# Patient Record
Sex: Female | Born: 1955 | Race: White | Hispanic: No | Smoking: Former smoker
Health system: Southern US, Community
[De-identification: ages and names within clinical notes are randomized; demographics above are authoritative.]

## PROBLEM LIST (undated history)

## (undated) DIAGNOSIS — D649 Anemia, unspecified: Secondary | ICD-10-CM

## (undated) DIAGNOSIS — M1990 Unspecified osteoarthritis, unspecified site: Secondary | ICD-10-CM

## (undated) DIAGNOSIS — L508 Other urticaria: Secondary | ICD-10-CM

## (undated) DIAGNOSIS — J4540 Moderate persistent asthma, uncomplicated: Secondary | ICD-10-CM

## (undated) DIAGNOSIS — J309 Allergic rhinitis, unspecified: Secondary | ICD-10-CM

## (undated) DIAGNOSIS — N393 Stress incontinence (female) (male): Secondary | ICD-10-CM

## (undated) DIAGNOSIS — G5600 Carpal tunnel syndrome, unspecified upper limb: Secondary | ICD-10-CM

## (undated) DIAGNOSIS — K8050 Calculus of bile duct without cholangitis or cholecystitis without obstruction: Secondary | ICD-10-CM

## (undated) DIAGNOSIS — K219 Gastro-esophageal reflux disease without esophagitis: Secondary | ICD-10-CM

## (undated) DIAGNOSIS — C801 Malignant (primary) neoplasm, unspecified: Secondary | ICD-10-CM

## (undated) DIAGNOSIS — T4145XA Adverse effect of unspecified anesthetic, initial encounter: Secondary | ICD-10-CM

## (undated) DIAGNOSIS — T8859XA Other complications of anesthesia, initial encounter: Secondary | ICD-10-CM

## (undated) DIAGNOSIS — K5730 Diverticulosis of large intestine without perforation or abscess without bleeding: Secondary | ICD-10-CM

## (undated) DIAGNOSIS — I499 Cardiac arrhythmia, unspecified: Secondary | ICD-10-CM

## (undated) DIAGNOSIS — Z85820 Personal history of malignant melanoma of skin: Secondary | ICD-10-CM

## (undated) DIAGNOSIS — Z860100 Personal history of colon polyps, unspecified: Secondary | ICD-10-CM

## (undated) DIAGNOSIS — IMO0002 Reserved for concepts with insufficient information to code with codable children: Secondary | ICD-10-CM

## (undated) DIAGNOSIS — R0600 Dyspnea, unspecified: Secondary | ICD-10-CM

## (undated) DIAGNOSIS — K760 Fatty (change of) liver, not elsewhere classified: Secondary | ICD-10-CM

## (undated) DIAGNOSIS — I209 Angina pectoris, unspecified: Secondary | ICD-10-CM

## (undated) DIAGNOSIS — Z973 Presence of spectacles and contact lenses: Secondary | ICD-10-CM

## (undated) DIAGNOSIS — Z87442 Personal history of urinary calculi: Secondary | ICD-10-CM

## (undated) DIAGNOSIS — I10 Essential (primary) hypertension: Secondary | ICD-10-CM

## (undated) DIAGNOSIS — N6019 Diffuse cystic mastopathy of unspecified breast: Secondary | ICD-10-CM

## (undated) DIAGNOSIS — Z9889 Other specified postprocedural states: Secondary | ICD-10-CM

## (undated) DIAGNOSIS — T7840XA Allergy, unspecified, initial encounter: Secondary | ICD-10-CM

## (undated) HISTORY — DX: Diffuse cystic mastopathy of unspecified breast: N60.19

## (undated) HISTORY — DX: Gastro-esophageal reflux disease without esophagitis: K21.9

## (undated) HISTORY — DX: Anemia, unspecified: D64.9

## (undated) HISTORY — DX: Allergy, unspecified, initial encounter: T78.40XA

## (undated) HISTORY — PX: KNEE ARTHROSCOPY W/ MENISCECTOMY: SHX1879

## (undated) HISTORY — PX: TOTAL KNEE REVISION: SHX996

## (undated) HISTORY — DX: Fatty (change of) liver, not elsewhere classified: K76.0

## (undated) HISTORY — PX: BREAST EXCISIONAL BIOPSY: SUR124

## (undated) HISTORY — PX: TUBAL LIGATION: SHX77

## (undated) HISTORY — PX: JOINT REPLACEMENT: SHX530

## (undated) HISTORY — PX: BREAST SURGERY: SHX581

---
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
## 1986-05-27 HISTORY — PX: VAGINAL HYSTERECTOMY: SUR661

## 1994-05-27 DIAGNOSIS — N6019 Diffuse cystic mastopathy of unspecified breast: Secondary | ICD-10-CM

## 1994-05-27 HISTORY — DX: Diffuse cystic mastopathy of unspecified breast: N60.19

## 1999-06-01 ENCOUNTER — Encounter: Payer: Self-pay | Admitting: Family Medicine

## 1999-06-01 ENCOUNTER — Encounter: Admission: RE | Admit: 1999-06-01 | Discharge: 1999-06-01 | Payer: Self-pay | Admitting: *Deleted

## 1999-08-31 ENCOUNTER — Encounter: Admission: RE | Admit: 1999-08-31 | Discharge: 1999-08-31 | Payer: Self-pay | Admitting: *Deleted

## 1999-09-28 ENCOUNTER — Encounter: Admission: RE | Admit: 1999-09-28 | Discharge: 1999-09-28 | Payer: Self-pay | Admitting: *Deleted

## 1999-10-02 ENCOUNTER — Ambulatory Visit (HOSPITAL_BASED_OUTPATIENT_CLINIC_OR_DEPARTMENT_OTHER): Admission: RE | Admit: 1999-10-02 | Discharge: 1999-10-02 | Payer: Self-pay | Admitting: *Deleted

## 2000-10-21 ENCOUNTER — Encounter: Payer: Self-pay | Admitting: Family Medicine

## 2000-10-21 ENCOUNTER — Encounter: Admission: RE | Admit: 2000-10-21 | Discharge: 2000-10-21 | Payer: Self-pay | Admitting: Family Medicine

## 2001-10-26 ENCOUNTER — Encounter: Payer: Self-pay | Admitting: Family Medicine

## 2001-10-26 ENCOUNTER — Encounter: Admission: RE | Admit: 2001-10-26 | Discharge: 2001-10-26 | Payer: Self-pay | Admitting: Family Medicine

## 2002-06-30 ENCOUNTER — Encounter: Payer: Self-pay | Admitting: Family Medicine

## 2002-06-30 ENCOUNTER — Ambulatory Visit (HOSPITAL_COMMUNITY): Admission: RE | Admit: 2002-06-30 | Discharge: 2002-06-30 | Payer: Self-pay | Admitting: Family Medicine

## 2003-09-09 ENCOUNTER — Ambulatory Visit (HOSPITAL_COMMUNITY): Admission: RE | Admit: 2003-09-09 | Discharge: 2003-09-09 | Payer: Self-pay | Admitting: Family Medicine

## 2004-05-02 ENCOUNTER — Ambulatory Visit (HOSPITAL_COMMUNITY): Admission: RE | Admit: 2004-05-02 | Discharge: 2004-05-02 | Payer: Self-pay | Admitting: Family Medicine

## 2004-10-08 ENCOUNTER — Encounter: Admission: RE | Admit: 2004-10-08 | Discharge: 2004-10-08 | Payer: Self-pay | Admitting: Family Medicine

## 2004-10-17 ENCOUNTER — Encounter: Admission: RE | Admit: 2004-10-17 | Discharge: 2004-10-17 | Payer: Self-pay | Admitting: Family Medicine

## 2004-12-28 ENCOUNTER — Encounter: Admission: RE | Admit: 2004-12-28 | Discharge: 2004-12-28 | Payer: Self-pay | Admitting: Family Medicine

## 2005-01-02 ENCOUNTER — Ambulatory Visit (HOSPITAL_COMMUNITY): Admission: RE | Admit: 2005-01-02 | Discharge: 2005-01-02 | Payer: Self-pay | Admitting: Family Medicine

## 2005-01-24 ENCOUNTER — Ambulatory Visit: Payer: Self-pay | Admitting: Orthopedic Surgery

## 2005-05-06 ENCOUNTER — Ambulatory Visit: Payer: Self-pay | Admitting: Orthopedic Surgery

## 2005-07-04 ENCOUNTER — Ambulatory Visit: Payer: Self-pay | Admitting: Orthopedic Surgery

## 2005-07-11 ENCOUNTER — Ambulatory Visit: Payer: Self-pay | Admitting: Internal Medicine

## 2005-07-11 ENCOUNTER — Ambulatory Visit (HOSPITAL_COMMUNITY): Admission: RE | Admit: 2005-07-11 | Discharge: 2005-07-11 | Payer: Self-pay | Admitting: Internal Medicine

## 2005-07-11 ENCOUNTER — Ambulatory Visit: Payer: Self-pay | Admitting: Orthopedic Surgery

## 2005-07-17 ENCOUNTER — Encounter (HOSPITAL_COMMUNITY): Admission: RE | Admit: 2005-07-17 | Discharge: 2005-08-16 | Payer: Self-pay | Admitting: Internal Medicine

## 2005-07-17 ENCOUNTER — Ambulatory Visit: Payer: Self-pay | Admitting: Orthopedic Surgery

## 2005-08-08 ENCOUNTER — Ambulatory Visit (HOSPITAL_COMMUNITY): Admission: RE | Admit: 2005-08-08 | Discharge: 2005-08-08 | Payer: Self-pay | Admitting: Internal Medicine

## 2005-08-08 ENCOUNTER — Ambulatory Visit: Payer: Self-pay | Admitting: Internal Medicine

## 2005-09-03 ENCOUNTER — Emergency Department (HOSPITAL_COMMUNITY): Admission: EM | Admit: 2005-09-03 | Discharge: 2005-09-03 | Payer: Self-pay | Admitting: Emergency Medicine

## 2005-09-26 ENCOUNTER — Ambulatory Visit: Payer: Self-pay | Admitting: Orthopedic Surgery

## 2006-08-18 ENCOUNTER — Ambulatory Visit (HOSPITAL_COMMUNITY): Admission: RE | Admit: 2006-08-18 | Discharge: 2006-08-18 | Payer: Self-pay | Admitting: Family Medicine

## 2006-11-03 ENCOUNTER — Ambulatory Visit: Payer: Self-pay | Admitting: Orthopedic Surgery

## 2006-12-04 ENCOUNTER — Ambulatory Visit: Payer: Self-pay | Admitting: Orthopedic Surgery

## 2006-12-08 ENCOUNTER — Ambulatory Visit (HOSPITAL_COMMUNITY): Admission: RE | Admit: 2006-12-08 | Discharge: 2006-12-08 | Payer: Self-pay | Admitting: Orthopedic Surgery

## 2006-12-17 ENCOUNTER — Ambulatory Visit: Payer: Self-pay | Admitting: Orthopedic Surgery

## 2006-12-19 ENCOUNTER — Ambulatory Visit (HOSPITAL_COMMUNITY): Admission: RE | Admit: 2006-12-19 | Discharge: 2006-12-19 | Payer: Self-pay | Admitting: Orthopedic Surgery

## 2006-12-19 ENCOUNTER — Ambulatory Visit: Payer: Self-pay | Admitting: Orthopedic Surgery

## 2006-12-23 ENCOUNTER — Ambulatory Visit: Payer: Self-pay | Admitting: Orthopedic Surgery

## 2006-12-23 ENCOUNTER — Encounter (HOSPITAL_COMMUNITY): Admission: RE | Admit: 2006-12-23 | Discharge: 2007-01-22 | Payer: Self-pay | Admitting: Orthopedic Surgery

## 2007-01-12 ENCOUNTER — Ambulatory Visit: Payer: Self-pay | Admitting: Orthopedic Surgery

## 2007-01-19 ENCOUNTER — Ambulatory Visit: Payer: Self-pay | Admitting: Orthopedic Surgery

## 2007-02-02 ENCOUNTER — Ambulatory Visit: Payer: Self-pay | Admitting: Orthopedic Surgery

## 2007-02-02 DIAGNOSIS — IMO0002 Reserved for concepts with insufficient information to code with codable children: Secondary | ICD-10-CM | POA: Insufficient documentation

## 2007-02-02 DIAGNOSIS — M25569 Pain in unspecified knee: Secondary | ICD-10-CM | POA: Insufficient documentation

## 2007-02-05 ENCOUNTER — Encounter (HOSPITAL_COMMUNITY): Admission: RE | Admit: 2007-02-05 | Discharge: 2007-02-24 | Payer: Self-pay | Admitting: Orthopedic Surgery

## 2007-02-11 ENCOUNTER — Ambulatory Visit: Payer: Self-pay | Admitting: Orthopedic Surgery

## 2007-02-23 ENCOUNTER — Ambulatory Visit: Payer: Self-pay | Admitting: Orthopedic Surgery

## 2007-02-24 ENCOUNTER — Encounter: Payer: Self-pay | Admitting: Orthopedic Surgery

## 2007-03-09 ENCOUNTER — Encounter: Payer: Self-pay | Admitting: Orthopedic Surgery

## 2007-03-09 DIAGNOSIS — IMO0002 Reserved for concepts with insufficient information to code with codable children: Secondary | ICD-10-CM | POA: Insufficient documentation

## 2007-03-09 DIAGNOSIS — M1710 Unilateral primary osteoarthritis, unspecified knee: Secondary | ICD-10-CM

## 2007-03-10 ENCOUNTER — Encounter: Payer: Self-pay | Admitting: Orthopedic Surgery

## 2007-03-10 ENCOUNTER — Ambulatory Visit: Payer: Self-pay | Admitting: Orthopedic Surgery

## 2007-03-10 ENCOUNTER — Inpatient Hospital Stay (HOSPITAL_COMMUNITY): Admission: RE | Admit: 2007-03-10 | Discharge: 2007-03-13 | Payer: Self-pay | Admitting: Orthopedic Surgery

## 2007-03-10 HISTORY — PX: TOTAL KNEE ARTHROPLASTY: SHX125

## 2007-03-17 ENCOUNTER — Telehealth: Payer: Self-pay | Admitting: Orthopedic Surgery

## 2007-03-18 ENCOUNTER — Telehealth: Payer: Self-pay | Admitting: Orthopedic Surgery

## 2007-03-24 ENCOUNTER — Ambulatory Visit: Payer: Self-pay | Admitting: Orthopedic Surgery

## 2007-03-24 DIAGNOSIS — M1710 Unilateral primary osteoarthritis, unspecified knee: Secondary | ICD-10-CM | POA: Insufficient documentation

## 2007-03-27 ENCOUNTER — Encounter (HOSPITAL_COMMUNITY): Admission: RE | Admit: 2007-03-27 | Discharge: 2007-04-26 | Payer: Self-pay | Admitting: Orthopedic Surgery

## 2007-03-27 ENCOUNTER — Encounter: Payer: Self-pay | Admitting: Orthopedic Surgery

## 2007-03-31 ENCOUNTER — Encounter: Payer: Self-pay | Admitting: Orthopedic Surgery

## 2007-04-08 ENCOUNTER — Encounter: Payer: Self-pay | Admitting: Orthopedic Surgery

## 2007-04-16 ENCOUNTER — Encounter: Payer: Self-pay | Admitting: Orthopedic Surgery

## 2007-04-21 ENCOUNTER — Encounter: Payer: Self-pay | Admitting: Orthopedic Surgery

## 2007-04-22 ENCOUNTER — Ambulatory Visit: Payer: Self-pay | Admitting: Orthopedic Surgery

## 2007-04-25 ENCOUNTER — Encounter: Payer: Self-pay | Admitting: Orthopedic Surgery

## 2007-04-29 ENCOUNTER — Ambulatory Visit: Admission: RE | Admit: 2007-04-29 | Discharge: 2007-04-29 | Payer: Self-pay | Admitting: Orthopedic Surgery

## 2007-06-03 ENCOUNTER — Ambulatory Visit: Payer: Self-pay | Admitting: Orthopedic Surgery

## 2007-06-25 ENCOUNTER — Ambulatory Visit: Payer: Self-pay | Admitting: Orthopedic Surgery

## 2007-06-25 DIAGNOSIS — Z96659 Presence of unspecified artificial knee joint: Secondary | ICD-10-CM | POA: Insufficient documentation

## 2007-06-29 ENCOUNTER — Ambulatory Visit: Payer: Self-pay | Admitting: Orthopedic Surgery

## 2007-07-09 ENCOUNTER — Ambulatory Visit: Payer: Self-pay | Admitting: Orthopedic Surgery

## 2007-07-09 DIAGNOSIS — IMO0002 Reserved for concepts with insufficient information to code with codable children: Secondary | ICD-10-CM | POA: Insufficient documentation

## 2007-07-10 ENCOUNTER — Encounter: Payer: Self-pay | Admitting: Orthopedic Surgery

## 2007-07-14 ENCOUNTER — Telehealth: Payer: Self-pay | Admitting: Orthopedic Surgery

## 2007-07-20 ENCOUNTER — Encounter: Payer: Self-pay | Admitting: Orthopedic Surgery

## 2007-08-05 ENCOUNTER — Ambulatory Visit: Payer: Self-pay | Admitting: Orthopedic Surgery

## 2007-08-07 ENCOUNTER — Telehealth: Payer: Self-pay | Admitting: Orthopedic Surgery

## 2007-08-11 ENCOUNTER — Encounter: Payer: Self-pay | Admitting: Orthopedic Surgery

## 2007-08-18 ENCOUNTER — Telehealth: Payer: Self-pay | Admitting: Orthopedic Surgery

## 2007-08-28 ENCOUNTER — Telehealth: Payer: Self-pay | Admitting: Orthopedic Surgery

## 2007-08-28 ENCOUNTER — Ambulatory Visit (HOSPITAL_COMMUNITY): Admission: RE | Admit: 2007-08-28 | Discharge: 2007-08-28 | Payer: Self-pay | Admitting: Orthopedic Surgery

## 2007-09-01 ENCOUNTER — Encounter: Payer: Self-pay | Admitting: Orthopedic Surgery

## 2007-09-01 DIAGNOSIS — M5126 Other intervertebral disc displacement, lumbar region: Secondary | ICD-10-CM | POA: Insufficient documentation

## 2007-09-09 ENCOUNTER — Encounter: Admission: RE | Admit: 2007-09-09 | Discharge: 2007-09-09 | Payer: Self-pay | Admitting: Orthopedic Surgery

## 2007-09-23 ENCOUNTER — Encounter: Admission: RE | Admit: 2007-09-23 | Discharge: 2007-09-23 | Payer: Self-pay | Admitting: Orthopedic Surgery

## 2007-10-02 ENCOUNTER — Encounter: Payer: Self-pay | Admitting: Orthopedic Surgery

## 2007-10-05 ENCOUNTER — Ambulatory Visit (HOSPITAL_COMMUNITY): Admission: RE | Admit: 2007-10-05 | Discharge: 2007-10-05 | Payer: Self-pay | Admitting: Neurology

## 2007-10-09 ENCOUNTER — Encounter: Admission: RE | Admit: 2007-10-09 | Discharge: 2007-10-09 | Payer: Self-pay | Admitting: Orthopedic Surgery

## 2007-10-20 ENCOUNTER — Encounter: Payer: Self-pay | Admitting: Orthopedic Surgery

## 2007-10-21 ENCOUNTER — Encounter: Admission: RE | Admit: 2007-10-21 | Discharge: 2007-10-21 | Payer: Self-pay | Admitting: Family Medicine

## 2007-11-04 ENCOUNTER — Ambulatory Visit: Payer: Self-pay | Admitting: Orthopedic Surgery

## 2007-11-06 ENCOUNTER — Encounter: Payer: Self-pay | Admitting: Orthopedic Surgery

## 2007-11-09 ENCOUNTER — Telehealth: Payer: Self-pay | Admitting: Orthopedic Surgery

## 2008-01-15 ENCOUNTER — Ambulatory Visit (HOSPITAL_COMMUNITY): Admission: RE | Admit: 2008-01-15 | Discharge: 2008-01-15 | Payer: Self-pay | Admitting: Family Medicine

## 2008-01-22 ENCOUNTER — Ambulatory Visit (HOSPITAL_COMMUNITY): Admission: RE | Admit: 2008-01-22 | Discharge: 2008-01-22 | Payer: Self-pay | Admitting: Family Medicine

## 2008-03-03 ENCOUNTER — Telehealth: Payer: Self-pay | Admitting: Orthopedic Surgery

## 2008-03-09 ENCOUNTER — Ambulatory Visit: Payer: Self-pay | Admitting: Orthopedic Surgery

## 2008-07-05 ENCOUNTER — Telehealth: Payer: Self-pay | Admitting: Orthopedic Surgery

## 2008-07-13 ENCOUNTER — Encounter: Admission: RE | Admit: 2008-07-13 | Discharge: 2008-07-13 | Payer: Self-pay | Admitting: Orthopedic Surgery

## 2008-07-14 ENCOUNTER — Ambulatory Visit (HOSPITAL_COMMUNITY): Admission: RE | Admit: 2008-07-14 | Discharge: 2008-07-14 | Payer: Self-pay | Admitting: Family Medicine

## 2008-08-12 ENCOUNTER — Encounter: Admission: RE | Admit: 2008-08-12 | Discharge: 2008-08-12 | Payer: Self-pay | Admitting: Orthopedic Surgery

## 2008-08-18 ENCOUNTER — Telehealth: Payer: Self-pay | Admitting: Orthopedic Surgery

## 2008-08-19 ENCOUNTER — Encounter: Payer: Self-pay | Admitting: Orthopedic Surgery

## 2008-09-07 ENCOUNTER — Encounter: Admission: RE | Admit: 2008-09-07 | Discharge: 2008-09-07 | Payer: Self-pay | Admitting: Orthopedic Surgery

## 2008-09-16 ENCOUNTER — Encounter: Payer: Self-pay | Admitting: Orthopedic Surgery

## 2008-09-19 ENCOUNTER — Encounter: Payer: Self-pay | Admitting: Orthopedic Surgery

## 2008-11-08 ENCOUNTER — Encounter: Admission: RE | Admit: 2008-11-08 | Discharge: 2008-11-08 | Payer: Self-pay | Admitting: Family Medicine

## 2008-12-09 ENCOUNTER — Encounter: Payer: Self-pay | Admitting: Orthopedic Surgery

## 2008-12-21 ENCOUNTER — Encounter: Payer: Self-pay | Admitting: Orthopedic Surgery

## 2008-12-22 ENCOUNTER — Telehealth: Payer: Self-pay | Admitting: Orthopedic Surgery

## 2009-01-18 ENCOUNTER — Ambulatory Visit: Payer: Self-pay | Admitting: Orthopedic Surgery

## 2009-01-27 ENCOUNTER — Encounter: Payer: Self-pay | Admitting: Orthopedic Surgery

## 2009-02-07 ENCOUNTER — Encounter: Payer: Self-pay | Admitting: Orthopedic Surgery

## 2009-03-22 ENCOUNTER — Telehealth: Payer: Self-pay | Admitting: Orthopedic Surgery

## 2009-04-19 ENCOUNTER — Ambulatory Visit: Payer: Self-pay | Admitting: Orthopedic Surgery

## 2009-04-19 DIAGNOSIS — M25819 Other specified joint disorders, unspecified shoulder: Secondary | ICD-10-CM | POA: Insufficient documentation

## 2009-04-19 DIAGNOSIS — M25519 Pain in unspecified shoulder: Secondary | ICD-10-CM | POA: Insufficient documentation

## 2009-04-19 DIAGNOSIS — M7580 Other shoulder lesions, unspecified shoulder: Secondary | ICD-10-CM

## 2009-07-07 ENCOUNTER — Ambulatory Visit (HOSPITAL_COMMUNITY): Admission: RE | Admit: 2009-07-07 | Discharge: 2009-07-07 | Payer: Self-pay | Admitting: Family Medicine

## 2009-07-25 ENCOUNTER — Ambulatory Visit (HOSPITAL_COMMUNITY): Admission: RE | Admit: 2009-07-25 | Discharge: 2009-07-25 | Payer: Self-pay | Admitting: Family Medicine

## 2009-08-02 ENCOUNTER — Ambulatory Visit: Payer: Self-pay | Admitting: Orthopedic Surgery

## 2009-10-20 ENCOUNTER — Encounter: Payer: Self-pay | Admitting: Orthopedic Surgery

## 2009-10-27 ENCOUNTER — Encounter: Payer: Self-pay | Admitting: Orthopedic Surgery

## 2009-11-01 ENCOUNTER — Ambulatory Visit: Payer: Self-pay | Admitting: Orthopedic Surgery

## 2009-11-13 ENCOUNTER — Telehealth: Payer: Self-pay | Admitting: Orthopedic Surgery

## 2009-11-29 ENCOUNTER — Encounter: Admission: RE | Admit: 2009-11-29 | Discharge: 2009-11-29 | Payer: Self-pay | Admitting: Family Medicine

## 2009-12-01 ENCOUNTER — Telehealth: Payer: Self-pay | Admitting: Orthopedic Surgery

## 2009-12-01 ENCOUNTER — Encounter: Payer: Self-pay | Admitting: Orthopedic Surgery

## 2010-01-01 ENCOUNTER — Ambulatory Visit: Payer: Self-pay | Admitting: Orthopedic Surgery

## 2010-01-01 DIAGNOSIS — M23302 Other meniscus derangements, unspecified lateral meniscus, unspecified knee: Secondary | ICD-10-CM | POA: Insufficient documentation

## 2010-01-01 DIAGNOSIS — S53106A Unspecified dislocation of unspecified ulnohumeral joint, initial encounter: Secondary | ICD-10-CM | POA: Insufficient documentation

## 2010-01-02 ENCOUNTER — Telehealth: Payer: Self-pay | Admitting: Orthopedic Surgery

## 2010-01-02 ENCOUNTER — Encounter: Payer: Self-pay | Admitting: Orthopedic Surgery

## 2010-01-02 ENCOUNTER — Ambulatory Visit (HOSPITAL_COMMUNITY): Admission: RE | Admit: 2010-01-02 | Discharge: 2010-01-02 | Payer: Self-pay | Admitting: Orthopedic Surgery

## 2010-01-10 ENCOUNTER — Ambulatory Visit: Payer: Self-pay | Admitting: Orthopedic Surgery

## 2010-01-10 DIAGNOSIS — M2340 Loose body in knee, unspecified knee: Secondary | ICD-10-CM | POA: Insufficient documentation

## 2010-01-18 ENCOUNTER — Telehealth: Payer: Self-pay | Admitting: Orthopedic Surgery

## 2010-01-26 ENCOUNTER — Ambulatory Visit: Payer: Self-pay | Admitting: Orthopedic Surgery

## 2010-01-26 ENCOUNTER — Ambulatory Visit (HOSPITAL_COMMUNITY): Admission: RE | Admit: 2010-01-26 | Discharge: 2010-01-26 | Payer: Self-pay | Admitting: Orthopedic Surgery

## 2010-02-01 ENCOUNTER — Encounter (HOSPITAL_COMMUNITY): Admission: RE | Admit: 2010-02-01 | Discharge: 2010-02-23 | Payer: Self-pay | Admitting: Orthopedic Surgery

## 2010-02-01 ENCOUNTER — Ambulatory Visit: Payer: Self-pay | Admitting: Orthopedic Surgery

## 2010-02-15 ENCOUNTER — Ambulatory Visit: Payer: Self-pay | Admitting: Orthopedic Surgery

## 2010-02-15 DIAGNOSIS — M659 Synovitis and tenosynovitis, unspecified: Secondary | ICD-10-CM | POA: Insufficient documentation

## 2010-02-22 ENCOUNTER — Encounter: Payer: Self-pay | Admitting: Orthopedic Surgery

## 2010-02-26 ENCOUNTER — Encounter (HOSPITAL_COMMUNITY): Admission: RE | Admit: 2010-02-26 | Discharge: 2010-03-28 | Payer: Self-pay | Admitting: Orthopedic Surgery

## 2010-02-28 ENCOUNTER — Ambulatory Visit: Payer: Self-pay | Admitting: Orthopedic Surgery

## 2010-03-28 ENCOUNTER — Ambulatory Visit: Payer: Self-pay | Admitting: Orthopedic Surgery

## 2010-03-28 ENCOUNTER — Ambulatory Visit (HOSPITAL_COMMUNITY): Admission: RE | Admit: 2010-03-28 | Discharge: 2010-03-28 | Payer: Self-pay | Admitting: Orthopedic Surgery

## 2010-03-28 ENCOUNTER — Telehealth: Payer: Self-pay | Admitting: Orthopedic Surgery

## 2010-04-03 ENCOUNTER — Encounter: Payer: Self-pay | Admitting: Orthopedic Surgery

## 2010-04-11 ENCOUNTER — Ambulatory Visit: Payer: Self-pay | Admitting: Orthopedic Surgery

## 2010-04-11 ENCOUNTER — Ambulatory Visit (HOSPITAL_COMMUNITY): Admission: RE | Admit: 2010-04-11 | Discharge: 2010-04-11 | Payer: Self-pay | Admitting: Orthopedic Surgery

## 2010-04-11 DIAGNOSIS — M84369A Stress fracture, unspecified tibia and fibula, initial encounter for fracture: Secondary | ICD-10-CM | POA: Insufficient documentation

## 2010-04-12 ENCOUNTER — Telehealth: Payer: Self-pay | Admitting: Orthopedic Surgery

## 2010-04-12 ENCOUNTER — Encounter (INDEPENDENT_AMBULATORY_CARE_PROVIDER_SITE_OTHER): Payer: Self-pay | Admitting: *Deleted

## 2010-04-18 ENCOUNTER — Encounter: Admission: RE | Admit: 2010-04-18 | Discharge: 2010-04-18 | Payer: Self-pay | Admitting: Orthopedic Surgery

## 2010-06-17 ENCOUNTER — Encounter: Payer: Self-pay | Admitting: Family Medicine

## 2010-06-18 ENCOUNTER — Encounter: Payer: Self-pay | Admitting: Family Medicine

## 2010-06-25 ENCOUNTER — Other Ambulatory Visit: Payer: Self-pay | Admitting: Orthopedic Surgery

## 2010-06-25 DIAGNOSIS — M549 Dorsalgia, unspecified: Secondary | ICD-10-CM

## 2010-06-28 NOTE — Assessment & Plan Note (Signed)
Summary: RT KNEE PAIN/?NEW XRAY/MEDICARE(REC'D NEW CARD)/CAF   Visit Type:  Follow-up  CC:  right knee pain.  History of Present Illness: I saw Sharon [name] in the office today for a followup visit.  She is a 55 years old woman with the complaint of:  right knee pain  She has a history of osteoarthritis in that knee she had an injection back in June.  She said it didn't help that much.  Back in 2006 she had a MRI which showed arthritis of the patellofemoral joint no meniscal tear.  She had Synvisc injections in 2007.  She status post LEFT total knee.  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  She has catching and some locking and anterior medial knee pain which is now not relieved by Vicodin or ibuprofen      Allergies: 1)  ! Neosporin  Past History:  Past Medical History: Last updated: 01/18/2009 GERD Pneumonia migraines melanoma  Past Surgical History: Last updated: 01/18/2009 Appendectomy Tonsillectomy HYSTERECTOMY left knee total 2008.  Family History: Last updated: 03/09/2007 FH of Cancer:  Lung Disease  Social History: Last updated: 03/09/2007 Patient is single.  RN a [employer]  Risk Factors: Smoking Status: current (03/09/2007) Packs/Day: 1/2 (03/09/2007)  Review of Systems       chronic back pain  Chronic pain    Physical Exam  Additional Exam:  1. General appearance was normal  2. Oriented x 3  3. Mood was normal  4. Gait was abnormal she was using a cane and limping on the RIGHT leg  RIGHT knee 5. Inspection no swelling medial joint line tenderness inferior patella tenderness, no tenderness behind the knee joint 6. ROM was 5-125 7. Joint stability was normal 8. Motor grade was normal 9. Skin was normal 10. Pulse was normal  11. Sensation was normal  12. Reflexes were normal    Impression & Recommendations:  Problem # 1:  KNEE, ARTHRITIS,  DEGEN./OSTEO (ICD-715.96) Assessment New  Her updated medication list for this problem includes:    Vicodin 5-500 Mg Tabs (Hydrocodone-acetaminophen) ..... One by mouth q 4 hrs as needed pain    Ultram 50 Mg Tabs (Tramadol hcl) .Marland Kitchen... 1-2 by mouth q 4 as needed pain  Orders: Est. Patient Level IV (81191) Knee x-ray,  3 views (47829) RIGHT knee  Moderate joint space narrowing medial osteophytes and patellofemoral arthritis are seen overall alignment slight varus  Impression moderate osteoarthritis  Problem # 2:  KNEE PAIN (FAO-130.86) Assessment: New  Her updated medication list for this problem includes:    Vicodin 5-500 Mg Tabs (Hydrocodone-acetaminophen) ..... One by mouth q 4 hrs as needed pain    Ultram 50 Mg Tabs (Tramadol hcl) .Marland Kitchen... 1-2 by mouth q 4 as needed pain  Orders: Est. Patient Level IV (57846) Knee x-ray,  3 views (96295)  Problem # 3:  DERANGEMENT MENISCUS (ICD-717.5) Assessment: New recommend MRI RIGHT knee for meniscal tear, medial  Patient Instructions: 1)  MRI RIGHT KNEE 2)  F/U AFTER

## 2010-06-28 NOTE — Assessment & Plan Note (Signed)
Summary: REVIEW MRI RESULTS,[facility]/ANKLE/MEDICARE/CAF   Visit Type:  Follow-up  CC:  recheck ankle pain.  History of Present Illness: DX: posterior tibial tendinitis.  Treatment Cam Walker, Vicodin as needed for pain Motrin 800mg  as needed, Physical therapy.  Medications as stated.  Patient complained of pain in her medial leg, and tibial area, and sent for MRI does show she has a stress reaction/stress fracture.  Soft complaint of back pain secondary to RIGHT Cam Walker.  She says her back pain is severe. She had an epidural injection about 2 years ago and last MRI was 2 years ago. His bilateral leg pain radicular in nature. Not responding to anti-inflammatories, ibuprofen or Vicodin.  Today is here for MRI results right lower leg from [facility] 03/28/10. IMPRESSION:   1.  There is no evidence of tendon tear or focal soft tissue abnormality distally in the right lower leg to correspond with the patient's palpable concern.  Pretibial subcutaneous edema appears fairly symmetric in both lower legs. 2.  In the right lower leg, there is marrow edema in the distal tibial diaphysis which could reflect stress reaction or early stress fracture.  No cortical abnormality is apparent. 3.  Asymmetric edema in the right gastrocnemius muscle with associated atrophy, most consistent with subacute denervation. Correlate clinically. 4.  Mildly asymmetric edema surrounding the right anterior talofibular ligament, not optimally evaluated.   Pt note for review in EMR.              Allergies: 1)  ! Neosporin   Impression & Recommendations:  Problem # 1:  TENOSYNOVITIS OF FOOT AND ANKLE (ICD-727.06) Assessment Improved  use Cane left hand x 6-8 weeks   Orders: Est. Patient Level III (04540)  Problem # 2:  STRESS FRACTURE, TIBIA (ICD-733.93) Assessment: New  Orders: Est. Patient Level III (98119)  Patient Instructions: 1)  MRI L spine call results  2)  will arrange ESI after  MRI 3)  Use cane land / for Stress reaction right tibia   Orders Added: 1)  Est. Patient Level III [14782]

## 2010-06-28 NOTE — Miscellaneous (Signed)
Summary: PT clinical evaluation  PT clinical evaluation   Imported By: Jacklynn Ganong 02/22/2010 12:19:29  _____________________________________________________________________  External Attachment:    Type:   Image     Comment:   External Document

## 2010-06-28 NOTE — Letter (Signed)
Summary: [employer] Disability forms  [employer] Disability forms   Imported By: Cammie Sickle 10/31/2009 11:42:15  _____________________________________________________________________  External Attachment:    Type:   Image     Comment:   External Document

## 2010-06-28 NOTE — Assessment & Plan Note (Signed)
Summary: 2 WK RE-CK ANKLE+TENDON/?XRAY/POST OP RT KNEE/ARTHROS 01/26/10/...   Visit Type:  Follow-up  CC:  recheck ankle.  History of Present Illness: I saw Sharon [name] in the office today for a followup visit.  She is a 55 years old woman with the complaint of:  right leg pain  DOS  01-26-10. Arthroscopy of right knee.  Medications: Hydrocodone 7.5 mg and Ibuprofen, no refills needed.  Patient states her ankle started hurting last week and having pain going up  her calf muscle.  ROS: the knee is fine   Today is 2 week recheck on the ankle.TENOSYNOVITIS OF FOOT AND ANKLE (ICD-727.06)  She still has pain right lower inner leg, no pain in the knee,   She is undergoing physical therapy for her RIGHT ankle still having medial tibial and ankle pain  Exam shows the following she ambulates with minimal limp but she has severe tenderness over the medial posterior tibia in the medial arch.  Ankle range of motion is normal.  She has weakness in the posterior tibial tendon.  Ankle is stable his sensation is normal.  Skin is intact      Allergies: 1)  ! Neosporin   Impression & Recommendations:  Problem # 1:  TENOSYNOVITIS OF FOOT AND ANKLE (ICD-727.06) Assessment Unchanged recommend physical therapy and a Cam Walker for one month in followup.  Patient advised to keep her weightbearing to a minimum because the uneven gait caused by the Cam Walker and may cause increased back pain Orders: Est. Patient Level III (16109)  Patient Instructions: 1)  CAM WALKER X 1 MONTH  2)  KEEP YOUR WALKING TO A MINIMUM, DO NOT GO FOR LONG SHOPPING TRIPS OR LONG PERIODS OF STANDING  3)  CONTINUE PT  4)  RETURN IN 1 MONTH

## 2010-06-28 NOTE — Assessment & Plan Note (Signed)
Summary: POST OP 1/RT KNEE/SURG 01/26/10/MEDICARE/CAF   Visit Type:  post op #1  CC:  right knee.  History of Present Illness: I saw Sharon [name] in the office today for a followup visit.  She is a 55 years old woman with the complaint of:  post op #1, right knee  DOS  01-26-10. Arthroscopy of right knee.  Medications: Hydrocodone 7.5 mg and Ibuprofen.  OPERATIVE FINDINGS:  She had a torn medial meniscus.  She had a torn lateral meniscus.  She had grade 4 lesion on the lateral tibial plateau and a grade 3 lesion of the medial femoral condyle, and the trochlea, and a grade 2 lesion of the patella with extensive fissuring.  There was extensive synovitis.  The patient says that her knee is 80% better.  She can move the knee much better she has much less pain no swelling suture line looks good on both portals.  We can take the sutures out today.  She will followup for checkup after therapy which will start tomorrow or today I think it is.  She should continue same medicines.  Allergies: 1)  ! Neosporin   Impression & Recommendations:  Problem # 1:  KNEE, ARTHRITIS, DEGEN./OSTEO (ICD-715.96)  Her updated medication list for this problem includes:    Vicodin 5-500 Mg Tabs (Hydrocodone-acetaminophen) ..... One by mouth q 4 hrs as needed pain    Ultram 50 Mg Tabs (Tramadol hcl) .Marland Kitchen... 1-2 by mouth q 4 as needed pain  Orders: Post-Op Check (52841)  Problem # 2:  DERANGEMENT MENISCUS (ICD-717.5)  Orders: Post-Op Check (32440)  Patient Instructions: 1)  Please schedule a follow-up appointment in 3 weeks.

## 2010-06-28 NOTE — Progress Notes (Signed)
Summary: additiional [employer] form to be filled out  Phone Note Call from Patient   Caller: Patient Summary of Call: Per phone and new form rec'd 11/09/09 per patient, [employer] did not accept the standard disability form by [employer], our forms provider.   Appointment has therefore been scheduled for patient to have re-eval by Dr Romeo Apple for completion of requested form, per patient's claims examiner Carmelina Peal. Per Natalia Leatherwood, form needed before 12/08/09, for the month of July; states she has told patient she's been appr'd for month of June. Initial call taken by: Cammie Sickle,  November 13, 2009 10:27 AM

## 2010-06-28 NOTE — Progress Notes (Signed)
Summary: Referral to [facility] for ESI's.  Phone Note Outgoing Call   Call placed by: Waldon Reining,  April 12, 2010 4:48 PM Call placed to: Specialist Action Taken: Information Sent Summary of Call: I faxed a referral for this patient to [facility] for ESI's at L5-S1.

## 2010-06-28 NOTE — Miscellaneous (Signed)
Summary: PT progress note  PT progress note   Imported By: Jacklynn Ganong 02/22/2010 15:56:51  _____________________________________________________________________  External Attachment:    Type:   Image     Comment:   External Document

## 2010-06-28 NOTE — Progress Notes (Signed)
Summary: no pre-cert required for out-patient surgery  Phone Note Outgoing Call   Summary of Call: Per Medicare, no pre-authorization required for out-pt surgery, CPT 29881 scheduled 9/2/11at [facility]. Initial call taken by: Cammie Sickle,  January 18, 2010 3:47 PM

## 2010-06-28 NOTE — Progress Notes (Signed)
Summary: MRI appointment.  Phone Note Outgoing Call   Call placed by: Waldon Reining,  January 02, 2010 10:37 AM Call placed to: Patient Action Taken: Appt scheduled Summary of Call: I called to give the patient her MRI appointment at [facility] on 01-03-10 at 1:30. Patient has Medicare, no precert is needed. Patient will follow up here for results.

## 2010-06-28 NOTE — Miscellaneous (Signed)
Summary: L5-S1 ESI order  Clinical Lists Changes  Orders: Added new Referral order of Misc. Referral (Misc. Ref) - Signed

## 2010-06-28 NOTE — Assessment & Plan Note (Signed)
Summary: 1 M RE-CHECK RT ANKLE/MEDICARE/CAF   Visit Type:  Follow-up  CC:  recheck rt ankle.  History of Present Illness: DX:one posterior tibial tendinitis.  Treatment Cam Walker, Vicodin as needed for pain. Pain as well. Physical therapy.  Medications as stated.  Complaint medial leg pain, tibialis anterior, and medial lower leg.  Today, skilled for recheck and review of PT notes.  PT notes state that the patient has improved somewhat, but the patient still complains of significant pain.  Recommend MRI RIGHT leg.            Allergies: 1)  ! Neosporin  Physical Exam  Additional Exam:  RIGHT leg is tender over the medial posterior tibia, and soft tissues, including the tibialis anterior, mainly involving the distal 3rd of the leg.  She is having some bilateral ankle edema, which is nonpitting.     Impression & Recommendations:  Problem # 1:  TENOSYNOVITIS OF FOOT AND ANKLE (ICD-727.06) Assessment Unchanged  MRI of the RIGHT leg  Orders: Est. Patient Level II (29562)  Patient Instructions: 1)  MRI right leg  2)  Lets take the boot off  Prescriptions: VICODIN 5-500 MG TABS (HYDROCODONE-ACETAMINOPHEN) one by mouth q 4 hrs as needed pain  #84 x 5   Entered and Authorized by:   Fuller Canada MD   Signed by:   Fuller Canada MD on 03/28/2010   Method used:   Print then Give to Patient   RxID:   [id number]    Orders Added: 1)  Est. Patient Level II [84132]

## 2010-06-28 NOTE — Assessment & Plan Note (Signed)
Summary: 3 M RE-CK KNEE FOL'G INJEC/SELF PAY/CAF   Visit Type:  Follow-up  CC:  right knee pain .  History of Present Illness: I saw Sharon [name] in the office today for a followup visit.  She is a 55 years old woman with the complaint of:  knee pain  DX:  Arthritis, pain   Treatment: pain management, had left TKA, had ESI's for left leg radicular pain and right knee in jection for OA   MEDS: Flector and Lidoderm patches-not taking it cost too much, Ibuprofen 1200 mg two times a day [advised to stop doing that]   [prescribed Neurontin]; doxepin taking that   takes vicodin for migraines    Allergies: 1)  ! Neosporin   Impression & Recommendations:  Problem # 1:  OSTEOARTHROSIS, LOCAL, PRIMARY, LOWER LEG (ICD-715.16)  RIGHT knee arthritis, RIGHT knee  Verbal consent was obtained. The knee was prepped with alcohol and ethyl chloride. 1 cc of depomedrol 40mg /cc and 4 cc of lidocaine 1% was injected. there were no complications.  Orders: Joint Aspirate / Injection, Large (20610) Depo- Medrol 40mg  (J1030)  She would like an injection today in the RIGHT knee since it did so well last time there  Patient Instructions: 1)  Please schedule a follow-up appointment in 3 months. 2)  You have received an injection of cortisone today. You may experience increased pain at the injection site. Apply ice pack to the area for 20 minutes every 2 hours and take 2 xtra strength tylenol every 8 hours. This increased pain will usually resolve in 24 hours. The injection will take effect in 3-10 days.

## 2010-06-28 NOTE — Progress Notes (Signed)
Summary: Notified patient [employer]G Eval form faxed  Phone Note Outgoing Call   Call placed to: Patient Summary of Call: Notified patient ING form (Physical Capacities Evaluation) faxed to [facility], AttnMarquis Lunch, who is handling claim to FAX [phone],  Ph [phone], X [phone].  Ref [id number]. Initial call taken by: Cammie Sickle,  December 01, 2009 5:10 PM

## 2010-06-28 NOTE — Assessment & Plan Note (Signed)
Summary: MRI results from [facility]/ bringing films/frs   Visit Type:  Follow-up  CC:  right knee pain.  History of Present Illness: I saw Sharon [name] in the office today for a followup visit.  She is a 55 years old woman with the complaint of:  pain severe RIGHT knee  Patient had injections, she is on some pain medication and Ultram as well continued to have significant pain in her RIGHT knee.  She did have a fall sometime last winter he would never really got better.  Injection didn't work.  We sent her for an MRI showed progressive 3 compartment arthritis, tear medial meniscus, joint effusion, Baker cyst, loose bodies.bursitis medially.  Medications:Vicodin 5-500 Mg Tabs (Hydrocodone-acetaminophen) ..... One by mouth q 4 hrs as needed pain    Ultram 50 Mg Tabs (Tramadol hcl) .Marland Kitchen... 1-2 by mouth q 4 as needed pain no ALLERGIES   I recommended surgical treatment with the alternative of knee replacement vs. nonoperative treatment she says she can no longer go on like this.  She says she can't sleep.  Allergies: 1)  ! Neosporin  Past History:  Past Medical History: Last updated: 01/18/2009 GERD Pneumonia migraines melanoma  Past Surgical History: Last updated: 01/18/2009 Appendectomy Tonsillectomy HYSTERECTOMY left knee total 2008.  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   Impression & Recommendations:  Problem # 1:  KNEE, ARTHRITIS, DEGEN./OSTEO (ICD-715.96) Assessment Comment Only MRI report reviewed with the MRI. MRI reviewed.  Recommend arthroscopy RIGHT knee, partial medial meniscectomy, debridement, removal loose bodies.  Her updated medication list for this problem includes:  Vicodin 5-500 Mg Tabs (Hydrocodone-acetaminophen) ..... One by mouth q 4 hrs as needed pain    Ultram 50 Mg Tabs (Tramadol hcl) .Marland Kitchen... 1-2 by mouth q 4 as needed pain  Orders: Est. Patient Level III (16109)  Problem # 2:  DERANGEMENT MENISCUS (ICD-717.5) Assessment: Comment Only  Orders: Est. Patient Level III (60454)  Problem # 3:  LOOSE BODY-KNEE (ICD-717.6) Assessment: Deteriorated  Orders: Est. Patient Level III (09811)

## 2010-06-28 NOTE — Letter (Signed)
Summary: [facility]G form Physical capacities eval  ING form Physical capacities eval   Imported By: Cammie Sickle 12/04/2009 13:21:10  _____________________________________________________________________  External Attachment:    Type:   Image     Comment:   External Document

## 2010-06-28 NOTE — Letter (Signed)
Summary: surgery order RT knee sched 01/26/10  surgery order RT knee sched 01/26/10   Imported By: Cammie Sickle 01/10/2010 18:49:04  _____________________________________________________________________  External Attachment:    Type:   Image     Comment:   External Document

## 2010-06-28 NOTE — Assessment & Plan Note (Signed)
Summary: SEVERE PAIN RT LEG/surg 01/26/10/mcr/bsf   Visit Type:  Follow-up  CC:  right leg pain.  History of Present Illness: I saw Sharon [name] in the office today for a followup visit.  She is a 55 years old woman with the complaint of:  right leg pain  DOS  01-26-10. Arthroscopy of right knee.  Medications: Hydrocodone 7.5 mg and Ibuprofen.  Patient states her ankle started hurting last week and having pain going up  her calf muscle.  ROS: the knee is fine       Allergies: 1)  ! Neosporin  Physical Exam  Additional Exam:  The knee looks good:  The ankle is tender along the POST. TIB. TENDON  She is having pain with inversion and weakness with inversion    Impression & Recommendations:  Problem # 1:  TENOSYNOVITIS OF FOOT AND ANKLE (ICD-727.06) Assessment New  Orders: Est. Patient Level II (54098)  Patient Instructions: 1)  Please schedule a follow-up appointment in 2 weeks. 2)  check ankle/ tendon  3)  PT  posterior tibial tendon ? tear - passive stretching , cryotherapy

## 2010-06-28 NOTE — Progress Notes (Signed)
Summary: patient request MRI order fax today; no pre-cert required  Phone Note Call from Patient   Caller: Daughter Summary of Call: Rec'd call from patient/daughter [facility] inquiring about the MRI order, states to be faxed today to [facility] radiology, as patient going out of town tomorrow and has already spoken to Tsaile at radiology dept about scheduling the MRI for this evening. Initial call taken by: Cammie Sickle,  March 28, 2010 5:18 PM  Follow-up for Phone Call        Per nurse and per Dr Romeo Apple, fax, as no pre-cert required for Medicare. Faxed at 4:55pm to [phone].  I called patient back to notify.  States she has an appointment this evening. Follow-up by: Cammie Sickle,  March 28, 2010 5:20 PM

## 2010-06-28 NOTE — Miscellaneous (Signed)
Summary: PT progress note  PT progress note   Imported By: Jacklynn Ganong 04/03/2010 16:20:05  _____________________________________________________________________  External Attachment:    Type:   Image     Comment:   External Document

## 2010-06-28 NOTE — Assessment & Plan Note (Signed)
Summary: 3 mo reck knees after injection/bsf   Visit Type:  Follow-up  CC:  bilateral knees.  History of Present Illness: 55 year old female status post LEFT total knee arthroplasty with history of postoperative developing symptomatic lumbar spine disease, chronic pain.  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  She did see the pain clinic specialist but because of cost and lack of insurance she stopped going.  during her last visit she had an injection in the RIGHT shoulder for impingement and in the RIGHT knee for symptomatic arthritis.  She got excellent relief from this.  She presents now for repeat injections  Foward elevation of the RIGHT arm reveals some weakness but most of this seems to be muscle guarding.  Passive range of motion was normal she had a normal drop test  Allergies: 1)  ! Neosporin   Impression & Recommendations:  Problem # 1:  IMPINGEMENT SYNDROME (ICD-726.2) Assessment Deteriorated  right knee injection   Verbal consent was obtained. The knee was prepped with alcohol and ethyl chloride. 1 cc of depomedrol 40mg /cc and 4 cc of lidocaine 1% was injected. there were no complications.   right shoulder injection  Verbal consent obtained/The shoulder was injected with depomedrol 40mg /cc and sensorcaine .25% . There were no complications note I did have trouble injecting her RIGHT shoulder, I did it twice  Orders: Joint Aspirate / Injection, Large (20610) Depo- Medrol 40mg  (J1030)  Problem # 2:  OSTEOARTHROSIS, LOCAL, PRIMARY, LOWER LEG (ICD-715.16) Assessment: Deteriorated  Orders: Joint Aspirate / Injection, Large (20610) Depo- Medrol 40mg  (J1030)  Patient Instructions: 1)  You have received an injection of cortisone today. You may experience increased pain at the injection site. Apply ice pack to the area for 20 minutes every 2 hours and take 2 xtra strength tylenol every 8  hours. This increased pain will usually resolve in 24 hours. The injection will take effect in 3-10 days.  2)  return in 3 months to repeat injection

## 2010-06-28 NOTE — Op Note (Signed)
Summary: Op Report updated RT knee  Op Report updated RT knee   Imported By: Cammie Sickle 02/07/2010 18:14:06  _____________________________________________________________________  External Attachment:    Type:   Image     Comment:   External Document

## 2010-06-28 NOTE — Letter (Signed)
Summary: Medical record req ING disability insurance  Medical record req [employer] disability insurance   Imported By: Cammie Sickle 11/02/2009 16:29:15  _____________________________________________________________________  External Attachment:    Type:   Image     Comment:   External Document

## 2010-06-29 ENCOUNTER — Ambulatory Visit
Admission: RE | Admit: 2010-06-29 | Discharge: 2010-06-29 | Disposition: A | Discharge status: Home / Self Care | Payer: Medicare Other | Source: Ambulatory Visit | Attending: Orthopedic Surgery | Admitting: Orthopedic Surgery

## 2010-06-29 DIAGNOSIS — M549 Dorsalgia, unspecified: Secondary | ICD-10-CM

## 2010-07-04 ENCOUNTER — Ambulatory Visit (INDEPENDENT_AMBULATORY_CARE_PROVIDER_SITE_OTHER): Payer: Medicare Other | Admitting: Orthopedic Surgery

## 2010-07-04 ENCOUNTER — Encounter: Payer: Self-pay | Admitting: Orthopedic Surgery

## 2010-07-04 DIAGNOSIS — M2340 Loose body in knee, unspecified knee: Secondary | ICD-10-CM

## 2010-07-04 DIAGNOSIS — Z96659 Presence of unspecified artificial knee joint: Secondary | ICD-10-CM

## 2010-07-06 ENCOUNTER — Encounter (INDEPENDENT_AMBULATORY_CARE_PROVIDER_SITE_OTHER): Payer: Self-pay | Admitting: *Deleted

## 2010-07-09 ENCOUNTER — Other Ambulatory Visit: Payer: Self-pay | Admitting: Orthopedic Surgery

## 2010-07-09 ENCOUNTER — Encounter (HOSPITAL_COMMUNITY): Payer: Medicare Other

## 2010-07-09 DIAGNOSIS — Z01812 Encounter for preprocedural laboratory examination: Secondary | ICD-10-CM | POA: Insufficient documentation

## 2010-07-09 LAB — BASIC METABOLIC PANEL
BUN: 14 mg/dL (ref 6–23)
CO2: 29 mEq/L (ref 19–32)
Calcium: 9.1 mg/dL (ref 8.4–10.5)
Chloride: 100 mEq/L (ref 96–112)
Creatinine, Ser: 0.7 mg/dL (ref 0.4–1.2)
GFR calc Af Amer: >60 mL/min (ref >60)

## 2010-07-09 LAB — SURGICAL PCR SCREEN: MRSA, PCR: NEGATIVE

## 2010-07-09 LAB — HEMOGLOBIN AND HEMATOCRIT, BLOOD: HCT: 41.2 % (ref 36.0–46.0)

## 2010-07-09 LAB — PROTIME-INR: Prothrombin Time: 12.1 seconds (ref 11.6–15.2)

## 2010-07-11 ENCOUNTER — Other Ambulatory Visit: Payer: Self-pay | Admitting: Orthopedic Surgery

## 2010-07-11 ENCOUNTER — Ambulatory Visit (HOSPITAL_COMMUNITY)
Admission: RE | Admit: 2010-07-11 | Discharge: 2010-07-11 | Disposition: A | Discharge status: Home / Self Care | Payer: Medicare Other | Source: Ambulatory Visit | Attending: Orthopedic Surgery | Admitting: Orthopedic Surgery

## 2010-07-11 DIAGNOSIS — Z01812 Encounter for preprocedural laboratory examination: Secondary | ICD-10-CM | POA: Insufficient documentation

## 2010-07-11 DIAGNOSIS — M2340 Loose body in knee, unspecified knee: Secondary | ICD-10-CM | POA: Insufficient documentation

## 2010-07-12 ENCOUNTER — Encounter: Payer: Self-pay | Admitting: Orthopedic Surgery

## 2010-07-12 ENCOUNTER — Encounter (INDEPENDENT_AMBULATORY_CARE_PROVIDER_SITE_OTHER): Payer: Self-pay | Admitting: *Deleted

## 2010-07-12 NOTE — Assessment & Plan Note (Signed)
Summary: check left knee ? popping out/mcr/bsf   Visit Type:  Follow-up  CC:  left knee pain.  History of Present Illness: I saw Sharon [name] in the office today for a followup visit.  She is a 55 years old woman with the complaint of:  left knee popping out of joint since yesterday 07/03/10.  DOS 03-10-07 left TKA.    The patient presents with acute onset of medial knee pain and that the joint is popping in and out of place since yesterday. she feels that the knee and leg are now weak.  She has some swelling no numbness she is complaining of her radiation of pain up into the LEFT hip since this started.  When she does have the episodes she has severe pain.  Current medications are  Vicodin 5 as needed for pain, Motrin 800mg  two times a day or three times a day.  Review of systems:Has had 2 ESI's recently for L spine, last one was 06/29/10.      Allergies: 1)  ! Neosporin  Past History:  Past Medical History: Last updated: 01/18/2009 GERD Pneumonia migraines melanoma  Past Surgical History: Last updated: 01/18/2009 Appendectomy Tonsillectomy HYSTERECTOMY left knee total 2008.  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 FH of Cancer:  Lung Disease  Social History: Reviewed history from 03/09/2007 and no changes required. 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 Cardiovascular:  Denies chest pain. Respiratory:  Denies short of breath. Gastrointestinal:  Denies heartburn. Genitourinary:  Denies frequency. Neurologic:  Complains of numbness, tingling, and unsteady gait. Musculoskeletal:  Complains of joint pain,  instability, and muscle pain. Endocrine:  Denies excessive thirst. Psychiatric:  Denies depression. Skin:  Denies changes in the skin. HEENT:  Denies blurred or double vision, eye pain, redness, and watering. Immunology:  Denies seasonal allergies, sinus problems, and allergic to bee stings. Hemoatologic:  Denies easy bleeding and brusing.  Physical Exam  Msk:  well-developed well-nourished obese female ambulates with a cane favoring the LEFT lower extremity Pulses:  The pulses and perfusion were normal with normal color, temperature  and no swelling  Extremities:   The upper extremities have normal appearance, ROM, strength and stability.     The RIGHT knee is nontender with no fluid however there is tenderness and crepitance.  Range of motion limited 125 knee stable  Strength normal  LEFT knee.  There is tenderness over the anteromedial tibia.  There is no joint effusion.  Her flexion arc is 120.  In extension she is stable with some hyperextension.  Varus valgus stress test normal.  In flexion she has an anterior drawer, but does not seem to be excessive.  Posterior drawer test negative. Neurologic:  The coordination and sensation were normal  The reflexes were normal   Skin:  intact without lesions or rashes Inguinal Nodes:  no significant adenopathy Psych:  alert and cooperative; normal mood and affect; normal attention span and concentration   Impression & Recommendations:  Problem # 1:  LOOSE BODY-KNEE (ICD-717.6) Assessment New  x-rays were taken and compared to all previous films taken since surgery.  There is a large bone fragment in the posterior row medial aspect of the  knee.  It is unclear whether this is intra-articular or extra-articular.  There appears to be no wear of the polyethylene liner.  Impression loose body  Differential diagnosis: Loose body, patellar clunk syndrome, loose flexion gap.  After a long discussion with Ms. Vanpelt we decided to go  ahead and open the knee joint to look for the loose body.  We will have to change the polyethylene and can do a Check test at that time.  If we need to we can increase the polyethylene from a 16-18.  We will need the Stryker knee system  Treatment plan arthrotomy removal of loose body polyethylene exchange.  We will need C-arm and possibly plain films during the case  Orders: Est. Patient Level IV (16109) Knee x-ray,  3 views (60454)  Problem # 2:  KNEE REPLACEMENT, LEFT, HX OF (ICD-V43.65) Assessment: Comment Only  Orders: Est. Patient Level IV (09811) Knee x-ray,  3 views (91478)  Patient Instructions: 1)  DOS 07/13/10 2)  preop is at [facility] short stay on 07/09/10 at 1:30pm, take packet with you 3)  Stop all blood thinners on 07/06/10 4)  Post op 1 in our office on 07/30/10   Orders Added: 1)  Est. Patient Level IV [29562] 2)  Knee x-ray,  3 views [73562]  Appended Document: check left knee ? popping out/mcr/bsf AP lateral and patellofemoral views of the LEFT knee are obtained  The alignment looks normal.  The patellofemoral joint is well centered.  There is a large loose bone fragment in the posteromedial aspect of the joint.  Impression normal alignment with new finding of bone fragment posteromedially in the knee not present on any of the previous films that were taken since surgery.

## 2010-07-12 NOTE — Miscellaneous (Signed)
Summary: [facility]ed order to [facility] for [facility]  Clinical Lists Changes

## 2010-07-13 ENCOUNTER — Ambulatory Visit (HOSPITAL_COMMUNITY): Payer: Medicare Other

## 2010-07-13 ENCOUNTER — Inpatient Hospital Stay (HOSPITAL_COMMUNITY)
Admission: RE | Admit: 2010-07-13 | Discharge: 2010-07-14 | DRG: 468 | Disposition: A | Discharge status: Home Health | Payer: Medicare Other | Source: Ambulatory Visit | Attending: Orthopedic Surgery | Admitting: Orthopedic Surgery

## 2010-07-13 ENCOUNTER — Other Ambulatory Visit: Payer: Self-pay | Admitting: Orthopedic Surgery

## 2010-07-13 ENCOUNTER — Encounter: Payer: Self-pay | Admitting: Orthopedic Surgery

## 2010-07-13 DIAGNOSIS — Z01812 Encounter for preprocedural laboratory examination: Secondary | ICD-10-CM

## 2010-07-13 DIAGNOSIS — Y831 Surgical operation with implant of artificial internal device as the cause of abnormal reaction of the patient, or of later complication, without mention of misadventure at the time of the procedure: Secondary | ICD-10-CM | POA: Diagnosis present

## 2010-07-13 DIAGNOSIS — T84099A Other mechanical complication of unspecified internal joint prosthesis, initial encounter: Secondary | ICD-10-CM

## 2010-07-13 DIAGNOSIS — M2340 Loose body in knee, unspecified knee: Secondary | ICD-10-CM

## 2010-07-13 DIAGNOSIS — Z96649 Presence of unspecified artificial hip joint: Secondary | ICD-10-CM

## 2010-07-13 DIAGNOSIS — M2350 Chronic instability of knee, unspecified knee: Secondary | ICD-10-CM | POA: Diagnosis present

## 2010-07-13 DIAGNOSIS — Z96659 Presence of unspecified artificial knee joint: Secondary | ICD-10-CM

## 2010-07-14 ENCOUNTER — Encounter: Payer: Self-pay | Admitting: Orthopedic Surgery

## 2010-07-14 LAB — BASIC METABOLIC PANEL
CO2: 29 mEq/L (ref 19–32)
Chloride: 105 mEq/L (ref 96–112)
GFR calc Af Amer: >60 mL/min (ref >60)
Glucose, Bld: 110 mg/dL — ABNORMAL HIGH (ref 70–99)
Sodium: 140 mEq/L (ref 135–145)

## 2010-07-14 LAB — CBC
HCT: 33.4 % — ABNORMAL LOW (ref 36.0–46.0)
Hemoglobin: 10.8 g/dL — ABNORMAL LOW (ref 12.0–15.0)
MCH: 32.1 pg (ref 26.0–34.0)
MCHC: 32.3 g/dL (ref 30.0–36.0)
MCV: 99.4 fL (ref 78.0–100.0)

## 2010-07-14 LAB — DIFFERENTIAL
Basophils Absolute: 0 10*3/uL (ref 0.0–0.1)
Lymphocytes Relative: 19 % (ref 12–46)
Lymphs Abs: 2.1 10*3/uL (ref 0.7–4.0)
Monocytes Absolute: 1.6 10*3/uL — ABNORMAL HIGH (ref 0.1–1.0)
Monocytes Relative: 14 % — ABNORMAL HIGH (ref 3–12)
Neutro Abs: 7.2 10*3/uL (ref 1.7–7.7)

## 2010-07-14 NOTE — Op Note (Signed)
NAMEALDONIA, [name]             ACCOUNT NO.:  [id number]  MEDICAL RECORD NO.:  [id number]           PATIENT TYPE:  I  LOCATION:  A312                          FACILITY:  [facility]  PHYSICIAN:  Vickki Hearing, M.D.DATE OF BIRTH:  1955/08/20  DATE OF PROCEDURE:  07/13/2010 DATE OF DISCHARGE:                              OPERATIVE REPORT   HISTORY:  This is a 55 year old female who had a total knee replacement of left knee with a Stryker Triathlon PS Knee in 2008, did well until approximately 2 weeks ago when she felt an acute pain and popping sensation in her left knee, followed by a feeling of instability.  When she was brought in for evaluation there was a loose fragment of bone seen in the posteromedial joint space.  She was advised to have surgery to have it removed.  PREOPERATIVE DIAGNOSIS:  Loose body, left total knee.  POSTOPERATIVE DIAGNOSES:  Loose bodies, left total knee.  Posterior capsular posterior cruciate ligament remnant avulsion, left knee.  PROCEDURE: REMOVAL OF LOOSE BODY, POLYETHYLENE EXCHANGE LEFT KNEE   SURGEON:  Vickki Hearing, MD  ASSISTANT:  [facility].  OPERATIVE FINDINGS:  There was a large posteromedial cement bone fragment in the posteromedial joint space and there was flexion gap and extension gap instability with evidence of capsular PCL remnant rupture.  Tourniquet time was approximately 30 minutes and the tourniquet pressure was lost during the procedure.  The knee procedure was done as follows.  The patient was identified as Sharon Scarlet. [name].  Her left knee was marked for surgery, countersigned by the surgeon, chart was updated.  The patient was taken to surgery, had a gram of Ancef.  General anesthesia was used.  After successful induction, the C-arm was brought in and the radiographs were taken. Unfortunately, we could not see the bone cement fragment with the C-arm and it was taken away.  The leg was then wrapped with a  6-inch Esmarch.  Tourniquet was inflated to 300 mmHg.  Time-out procedure was executed.  A straight midline incision was used from the previous surgery. Subcutaneous tissue was divided.  Medial arthrotomy was performed. Patella was everted.  Scar tissue around the patella was removed.  The polyethylene liner was removed.  The knee was irrigated and debrided. Culture was obtained.  The posteromedial bone fragment was found and removed and sent for specimen.  Posterior capsular rupture was evident by increase flexion gap and extension gap space.  We then trialed with a 19 and a 22 polyethylene insert.  The 22-mm insert gave the best stability and range of motion.  We then placed a 22 insert and checked the knee for stability and range of motion.  We then irrigated the knee again, injected 30 mL of Marcaine with epinephrine, and closed with interrupted Bralon #1 suture and then one layer of 0 Monocryl and one layer of 2-0 Monocryl and then a second injection of Marcaine with epinephrine.  Staples were used to close the skin.  We did use a subcu pain pump.  We applied sterile dressings and Ace wraps along with a cryo cuff which was activated.  The patient was taken to recovery room in stable condition.  Prior to taking the patient to the recovery room, radiograph was obtained which showed the prosthesis to be in good position and the bone fragment had been removed.  ADDENDUM:  Prior to incision, an exam under anesthesia and fluoroscopy was done and it did show hyperextension and increased laxity in flexion.     Vickki Hearing, M.D.     SEH/MEDQ  D:  07/13/2010  T:  07/14/2010  Job:  [id number]  Electronically Signed by Fuller Canada M.D. on 07/14/2010 10:40:50 AM

## 2010-07-15 LAB — CROSSMATCH

## 2010-07-16 ENCOUNTER — Ambulatory Visit (INDEPENDENT_AMBULATORY_CARE_PROVIDER_SITE_OTHER): Payer: Medicare Other | Admitting: Orthopedic Surgery

## 2010-07-16 ENCOUNTER — Encounter: Payer: Self-pay | Admitting: Orthopedic Surgery

## 2010-07-16 DIAGNOSIS — M1710 Unilateral primary osteoarthritis, unspecified knee: Secondary | ICD-10-CM

## 2010-07-16 DIAGNOSIS — IMO0002 Reserved for concepts with insufficient information to code with codable children: Secondary | ICD-10-CM

## 2010-07-16 DIAGNOSIS — Z96659 Presence of unspecified artificial knee joint: Secondary | ICD-10-CM

## 2010-07-16 DIAGNOSIS — M2340 Loose body in knee, unspecified knee: Secondary | ICD-10-CM

## 2010-07-16 LAB — CULTURE, ROUTINE-ABSCESS: Culture: NO GROWTH

## 2010-07-18 NOTE — Miscellaneous (Signed)
Visit Type:  Follow-up  CC:  left knee pain.  History of Present Illness: I saw Sharon [name] in the office today for a followup visit.  She is a 55 years old woman with the complaint of:  left knee popping out of joint since yesterday 07/03/10.  DOS 03-10-07 left TKA.    The patient presents with acute onset of medial knee pain and that the joint is popping in and out of place since yesterday. she feels that the knee and leg are now weak.  She has some swelling no numbness she is complaining of her radiation of pain up into the LEFT hip since this started.  When she does have the episodes she has severe pain.  Current medications are  Vicodin 5 as needed for pain, Motrin 800mg  two times a day or three times a day.  Review of systems:Has had 2 ESI's recently for L spine, last one was 06/29/10.      Allergies: 1)  ! Neosporin  Past History:  Past Medical History: Last updated: 01/18/2009 GERD Pneumonia migraines melanoma  Past Surgical History: Last updated: 01/18/2009 Appendectomy Tonsillectomy HYSTERECTOMY left knee total 2008.  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 FH of Cancer:  Lung Disease  Social History: Reviewed history from 03/09/2007 and no changes required. 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 Cardiovascular:  Denies chest pain. Respiratory:  Denies short of breath. Gastrointestinal:  Denies heartburn. Genitourinary:  Denies frequency. Neurologic:  Complains of numbness, tingling, and unsteady gait. Musculoskeletal:  Complains of joint pain, instability, and muscle pain. Endocrine:  Denies  excessive thirst. Psychiatric:  Denies depression. Skin:  Denies changes in the skin. HEENT:  Denies blurred or double vision, eye pain, redness, and watering. Immunology:  Denies seasonal allergies, sinus problems, and allergic to bee stings. Hemoatologic:  Denies easy bleeding and brusing.  Physical Exam  Msk:  well-developed well-nourished obese female ambulates with a cane favoring the LEFT lower extremity Pulses:  The pulses and perfusion were normal with normal color, temperature  and no swelling  Extremities:   The upper extremities have normal appearance, ROM, strength and stability.     The RIGHT knee is nontender with no fluid however there is tenderness and crepitance.  Range of motion limited 125 knee stable  Strength normal  LEFT knee.  There is tenderness over the anteromedial tibia.  There is no joint effusion.  Her flexion arc is 120.  In extension she is stable with some hyperextension.  Varus valgus stress test normal.  In flexion she has an anterior drawer, but does not seem to be excessive.  Posterior drawer test negative. Neurologic:  The coordination and sensation were normal  The reflexes were normal   Skin:  intact without lesions or rashes Inguinal Nodes:  no significant adenopathy Psych:  alert and cooperative; normal mood and affect; normal attention span and concentration   Impression & Recommendations:  Problem # 1:  LOOSE BODY-KNEE (ICD-717.6) Assessment New  x-rays were taken and compared to all previous films taken since surgery.  There is a large bone fragment in the posterior row medial aspect of the knee.  It is unclear whether this is intra-articular  or extra-articular.  There appears to be no wear of the polyethylene liner.  Impression loose body  Differential diagnosis: Loose body, patellar clunk syndrome, loose flexion gap.  After a long discussion with Sharon [name] we decided to go ahead and open the knee joint to look for the  loose body.  We will have to change the polyethylene and can do a Check test at that time.  If we need to we can increase the polyethylene from a 16-18.  We will need the Stryker knee system  Treatment plan arthrotomy removal of loose body polyethylene exchange.LEFT KNEE  We will need C-arm and possibly plain films during the case  Orders: Est. Patient Level IV (41324) Knee x-ray,  3 views (40102)  Problem # 2:  KNEE REPLACEMENT, LEFT, HX OF (ICD-V43.65) Assessment: Comment Only  Orders: Est. Patient Level IV (72536) Knee x-ray,  3 views (64403)  Patient Instructions: 1)  DOS 07/13/10 2)  preop is at [facility] short stay on 07/09/10 at 1:30pm, take packet with you 3)  Stop all blood thinners on 07/06/10 4)  Post op 1 in our office on 07/30/10   Orders Added: 1)  Est. Patient Level IV [47425] 2)  Knee x-ray,  3 views [73562]    Signed by Fuller Canada MD on 07/05/2010 at 8:33 AM  ________________________________________________________________________ AP lateral and patellofemoral views of the LEFT knee are obtained  The alignment looks normal.  The patellofemoral joint is well centered.  There is a large loose bone fragment in the posteromedial aspect of the joint.  Impression normal alignment with new finding of bone fragment posteromedially in the knee not present on any of the previous films that were taken since surgery.   Signed by Fuller Canada MD on 07/05/2010 at 8:33 AM  ________________________________________________________________________

## 2010-07-18 NOTE — Miscellaneous (Signed)
Summary: No pre-authorization required for surgery  Clinical Lists Changes  No Pre-auth requiredper Medicare guidelines for surgery scheduled 07/13/10 at [facility], procedure: removal of loose body LT knee post TKA

## 2010-07-18 NOTE — Letter (Signed)
Summary: surgery order LT knee sched 07/13/10  surgery order LT knee sched 07/13/10   Imported By: Cammie Sickle 07/10/2010 20:36:31  _____________________________________________________________________  External Attachment:    Type:   Image     Comment:   External Document

## 2010-07-24 ENCOUNTER — Encounter: Payer: Self-pay | Admitting: Orthopedic Surgery

## 2010-07-24 ENCOUNTER — Ambulatory Visit (INDEPENDENT_AMBULATORY_CARE_PROVIDER_SITE_OTHER): Payer: Medicare Other | Admitting: Orthopedic Surgery

## 2010-07-24 DIAGNOSIS — Z9889 Other specified postprocedural states: Secondary | ICD-10-CM | POA: Insufficient documentation

## 2010-07-24 DIAGNOSIS — Z96659 Presence of unspecified artificial knee joint: Secondary | ICD-10-CM

## 2010-07-24 DIAGNOSIS — M2340 Loose body in knee, unspecified knee: Secondary | ICD-10-CM

## 2010-07-24 NOTE — Letter (Signed)
Summary: Discharge instructions  Discharge instructions   Imported By: Cammie Sickle 07/16/2010 17:14:57  _____________________________________________________________________  External Attachment:    Type:   Image     Comment:   External Document

## 2010-07-24 NOTE — Assessment & Plan Note (Signed)
Summary: POST OP 1/LT KNEE,per patient's hosp D/C paperwork/MEDICARE/CAF   Visit Type:  Follow-up  CC:  post op left knee.  History of Present Illness:   post op visit 1   left knee, removal of loose body, polyethylene exchange left knee.  The loose body appeared to be a bone, cement fragment with PCL and posterior capsular attachment.  This would explain the loose feeling in the knee as well as the x-ray appearance.  DOS 07/13/10.  POD 3.  Meds: Ibuprofen 800mg , Norco 10, Robaxin 500, doing well.  she complains of 7/10 pain and some tightness in the knee.  She would like to go back on the CPM machine.  We changed her dressing her incision looks great she does have some knee swelling no calf tenderness negative Homans sign  Recommend start CPM 0-50 increase 10 per day he is 4-6 hours per day continue physical therapy followup in about a week for staple removal     Allergies: 1)  ! Neosporin   Impression & Recommendations:  Problem # 1:  KNEE REPLACEMENT, LEFT, HX OF (ICD-V43.65) Assessment Comment Only  Orders: Post-Op Check (78295)  Problem # 2:  LOOSE BODY-KNEE (ICD-717.6) Assessment: Comment Only  path report is pending  Orders: Post-Op Check (62130)  Problem # 3:  KNEE, ARTHRITIS, DEGEN./OSTEO (ICD-715.96) Assessment: Comment Only  Her updated medication list for this problem includes:    Vicodin 5-500 Mg Tabs (Hydrocodone-acetaminophen) ..... One by mouth q 4 hrs as needed pain    Ultram 50 Mg Tabs (Tramadol hcl) .Marland Kitchen... 1-2 by mouth q 4 as needed pain  Orders: Post-Op Check (86578)  Problem # 4:  TOTAL KNEE FOLLOW-UP (ICD-V43.65)  Orders: Post-Op Check (46962)  Patient Instructions: 1)  tues next week take staples out    Orders Added: 1)  Post-Op Check [95284]

## 2010-07-26 ENCOUNTER — Encounter: Payer: Self-pay | Admitting: Orthopedic Surgery

## 2010-07-27 ENCOUNTER — Encounter: Payer: Self-pay | Admitting: Orthopedic Surgery

## 2010-07-30 ENCOUNTER — Ambulatory Visit: Payer: Medicare Other | Admitting: Orthopedic Surgery

## 2010-07-31 NOTE — Discharge Summary (Addendum)
  Sharon [name], Sharon [name]             ACCOUNT NO.:  [id number]  MEDICAL RECORD NO.:  [id number]           PATIENT TYPE:  I  LOCATION:  A312                          FACILITY:  [facility]  PHYSICIAN:  Vickki Hearing, M.D.DATE OF BIRTH:  [birth year]  DATE OF ADMISSION:  07/13/2010 DATE OF DISCHARGE:  02/18/2012LH                              DISCHARGE SUMMARY   ADMITTING DIAGNOSIS:  Loose body, left knee.  DISCHARGE DIAGNOSIS:  Loose body, left knee.  SECONDARY DIAGNOSIS:  Rupture of posterior cruciate ligament and posterior capsule  OPERATIVE PROCEDURE:  Removal of loose body left knee, polyethylene exchange left knee.  OPERATIVE FINDINGS:  Large posteromedial cement bone fragment and flexion-extension gap instability with capsular and PCL rupture.  HOSPITAL COURSE:  The patient was admitted on the 17th, underwent uncomplicated removal of loose body and polyethylene exchange and intraoperative culture was obtained which so far has no growth.  She was admitted to the hospital for pain control and was doing well. On postop day #1, she was afebrile.  She ambulated 20 feet, flexor knee 70 degrees and was discharged to home with home health for physical therapy.  DISCHARGE MEDICATIONS: 1. Fish oil 1000 mg 1 capsule daily. 2. Lasix 20 mg 1 daily. 3. One multivitamin daily. 4. Ibuprofen 800 mg q.6 h. p.r.n. for pain. 5. Protonix 40 mg daily. 6. Calcium 1200 mg 1 daily. 7. Potassium chloride 20 mEq 1 daily. 8. Hydrocodone 10/325 mg 1 q.4 h. p.r.n. for pain. 9. Robaxin 500 mg q.6 p.r.n. for soreness and muscle spasm.  Followup has been arranged for Tuesday 21st.  Overall condition is improved.     Vickki Hearing, M.D.     SEH/MEDQ  D:  07/14/2010  T:  07/14/2010  Job:  [id number]  Electronically Signed by Fuller Canada M.D. on 07/31/2010 11:39:49 AM Electronically Signed by Fuller Canada M.D. on 07/31/2010 11:56:11 AM Electronically Signed by Fuller Canada  M.D. on 07/31/2010 12:10:37 PM Electronically Signed by Fuller Canada M.D. on 07/31/2010 12:26:16 PM Electronically Signed by Fuller Canada M.D. on 07/31/2010 12:42:57 PM Electronically Signed by Fuller Canada M.D. on 07/31/2010 01:01:08 PM Electronically Signed by Fuller Canada M.D. on 07/31/2010 01:20:42 PM Electronically Signed by Fuller Canada M.D. on 07/31/2010 01:42:18 PM Electronically Signed by Fuller Canada M.D. on 07/31/2010 02:02:13 PM Electronically Signed by Fuller Canada M.D. on 07/31/2010 81:19:14 PM Electronically Signed by Fuller Canada M.D. on 07/31/2010 02:45:00 PM Electronically Signed by Fuller Canada M.D. on 07/31/2010 03:08:30 PM Electronically Signed by Fuller Canada M.D. on 07/31/2010 03:33:21 PM Electronically Signed by Fuller Canada M.D. on 07/31/2010 04:08:05 PM Electronically Signed by Fuller Canada M.D. on 07/31/2010 04:39:25 PM Electronically Signed by Fuller Canada M.D. on 07/31/2010 05:11:19 PM Electronically Signed by Fuller Canada M.D. on 07/31/2010 05:11:19 PM Electronically Signed by Fuller Canada M.D. on 07/31/2010 05:40:30 PM Electronically Signed by Fuller Canada M.D. on 07/31/2010 07:32:31 PM

## 2010-08-01 ENCOUNTER — Encounter: Payer: Self-pay | Admitting: Orthopedic Surgery

## 2010-08-02 ENCOUNTER — Encounter: Payer: Self-pay | Admitting: Orthopedic Surgery

## 2010-08-02 NOTE — Letter (Signed)
Summary: Letter of medical necessity/equipment  Letter of medical necessity/equipment   Imported By: Jacklynn Ganong 07/26/2010 16:52:03  _____________________________________________________________________  External Attachment:    Type:   Image     Comment:   External Document

## 2010-08-02 NOTE — Assessment & Plan Note (Signed)
Summary: SUTURE REM/POST OP KNEE SURG 07/13/10/MEDICARE/CAF   Visit Type:  Follow-up  CC:  post op knee.  History of Present Illness:   post op visit 2  left knee, removal of loose body, polyethylene exchange left knee.  past report confirmed bone as loose body. DOS 07/13/10.  POD 11  Meds: Ibuprofen 800mg , Norco 10, Robaxin 500, doing well.  Today is recheck and staple removal.  therapy with H&P. Pain level is 7/10. No problems. Patient is satisfied at this point.     Allergies: 1)  ! Neosporin  Physical Exam  Additional Exam:  THE WOUND LOOKS GOOD.   THE STAPLES WERE REMOVED      Impression & Recommendations:  Problem # 1:  KNEE REPLACEMENT, LEFT, HX OF (ICD-V43.65) Assessment Comment Only  Orders: Post-Op Check (24401)  Problem # 2:  LOOSE BODY-KNEE (ICD-717.6) Assessment: Improved  Orders: Post-Op Check (02725)  Problem # 3:  OTHER POSTSURGICAL STATUS OTHER (ICD-V45.89) Assessment: Improved  Orders: Post-Op Check (36644)  Patient Instructions: 1)  continue PT and return in 4 weeks  2)  call when your home PT is done to get new order for out patient PT    Orders Added: 1)  Post-Op Check [03474]

## 2010-08-07 NOTE — Miscellaneous (Addendum)
Summary: Home Health plan of care  [facility] plan of care   Imported By: Jacklynn Ganong 08/02/2010 16:29:19  _____________________________________________________________________  External Attachment:    Type:   Image     Comment:   External Document

## 2010-08-10 ENCOUNTER — Encounter: Payer: Self-pay | Admitting: Orthopedic Surgery

## 2010-08-10 LAB — CBC
HCT: 40.7 % (ref 36.0–46.0)
Hemoglobin: 13.8 g/dL (ref 12.0–15.0)
RDW: 12.6 % (ref 11.5–15.5)
WBC: 7.2 10*3/uL (ref 4.0–10.5)

## 2010-08-10 LAB — BASIC METABOLIC PANEL
BUN: 12 mg/dL (ref 6–23)
CO2: 25 mEq/L (ref 19–32)
Chloride: 103 mEq/L (ref 96–112)
Glucose, Bld: 119 mg/dL — ABNORMAL HIGH (ref 70–99)
Potassium: 4 mEq/L (ref 3.5–5.1)
Sodium: 135 mEq/L (ref 135–145)

## 2010-08-10 LAB — SURGICAL PCR SCREEN: MRSA, PCR: NEGATIVE

## 2010-08-13 ENCOUNTER — Telehealth: Payer: Self-pay | Admitting: *Deleted

## 2010-08-14 NOTE — Miscellaneous (Signed)
Summary: Sharon [name] Face to face encounter   [facility] Face to face encounter   Imported By: Jacklynn Ganong 08/10/2010 11:16:15  _____________________________________________________________________  External Attachment:    Type:   Image     Comment:   External Document

## 2010-08-14 NOTE — Miscellaneous (Signed)
Summary: Genevieve [facility]lander face to face encounter form  [facility] face to face encounter form   Imported By: Cammie Sickle 08/08/2010 19:12:07  _____________________________________________________________________  External Attachment:    Type:   Image     Comment:   External Document

## 2010-08-15 ENCOUNTER — Encounter: Payer: Self-pay | Admitting: Orthopedic Surgery

## 2010-08-16 ENCOUNTER — Ambulatory Visit (HOSPITAL_COMMUNITY)
Admission: RE | Admit: 2010-08-16 | Discharge: 2010-08-16 | Disposition: A | Discharge status: Home / Self Care | Payer: Medicare Other | Source: Ambulatory Visit | Attending: Orthopedic Surgery | Admitting: Orthopedic Surgery

## 2010-08-16 DIAGNOSIS — IMO0001 Reserved for inherently not codable concepts without codable children: Secondary | ICD-10-CM | POA: Insufficient documentation

## 2010-08-16 DIAGNOSIS — M25469 Effusion, unspecified knee: Secondary | ICD-10-CM | POA: Insufficient documentation

## 2010-08-16 DIAGNOSIS — M25569 Pain in unspecified knee: Secondary | ICD-10-CM | POA: Insufficient documentation

## 2010-08-16 DIAGNOSIS — R262 Difficulty in walking, not elsewhere classified: Secondary | ICD-10-CM | POA: Insufficient documentation

## 2010-08-16 DIAGNOSIS — M6281 Muscle weakness (generalized): Secondary | ICD-10-CM | POA: Insufficient documentation

## 2010-08-16 DIAGNOSIS — M25669 Stiffness of unspecified knee, not elsewhere classified: Secondary | ICD-10-CM | POA: Insufficient documentation

## 2010-08-21 ENCOUNTER — Ambulatory Visit (HOSPITAL_COMMUNITY)
Admission: RE | Admit: 2010-08-21 | Discharge: 2010-08-21 | Disposition: A | Discharge status: Home / Self Care | Payer: Medicare Other | Source: Ambulatory Visit | Attending: *Deleted | Admitting: *Deleted

## 2010-08-22 ENCOUNTER — Ambulatory Visit (INDEPENDENT_AMBULATORY_CARE_PROVIDER_SITE_OTHER): Payer: Medicare Other | Admitting: Orthopedic Surgery

## 2010-08-22 DIAGNOSIS — Z96659 Presence of unspecified artificial knee joint: Secondary | ICD-10-CM

## 2010-08-22 NOTE — Progress Notes (Signed)
6 weeks status post polyethylene exchange, LEFT knee, removal of fracture, and cement apparently from the posterior capsule/PCL remnant.  Patient much improved. She does have a couple of Vicryl stitches, which are poking through. There no signs of infection.  She is ambulatory without assistive device. Her pain has improved significantly.  Recommend continued physical therapy and strengthening exercises range of motion exercises and come back in 2 months

## 2010-08-23 ENCOUNTER — Ambulatory Visit (HOSPITAL_COMMUNITY)
Admission: RE | Admit: 2010-08-23 | Discharge: 2010-08-23 | Disposition: A | Discharge status: Home / Self Care | Payer: Medicare Other | Source: Ambulatory Visit | Attending: *Deleted | Admitting: *Deleted

## 2010-08-23 NOTE — Progress Notes (Signed)
Summary: patient call requesting transition to out-patient PT  Phone Note Call from Patient   Caller: Patient Summary of Call: Patient left message to relay that she has completed home therapy and requests order for [facility] out-patient physical therapy.  No phone message or request for order from [facility] that we have seen in front office. Please advise.  Patient ph# [phone]. Initial call taken by: Cammie Sickle,  August 13, 2010 10:40 AM  Follow-up for Phone Call        I faxed order for patient to [facility] therapy Follow-up by: Ether Griffins,  August 13, 2010 10:42 AM  Additional Follow-up for Phone Call Additional follow up Details #1::        thank you - patient is aware, correct? Additional Follow-up by: Cammie Sickle,  August 13, 2010 1:27 PM

## 2010-08-27 ENCOUNTER — Ambulatory Visit (HOSPITAL_COMMUNITY)
Admission: RE | Admit: 2010-08-27 | Discharge: 2010-08-27 | Disposition: A | Discharge status: Home / Self Care | Payer: Medicare Other | Source: Ambulatory Visit | Attending: Orthopedic Surgery | Admitting: Orthopedic Surgery

## 2010-08-27 DIAGNOSIS — M25669 Stiffness of unspecified knee, not elsewhere classified: Secondary | ICD-10-CM | POA: Insufficient documentation

## 2010-08-27 DIAGNOSIS — M6281 Muscle weakness (generalized): Secondary | ICD-10-CM | POA: Insufficient documentation

## 2010-08-27 DIAGNOSIS — R262 Difficulty in walking, not elsewhere classified: Secondary | ICD-10-CM | POA: Insufficient documentation

## 2010-08-27 DIAGNOSIS — IMO0001 Reserved for inherently not codable concepts without codable children: Secondary | ICD-10-CM | POA: Insufficient documentation

## 2010-08-27 DIAGNOSIS — M25569 Pain in unspecified knee: Secondary | ICD-10-CM | POA: Insufficient documentation

## 2010-08-27 DIAGNOSIS — M25469 Effusion, unspecified knee: Secondary | ICD-10-CM | POA: Insufficient documentation

## 2010-08-28 ENCOUNTER — Ambulatory Visit (HOSPITAL_COMMUNITY): Payer: Medicare Other

## 2010-09-04 ENCOUNTER — Ambulatory Visit (HOSPITAL_COMMUNITY)
Admission: RE | Admit: 2010-09-04 | Discharge: 2010-09-04 | Disposition: A | Discharge status: Home / Self Care | Payer: Medicare Other | Source: Ambulatory Visit | Attending: *Deleted | Admitting: *Deleted

## 2010-09-06 ENCOUNTER — Ambulatory Visit (HOSPITAL_COMMUNITY): Payer: Medicare Other | Admitting: Physical Therapy

## 2010-09-11 ENCOUNTER — Ambulatory Visit (HOSPITAL_COMMUNITY)
Admission: RE | Admit: 2010-09-11 | Discharge: 2010-09-11 | Disposition: A | Discharge status: Home / Self Care | Payer: Medicare Other | Source: Ambulatory Visit | Attending: *Deleted | Admitting: *Deleted

## 2010-09-13 ENCOUNTER — Ambulatory Visit (HOSPITAL_COMMUNITY)
Admission: RE | Admit: 2010-09-13 | Discharge: 2010-09-13 | Disposition: A | Discharge status: Home / Self Care | Payer: Medicare Other | Source: Ambulatory Visit | Attending: *Deleted | Admitting: *Deleted

## 2010-09-18 ENCOUNTER — Ambulatory Visit (HOSPITAL_COMMUNITY)
Admission: RE | Admit: 2010-09-18 | Discharge: 2010-09-18 | Disposition: A | Discharge status: Home / Self Care | Payer: Medicare Other | Source: Ambulatory Visit | Attending: *Deleted | Admitting: *Deleted

## 2010-09-20 ENCOUNTER — Ambulatory Visit (HOSPITAL_COMMUNITY)
Admission: RE | Admit: 2010-09-20 | Discharge: 2010-09-20 | Disposition: A | Discharge status: Home / Self Care | Payer: Medicare Other | Source: Ambulatory Visit | Attending: Orthopedic Surgery | Admitting: Orthopedic Surgery

## 2010-09-20 DIAGNOSIS — R262 Difficulty in walking, not elsewhere classified: Secondary | ICD-10-CM | POA: Insufficient documentation

## 2010-09-20 DIAGNOSIS — M25669 Stiffness of unspecified knee, not elsewhere classified: Secondary | ICD-10-CM | POA: Insufficient documentation

## 2010-09-20 DIAGNOSIS — M25569 Pain in unspecified knee: Secondary | ICD-10-CM | POA: Insufficient documentation

## 2010-09-20 DIAGNOSIS — M6281 Muscle weakness (generalized): Secondary | ICD-10-CM | POA: Insufficient documentation

## 2010-09-20 DIAGNOSIS — M25469 Effusion, unspecified knee: Secondary | ICD-10-CM | POA: Insufficient documentation

## 2010-09-20 DIAGNOSIS — IMO0001 Reserved for inherently not codable concepts without codable children: Secondary | ICD-10-CM | POA: Insufficient documentation

## 2010-09-25 ENCOUNTER — Ambulatory Visit (HOSPITAL_COMMUNITY): Payer: Medicare Other | Admitting: Physical Therapy

## 2010-09-27 ENCOUNTER — Ambulatory Visit (HOSPITAL_COMMUNITY): Payer: Medicare Other | Admitting: Physical Therapy

## 2010-10-01 ENCOUNTER — Other Ambulatory Visit: Payer: Self-pay | Admitting: Orthopedic Surgery

## 2010-10-02 ENCOUNTER — Ambulatory Visit (HOSPITAL_COMMUNITY)
Admission: RE | Admit: 2010-10-02 | Discharge: 2010-10-02 | Disposition: A | Discharge status: Home / Self Care | Payer: Medicare Other | Source: Ambulatory Visit | Attending: Orthopedic Surgery | Admitting: Orthopedic Surgery

## 2010-10-02 DIAGNOSIS — R262 Difficulty in walking, not elsewhere classified: Secondary | ICD-10-CM | POA: Insufficient documentation

## 2010-10-02 DIAGNOSIS — M25669 Stiffness of unspecified knee, not elsewhere classified: Secondary | ICD-10-CM | POA: Insufficient documentation

## 2010-10-02 DIAGNOSIS — M25569 Pain in unspecified knee: Secondary | ICD-10-CM | POA: Insufficient documentation

## 2010-10-02 DIAGNOSIS — IMO0001 Reserved for inherently not codable concepts without codable children: Secondary | ICD-10-CM | POA: Insufficient documentation

## 2010-10-02 DIAGNOSIS — M6281 Muscle weakness (generalized): Secondary | ICD-10-CM | POA: Insufficient documentation

## 2010-10-04 ENCOUNTER — Ambulatory Visit (HOSPITAL_COMMUNITY)
Admission: RE | Admit: 2010-10-04 | Discharge: 2010-10-04 | Disposition: A | Discharge status: Home / Self Care | Payer: Medicare Other | Source: Ambulatory Visit | Attending: *Deleted | Admitting: *Deleted

## 2010-10-09 ENCOUNTER — Ambulatory Visit (HOSPITAL_COMMUNITY)
Admission: RE | Admit: 2010-10-09 | Discharge: 2010-10-09 | Disposition: A | Discharge status: Home / Self Care | Payer: Medicare Other | Source: Ambulatory Visit | Attending: *Deleted | Admitting: *Deleted

## 2010-10-09 NOTE — H&P (Signed)
NAMEZAYLA, AGAR             ACCOUNT NO.:  [id number]   MEDICAL RECORD NO.:  [id number]          PATIENT TYPE:  AMB   LOCATION:  DAY                           FACILITY:  [facility]   PHYSICIAN:  Vickki Hearing, M.D.DATE OF BIRTH:  04/28/1956   DATE OF ADMISSION:  DATE OF DISCHARGE:  LH                              HISTORY & PHYSICAL   CHIEF COMPLAINTS:  Pain, swelling, left knee.   Sharon [name] presented to me on November 03, 2006 status post a fall,  complaining of left knee pain.  She is 55 years old.  She has a history  of right knee pain as well, treated with Synvisc injections, but at this  time is complaining of left knee pain, swelling.  She has had 2 or 3  aspirations and injections.  It did not improve.  Continues to have a  limp and limited range of motion.  She finally had an MRI which showed a  medial meniscal tear.  Clinical symptoms and examination are consistent  with torn medial meniscus.   She also tried Aleve, extra strength Tylenol, and Vicodin, but  eventually these did not relieve her pain as well.   She has a history of gastroesophageal reflux and takes Protonix.  Has a  history of pneumonia, migraines, joint pain, and joint swelling.   History of previous surgery including appendectomy, tonsillectomy,  hysterectomy.   Lung disease, cancer in the family history.   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   Her appearance is normal though somewhat overweight   She has normal peripheral vascular examination.   Lymph system is negative for enlargement.   Skin normal in all four extremities.   Neuro/psych exam shows normal sensation, mood, affect, alert and  oriented x3.   Musculoskeletal:  Gait and station show an obvious limp with a flexed  knee gait.  She has an effusion on the left.  Motion is about 125  degrees.  Left knee feels tight.  Ligamentous exam was normal.  Strength  was normal.   Medial joint line is tender.  McMurray's sign was positive.   IMPRESSION:  Torn medial meniscus, left knee; osteoarthritis, left knee,  as seen on MRI.   Recommend arthroscopy, left knee, partial medial meniscectomy.   As part of the informed consent process, we discussed the following:  Diagnosis:  Torn medial meniscus, osteoarthritis.  Treatment plan:  Arthroscopy, left knee, partial medial meniscectomy  most likely, and significant risks of bleeding, infection, stiffness,  continued pain from arthritis, retear of meniscus, blood clot.  Benefits:  Improved knee function.  Alternatives:  Continue with  nonoperative therapy.   The most important thing we discussed is that the patient has arthritis  will determine her outcome.  She may have continued pain because of the  arthritis.  The meniscal tear should be amenable to excision.   The appropriate consent process has taken place.  We advised the patient  of the relevant risks and benefits, realistic alternatives and expected  outcomes.  I gave the patient the  opportunity to ask questions.  I  answered the questions, and she agreed to surgery.  Please send a copy  [facility].      Vickki Hearing, M.D.  Electronically Signed     SEH/MEDQ  D:  12/18/2006  T:  12/18/2006  Job:  [id number]   cc:   [facility]

## 2010-10-09 NOTE — Discharge Summary (Signed)
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  She continued to have persistent  knee effusions and pain.  We did a bone scan to rule out back pathology  as a contributing factor was negative.  She was unable to work.  Had  persistent and disabling pain and presented for knee replacement versus  arthroscopic lavage.   ADMITTING DIAGNOSIS:  Osteoarthritis left knee.   DISCHARGE DIAGNOSIS:  Osteoarthritis left knee.   PROCEDURES:  She had a Psychologist, educational left total knee.  Used a size  #2 femur, size #2 tibia, size #16 highly cross-link polyethylene. The  patella was not resurfaced.  Surgeon was Dr. Romeo Apple assisted by [facility] and Shelly Rubenstein. Spinal anesthetic.  Operative findings:  Grade 4 lesion in the trochlea, normal patella, grade 2 lesion lateral  tibial plateau, grade 2 lesion medial femoral condyle, intact ACL, PCL,  intact lateral meniscus, previous meniscectomy, medial side partial  noted.   She did well after surgery.  She really had no problems.  She progressed  well in therapy.  She had adequate pain control.  Was afebrile.   LABORATORY DATA:  Lab results on discharge:  Hemoglobin 10.3.  INR 1.4.  Chem-7 normal.   DISCHARGE MEDICATIONS:  New medications will include Coumadin 2 mg h.s.  #6, Tylox one q.4 p.r.n. for pain #90.  She can resume all preop  medications including the following:  Protonix 40 mg b.i.d. w.c.,  Effexor 75 mg p.o. b.i.d.  Calcium carbonate one p.o. b.i.d. w.c..   The patient will have home health physical  therapy, no CPM due to the  size of her leg.  A CPM machine would not fit it.   She will come back to the office 14 days after surgery.   DISCHARGE CONDITION:  Disposition to home.  Overall condition stable.      Vickki Hearing, M.D.  Electronically Signed     SEH/MEDQ  D:  03/13/2007  T:  03/13/2007  Job:  [id number]

## 2010-10-09 NOTE — Op Note (Signed)
Sharon [name], Sharon [name]             ACCOUNT NO.:  [id number]   MEDICAL RECORD NO.:  [id number]          PATIENT TYPE:  AMB   LOCATION:  DAY                           FACILITY:  [facility]   PHYSICIAN:  Vickki Hearing, M.D.DATE OF BIRTH:  [birth year]   DATE OF PROCEDURE:  DATE OF DISCHARGE:                               OPERATIVE REPORT   [name] fell, injured her left knee, presented to me on June 9th  complaining of pain.  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  We aspirated the knee on 2-3 occasions, injected it an initial two  times.  She did get better.  The last time she did not.  She had an MRI  that showed a torn medial meniscus.  She also failed treatment, which  included Aleve, Extra Strength Tylenol, and Vicodin.   PREOPERATIVE DIAGNOSIS:  Torn medial meniscus and osteoarthritis of the  left knee.   POSTOPERATIVE DIAGNOSIS:  Torn medial meniscus and osteoarthritis of the  left knee.   PROCEDURE:  Arthroscopy, posterior medial meniscectomy.   SURGEON:  Vickki Hearing, M.D.   There were no assistants.   OPERATIVE FINDINGS:  The posterior horn of the medial meniscus was torn.  It was a parrot beak and then horizontal cleavage component, a somewhat  complex tear.  There were degenerative changes of the lateral tibial  plateau with a large fissure under the posterior horn of the lateral  meniscus.  There were grade 4 changes in the trochlea, grade 2 changes  in the medial facet of the patella, and there was some patellar  subluxation.   ANESTHESIA:  General.   SPECIMENS:  None.   BLOOD LOSS:  None.   COMPLICATIONS:  None.   SPONGE/NEEDLE COUNT:  Correct.   Patient went to PACU in good condition.   PROCEDURE:  [name] was identified as such in the preop holding  area.  The left knee was marked for surgery.  I counter-signed it.  The  history and physical had been updated.  She was taken back to surgery.  She had general anesthetic.  The  left leg was prepped with Durapep.  Sterile drape and technique was done.  A time-out procedure was  completed.   We entered the knee via lateral arthroscopy portal.  Established a  medial portal and then did a diagnostic arthroscopy.  We did a repeat  tour to look for further pathology, but the pathology was as stated  above.   We used the motorized shaver, straight duckbill forceps, and 50 degree  Arthricare wand to resect and balance the medial meniscus.  We used a  probe to confirm a stable rim.  We looked at all the other lesions in  the trochlea, lateral compartment, and they were stable lesions and did  not need further debridement.  The patellar subluxation was left, as  this was not a primary cause of the patient's symptomatology.   Patient will be discharged home on Vicodin and Phenergan and will follow  up on Monday or Tuesday.  Therapy can begin on Tuesday.  Vickki Hearing, M.D.  Electronically Signed     SEH/MEDQ  D:  12/19/2006  T:  12/20/2006  Job:  [id number]

## 2010-10-09 NOTE — Op Note (Signed)
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  She had persistent knee effusions and  pain.  She had a bone scan which ruled out any back pathology.  Because  of continued pain and inability to work the patient opted for knee  replacement versus arthroscopic lavage.   PREOPERATIVE DIAGNOSIS:  Osteoarthritis, left knee.   POSTOPERATIVE DIAGNOSIS:  Osteoarthritis, left knee.   OPERATION PERFORMED:  Left total knee arthroplasty:  Implants 2 femur, 2  tibia,  16 highly Cross-Link polyethylene from the [employer] total knee  system.  The patella was left as is.   SURGEON:  Vickki Hearing, M.D.   ASSISTANTS:  Deniece Portela McFatter and Toniann Fail __________.   ANESTHESIA:  Spinal.   OPERATIVE FINDINGS:  There was a grade 4 lesion of the trochlea.  Normal  patella.  Grade 2 lesion of the lateral tibial plateau.  Several grade 2  lesions of the medial femoral condyle.  Intact ACL and PCL.  Intact  lateral meniscus.  Partial medial meniscectomy had been performed prior.   DESCRIPTION OF THE OPERATION:  The patient was then identified in the  preop holding area.  The left knee was marked by the patient and  countersigned by the surgeon.  The history and physical were updated,  and the patient was taken to the operating room.   A spinal anesthetic was completed.  Foley catheter was placed.  Antibiotics were started.  The leg was prepped with DuraPrep and  draped  sterilely.  The time-out procedure was completed.  The limb was  exsanguinated with a 6-inch Esmarch  and the tourniquet 300  mmHg.   The knee was placed in flexion.  A midline incision was made followed by  medial arthrotomy.  The ACL and PCL, which were intact,  were resected.  The menisci were removed.  Operative findings included that the findings  as listed above.   The femur was addressed first.  A drill was used to open the femoral  canal.  The canal was irrigated, suctioned and a fluted guide rod was  passed in the canal.  The distal femoral cut was set for a 5 degrees  valgus cut with a 10-mm resection.  The cutting block was tendon 10 mm  of bone was resected from the more prominent condyle.  After checking  the cut for flatness the sizing guide was placed.  The femur was sized  to a size two.  We set the external rotation for 4 degrees, which  matched the epicondyles and then placed the 4 in 1 cutting block.  We  completed the four bone resections and then addressed the tibia.   The  tibia was subluxated forward.  Medial soft tissue sleeve was  elevated to the mid coronal plane.  The tibial spines were used as a  reference point and the cutting block was set for an 11 mm resection  from the tibial spine.  This was completed.  The tibia then measured a  size two.  With the femur and tibia both resections both made cut,  spacer blocks were placed.  A size 13 spacer block fit in the flexion  and the extension gap equally.  The box cut was then made on the femur.  The patella was checked.  It was intact.  It  measured 20 mm in  thickness and since there was no articular cartilage ware I decided to  leave it intact.   We next did a trial reduction.  We used a 13-mm block.  It was slightly  tight on the medial side.  We therefore did a further medial release.  This balanced the gaps nicely in flexion and extension.  We then marked  our tibial rotation and punched the tibia completing the bone  preparation   The knee was then irrigated, cleaned and suctioned dry.  Cement was  mixed and then applied to  the bone with the implants.  The cement was  allowed to cure.  The implants were cemented securely in place.  Excess  cement was removed.  We placed a 13-mm polyethylene tray and the tibia  still subluxated, so we removed that polyethylene and put and a 16,  which stabilized the knee.  Flexion was 115 and extension with zero.   We then closed by first injecting knee with 30 mL Sensorcaine with  epinephrine.  We closed with #1 Bralon in an interrupted fashion with  the knee in flexion.  There was no lateral release necessary.  We  inserted a pain pump catheter in the subcu tissue followed by 0-  and 2-  0 Monocryl sutures.  Skin was reapproximated with staples.   We took an x-ray.  AP and lateral x-ray showed the implants to be well  cemented and in good alignment, and balanced in extension.   We dressed the knee and took the patient to recovery   Postop plan is for routine knee replacement protocol.      Vickki Hearing, M.D.  Electronically Signed     SEH/MEDQ  D:  03/10/2007  T:  03/11/2007  Job:  [id number]

## 2010-10-11 ENCOUNTER — Ambulatory Visit (HOSPITAL_COMMUNITY)
Admission: RE | Admit: 2010-10-11 | Discharge: 2010-10-11 | Disposition: A | Discharge status: Home / Self Care | Payer: Medicare Other | Source: Ambulatory Visit | Attending: *Deleted | Admitting: *Deleted

## 2010-10-12 NOTE — Op Note (Signed)
[facility]  Patient:    Sharon [name], Sharon [name]                       MRN: [id number] Proc. Date: 10/02/99 Adm. Date:  [id number] Attending:  Stephenie Acres                           Operative Report  PREOPERATIVE DIAGNOSIS:  Right breast mass.  POSTOPERATIVE DIAGNOSIS:  Right breast mass.  OPERATION PERFORMED:  Excisional right breast biopsy.  SURGEON:  Stephenie Acres, M.D.  ANESTHESIA:  Local MAC.  DESCRIPTION OF PROCEDURE:  The patient was taken to the operating room and placed in supine position.  After adequate anesthesia was induced using MAC technique, the right breast was prepped and draped in normal sterile fashion. Using 1% lidocaine local anesthesia, the skin just lateral to the previous breast biopsy scar was anesthetized.  The subcutaneous tissue was also anesthetized.  A curvilinear incision was made.  I dissected down onto rather firm fibrotic tissue which was excised in its entirety.  Two small cysts were also encountered during the resection.  These were marked and sent for pathologic evaluation.  Adequate hemostasis was ensured.  The skin was closed with subcuticular 4-0 Monocryl.  Steri-Strips and sterile dressing was applied.  The patient tolerated the procedure well and went to PACU in good condition. DD:  10/02/99 TD:  10/03/99 Job: [id number]

## 2010-10-12 NOTE — Op Note (Signed)
NAMEMARANATHA, Sharon [name]             ACCOUNT NO.:  [id number]   MEDICAL RECORD NO.:  [id number]          PATIENT TYPE:  AMB   LOCATION:  DAY                           FACILITY:  [facility]   PHYSICIAN:  R. Roetta Sessions, M.D. DATE OF BIRTH:  [birth year]   DATE OF PROCEDURE:  08/08/2005  DATE OF DISCHARGE:                                 OPERATIVE REPORT   PROCEDURE:  Diagnostic esophagogastroduodenoscopy followed by screening  colonoscopy.   ENDOSCOPIST:  Gerrit Friends. Rourk, M.D.   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  Gallbladder ultrasound  and HIDA all negative.  LFTs, amylase, lipase, all came back negative. EGD  is now being done.  This lady tells me that for the past 2 weeks she has not  had any symptoms.  We are doing the EGD, today, to evaluate her luminal  upper GI tract. She also requests colonoscopy given she is at the threshold  age for screening.  She has no lower GI tract symptoms. There is no family  history of colorectal neoplasia.  She has never had her lower GI tract  imaged previously.   PROCEDURE NOTE:  O2 saturation, blood pressure, pulse and respirations were  monitored throughout the entire procedure.   CONSCIOUS SEDATION:  IV Versed and Demerol in incremental doses.  Cetacaine  spray for topical oropharyngeal anesthesia.   INSTRUMENT:  Olympus video chip system.   FINDINGS:  Examination of the tubular esophagus revealed no mucosal  abnormalities.  The EG junction was easily traversed.   STOMACH:  The gastric cavity was empty.  It insufflated well with air.  A  thorough examination of the gastric mucosa including a retroflex view of the  proximal stomach and esophagogastric junction demonstrated no mucosal  abnormalities.  The pylorus was patent and easily traversed.  Examination of  the bulb and  second portion revealed no abnormalities.   THERAPEUTIC/DIAGNOSTIC MANEUVERS:  None.   The patient tolerated the procedure well and was prepared for colonoscopy.  A digital rectal exam revealed no abnormalities.   ENDOSCOPIC FINDINGS:  The prep was good.   RECTUM:  Examination of the rectal mucosa including the retroflex view of  the anal verge revealed no abnormalities.   COLON:  The colonic mucosa was surveyed from the rectosigmoid junction  through the left transverse and right colon to the area of the appendiceal  orifice, ileocecal valve, and cecum.  These structures were well seen and  photographed for the record.   From this level the scope was slowly and cautiously withdrawn.  All  previously mentioned mucosal surfaces were again seen. The patient was noted  to have sigmoid diverticula.  The remainder of the colonic mucosa appeared  normal. The patient tolerated the procedure well and was reacted in  endoscopy.   EGD IMPRESSION:  Normal esophagus, stomach D1 and D2.   COLONOSCOPY FINDINGS:  1.  Normal rectum.  2.  Left-sided diverticula.  The remainder of the colonic mucosa appeared  normal.   RECOMMENDATIONS:  1.  Diverticulosis literature provided to [name].  2.  Repeat colonoscopy in 10 years.  3.  If the patient has recurrent, epigastric, right upper quadrant abdominal      pain, I have asked her to obtain STAT lab work including Amylase, Lipase      and LFTs 6 hours after the onset of symptoms.   It is very possible that this lady has sphincter of Oddi dysfunction to  account for her recent upper GI symptoms. We will plan to see this nice lady  back in 6 weeks.      Jonathon Bellows, M.D.  Electronically Signed     RMR/MEDQ  D:  08/08/2005  T:  08/09/2005  Job:  [id number]   cc:   Donna Bernard, M.D.  Fax: [phone]

## 2010-10-18 ENCOUNTER — Ambulatory Visit (HOSPITAL_COMMUNITY): Payer: Medicare Other | Admitting: *Deleted

## 2010-10-18 ENCOUNTER — Ambulatory Visit (HOSPITAL_COMMUNITY): Payer: Medicare Other | Admitting: Physical Therapy

## 2010-10-19 ENCOUNTER — Ambulatory Visit (HOSPITAL_COMMUNITY): Payer: Medicare Other | Admitting: Physical Therapy

## 2010-10-23 ENCOUNTER — Ambulatory Visit (HOSPITAL_COMMUNITY): Payer: Medicare Other | Admitting: Physical Therapy

## 2010-10-24 ENCOUNTER — Ambulatory Visit: Payer: Medicare Other | Admitting: Orthopedic Surgery

## 2010-10-24 ENCOUNTER — Encounter: Payer: Self-pay | Admitting: Orthopedic Surgery

## 2010-10-24 ENCOUNTER — Ambulatory Visit (INDEPENDENT_AMBULATORY_CARE_PROVIDER_SITE_OTHER): Payer: Medicare Other | Admitting: Orthopedic Surgery

## 2010-10-24 DIAGNOSIS — G5790 Unspecified mononeuropathy of unspecified lower limb: Secondary | ICD-10-CM | POA: Insufficient documentation

## 2010-10-24 DIAGNOSIS — G8929 Other chronic pain: Secondary | ICD-10-CM | POA: Insufficient documentation

## 2010-10-24 DIAGNOSIS — Z96659 Presence of unspecified artificial knee joint: Secondary | ICD-10-CM

## 2010-10-24 MED ORDER — PREDNISONE (PAK) 10 MG PO TABS: ORAL_TABLET | ORAL | Status: DC

## 2010-10-24 MED ORDER — HYDROCODONE-ACETAMINOPHEN 5-500 MG PO TABS: 1.0000 | ORAL_TABLET | ORAL | Status: DC | PRN

## 2010-10-24 NOTE — Progress Notes (Signed)
Post op visit  07/13/2010 poly exch. Remove loose body left TKA  C/O burning pain behind the knee and crepitance in the front of the knee   Exam findings: in the prone position there is tenderness over the lateral hamstring  Knee flexion remains normal there is patellofemoral crepitation   xrays show no complications   Impr mononeuritis   Plan DS steroid pack   Ret in 2 weeks

## 2010-10-24 NOTE — Progress Notes (Signed)
Separately identifiable. X-ray report.  Total knee replacement x-rays for pain knee   All 3 components are properly aligned. Overall knee alignment is normal. No signs of loosening.  Impression no complicating findings and his postoperative knee replacement film

## 2010-10-25 ENCOUNTER — Ambulatory Visit (HOSPITAL_COMMUNITY): Payer: Medicare Other | Admitting: Physical Therapy

## 2010-11-06 ENCOUNTER — Ambulatory Visit (INDEPENDENT_AMBULATORY_CARE_PROVIDER_SITE_OTHER): Payer: Medicare Other | Admitting: Orthopedic Surgery

## 2010-11-06 ENCOUNTER — Encounter: Payer: Self-pay | Admitting: Orthopedic Surgery

## 2010-11-06 DIAGNOSIS — IMO0002 Reserved for concepts with insufficient information to code with codable children: Secondary | ICD-10-CM

## 2010-11-06 DIAGNOSIS — M1710 Unilateral primary osteoarthritis, unspecified knee: Secondary | ICD-10-CM

## 2010-11-06 MED ORDER — DICLOFENAC-MISOPROSTOL 50-0.2 MG PO TABS: 1.0000 | ORAL_TABLET | Freq: Two times a day (BID) | ORAL | Status: DC

## 2010-11-06 NOTE — Progress Notes (Signed)
Post op visit  07/13/2010 poly exch. Remove loose body left TKA  C/O burning pain behind the knee and crepitance in the front of the knee  Exam findings: in the prone position there is tenderness over the lateral hamstring  Knee flexion remains normal there is patellofemoral crepitation  xrays show no complications  Impr mononeuritis  Plan DS steroid pack  Ret in 2 weeks  This is a followup visit.  Patient symptoms have Improved when she was on the prednisone but then started having symptoms again when she went off of it.  She takes ibuprofen 3 times a day 800 mg we discussed possibly changing and she agreed  I put her on diclofenac 50 mg twice a day take for one week to see if this helps better than the ibuprofen if not she will call us back and I will change her to a different medication

## 2010-11-06 NOTE — Patient Instructions (Signed)
Start new medication; stop ibuprofen

## 2010-11-27 ENCOUNTER — Other Ambulatory Visit: Payer: Self-pay | Admitting: Orthopedic Surgery

## 2010-11-27 DIAGNOSIS — M25569 Pain in unspecified knee: Secondary | ICD-10-CM

## 2010-11-27 MED ORDER — MISOPROSTOL 100 MCG PO TABS: 100.0000 ug | ORAL_TABLET | Freq: Four times a day (QID) | ORAL | Status: DC

## 2010-11-27 MED ORDER — DICLOFENAC POTASSIUM 50 MG PO TABS: 50.0000 mg | ORAL_TABLET | Freq: Two times a day (BID) | ORAL | Status: DC

## 2010-12-12 ENCOUNTER — Encounter: Payer: Self-pay | Admitting: Orthopedic Surgery

## 2010-12-12 ENCOUNTER — Ambulatory Visit (INDEPENDENT_AMBULATORY_CARE_PROVIDER_SITE_OTHER): Payer: Medicare Other | Admitting: Orthopedic Surgery

## 2010-12-12 DIAGNOSIS — M179 Osteoarthritis of knee, unspecified: Secondary | ICD-10-CM | POA: Insufficient documentation

## 2010-12-12 DIAGNOSIS — IMO0002 Reserved for concepts with insufficient information to code with codable children: Secondary | ICD-10-CM

## 2010-12-12 DIAGNOSIS — M1710 Unilateral primary osteoarthritis, unspecified knee: Secondary | ICD-10-CM | POA: Insufficient documentation

## 2010-12-12 NOTE — Progress Notes (Signed)
Routine followup visit   07/13/2010 poly exch. Remove loose body left TKA   Patient also followed for RIGHT knee arthritis  She also has lumbar disc disease with chronic pain and chronic radicular unexplained pain  Currently in stable condition.  She is on Arthrotec as a split dosing per her insurance plan with 2 different pills for the same medication  Her RIGHT knee is painful medially and laterally to palpation she is angulating with a cane she is still maintained excellent range of motion.  She appears abnormal strength in the limb and the knee seemed stable with a negative McMurray sign.  She is awake and alert she's on her mood and affect are normal her skin is intact  Impression #1 status post polyethylene exchange or removal of loose body LEFT knee for postoperative fracture #2 osteoarthritis RIGHT knee #3 lumbar disease with radicular pain unexplained  Continue the Arthrotec split medication dosing followup in a few months

## 2010-12-12 NOTE — Patient Instructions (Signed)
-   We will see you in 3 months!

## 2011-03-06 LAB — CBC
HCT: 30.1 — ABNORMAL LOW
HCT: 31.1 — ABNORMAL LOW
Hemoglobin: 10.3 — ABNORMAL LOW
MCV: 96.6
MCV: 96.6
MCV: 97.6
Platelets: 218
RBC: 3.22 — ABNORMAL LOW
RBC: 3.46 — ABNORMAL LOW
WBC: 7.9
WBC: 8.1
WBC: 8.3

## 2011-03-06 LAB — BASIC METABOLIC PANEL
BUN: 3 — ABNORMAL LOW
Calcium: 8.5
Chloride: 101
Chloride: 106
Chloride: 108
Creatinine, Ser: 0.75
GFR calc Af Amer: >60
GFR calc Af Amer: >60
Glucose, Bld: 127 — ABNORMAL HIGH
Potassium: 3.7
Potassium: 3.9
Sodium: 141
Sodium: 141

## 2011-03-06 LAB — DIFFERENTIAL
Eosinophils Absolute: 0.2
Eosinophils Absolute: 0.3
Eosinophils Relative: 2
Eosinophils Relative: 4
Lymphocytes Relative: 19
Lymphocytes Relative: 20
Lymphocytes Relative: 23
Lymphs Abs: 1.6
Lymphs Abs: 1.6
Lymphs Abs: 1.8
Monocytes Absolute: 1 — ABNORMAL HIGH
Monocytes Relative: 16 — ABNORMAL HIGH
Monocytes Relative: 16 — ABNORMAL HIGH
Neutro Abs: 5.1
Neutrophils Relative %: 63

## 2011-03-06 LAB — PROTIME-INR
INR: 1
INR: 1.4
Prothrombin Time: 18 — ABNORMAL HIGH

## 2011-03-07 LAB — CBC
HCT: 36.9
Hemoglobin: 12.4
MCHC: 33.8
MCV: 97.1
Platelets: 235
RDW: 12.2

## 2011-03-07 LAB — CROSSMATCH: Antibody Screen: NEGATIVE

## 2011-03-07 LAB — DIFFERENTIAL
Basophils Absolute: 0
Eosinophils Absolute: 0.2
Eosinophils Relative: 5
Lymphocytes Relative: 44
Monocytes Absolute: 0.7

## 2011-03-07 LAB — BASIC METABOLIC PANEL
BUN: 11
CO2: 29
Chloride: 105
Glucose, Bld: 90
Potassium: 4.1
Sodium: 141

## 2011-03-07 LAB — ABO/RH: ABO/RH(D): B NEG

## 2011-03-07 LAB — PROTIME-INR: Prothrombin Time: 12.2

## 2011-03-11 LAB — BASIC METABOLIC PANEL
CO2: 30
Calcium: 8.8
Creatinine, Ser: 0.73
GFR calc Af Amer: >60
GFR calc non Af Amer: >60
Sodium: 139

## 2011-03-11 LAB — HEMOGLOBIN AND HEMATOCRIT, BLOOD
HCT: 37.3
Hemoglobin: 12.7

## 2011-03-19 ENCOUNTER — Ambulatory Visit: Payer: Medicare Other | Admitting: Orthopedic Surgery

## 2011-03-20 ENCOUNTER — Encounter: Payer: Self-pay | Admitting: Orthopedic Surgery

## 2011-03-20 ENCOUNTER — Ambulatory Visit (INDEPENDENT_AMBULATORY_CARE_PROVIDER_SITE_OTHER): Payer: Medicare Other | Admitting: Orthopedic Surgery

## 2011-03-20 DIAGNOSIS — G8929 Other chronic pain: Secondary | ICD-10-CM

## 2011-03-20 DIAGNOSIS — M25562 Pain in left knee: Secondary | ICD-10-CM | POA: Insufficient documentation

## 2011-03-20 DIAGNOSIS — M25569 Pain in unspecified knee: Secondary | ICD-10-CM

## 2011-03-20 MED ORDER — HYDROCODONE-ACETAMINOPHEN 5-500 MG PO TABS: 1.0000 | ORAL_TABLET | ORAL | Status: DC | PRN

## 2011-03-20 NOTE — Patient Instructions (Addendum)
Stop arthrotec misoprostol   Start ibuprofen 600 mg tid   Continue vicodin as needed

## 2011-03-20 NOTE — Progress Notes (Signed)
Followup  Status post LEFT total knee.  RIGHT knee arthritis.  RIGHT knee continues to give out, causing constant pain and giving way. Most of the pain is medial. The patient is driving, functioning fairly well. Other than swelling in the LEFT knee, when she's been on it for a long time with medial pain.  The RIGHT knee is stable. Flexion is 120, collateral ligaments, stable. Crepitance in the patellofemoral joint medial joint line tenderness.  LEFT knee tendinitis medial tendon exposed tendons. Swelling tenderness.  Last x-ray of RIGHT knee showed arthritis progressing compared to previous film from 2008  Not ready for knee replacement at this time.  Her Arthrotec combination medication didn't work well for her so we will resume the ibuprofen 600 t.i.d. Continue proton pump inhibitor. Continue Vicodin 3 times a day.  3 month followup

## 2011-04-25 ENCOUNTER — Other Ambulatory Visit: Payer: Self-pay | Admitting: Family Medicine

## 2011-04-25 DIAGNOSIS — Z1231 Encounter for screening mammogram for malignant neoplasm of breast: Secondary | ICD-10-CM

## 2011-05-07 ENCOUNTER — Ambulatory Visit (INDEPENDENT_AMBULATORY_CARE_PROVIDER_SITE_OTHER): Payer: Medicare Other | Admitting: Orthopedic Surgery

## 2011-05-07 ENCOUNTER — Encounter: Payer: Self-pay | Admitting: Orthopedic Surgery

## 2011-05-07 ENCOUNTER — Ambulatory Visit: Payer: Medicare Other | Admitting: Orthopedic Surgery

## 2011-05-07 VITALS — BP 122/70 | Ht 60.0 in | Wt 231.0 lb

## 2011-05-07 DIAGNOSIS — M1710 Unilateral primary osteoarthritis, unspecified knee: Secondary | ICD-10-CM

## 2011-05-07 NOTE — Progress Notes (Signed)
A RIGHT knee evaluation  The patient reports continued RIGHT knee pain with frequent falls.  She also reports difficulty going up and down steps.  Previous treatment includes current anti-inflammatories, oral analgesics including hydrocodone.  She is also using a cane.  She's had physical therapy in the past with no success.  She indicates that she is ready to proceed with total knee replacement.  She understands risk and benefits of the procedure.  We'll arrange the procedure and finished the hospital require history and physical prior to surgery.  Radiographs were taken today which show joint space narrowing of the medial compartment with mild varus deformity approximately 8.  There is surrounding osteophytes of the joint medially posteriorly and inferior to the patella.  Impression osteoarthritis of the RIGHT knee  Plan RIGHT total knee replacement  X-ray report 3 views of the RIGHT knee.  Indication osteoarthritis, pain in preoperative x-ray evaluation  Findings: There is mild varus deformity of the knee with medial joint space narrowing moderate to severe.  There is surrounding osteophytes especially the medial side of the joint as well as the posterior part of the joint and the inferior portion of the patella.  Patella centered.  The patella measured 21 mm in depth.  Impression osteoarthritis RIGHT knee moderate to severe

## 2011-05-07 NOTE — Patient Instructions (Signed)
You have been scheduled for surgery.  All surgeries carry some risk.  Remember you always have the option of continued nonsurgical treatment. However in this situation the risks vs. the benefits favor surgery as the best treatment option. The risks of the surgery includes the following but is not limited to bleeding, infection, pulmonary embolus, death from anesthesia, nerve injury vascular injury or need for further surgery, continued pain.  Specific to this procedure the following risks and complications are rare but possible Stiffness, pain  Swelling

## 2011-05-08 ENCOUNTER — Other Ambulatory Visit: Payer: Self-pay | Admitting: *Deleted

## 2011-05-09 ENCOUNTER — Telehealth: Payer: Self-pay | Admitting: Orthopedic Surgery

## 2011-05-09 NOTE — Telephone Encounter (Signed)
Call received from nurse Geoffery Spruce [facility], ph [phone]. States received our fax re: upcoming total knee surgery scheduled for patient for 05/27/11.  Said no signed orders received.  I explained that we contact them with advance notice and that orders will follow.  Please advise.

## 2011-05-10 NOTE — Telephone Encounter (Signed)
The information given at time of call was correct

## 2011-05-14 ENCOUNTER — Encounter (HOSPITAL_COMMUNITY): Payer: Self-pay | Admitting: Pharmacy Technician

## 2011-05-16 ENCOUNTER — Ambulatory Visit
Admission: RE | Admit: 2011-05-16 | Discharge: 2011-05-16 | Disposition: A | Discharge status: Home / Self Care | Payer: Medicare Other | Source: Ambulatory Visit | Attending: Family Medicine | Admitting: Family Medicine

## 2011-05-16 DIAGNOSIS — Z1231 Encounter for screening mammogram for malignant neoplasm of breast: Secondary | ICD-10-CM

## 2011-05-17 ENCOUNTER — Telehealth: Payer: Self-pay | Admitting: Orthopedic Surgery

## 2011-05-17 NOTE — Telephone Encounter (Signed)
RE: in-patient surgery scheduled 05/27/11 at [facility], total knee arthroplasty CPT [phone], No pre-authorization required for surgery per Medicare guidelines.

## 2011-05-22 ENCOUNTER — Encounter (HOSPITAL_COMMUNITY): Payer: Self-pay

## 2011-05-22 ENCOUNTER — Encounter (HOSPITAL_COMMUNITY)
Admission: RE | Admit: 2011-05-22 | Discharge: 2011-05-22 | Disposition: A | Discharge status: Home / Self Care | Payer: Medicare Other | Source: Ambulatory Visit | Attending: Orthopedic Surgery | Admitting: Orthopedic Surgery

## 2011-05-22 HISTORY — DX: Reserved for concepts with insufficient information to code with codable children: IMO0002

## 2011-05-22 HISTORY — DX: Carpal tunnel syndrome, unspecified upper limb: G56.00

## 2011-05-22 LAB — CBC
Hemoglobin: 13.2 g/dL (ref 12.0–15.0)
MCH: 32.8 pg (ref 26.0–34.0)
MCHC: 34.1 g/dL (ref 30.0–36.0)
RDW: 12.9 % (ref 11.5–15.5)

## 2011-05-22 LAB — BASIC METABOLIC PANEL
BUN: 15 mg/dL (ref 6–23)
Creatinine, Ser: 0.73 mg/dL (ref 0.50–1.10)
GFR calc Af Amer: >90 mL/min (ref >90)
GFR calc non Af Amer: >90 mL/min (ref >90)
Glucose, Bld: 130 mg/dL — ABNORMAL HIGH (ref 70–99)

## 2011-05-22 LAB — PROTIME-INR: INR: 0.97 (ref 0.00–1.49)

## 2011-05-22 LAB — APTT: aPTT: 32 seconds (ref 24–37)

## 2011-05-22 LAB — SURGICAL PCR SCREEN: MRSA, PCR: NEGATIVE

## 2011-05-22 LAB — PREPARE RBC (CROSSMATCH)

## 2011-05-22 MED ORDER — CHLORHEXIDINE GLUCONATE 4 % EX LIQD
60.0000 mL | Freq: Once | CUTANEOUS | Status: DC
  Filled 2011-05-22: qty 60

## 2011-05-22 NOTE — Patient Instructions (Signed)
19 Sharon [name]  05/22/2011   Your procedure is scheduled on:  05/27/2011  Report to [facility] at 08:45 AM.  Call this number if you have problems the morning of surgery: [phone]   Remember:   Do not eat food:After Midnight.  May have clear liquids:until Midnight .  Clear liquids include soda, tea, black coffee, apple or grape juice, broth.  Take these medicines the morning of surgery with A SIP OF WATER: Protonix and Vicodin (if needed)   Do not wear jewelry, make-up or nail polish.  Do not wear lotions, powders, or perfumes. You may wear deodorant.  Do not shave 48 hours prior to surgery.  Do not bring valuables to the hospital.  Contacts, dentures or bridgework may not be worn into surgery.  Leave suitcase in the car. After surgery it may be brought to your room.  For patients admitted to the hospital, checkout time is 11:00 AM the day of discharge.   Patients discharged the day of surgery will not be allowed to drive home.  Name and phone number of your driver:   Special Instructions: CHG Shower Use Special Wash: 1/2 bottle night before surgery and 1/2 bottle morning of surgery.   Please read over the following fact sheets that you were given: Pain Booklet, Total Joint Packet, MRSA Information, Surgical Site Infection Prevention, Anesthesia Post-op Instructions and Care and Recovery After Surgery     Total Knee Replacement In total knee replacement, the damaged knee is replaced with an artificial knee joint (prosthesis). The purpose of this surgery is to reduce pain and improve your range of motion. Regaining a near normal range of motion of your knee in the first 3 to 6 weeks after surgery is critical. Generally, these artificial joints last a minimum of 10 years. By that time, about 1 in 10 patients will need another surgery to repair the loose prosthesis. LET YOUR CAREGIVER KNOW ABOUT:   Allergies to food or medicine.   Medicines taken, including vitamins, herbs,  eyedrops, over-the-counter medicines, and creams.   Use of steroids (by mouth or creams).   Previous problems with anesthetics or numbing medicines.   History of bleeding problems or blood clots.   Previous surgery.   Other health problems, including diabetes and kidney problems.   Possibility of pregnancy, if this applies.  RISKS AND COMPLICATIONS   Knee pain.   Loss of range of motion of the knee (stiffness) or instability.   Loosening of the prosthesis.   Infection.  BEFORE THE PROCEDURE   If you smoke, quit.   You may need a replacement or addition of blood (transfusion) during this procedure. You may want to donate your own blood for storage several weeks before the procedure. This way, your own blood can be stored and given to you if needed. Talk to your surgeon about this option.   Do not eat or drink anything for as long as directed by your caregiver before the procedure.   You may bathe and brush your teeth before the procedure. Do not swallow the water when brushing your teeth.   Scrub the area to be operated on for 5 minutes in the morning before the procedure. Use special soap if you are directed to do so by your caregiver.   Take your regular medicines with a small sip of water unless otherwise instructed. Your caregiver will let you know if there are medicines that need to be stopped and for how long.   You should  be present 60 minutes before your procedure or as directed by your caregiver.  PROCEDURE  Before surgery, an intravenous (IV) access for giving fluids will be started. You will be given medicines and/or gas to make you sleep (anesthetic). You may be given medicines in your back with a needle to make you numb from the waist down. Your surgeon will take out any damaged cartilage and bone. He or she will then put in new metal, plastic, or ceramic joint surfaces to restore alignment and function to your knee. AFTER THE PROCEDURE  You will be taken to the  recovery area where a nurse will watch and check your progress. You may have a long, narrow tube (catheter) in your bladder after surgery. The catheter helps you empty your bladder (pass your urine). You may have drainage tubes coming out from under the dressing. These tubes attach to a device that removes blood or fluids that gather after surgery. Once you are awake, stable, and taking fluids well, you will be returned to your room. You will receive physical therapy as prescribed by your caregiver. If you do not have help at home, you may need to go to an extended care facility for a few weeks. If you are sent home with a continuous passive motion (CPM) machine, use it as instructed. Document Released: 08/19/2000 Document Revised: 01/23/2011 Document Reviewed: 03/15/2009 [facility] Patient Information 2012 [place], [place].

## 2011-05-26 NOTE — H&P (Signed)
Sharon [name] is an 55 y.o. female.   Chief Complaint: right knee pain  HPI:The patient reports continued RIGHT knee pain with frequent falls. She also reports difficulty going up and down steps. Previous treatment includes current anti-inflammatories, oral analgesics including hydrocodone. She is also using a cane. She's had physical therapy in the past with no success. She indicates that she is ready to proceed with total knee replacement.  She understands risk and benefits of the procedure.   Radiographs were taken today which show joint space narrowing of the medial compartment with mild varus deformity approximately 8. There is surrounding osteophytes of the joint medially posteriorly and inferior to the patella.  Impression osteoarthritis of the RIGHT knee   Plan RIGHT total knee replacement  X-ray report 3 views of the RIGHT knee. Indication osteoarthritis, pain in preoperative x-ray evaluation  Findings: There is mild varus deformity of the knee with medial joint space narrowing moderate to severe. There is surrounding osteophytes especially the medial side of the joint as well as the posterior part of the joint and the inferior portion of the patella. Patella centered. The patella measured 21 mm in depth.  Impression osteoarthritis RIGHT knee moderate to severe   Past Medical History  Diagnosis Date  . GERD (gastroesophageal reflux disease)   . Pneumonia   . Migraine   . Melanoma   . Carpal tunnel syndrome     bilateral  . Arthritis   . Degenerative disk disease     mainly back    Past Surgical History  Procedure Date  . Appendectomy   . Tonsillectomy   . Vesicovaginal fistula closure w/ tah   . Total knee arthroplasty 2008    left   . Total knee revision 2.12.2012    left  . Menisus repair on rt knee   . Abdominal hysterectomy   . Tubal ligation   . Breast biopsy     right    Family History  Problem Relation Age of Onset  . Cancer      family history   .  Lung disease      family history   . Anesthesia problems Neg Hx   . Hypotension Neg Hx   . Malignant hyperthermia Neg Hx   . Pseudochol deficiency Neg Hx    Social History:  reports that she quit smoking about 5 years ago. Her smoking use included Cigarettes. She has a 20 pack-year smoking history. She does not have any smokeless tobacco history on file. She reports that she drinks alcohol. She reports that she does not use illicit drugs.  Allergies:  Allergies  Allergen Reactions  . Latex Other (See Comments)    Whelps   . Triple Antibiotic     REACTION: contact dematitis    No current facility-administered medications on file as of .   Medications Prior to Admission  Medication Sig Dispense Refill  . Calcium Carbonate-Vitamin D (CALCIUM + D) 600-200 MG-UNIT TABS Take by mouth 2 (two) times daily.        . fish oil-omega-3 fatty acids 1000 MG capsule Take 2 g by mouth daily.       . furosemide (LASIX) 20 MG tablet Take 20 mg by mouth 2 (two) times daily.        Marland Kitchen HYDROcodone-acetaminophen (VICODIN) 5-500 MG per tablet Take 1 tablet by mouth every 4 (four) hours as needed. Pain       . potassium chloride SA (KLOR-CON M20) 20 MEQ tablet Take  20 mEq by mouth 2 (two) times daily.          No results found for this or any previous visit (from the past 48 hour(s)). No results found.  Review of Systems  Musculoskeletal: Positive for myalgias, back pain, joint pain and falls.  All other systems reviewed and are negative.    Physical Exam  Constitutional: Vital signs are normal. She appears well-developed and well-nourished. No distress.  HENT:  Head: Normocephalic and atraumatic.  Neck: Trachea normal, normal range of motion and full passive range of motion without pain. Neck supple. No JVD present. No spinous process tenderness and no muscular tenderness present. Carotid bruit is not present. No tracheal deviation and normal range of motion present. No thyromegaly present.    Cardiovascular: Normal rate and regular rhythm.   Respiratory: Effort normal. No respiratory distress.  GI: Soft. Normal appearance and bowel sounds are normal.  Musculoskeletal:       Right shoulder: Normal.       Left shoulder: Normal.       Right elbow: Normal.      Left elbow: Normal.       Right wrist: Normal.       Left wrist: Normal.       Right hip: Normal.       Left hip: Normal.       Right knee: She exhibits decreased range of motion, deformity, abnormal alignment and bony tenderness. She exhibits no swelling, no effusion, no ecchymosis, no laceration, no erythema, no LCL laxity, normal patellar mobility, normal meniscus and no MCL laxity. tenderness found. Medial joint line and lateral joint line tenderness noted. No MCL, no LCL and no patellar tendon tenderness noted.       Left knee: She exhibits decreased range of motion. She exhibits no swelling, no effusion, no deformity, normal alignment, no LCL laxity and no MCL laxity. no tenderness found.       Right ankle: Normal.       Left ankle: Normal.       Lumbar back: She exhibits decreased range of motion, tenderness and pain.  Lymphadenopathy:    She has no cervical adenopathy.       Right: No inguinal adenopathy present.       Left: No inguinal adenopathy present.  Neurological: She is alert. She has normal strength. No sensory deficit. Coordination normal.  Skin: Skin is warm, dry and intact. No pallor.  Psychiatric: She has a normal mood and affect. Her speech is normal and behavior is normal. Judgment and thought content normal. Her mood appears not anxious. Cognition and memory are normal.     Assessment/Plan RIGHT KNEE OA  Plan RIGHT total knee replacement  X-ray report 3 views of the RIGHT knee. Indication osteoarthritis, pain in preoperative x-ray evaluation  Findings: There is mild varus deformity of the knee with medial joint space narrowing moderate to severe. There is surrounding osteophytes especially the  medial side of the joint as well as the posterior part of the joint and the inferior portion of the patella. Patella centered. The patella measured 21 mm in depth.  Impression osteoarthritis RIGHT knee moderate to severe   Fuller Canada 05/26/2011, 9:42 AM

## 2011-05-27 ENCOUNTER — Encounter (HOSPITAL_COMMUNITY): Payer: Self-pay | Admitting: Anesthesiology

## 2011-05-27 ENCOUNTER — Ambulatory Visit (HOSPITAL_COMMUNITY)
Admission: RE | Admit: 2011-05-27 | Discharge: 2011-05-27 | Disposition: A | Discharge status: Home / Self Care | Payer: Medicare Other | Source: Ambulatory Visit | Attending: Orthopedic Surgery | Admitting: Orthopedic Surgery

## 2011-05-27 ENCOUNTER — Encounter (HOSPITAL_COMMUNITY)
Admission: RE | Disposition: A | Discharge status: Home / Self Care | Payer: Self-pay | Source: Ambulatory Visit | Attending: Orthopedic Surgery

## 2011-05-27 ENCOUNTER — Encounter (HOSPITAL_COMMUNITY): Payer: Self-pay | Admitting: *Deleted

## 2011-05-27 DIAGNOSIS — Z01812 Encounter for preprocedural laboratory examination: Secondary | ICD-10-CM | POA: Insufficient documentation

## 2011-05-27 DIAGNOSIS — IMO0002 Reserved for concepts with insufficient information to code with codable children: Secondary | ICD-10-CM | POA: Insufficient documentation

## 2011-05-27 DIAGNOSIS — M1710 Unilateral primary osteoarthritis, unspecified knee: Secondary | ICD-10-CM | POA: Insufficient documentation

## 2011-05-27 DIAGNOSIS — Z5309 Procedure and treatment not carried out because of other contraindication: Secondary | ICD-10-CM | POA: Insufficient documentation

## 2011-05-27 SURGERY — ARTHROPLASTY, KNEE, TOTAL
Anesthesia: Spinal | Laterality: Right

## 2011-05-27 MED ORDER — CEFAZOLIN SODIUM-DEXTROSE 2-3 GM-% IV SOLR: 2.0000 g | INTRAVENOUS | Status: DC

## 2011-05-27 MED ORDER — CEFAZOLIN SODIUM-DEXTROSE 2-3 GM-% IV SOLR
INTRAVENOUS | Status: AC
  Filled 2011-05-27: qty 50

## 2011-05-27 SURGICAL SUPPLY — 64 items
BAG HAMPER (MISCELLANEOUS) IMPLANT
BANDAGE ELASTIC 4 VELCRO NS (GAUZE/BANDAGES/DRESSINGS) IMPLANT
BANDAGE ELASTIC 6 VELCRO NS (GAUZE/BANDAGES/DRESSINGS) IMPLANT
BANDAGE ESMARK 6X9 LF (GAUZE/BANDAGES/DRESSINGS) IMPLANT
BIT DRILL 3.2X128 (BIT) IMPLANT
BLADE HEX COATED 2.75 (ELECTRODE) IMPLANT
BLADE SAG 18X100X1.27 (BLADE) IMPLANT
BLADE SAGITTAL 25.0X1.27X90 (BLADE) IMPLANT
BLADE SAW SAG 90X13X1.27 (BLADE) IMPLANT
BLADE SURG SZ10 CARB STEEL (BLADE) IMPLANT
BNDG ESMARK 6X9 LF (GAUZE/BANDAGES/DRESSINGS)
BOWL SMART MIX CTS (DISPOSABLE) IMPLANT
CATH KIT ON Q 2.5IN SLV (PAIN MANAGEMENT) IMPLANT
CLOTH BEACON ORANGE TIMEOUT ST (SAFETY) IMPLANT
COOLER CRYO CUFF IC AND MOTOR (MISCELLANEOUS) IMPLANT
COVER LIGHT HANDLE STERIS (MISCELLANEOUS) IMPLANT
COVER PROBE W GEL 5X96 (DRAPES) IMPLANT
CUFF CRYO KNEE LG 20X31 COOLER (ORTHOPEDIC SUPPLIES) IMPLANT
CUFF CRYO KNEE18X23 MED (MISCELLANEOUS) IMPLANT
CUFF TOURNIQUET SINGLE 34IN LL (TOURNIQUET CUFF) IMPLANT
CUFF TOURNIQUET SINGLE 44IN (TOURNIQUET CUFF) IMPLANT
DECANTER SPIKE VIAL GLASS SM (MISCELLANEOUS) IMPLANT
DRAPE BACK TABLE (DRAPES) IMPLANT
DRAPE EXTREMITY T 121X128X90 (DRAPE) IMPLANT
DRAPE U-SHAPE 47X51 STRL (DRAPES) IMPLANT
DRESSING ALLEVYN BORDER HEEL (GAUZE/BANDAGES/DRESSINGS) IMPLANT
DRSG MEPILEX BORDER 4X12 (GAUZE/BANDAGES/DRESSINGS) IMPLANT
DURAPREP 26ML APPLICATOR (WOUND CARE) IMPLANT
ELECT REM PT RETURN 9FT ADLT (ELECTROSURGICAL)
ELECTRODE REM PT RTRN 9FT ADLT (ELECTROSURGICAL) IMPLANT
FACESHIELD LNG OPTICON STERILE (SAFETY) IMPLANT
GLOVE OPTIFIT SS 8.0 STRL (GLOVE) IMPLANT
GLOVE SKINSENSE NS SZ8.0 LF (GLOVE)
GLOVE SKINSENSE STRL SZ8.0 LF (GLOVE) IMPLANT
GLOVE SS N UNI LF 8.5 STRL (GLOVE) IMPLANT
GOWN STRL REIN XL XLG (GOWN DISPOSABLE) IMPLANT
HANDPIECE INTERPULSE COAX TIP (DISPOSABLE)
HOOD W/PEELAWAY (MISCELLANEOUS) IMPLANT
INST SET MAJOR BONE (KITS) IMPLANT
IV NS IRRIG 3000ML ARTHROMATIC (IV SOLUTION) IMPLANT
KIT BLADEGUARD II DBL (SET/KITS/TRAYS/PACK) IMPLANT
KIT ROOM TURNOVER APOR (KITS) IMPLANT
MANIFOLD NEPTUNE II (INSTRUMENTS) IMPLANT
MARKER SKIN DUAL TIP RULER LAB (MISCELLANEOUS) IMPLANT
NEEDLE HYPO 21X1.5 SAFETY (NEEDLE) IMPLANT
NS IRRIG 1000ML POUR BTL (IV SOLUTION) IMPLANT
PACK TOTAL JOINT (CUSTOM PROCEDURE TRAY) IMPLANT
PAD ARMBOARD 7.5X6 YLW CONV (MISCELLANEOUS) IMPLANT
PAD DANNIFLEX CPM (ORTHOPEDIC SUPPLIES) IMPLANT
PIN TROCAR 3 INCH (PIN) IMPLANT
SET BASIN LINEN APH (SET/KITS/TRAYS/PACK) IMPLANT
SET HNDPC FAN SPRY TIP SCT (DISPOSABLE) IMPLANT
SPONGE GAUZE 4X4 12PLY (GAUZE/BANDAGES/DRESSINGS) IMPLANT
STAPLER VISISTAT 35W (STAPLE) IMPLANT
SUT BRALON NAB BRD #1 30IN (SUTURE) IMPLANT
SUT MON AB 0 CT1 (SUTURE) IMPLANT
SUT MON AB 2-0 CT1 36 (SUTURE) IMPLANT
SYR 30ML LL (SYRINGE) IMPLANT
SYR BULB IRRIGATION 50ML (SYRINGE) IMPLANT
TOWEL OR 17X26 4PK STRL BLUE (TOWEL DISPOSABLE) IMPLANT
TOWER CARTRIDGE SMART MIX (DISPOSABLE) IMPLANT
TRAY FOLEY CATH 14FR (SET/KITS/TRAYS/PACK) IMPLANT
WATER STERILE IRR 1000ML POUR (IV SOLUTION) IMPLANT
YANKAUER SUCT 12FT TUBE ARGYLE (SUCTIONS) IMPLANT

## 2011-05-27 NOTE — Progress Notes (Signed)
Pt surgery cancelled due to treatment with Tamiflu and antibiotic for questionable flu type symptoms. Office will reschedule.

## 2011-05-27 NOTE — Anesthesia Preprocedure Evaluation (Deleted)
Anesthesia Evaluation Anesthesia Physical Anesthesia Plan Anesthesia Quick Evaluation  

## 2011-05-28 NOTE — Discharge Summary (Signed)
  Surgery cancelled 2nd to flu and antibiotics for sinusitis

## 2011-05-29 LAB — TYPE AND SCREEN
ABO/RH(D): B NEG
Unit division: 0

## 2011-05-30 ENCOUNTER — Telehealth: Payer: Self-pay | Admitting: Orthopedic Surgery

## 2011-05-30 ENCOUNTER — Other Ambulatory Visit: Payer: Self-pay | Admitting: Family Medicine

## 2011-05-30 DIAGNOSIS — R928 Other abnormal and inconclusive findings on diagnostic imaging of breast: Secondary | ICD-10-CM

## 2011-05-30 NOTE — Telephone Encounter (Signed)
Said her knee surgery that was scheduled for 05/27/11 was cancell

## 2011-05-30 NOTE — Telephone Encounter (Signed)
Left message for patient to call office.  

## 2011-05-30 NOTE — Telephone Encounter (Signed)
Patient called back and given doctor's reply

## 2011-05-30 NOTE — Telephone Encounter (Signed)
IF DR CLEARS HER AND SHE IS OFF ALL ANTIBIOTICS

## 2011-05-30 NOTE — Telephone Encounter (Signed)
No note/mistake/bsf

## 2011-05-30 NOTE — Telephone Encounter (Signed)
Said her surgery that was scheduled for 05/27/11 was cancelled because she had a sinus infection.  She is scheduled to see her pcp for a recheck  for the sinus infection.  She is asking if she needs to come back to see you to get rescheduled for the knee surgery.  Also asking if you think she can have the surgery on 06/07/11.

## 2011-06-03 ENCOUNTER — Other Ambulatory Visit: Payer: Self-pay | Admitting: Orthopedic Surgery

## 2011-06-04 ENCOUNTER — Encounter (HOSPITAL_COMMUNITY): Payer: Self-pay | Admitting: Orthopedic Surgery

## 2011-06-05 ENCOUNTER — Ambulatory Visit
Admission: RE | Admit: 2011-06-05 | Discharge: 2011-06-05 | Disposition: A | Discharge status: Home / Self Care | Payer: Medicare Other | Source: Ambulatory Visit | Attending: Family Medicine | Admitting: Family Medicine

## 2011-06-05 DIAGNOSIS — R928 Other abnormal and inconclusive findings on diagnostic imaging of breast: Secondary | ICD-10-CM

## 2011-06-10 ENCOUNTER — Ambulatory Visit: Payer: Medicare Other | Admitting: Orthopedic Surgery

## 2011-06-12 ENCOUNTER — Encounter (HOSPITAL_COMMUNITY): Payer: Self-pay | Admitting: Pharmacy Technician

## 2011-06-13 ENCOUNTER — Encounter (HOSPITAL_COMMUNITY)
Admission: RE | Admit: 2011-06-13 | Discharge: 2011-06-13 | Disposition: A | Discharge status: Home / Self Care | Payer: Medicare Other | Source: Ambulatory Visit | Attending: Orthopedic Surgery | Admitting: Orthopedic Surgery

## 2011-06-13 ENCOUNTER — Encounter (HOSPITAL_COMMUNITY): Payer: Self-pay

## 2011-06-13 LAB — DIFFERENTIAL
Basophils Absolute: 0 10*3/uL (ref 0.0–0.1)
Lymphocytes Relative: 51 % — ABNORMAL HIGH (ref 12–46)
Monocytes Absolute: 0.7 10*3/uL (ref 0.1–1.0)
Neutro Abs: 2.3 10*3/uL (ref 1.7–7.7)
Neutrophils Relative %: 34 % — ABNORMAL LOW (ref 43–77)

## 2011-06-13 LAB — TYPE AND SCREEN: Unit division: 0

## 2011-06-13 LAB — PROTIME-INR
INR: 0.95 (ref 0.00–1.49)
Prothrombin Time: 12.9 seconds (ref 11.6–15.2)

## 2011-06-13 LAB — CBC
HCT: 39.4 % (ref 36.0–46.0)
Platelets: 278 10*3/uL (ref 150–400)
RDW: 12.9 % (ref 11.5–15.5)
WBC: 6.6 10*3/uL (ref 4.0–10.5)

## 2011-06-13 LAB — PREPARE RBC (CROSSMATCH)

## 2011-06-13 LAB — APTT: aPTT: 29 seconds (ref 24–37)

## 2011-06-13 LAB — BASIC METABOLIC PANEL
Chloride: 104 mEq/L (ref 96–112)
Creatinine, Ser: 0.68 mg/dL (ref 0.50–1.10)
GFR calc Af Amer: >90 mL/min (ref >90)
GFR calc non Af Amer: >90 mL/min (ref >90)

## 2011-06-13 LAB — SURGICAL PCR SCREEN: MRSA, PCR: NEGATIVE

## 2011-06-13 NOTE — Patient Instructions (Addendum)
20 ABIR EROH  06/13/2011   Your procedure is scheduled on:  06/17/2011  Report to [facility] at  615  AM.  Call this number if you have problems the morning of surgery: [phone]   Remember:   Do not eat food:After Midnight.  May have clear liquids:until Midnight .  Clear liquids include soda, tea, black coffee, apple or grape juice, broth.  Take these medicines the morning of surgery with A SIP OF WATER: xanax,protonix   Do not wear jewelry, make-up or nail polish.  Do not wear lotions, powders, or perfumes. You may wear deodorant.  Do not shave 48 hours prior to surgery.  Do not bring valuables to the hospital.  Contacts, dentures or bridgework may not be worn into surgery.  Leave suitcase in the car. After surgery it may be brought to your room.  For patients admitted to the hospital, checkout time is 11:00 AM the day of discharge.   Patients discharged the day of surgery will not be allowed to drive home.  Name and phone number of your driver: family  Special Instructions: N/A   Please read over the following fact sheets that you were given: Pain Booklet, MRSA Information, Surgical Site Infection Prevention, Anesthesia Post-op Instructions and Care and Recovery After Surgery Total Knee Replacement In total knee replacement, the damaged knee is replaced with an artificial knee joint (prosthesis). The purpose of this surgery is to reduce pain and improve your range of motion. Regaining a near normal range of motion of your knee in the first 3 to 6 weeks after surgery is critical. Generally, these artificial joints last a minimum of 10 years. By that time, about 1 in 10 patients will need another surgery to repair the loose prosthesis. LET YOUR CAREGIVER KNOW ABOUT:   Allergies to food or medicine.   Medicines taken, including vitamins, herbs, eyedrops, over-the-counter medicines, and creams.   Use of steroids (by mouth or creams).   Previous problems with anesthetics or  numbing medicines.   History of bleeding problems or blood clots.   Previous surgery.   Other health problems, including diabetes and kidney problems.   Possibility of pregnancy, if this applies.  RISKS AND COMPLICATIONS   Knee pain.   Loss of range of motion of the knee (stiffness) or instability.   Loosening of the prosthesis.   Infection.  BEFORE THE PROCEDURE   If you smoke, quit.   You may need a replacement or addition of blood (transfusion) during this procedure. You may want to donate your own blood for storage several weeks before the procedure. This way, your own blood can be stored and given to you if needed. Talk to your surgeon about this option.   Do not eat or drink anything for as long as directed by your caregiver before the procedure.   You may bathe and brush your teeth before the procedure. Do not swallow the water when brushing your teeth.   Scrub the area to be operated on for 5 minutes in the morning before the procedure. Use special soap if you are directed to do so by your caregiver.   Take your regular medicines with a small sip of water unless otherwise instructed. Your caregiver will let you know if there are medicines that need to be stopped and for how long.   You should be present 60 minutes before your procedure or as directed by your caregiver.  PROCEDURE  Before surgery, an intravenous (IV) access for giving  fluids will be started. You will be given medicines and/or gas to make you sleep (anesthetic). You may be given medicines in your back with a needle to make you numb from the waist down. Your surgeon will take out any damaged cartilage and bone. He or she will then put in new metal, plastic, or ceramic joint surfaces to restore alignment and function to your knee. AFTER THE PROCEDURE  You will be taken to the recovery area where a nurse will watch and check your progress. You may have a long, narrow tube (catheter) in your bladder after  surgery. The catheter helps you empty your bladder (pass your urine). You may have drainage tubes coming out from under the dressing. These tubes attach to a device that removes blood or fluids that gather after surgery. Once you are awake, stable, and taking fluids well, you will be returned to your room. You will receive physical therapy as prescribed by your caregiver. If you do not have help at home, you may need to go to an extended care facility for a few weeks. If you are sent home with a continuous passive motion (CPM) machine, use it as instructed. Document Released: 08/19/2000 Document Revised: 01/23/2011 Document Reviewed: 03/15/2009 [facility] Patient Information 2012 [place], [place].PATIENT INSTRUCTIONS POST-ANESTHESIA  IMMEDIATELY FOLLOWING SURGERY:  Do not drive or operate machinery for the first twenty four hours after surgery.  Do not make any important decisions for twenty four hours after surgery or while taking narcotic pain medications or sedatives.  If you develop intractable nausea and vomiting or a severe headache please notify your doctor immediately.  FOLLOW-UP:  Please make an appointment with your surgeon as instructed. You do not need to follow up with anesthesia unless specifically instructed to do so.  WOUND CARE INSTRUCTIONS (if applicable):  Keep a dry clean dressing on the anesthesia/puncture wound site if there is drainage.  Once the wound has quit draining you may leave it open to air.  Generally you should leave the bandage intact for twenty four hours unless there is drainage.  If the epidural site drains for more than 36-48 hours please call the anesthesia department.  QUESTIONS?:  Please feel free to call your physician or the hospital operator if you have any questions, and they will be happy to assist you.     [facility] Anesthesia Department [address] [place] [phone]

## 2011-06-13 NOTE — Pre-Procedure Instructions (Signed)
Pt had viewed video #905 before and does not want to view again.

## 2011-06-17 ENCOUNTER — Encounter (HOSPITAL_COMMUNITY): Payer: Self-pay | Admitting: Anesthesiology

## 2011-06-17 ENCOUNTER — Inpatient Hospital Stay (HOSPITAL_COMMUNITY)
Admission: RE | Admit: 2011-06-17 | Discharge: 2011-06-20 | DRG: 470 | Disposition: A | Discharge status: Home Health | Payer: Medicare Other | Source: Ambulatory Visit | Attending: Orthopedic Surgery | Admitting: Orthopedic Surgery

## 2011-06-17 ENCOUNTER — Inpatient Hospital Stay (HOSPITAL_COMMUNITY): Payer: Medicare Other | Admitting: Anesthesiology

## 2011-06-17 ENCOUNTER — Encounter (HOSPITAL_COMMUNITY): Payer: Self-pay | Admitting: Unknown Physician Specialty

## 2011-06-17 ENCOUNTER — Encounter (HOSPITAL_COMMUNITY): Payer: Self-pay | Admitting: *Deleted

## 2011-06-17 ENCOUNTER — Inpatient Hospital Stay (HOSPITAL_COMMUNITY): Payer: Medicare Other

## 2011-06-17 ENCOUNTER — Encounter (HOSPITAL_COMMUNITY)
Admission: RE | Disposition: A | Discharge status: Home Health | Payer: Self-pay | Source: Ambulatory Visit | Attending: Orthopedic Surgery

## 2011-06-17 DIAGNOSIS — Z9181 History of falling: Secondary | ICD-10-CM

## 2011-06-17 DIAGNOSIS — G43909 Migraine, unspecified, not intractable, without status migrainosus: Secondary | ICD-10-CM | POA: Diagnosis present

## 2011-06-17 DIAGNOSIS — K219 Gastro-esophageal reflux disease without esophagitis: Secondary | ICD-10-CM | POA: Diagnosis present

## 2011-06-17 DIAGNOSIS — Z87891 Personal history of nicotine dependence: Secondary | ICD-10-CM

## 2011-06-17 DIAGNOSIS — IMO0002 Reserved for concepts with insufficient information to code with codable children: Secondary | ICD-10-CM

## 2011-06-17 DIAGNOSIS — Z79899 Other long term (current) drug therapy: Secondary | ICD-10-CM

## 2011-06-17 DIAGNOSIS — M1710 Unilateral primary osteoarthritis, unspecified knee: Principal | ICD-10-CM | POA: Diagnosis present

## 2011-06-17 DIAGNOSIS — Z96659 Presence of unspecified artificial knee joint: Secondary | ICD-10-CM

## 2011-06-17 DIAGNOSIS — Z85820 Personal history of malignant melanoma of skin: Secondary | ICD-10-CM

## 2011-06-17 HISTORY — PX: TOTAL KNEE ARTHROPLASTY: SHX125

## 2011-06-17 SURGERY — ARTHROPLASTY, KNEE, TOTAL
Anesthesia: Spinal | Laterality: Right | Wound class: Clean

## 2011-06-17 MED ORDER — KETOROLAC TROMETHAMINE 30 MG/ML IJ SOLN
INTRAMUSCULAR | Status: AC
  Filled 2011-06-17: qty 1

## 2011-06-17 MED ORDER — FENTANYL CITRATE 0.05 MG/ML IJ SOLN
INTRAMUSCULAR | Status: AC
  Filled 2011-06-17: qty 2

## 2011-06-17 MED ORDER — BUPIVACAINE HCL (PF) 0.25 % IJ SOLN
INTRAMUSCULAR | Status: AC
Start: 2011-06-17 — End: 2011-06-17
  Filled 2011-06-17: qty 270

## 2011-06-17 MED ORDER — ACETAMINOPHEN 10 MG/ML IV SOLN
INTRAVENOUS | Status: AC
  Filled 2011-06-17: qty 300

## 2011-06-17 MED ORDER — EPHEDRINE SULFATE 50 MG/ML IJ SOLN
INTRAMUSCULAR | Status: DC | PRN
  Administered 2011-06-17 (×3): 10 mg via INTRAVENOUS

## 2011-06-17 MED ORDER — BUPIVACAINE IN DEXTROSE 0.75-8.25 % IT SOLN
INTRATHECAL | Status: AC
  Filled 2011-06-17: qty 2

## 2011-06-17 MED ORDER — MAGNESIUM CITRATE PO SOLN: 1.0000 | Freq: Once | ORAL | Status: AC | PRN

## 2011-06-17 MED ORDER — PHENOL 1.4 % MT LIQD: 1.0000 | OROMUCOSAL | Status: DC | PRN

## 2011-06-17 MED ORDER — CELECOXIB 100 MG PO CAPS
400.0000 mg | ORAL_CAPSULE | Freq: Once | ORAL | Status: AC
  Administered 2011-06-17: 400 mg via ORAL

## 2011-06-17 MED ORDER — ACETAMINOPHEN 10 MG/ML IV SOLN
1000.0000 mg | Freq: Four times a day (QID) | INTRAVENOUS | Status: AC
  Administered 2011-06-17 – 2011-06-18 (×3): 1000 mg via INTRAVENOUS
  Filled 2011-06-17 (×4): qty 100

## 2011-06-17 MED ORDER — PROPOFOL 10 MG/ML IV EMUL
INTRAVENOUS | Status: AC
  Filled 2011-06-17: qty 20

## 2011-06-17 MED ORDER — ALUM & MAG HYDROXIDE-SIMETH 200-200-20 MG/5ML PO SUSP: 30.0000 mL | ORAL | Status: DC | PRN

## 2011-06-17 MED ORDER — ONDANSETRON HCL 4 MG/2ML IJ SOLN
4.0000 mg | Freq: Once | INTRAMUSCULAR | Status: AC
  Administered 2011-06-17: 4 mg via INTRAVENOUS

## 2011-06-17 MED ORDER — CEFAZOLIN SODIUM-DEXTROSE 2-3 GM-% IV SOLR: 2.0000 g | INTRAVENOUS | Status: DC

## 2011-06-17 MED ORDER — ACETAMINOPHEN 500 MG PO TABS
500.0000 mg | ORAL_TABLET | Freq: Once | ORAL | Status: AC
  Administered 2011-06-17: 500 mg via ORAL

## 2011-06-17 MED ORDER — POTASSIUM CHLORIDE CRYS ER 20 MEQ PO TBCR
20.0000 meq | EXTENDED_RELEASE_TABLET | ORAL | Status: DC
  Administered 2011-06-17 – 2011-06-20 (×4): 20 meq via ORAL
  Filled 2011-06-17 (×4): qty 1

## 2011-06-17 MED ORDER — LIDOCAINE HCL (PF) 1 % IJ SOLN
INTRAMUSCULAR | Status: AC
  Filled 2011-06-17: qty 5

## 2011-06-17 MED ORDER — SODIUM CHLORIDE 0.9 % IR SOLN
Status: DC | PRN
  Administered 2011-06-17: 3000 mL

## 2011-06-17 MED ORDER — ONDANSETRON HCL 4 MG/2ML IJ SOLN: 4.0000 mg | Freq: Four times a day (QID) | INTRAMUSCULAR | Status: DC | PRN

## 2011-06-17 MED ORDER — CEFAZOLIN SODIUM-DEXTROSE 2-3 GM-% IV SOLR
2.0000 g | Freq: Four times a day (QID) | INTRAVENOUS | Status: AC
  Administered 2011-06-17 (×3): 2 g via INTRAVENOUS
  Filled 2011-06-17 (×3): qty 50

## 2011-06-17 MED ORDER — METHOCARBAMOL 100 MG/ML IJ SOLN
500.0000 mg | Freq: Once | INTRAVENOUS | Status: AC
  Administered 2011-06-17: 500 mg via INTRAVENOUS
  Filled 2011-06-17: qty 5

## 2011-06-17 MED ORDER — CELECOXIB 100 MG PO CAPS
ORAL_CAPSULE | ORAL | Status: AC
  Administered 2011-06-17: 400 mg via ORAL
  Filled 2011-06-17: qty 4

## 2011-06-17 MED ORDER — ONDANSETRON HCL 4 MG/2ML IJ SOLN
INTRAMUSCULAR | Status: AC
  Filled 2011-06-17: qty 2

## 2011-06-17 MED ORDER — ADULT MULTIVITAMIN W/MINERALS CH
1.0000 | ORAL_TABLET | Freq: Every day | ORAL | Status: DC
  Administered 2011-06-17 – 2011-06-19 (×3): 1 via ORAL
  Filled 2011-06-17 (×3): qty 1

## 2011-06-17 MED ORDER — ASPIRIN EC 325 MG PO TBEC
325.0000 mg | DELAYED_RELEASE_TABLET | Freq: Two times a day (BID) | ORAL | Status: DC
  Administered 2011-06-18 – 2011-06-20 (×5): 325 mg via ORAL
  Filled 2011-06-17 (×5): qty 1

## 2011-06-17 MED ORDER — POTASSIUM CHLORIDE IN NACL 20-0.9 MEQ/L-% IV SOLN
INTRAVENOUS | Status: DC
  Administered 2011-06-17 (×2): via INTRAVENOUS

## 2011-06-17 MED ORDER — PROPOFOL 10 MG/ML IV EMUL
INTRAVENOUS | Status: DC | PRN
  Administered 2011-06-17 (×2): 50 ug/kg/min via INTRAVENOUS

## 2011-06-17 MED ORDER — DIPHENHYDRAMINE HCL 12.5 MG/5ML PO ELIX: 12.5000 mg | ORAL_SOLUTION | ORAL | Status: DC | PRN

## 2011-06-17 MED ORDER — CALCIUM CARBONATE-VITAMIN D 500-200 MG-UNIT PO TABS
1.0000 | ORAL_TABLET | Freq: Two times a day (BID) | ORAL | Status: DC
  Administered 2011-06-17 – 2011-06-20 (×7): 1 via ORAL
  Filled 2011-06-17 (×7): qty 1

## 2011-06-17 MED ORDER — EPHEDRINE SULFATE 50 MG/ML IJ SOLN
INTRAMUSCULAR | Status: AC
  Filled 2011-06-17: qty 1

## 2011-06-17 MED ORDER — METOCLOPRAMIDE HCL 5 MG/ML IJ SOLN: 5.0000 mg | Freq: Three times a day (TID) | INTRAMUSCULAR | Status: DC | PRN

## 2011-06-17 MED ORDER — BUPIVACAINE 0.25 % ON-Q PUMP SINGLE CATH 300ML
INJECTION | Status: DC | PRN
  Administered 2011-06-17: 270 mL

## 2011-06-17 MED ORDER — BISACODYL 5 MG PO TBEC: 5.0000 mg | DELAYED_RELEASE_TABLET | Freq: Every day | ORAL | Status: DC | PRN

## 2011-06-17 MED ORDER — METHOCARBAMOL 100 MG/ML IJ SOLN: 500.0000 mg | Freq: Four times a day (QID) | INTRAVENOUS | Status: DC | PRN

## 2011-06-17 MED ORDER — BUPIVACAINE ON-Q PAIN PUMP (FOR ORDER SET NO CHG)
INJECTION | Status: DC
  Filled 2011-06-17: qty 1

## 2011-06-17 MED ORDER — METOCLOPRAMIDE HCL 10 MG PO TABS: 5.0000 mg | ORAL_TABLET | Freq: Three times a day (TID) | ORAL | Status: DC | PRN

## 2011-06-17 MED ORDER — ONDANSETRON HCL 4 MG PO TABS: 4.0000 mg | ORAL_TABLET | Freq: Four times a day (QID) | ORAL | Status: DC | PRN

## 2011-06-17 MED ORDER — BUPIVACAINE HCL 0.75 % IJ SOLN
INTRAMUSCULAR | Status: DC | PRN
  Administered 2011-06-17: 15 mg via INTRATHECAL

## 2011-06-17 MED ORDER — HYDROCODONE-ACETAMINOPHEN 5-325 MG PO TABS
1.0000 | ORAL_TABLET | Freq: Once | ORAL | Status: AC
  Administered 2011-06-17: 1 via ORAL

## 2011-06-17 MED ORDER — MENTHOL 3 MG MT LOZG: 1.0000 | LOZENGE | OROMUCOSAL | Status: DC | PRN

## 2011-06-17 MED ORDER — HYDROCODONE-ACETAMINOPHEN 5-325 MG PO TABS
ORAL_TABLET | ORAL | Status: AC
  Administered 2011-06-17: 1 via ORAL
  Filled 2011-06-17: qty 1

## 2011-06-17 MED ORDER — BUPIVACAINE-EPINEPHRINE PF 0.5-1:200000 % IJ SOLN
INTRAMUSCULAR | Status: AC
  Filled 2011-06-17: qty 20

## 2011-06-17 MED ORDER — CEFAZOLIN SODIUM 1-5 GM-% IV SOLN
INTRAVENOUS | Status: DC | PRN
  Administered 2011-06-17: 2 g via INTRAVENOUS

## 2011-06-17 MED ORDER — FENTANYL CITRATE 0.05 MG/ML IJ SOLN: 25.0000 ug | INTRAMUSCULAR | Status: DC | PRN

## 2011-06-17 MED ORDER — ACETAMINOPHEN 325 MG PO TABS: 650.0000 mg | ORAL_TABLET | Freq: Four times a day (QID) | ORAL | Status: DC | PRN

## 2011-06-17 MED ORDER — METHOCARBAMOL 500 MG PO TABS
500.0000 mg | ORAL_TABLET | Freq: Four times a day (QID) | ORAL | Status: DC | PRN
  Administered 2011-06-17: 500 mg via ORAL
  Filled 2011-06-17: qty 1

## 2011-06-17 MED ORDER — CELECOXIB 100 MG PO CAPS
ORAL_CAPSULE | ORAL | Status: AC
  Filled 2011-06-17: qty 4

## 2011-06-17 MED ORDER — HYDROMORPHONE HCL PF 1 MG/ML IJ SOLN
0.5000 mg | INTRAMUSCULAR | Status: DC | PRN
  Administered 2011-06-17 – 2011-06-19 (×12): 0.5 mg via INTRAVENOUS
  Filled 2011-06-17 (×14): qty 1

## 2011-06-17 MED ORDER — SENNA 8.6 MG PO TABS
1.0000 | ORAL_TABLET | Freq: Two times a day (BID) | ORAL | Status: DC
  Administered 2011-06-17 – 2011-06-20 (×7): 8.6 mg via ORAL
  Filled 2011-06-17 (×5): qty 1

## 2011-06-17 MED ORDER — MIDAZOLAM HCL 2 MG/2ML IJ SOLN
1.0000 mg | INTRAMUSCULAR | Status: DC | PRN
  Administered 2011-06-17: 2 mg via INTRAVENOUS

## 2011-06-17 MED ORDER — ACETAMINOPHEN 650 MG RE SUPP: 650.0000 mg | Freq: Four times a day (QID) | RECTAL | Status: DC | PRN

## 2011-06-17 MED ORDER — MIDAZOLAM HCL 2 MG/2ML IJ SOLN
INTRAMUSCULAR | Status: AC
  Filled 2011-06-17: qty 2

## 2011-06-17 MED ORDER — MIDAZOLAM HCL 5 MG/5ML IJ SOLN
INTRAMUSCULAR | Status: DC | PRN
  Administered 2011-06-17 (×2): 1 mg via INTRAVENOUS

## 2011-06-17 MED ORDER — DOCUSATE SODIUM 100 MG PO CAPS
100.0000 mg | ORAL_CAPSULE | Freq: Two times a day (BID) | ORAL | Status: DC
  Administered 2011-06-17 – 2011-06-20 (×7): 100 mg via ORAL
  Filled 2011-06-17 (×7): qty 1

## 2011-06-17 MED ORDER — LIDOCAINE HCL (CARDIAC) 10 MG/ML IV SOLN
INTRAVENOUS | Status: DC | PRN
  Administered 2011-06-17: 25 mg via INTRAVENOUS

## 2011-06-17 MED ORDER — CHLORHEXIDINE GLUCONATE 4 % EX LIQD
60.0000 mL | Freq: Once | CUTANEOUS | Status: DC
  Filled 2011-06-17: qty 60

## 2011-06-17 MED ORDER — DIPHENHYDRAMINE HCL 50 MG/ML IJ SOLN
25.0000 mg | Freq: Once | INTRAMUSCULAR | Status: AC
  Administered 2011-06-17: 25 mg via INTRAVENOUS

## 2011-06-17 MED ORDER — HYDROCODONE-ACETAMINOPHEN 5-325 MG PO TABS
1.0000 | ORAL_TABLET | ORAL | Status: DC
  Administered 2011-06-17 (×4): 1 via ORAL
  Filled 2011-06-17 (×5): qty 1

## 2011-06-17 MED ORDER — ACETAMINOPHEN 500 MG PO TABS
ORAL_TABLET | ORAL | Status: AC
  Administered 2011-06-17: 500 mg via ORAL
  Filled 2011-06-17: qty 1

## 2011-06-17 MED ORDER — FUROSEMIDE 20 MG PO TABS
20.0000 mg | ORAL_TABLET | ORAL | Status: DC
  Administered 2011-06-17 – 2011-06-20 (×4): 20 mg via ORAL
  Filled 2011-06-17 (×4): qty 1

## 2011-06-17 MED ORDER — CEFAZOLIN SODIUM-DEXTROSE 2-3 GM-% IV SOLR
INTRAVENOUS | Status: AC
  Filled 2011-06-17: qty 50

## 2011-06-17 MED ORDER — DIPHENHYDRAMINE HCL 50 MG/ML IJ SOLN
INTRAMUSCULAR | Status: AC
  Administered 2011-06-17: 25 mg via INTRAVENOUS
  Filled 2011-06-17: qty 1

## 2011-06-17 MED ORDER — CELECOXIB 100 MG PO CAPS
ORAL_CAPSULE | ORAL | Status: AC
  Filled 2011-06-17: qty 1

## 2011-06-17 MED ORDER — SODIUM CHLORIDE 0.9 % IR SOLN
Status: DC | PRN
  Administered 2011-06-17: 1000 mL

## 2011-06-17 MED ORDER — ONDANSETRON HCL 4 MG/2ML IJ SOLN: 4.0000 mg | Freq: Once | INTRAMUSCULAR | Status: DC | PRN

## 2011-06-17 MED ORDER — OXYCODONE-ACETAMINOPHEN 5-325 MG PO TABS
ORAL_TABLET | ORAL | Status: AC
  Filled 2011-06-17: qty 1

## 2011-06-17 MED ORDER — SODIUM CHLORIDE 0.9 % IJ SOLN
INTRAMUSCULAR | Status: AC
  Administered 2011-06-17: 19:00:00
  Filled 2011-06-17: qty 3

## 2011-06-17 MED ORDER — BUPIVACAINE-EPINEPHRINE 0.5% -1:200000 IJ SOLN
INTRAMUSCULAR | Status: DC | PRN
  Administered 2011-06-17: 60 mL

## 2011-06-17 MED ORDER — SENNOSIDES-DOCUSATE SODIUM 8.6-50 MG PO TABS
1.0000 | ORAL_TABLET | Freq: Every evening | ORAL | Status: DC | PRN
  Administered 2011-06-19 – 2011-06-20 (×3): 1 via ORAL
  Filled 2011-06-17 (×2): qty 1

## 2011-06-17 MED ORDER — LACTATED RINGERS IV SOLN
INTRAVENOUS | Status: DC
  Administered 2011-06-17: 1000 mL via INTRAVENOUS

## 2011-06-17 MED ORDER — FENTANYL CITRATE 0.05 MG/ML IJ SOLN
INTRAMUSCULAR | Status: DC | PRN
  Administered 2011-06-17: 50 ug via INTRAVENOUS
  Administered 2011-06-17: 30 ug via INTRAVENOUS
  Administered 2011-06-17: 25 ug via INTRAVENOUS
  Administered 2011-06-17: 50 ug via INTRAVENOUS
  Administered 2011-06-17: 25 ug via INTRAVENOUS

## 2011-06-17 MED ORDER — MIDAZOLAM HCL 2 MG/2ML IJ SOLN
INTRAMUSCULAR | Status: AC
  Administered 2011-06-17: 2 mg via INTRAVENOUS
  Filled 2011-06-17: qty 2

## 2011-06-17 MED ORDER — PANTOPRAZOLE SODIUM 40 MG PO TBEC
40.0000 mg | DELAYED_RELEASE_TABLET | Freq: Every day | ORAL | Status: DC
  Administered 2011-06-17 – 2011-06-19 (×3): 40 mg via ORAL
  Filled 2011-06-17 (×3): qty 1

## 2011-06-17 MED ORDER — FENTANYL CITRATE 0.05 MG/ML IJ SOLN
INTRAMUSCULAR | Status: DC | PRN
  Administered 2011-06-17: 20 ug via INTRATHECAL

## 2011-06-17 MED ORDER — LACTATED RINGERS IV SOLN
INTRAVENOUS | Status: DC | PRN
  Administered 2011-06-17 (×2): via INTRAVENOUS

## 2011-06-17 MED ORDER — ALBUTEROL SULFATE HFA 108 (90 BASE) MCG/ACT IN AERS: 2.0000 | INHALATION_SPRAY | Freq: Four times a day (QID) | RESPIRATORY_TRACT | Status: DC | PRN

## 2011-06-17 MED ORDER — ACETAMINOPHEN 10 MG/ML IV SOLN
INTRAVENOUS | Status: AC
  Filled 2011-06-17: qty 100

## 2011-06-17 MED ORDER — ALPRAZOLAM 0.25 MG PO TABS: 0.2500 mg | ORAL_TABLET | Freq: Every evening | ORAL | Status: DC | PRN

## 2011-06-17 SURGICAL SUPPLY — 77 items
BAG HAMPER (MISCELLANEOUS) ×2 IMPLANT
BANDAGE ELASTIC 4 VELCRO NS (GAUZE/BANDAGES/DRESSINGS) ×2 IMPLANT
BANDAGE ELASTIC 6 VELCRO NS (GAUZE/BANDAGES/DRESSINGS) ×4 IMPLANT
BANDAGE ESMARK 6X9 LF (GAUZE/BANDAGES/DRESSINGS) ×1 IMPLANT
BIT DRILL 3.2X128 (BIT) IMPLANT
BLADE HEX COATED 2.75 (ELECTRODE) ×2 IMPLANT
BLADE SAG 18X100X1.27 (BLADE) ×2 IMPLANT
BLADE SAGITTAL 25.0X1.27X90 (BLADE) ×2 IMPLANT
BLADE SAW SAG 90X13X1.27 (BLADE) ×2 IMPLANT
BLADE SURG SZ10 CARB STEEL (BLADE) ×2 IMPLANT
BNDG ESMARK 6X9 LF (GAUZE/BANDAGES/DRESSINGS) ×2
BOWL SMART MIX CTS (DISPOSABLE) IMPLANT
CATH KIT ON Q 2.5IN SLV (PAIN MANAGEMENT) ×2 IMPLANT
CEMENT HV SMART SET (Cement) ×4 IMPLANT
CLOTH BEACON ORANGE TIMEOUT ST (SAFETY) ×2 IMPLANT
COOLER CRYO CUFF IC AND MOTOR (MISCELLANEOUS) ×2 IMPLANT
COVER LIGHT HANDLE STERIS (MISCELLANEOUS) ×4 IMPLANT
COVER PROBE W GEL 5X96 (DRAPES) ×4 IMPLANT
CUFF CRYO KNEE LG 20X31 COOLER (ORTHOPEDIC SUPPLIES) ×2 IMPLANT
CUFF CRYO KNEE18X23 MED (MISCELLANEOUS) IMPLANT
CUFF TOURNIQUET SINGLE 34IN LL (TOURNIQUET CUFF) IMPLANT
CUFF TOURNIQUET SINGLE 44IN (TOURNIQUET CUFF) ×2 IMPLANT
DECANTER SPIKE VIAL GLASS SM (MISCELLANEOUS) ×22 IMPLANT
DRAIN TROCAR  MED 1/8 (DRAIN) ×2 IMPLANT
DRAPE BACK TABLE (DRAPES) ×2 IMPLANT
DRAPE EXTREMITY T 121X128X90 (DRAPE) ×2 IMPLANT
DRAPE U-SHAPE 47X51 STRL (DRAPES) ×2 IMPLANT
DRESSING ALLEVYN BORDER HEEL (GAUZE/BANDAGES/DRESSINGS) IMPLANT
DRSG MEPILEX BORDER 4X12 (GAUZE/BANDAGES/DRESSINGS) ×2 IMPLANT
DURAPREP 26ML APPLICATOR (WOUND CARE) ×2 IMPLANT
ELECT REM PT RETURN 9FT ADLT (ELECTROSURGICAL) ×2
ELECTRODE REM PT RTRN 9FT ADLT (ELECTROSURGICAL) ×1 IMPLANT
FACESHIELD LNG OPTICON STERILE (SAFETY) ×2 IMPLANT
GLOVE BIOGEL PI IND STRL 7.0 (GLOVE) ×2 IMPLANT
GLOVE BIOGEL PI IND STRL 8 (GLOVE) ×2 IMPLANT
GLOVE BIOGEL PI INDICATOR 7.0 (GLOVE) ×2
GLOVE BIOGEL PI INDICATOR 8 (GLOVE) ×2
GLOVE OPTIFIT SS 8.0 STRL (GLOVE) ×2 IMPLANT
GLOVE SKINSENSE NS SZ6.5 (GLOVE) ×2
GLOVE SKINSENSE NS SZ7.0 (GLOVE) ×1
GLOVE SKINSENSE NS SZ7.5 (GLOVE) ×1
GLOVE SKINSENSE NS SZ8.0 LF (GLOVE) ×3
GLOVE SKINSENSE STRL SZ6.5 (GLOVE) ×2 IMPLANT
GLOVE SKINSENSE STRL SZ7.0 (GLOVE) ×1 IMPLANT
GLOVE SKINSENSE STRL SZ7.5 (GLOVE) ×1 IMPLANT
GLOVE SKINSENSE STRL SZ8.0 LF (GLOVE) ×3 IMPLANT
GLOVE SS N UNI LF 8.5 STRL (GLOVE) ×2 IMPLANT
GOWN STRL REIN XL XLG (GOWN DISPOSABLE) ×8 IMPLANT
HANDPIECE INTERPULSE COAX TIP (DISPOSABLE) ×1
HOOD W/PEELAWAY (MISCELLANEOUS) ×8 IMPLANT
INST SET MAJOR BONE (KITS) ×2 IMPLANT
IV NS IRRIG 3000ML ARTHROMATIC (IV SOLUTION) ×2 IMPLANT
KIT BLADEGUARD II DBL (SET/KITS/TRAYS/PACK) ×2 IMPLANT
KIT ROOM TURNOVER APOR (KITS) ×2 IMPLANT
MANIFOLD NEPTUNE II (INSTRUMENTS) ×2 IMPLANT
MARKER SKIN DUAL TIP RULER LAB (MISCELLANEOUS) ×2 IMPLANT
NEEDLE HYPO 21X1.5 SAFETY (NEEDLE) ×2 IMPLANT
NS IRRIG 1000ML POUR BTL (IV SOLUTION) ×2 IMPLANT
PACK TOTAL JOINT (CUSTOM PROCEDURE TRAY) ×2 IMPLANT
PAD ARMBOARD 7.5X6 YLW CONV (MISCELLANEOUS) ×2 IMPLANT
PAD DANNIFLEX CPM (ORTHOPEDIC SUPPLIES) ×2 IMPLANT
PIN TROCAR 3 INCH (PIN) ×2 IMPLANT
SET BASIN LINEN APH (SET/KITS/TRAYS/PACK) ×2 IMPLANT
SET HNDPC FAN SPRY TIP SCT (DISPOSABLE) ×1 IMPLANT
SPONGE GAUZE 4X4 12PLY (GAUZE/BANDAGES/DRESSINGS) IMPLANT
STAPLER VISISTAT 35W (STAPLE) ×2 IMPLANT
SUT BRALON NAB BRD #1 30IN (SUTURE) ×4 IMPLANT
SUT MON AB 0 CT1 (SUTURE) ×4 IMPLANT
SUT MON AB 2-0 CT1 36 (SUTURE) IMPLANT
SYR 30ML LL (SYRINGE) ×2 IMPLANT
SYR BULB IRRIGATION 50ML (SYRINGE) ×2 IMPLANT
TOWEL OR 17X26 4PK STRL BLUE (TOWEL DISPOSABLE) ×2 IMPLANT
TOWER CARTRIDGE SMART MIX (DISPOSABLE) ×2 IMPLANT
TRAY FOLEY BAG SILVER LF 14FR (CATHETERS) ×2 IMPLANT
TRAY FOLEY CATH 14FR (SET/KITS/TRAYS/PACK) IMPLANT
WATER STERILE IRR 1000ML POUR (IV SOLUTION) ×8 IMPLANT
YANKAUER SUCT 12FT TUBE ARGYLE (SUCTIONS) ×2 IMPLANT

## 2011-06-17 NOTE — Preoperative (Signed)
Beta Blockers   Reason not to administer Beta Blockers:Not Applicable 

## 2011-06-17 NOTE — Anesthesia Preprocedure Evaluation (Addendum)
Anesthesia Evaluation  Patient identified by MRN, date of birth, ID band Patient awake    History of Anesthesia Complications Negative for: history of anesthetic complications  Airway Mallampati: III      Dental  (+) Teeth Intact   Pulmonary pneumonia  (resolved),  clear to auscultation        Cardiovascular neg cardio ROS Regular Normal    Neuro/Psych  Headaches,    GI/Hepatic GERD-  Medicated and Controlled,  Endo/Other    Renal/GU      Musculoskeletal   Abdominal   Peds  Hematology   Anesthesia Other Findings   Reproductive/Obstetrics                           Anesthesia Physical Anesthesia Plan  ASA: II  Anesthesia Plan: Spinal   Post-op Pain Management:    Induction:   Airway Management Planned: Nasal Cannula  Additional Equipment:   Intra-op Plan:   Post-operative Plan:   Informed Consent: I have reviewed the patients History and Physical, chart, labs and discussed the procedure including the risks, benefits and alternatives for the proposed anesthesia with the patient or authorized representative who has indicated his/her understanding and acceptance.     Plan Discussed with:   Anesthesia Plan Comments:         Anesthesia Quick Evaluation

## 2011-06-17 NOTE — Progress Notes (Signed)
Paged MD several times during shift in regards to patient needing something stronger for pain. Continuing to wait to MD to return phone call.

## 2011-06-17 NOTE — Progress Notes (Signed)
Utilization review completed.  

## 2011-06-17 NOTE — Op Note (Signed)
Preop diagnosis osteoarthritis right  knee Postop diagnosis same Procedure right total knee arthroplasty Surgeon Romeo Apple Assisted by wayne mcfatter and betty ashley  Anesthesia spinal Findings SEVERE OA AND LARGE PANNUS FORMATION  Tourniquet time 86 minutes, pressure 300 mm of mercury DEPUY 2 F    2T   32P   10 PS FB INSERT  Indications for procedure disabling knee pain, failure to control pain with nonoperative measures  Details of procedure:  In the preop area the patient's RIGHT knee was marked and countersigned by the surgeon, the chart was updated, consent was signed  The patient was taken to the operating room for spinal anesthetic followed by administering 2 g of Ancef based on weight of >80 kg  A Foley catheter was inserted sterilely, then the operative extremity  was prepped and draped sterilely  The timeout was completed  The limb was then exsanguinated with a six-inch Esmarch with the knee in flexion and the tourniquet was elevated to 300 mm of mercury. A midline incision was made, the subcutaneous tissues were divided down to the extensor mechanism. A medial arthrotomy was performed, the patella was everted,  the fat pad was resected. The medial and  lateral menisci were resected. The medial soft tissue sleeve was elevated to the mid coronal plane. The anterior cruciate ligament and PCL were resected. Osteophytes were removed. The distal femur anterior surface was skeletonized with sharp dissection.  A three-eighths inch drill bit was used to enter the femoral canal which was suctioned and irrigated until the fluid was clear;  an intramedullary rod was placed in the femur with a 5 RIGHT setting, the block was pinned in place; then an 11 mm distal femoral resection was performed. The cut was checked for flatness.  The sizing guide was then placed on the femur, the femur measured a size 2; the block was pinned in external rotation 3 using the epicondyles as reference; a  4-in-1  cutting block was placed and the 4 cuts were made with retractors protecting the collateral ligaments. The posterior osteophytes were removed with a curved osteotome. Residual PCL tissue was resected; residual meniscal tissue was resected.  The external tibial alignment instrument was set for tibial resection. The guide was   placed referencing the medial side which was the worn side and the stylus was set at 2 mm resection. Anterior slope was built in to match the patients anatomy; a neutral varus valgus cut was set using the medial third of the tibial tubercle as reference along with the medial portion of the lateral tibial spine. Theguide was stabilized with pins.  A saw was used to resect the anterior tibia. The tibia was sized to a size 2 base plate.  We then placed spacer blocks using a  10 spacer block;  the knee was balanced  in extension and was balanced in flexion with the 10 spacer block   The box cut was then done using the box cutting guide. We then turned our attention to the patella.  The patella measured 21 mm we set the guide to leave 14 mm of patella.  After resection of the patella,  It was remeasured, and measured 12.5 mm. The size was 32. We then drilled the 3 peg holes.  We then did a trial reduction. The trial reduction was excellent with full extension, balanced in extension, balance in flexion. Passive flexion equalled 110, patella normal tracking.   We then punched the tibia per technique.   The bone was  then irrigated and dried while cement was mixed on the back table. The implants were checked for accuracy and then cemented in place; excess cement was removed; the cement was allowed to cure. The wound was then irrigated with copious amounts of saline, the posterior capsule was injected with 30 cc of Marcaine with epinephrine followed by 30 cc in the soft tissue. The 10PS  insert was placed. Range of motion matched trial reduction  The capsule was closed with #1 Bralon in   running fashion and then the joint was injected with 30 cc of Marcaine with epinephrine.  The subcutaneous pain pump was placed.  The subcutaneous tissues were closed with   0 Monocryl and 2-0 Monocryl in running fashion  Staples were used to reapproximate the skin edges.  Sterile dressings were applied. A radiograph was obtained. Cryo/Cuff was placed and activated.  The patient was then taken to the recovery room in stable condition.  Routine postop plan for knee replacement.

## 2011-06-17 NOTE — H&P (View-Only) (Signed)
Sharon [name] is an 56 y.o. female.   Chief Complaint: right knee pain  HPI:The patient reports continued RIGHT knee pain with frequent falls. She also reports difficulty going up and down steps. Previous treatment includes current anti-inflammatories, oral analgesics including hydrocodone. She is also using a cane. She's had physical therapy in the past with no success. She indicates that she is ready to proceed with total knee replacement.  She understands risk and benefits of the procedure.   Radiographs were taken today which show joint space narrowing of the medial compartment with mild varus deformity approximately 8. There is surrounding osteophytes of the joint medially posteriorly and inferior to the patella.  Impression osteoarthritis of the RIGHT knee   Plan RIGHT total knee replacement  X-ray report 3 views of the RIGHT knee. Indication osteoarthritis, pain in preoperative x-ray evaluation  Findings: There is mild varus deformity of the knee with medial joint space narrowing moderate to severe. There is surrounding osteophytes especially the medial side of the joint as well as the posterior part of the joint and the inferior portion of the patella. Patella centered. The patella measured 21 mm in depth.  Impression osteoarthritis RIGHT knee moderate to severe   Past Medical History  Diagnosis Date  . GERD (gastroesophageal reflux disease)   . Pneumonia   . Migraine   . Melanoma   . Carpal tunnel syndrome     bilateral  . Arthritis   . Degenerative disk disease     mainly back    Past Surgical History  Procedure Date  . Appendectomy   . Tonsillectomy   . Vesicovaginal fistula closure w/ tah   . Total knee arthroplasty 2008    left   . Total knee revision 2.12.2012    left  . Menisus repair on rt knee   . Abdominal hysterectomy   . Tubal ligation   . Breast biopsy     right    Family History  Problem Relation Age of Onset  . Cancer      family history   .  Lung disease      family history   . Anesthesia problems Neg Hx   . Hypotension Neg Hx   . Malignant hyperthermia Neg Hx   . Pseudochol deficiency Neg Hx    Social History:  reports that she quit smoking about 5 years ago. Her smoking use included Cigarettes. She has a 20 pack-year smoking history. She does not have any smokeless tobacco history on file. She reports that she drinks alcohol. She reports that she does not use illicit drugs.  Allergies:  Allergies  Allergen Reactions  . Latex Other (See Comments)    Whelps   . Triple Antibiotic     REACTION: contact dematitis    No current facility-administered medications on file as of .   Medications Prior to Admission  Medication Sig Dispense Refill  . Calcium Carbonate-Vitamin D (CALCIUM + D) 600-200 MG-UNIT TABS Take by mouth 2 (two) times daily.        . fish oil-omega-3 fatty acids 1000 MG capsule Take 2 g by mouth daily.       . furosemide (LASIX) 20 MG tablet Take 20 mg by mouth 2 (two) times daily.        . HYDROcodone-acetaminophen (VICODIN) 5-500 MG per tablet Take 1 tablet by mouth every 4 (four) hours as needed. Pain       . potassium chloride SA (KLOR-CON M20) 20 MEQ tablet Take   20 mEq by mouth 2 (two) times daily.          No results found for this or any previous visit (from the past 48 hour(s)). No results found.  Review of Systems  Musculoskeletal: Positive for myalgias, back pain, joint pain and falls.  All other systems reviewed and are negative.    Physical Exam  Constitutional: Vital signs are normal. She appears well-developed and well-nourished. No distress.  HENT:  Head: Normocephalic and atraumatic.  Neck: Trachea normal, normal range of motion and full passive range of motion without pain. Neck supple. No JVD present. No spinous process tenderness and no muscular tenderness present. Carotid bruit is not present. No tracheal deviation and normal range of motion present. No thyromegaly present.    Cardiovascular: Normal rate and regular rhythm.   Respiratory: Effort normal. No respiratory distress.  GI: Soft. Normal appearance and bowel sounds are normal.  Musculoskeletal:       Right shoulder: Normal.       Left shoulder: Normal.       Right elbow: Normal.      Left elbow: Normal.       Right wrist: Normal.       Left wrist: Normal.       Right hip: Normal.       Left hip: Normal.       Right knee: She exhibits decreased range of motion, deformity, abnormal alignment and bony tenderness. She exhibits no swelling, no effusion, no ecchymosis, no laceration, no erythema, no LCL laxity, normal patellar mobility, normal meniscus and no MCL laxity. tenderness found. Medial joint line and lateral joint line tenderness noted. No MCL, no LCL and no patellar tendon tenderness noted.       Left knee: She exhibits decreased range of motion. She exhibits no swelling, no effusion, no deformity, normal alignment, no LCL laxity and no MCL laxity. no tenderness found.       Right ankle: Normal.       Left ankle: Normal.       Lumbar back: She exhibits decreased range of motion, tenderness and pain.  Lymphadenopathy:    She has no cervical adenopathy.       Right: No inguinal adenopathy present.       Left: No inguinal adenopathy present.  Neurological: She is alert. She has normal strength. No sensory deficit. Coordination normal.  Skin: Skin is warm, dry and intact. No pallor.  Psychiatric: She has a normal mood and affect. Her speech is normal and behavior is normal. Judgment and thought content normal. Her mood appears not anxious. Cognition and memory are normal.     Assessment/Plan RIGHT KNEE OA  Plan RIGHT total knee replacement  X-ray report 3 views of the RIGHT knee. Indication osteoarthritis, pain in preoperative x-ray evaluation  Findings: There is mild varus deformity of the knee with medial joint space narrowing moderate to severe. There is surrounding osteophytes especially the  medial side of the joint as well as the posterior part of the joint and the inferior portion of the patella. Patella centered. The patella measured 21 mm in depth.  Impression osteoarthritis RIGHT knee moderate to severe   Messiah Ahr 05/26/2011, 9:42 AM    

## 2011-06-17 NOTE — Progress Notes (Signed)
Spoke with MD regarding patients on-going pain and dilaudid 0.5mg  IV Q2H PRN ordered and Tylenol 1000mg  IV Q6H 4  doses total to be administered.

## 2011-06-17 NOTE — Transfer of Care (Signed)
Immediate Anesthesia Transfer of Care Note  Patient: Sharon [name]  Procedure(s) Performed:  TOTAL KNEE ARTHROPLASTY - DePuy  Patient Location: PACU  Anesthesia Type: Spinal  Level of Consciousness: awake, alert , oriented and patient cooperative  Airway & Oxygen Therapy: Patient Spontanous Breathing and Patient connected to face mask oxygen  Post-op Assessment: Report given to PACU RN and Post -op Vital signs reviewed and stable  Post vital signs: Reviewed and stable  Complications: No apparent anesthesia complications

## 2011-06-17 NOTE — Anesthesia Procedure Notes (Addendum)
Date/Time: 06/17/2011 7:33 AM Performed by: Carolyne Littles, Shalea Tomczak Pre-anesthesia Checklist: Patient identified, Timeout performed, Emergency Drugs available, Suction available and Patient being monitored Oxygen Delivery Method: Simple face mask    Spinal   Spinal  Start time: 06/17/2011 7:54 AM Staffing Anesthesiologist: Laurene Footman CRNA/Resident: ANDRAZA, Alonni Heimsoth Performed by: anesthesiologist and resident/CRNA  Preanesthetic Checklist Completed: patient identified, site marked, surgical consent, pre-op evaluation, timeout performed, IV checked, risks and benefits discussed and monitors and equipment checked Spinal Block Patient position: right lateral decubitus Prep: Betadine Patient monitoring: heart rate, cardiac monitor, continuous pulse ox and blood pressure Approach: right paramedian Location: L4-5 Needle Needle type: Spinocan  Needle gauge: 22 G Needle length: 12.7 cm Assessment Sensory level: T8 Additional Notes  Spinal attempt x 1 by Andraza,CRNA using aseptic technique at L4-5;  Spinal placed by Dr. Marcos Eke at L4-5 .75% Sensorcaine,.1epi and Fentanyl injected intrathecally at 0754;  Patient tolerated well.  Lot [id number]  Exp 2013 11

## 2011-06-17 NOTE — Interval H&P Note (Signed)
History and Physical Interval Note:  06/17/2011 7:24 AM  Sharon [name]  has presented today for surgery, with the diagnosis of OSTEOARTHRITIS RIGHT KNEE  The various methods of treatment have been discussed with the patient and family. After consideration of risks, benefits and other options for treatment, the patient has consented to  Procedure(s):RIGHT  TOTAL KNEE ARTHROPLASTY as a surgical intervention .  The patients' history has been reviewed, patient examined, no change in status, stable for surgery.  I have reviewed the patients' chart and labs.  Questions were answered to the patient's satisfaction.     Fuller Canada

## 2011-06-17 NOTE — Brief Op Note (Signed)
06/17/2011  10:01 AM  PATIENT:  Sharon [name]  56 y.o. female  PRE-OPERATIVE DIAGNOSIS:  OSTEOARTHRITIS RIGHT KNEE  POST-OPERATIVE DIAGNOSIS:  Osteoarthritis Right Knee  PROCEDURE:  Procedure(s): TOTAL KNEE ARTHROPLASTY  SURGEON:  Surgeon(s): Fuller Canada, MD  PHYSICIAN ASSISTANT:   ASSISTANTS:  BETTY ASHLEY,  Wayne mcfatter   ANESTHESIA:   spinal  RUE:AVWU   Total I/O In: 1000 [I.V.:1000] Out: 125 [Urine:75; Blood:50]  BLOOD ADMINISTERED:none  DRAINS: (1) Hemovact drain(s) in the JOINT with  Suction Open   LOCAL MEDICATIONS USED:  MARCAINE  WITH EPI 60 CC  SPECIMEN:  No Specimen  DISPOSITION OF SPECIMEN:  N/A  COUNTS:  YES  TOURNIQUET:   Total Tourniquet Time Documented: Thigh (Right) - 88 minutes  DICTATION: .[name] Dictation  PLAN OF CARE: Admit to inpatient   PATIENT DISPOSITION:  PACU - hemodynamically stable.   Delay start of Pharmacological VTE agent (>24hrs) due to surgical blood loss or risk of bleeding:  YES

## 2011-06-17 NOTE — Anesthesia Postprocedure Evaluation (Addendum)
  Anesthesia Post-op Note  Patient: Sharon [name]  Procedure(s) Performed:  TOTAL KNEE ARTHROPLASTY - DePuy  Patient Location: PACU  Anesthesia Type: Spinal  Level of Consciousness: awake, alert  and oriented  Airway and Oxygen Therapy: Patient connected to face mask oxygen  Post-op Pain: none  Post-op Assessment: Post-op Vital signs reviewed, Patient's Cardiovascular Status Stable, Respiratory Function Stable, Patent Airway and Pain level controlled  Post-op Vital Signs: Reviewed and stable  Complications: No apparent anesthesia complications 06/18/11  Doing well.  VSS and pain controlled.  No backache, headache.  Sensation returned to normal.  No apparent anesthesia complications.

## 2011-06-18 LAB — BASIC METABOLIC PANEL
CO2: 29 mEq/L (ref 19–32)
Calcium: 8.8 mg/dL (ref 8.4–10.5)
Creatinine, Ser: 0.67 mg/dL (ref 0.50–1.10)
Glucose, Bld: 144 mg/dL — ABNORMAL HIGH (ref 70–99)

## 2011-06-18 LAB — CBC
Hemoglobin: 11.7 g/dL — ABNORMAL LOW (ref 12.0–15.0)
MCH: 32.7 pg (ref 26.0–34.0)
MCHC: 33.4 g/dL (ref 30.0–36.0)
MCV: 97.8 fL (ref 78.0–100.0)
RBC: 3.58 MIL/uL — ABNORMAL LOW (ref 3.87–5.11)

## 2011-06-18 MED ORDER — OXYCODONE HCL 5 MG PO TABS
5.0000 mg | ORAL_TABLET | ORAL | Status: DC
  Administered 2011-06-18 – 2011-06-19 (×7): 5 mg via ORAL
  Filled 2011-06-18 (×7): qty 1

## 2011-06-18 MED ORDER — SODIUM CHLORIDE 0.9 % IJ SOLN
INTRAMUSCULAR | Status: AC
  Filled 2011-06-18: qty 3

## 2011-06-18 MED ORDER — SODIUM CHLORIDE 0.9 % IV SOLN
INTRAVENOUS | Status: DC
  Administered 2011-06-18: 20 mL/h via INTRAVENOUS

## 2011-06-18 NOTE — Progress Notes (Signed)
Physical Therapy Treatment Patient Details Name: Sharon [name] MRN: [id number] DOB: Aug 11, 1955 Today's Date: 06/18/2011  PT Assessment/Plan  PT - Assessment/Plan Comments on Treatment Session: AA ROM R knee is 5-70 deg...placed in CPM set aat 0-60 deg for 8 hours...ice packs are being used on pt.Marland KitchenMarland KitchenDr Romeo Apple, do you want [place] to begin the cryovac? PT Plan: Discharge plan remains appropriate;Frequency remains appropriate PT Frequency: 7X/week Follow Up Recommendations: Home health PT PT Goals  Acute Rehab PT Goals PT Goal Formulation: With patient Time For Goal Achievement: 7 days Pt will Ambulate: 16 - 50 feet;with modified independence;with rolling walker PT Goal: Ambulate - Progress: Progressing toward goal Pt will Go Up / Down Stairs: 3-5 stairs;with min assist;with rail(s) PT Goal: Up/Down Stairs - Progress: Goal set today Additional Goals Additional Goal #1: R knee P ROM to 4-80 deg  PT Treatment Precautions/Restrictions  Precautions Precautions: Knee Required Braces or Orthoses: No Restrictions Weight Bearing Restrictions: No Mobility (including Balance) Bed Mobility Bed Mobility: Yes Supine to Sit: 6: Modified independent (Device/Increase time);HOB elevated (Comment degrees) (HOB at 60 deg) Sit to Supine: 6: Modified independent (Device/Increase time) Transfers Transfers: Yes Sit to Stand: 6: Modified independent (Device/Increase time) Stand to Sit: 6: Modified independent (Device/Increase time) Stand Pivot Transfers: 6: Modified independent (Device/Increase time) Ambulation/Gait Ambulation/Gait: Yes Ambulation/Gait Assistance: 6: Modified independent (Device/Increase time) Ambulation Distance (Feet): 15 Feet Assistive device: Rolling walker Gait Pattern: Step-to pattern;Decreased stance time - right;Decreased step length - right;Antalgic Stairs: No Wheelchair Mobility Wheelchair Mobility: No  Posture/Postural Control Posture/Postural Control: No significant  limitations Balance Balance Assessed: No Exercise  Total Joint Exercises Ankle Circles/Pumps: AROM;Right;10 reps;Supine Quad Sets: AROM;Right;10 reps;Supine Short Arc Quad: AAROM;Right;10 reps;Supine Heel Slides: AAROM;Right;5 reps;Supine End of Session PT - End of Session Equipment Utilized During Treatment: Gait belt Activity Tolerance: Patient tolerated treatment well Patient left: in bed;in CPM;with call bell in reach;with family/visitor present General Behavior During Session: [facility] for tasks performed Cognition: [place] for tasks performed  Konrad Penta 06/18/2011, 10:51 AM

## 2011-06-18 NOTE — Addendum Note (Signed)
Addendum  created 06/18/11 0756 by Glynn Octave, CRNA   Modules edited:Notes Section

## 2011-06-18 NOTE — Addendum Note (Signed)
Addendum  created 06/18/11 0740 by Glynn Octave, CRNA   Modules edited:Notes Section

## 2011-06-18 NOTE — Progress Notes (Signed)
Subjective: 1 Day Post-Op Procedure(s) (LRB): TOTAL KNEE ARTHROPLASTY (Right) Patient reports pain as improved .    Objective: Vital signs in last 24 hours:  BP 132/79  Pulse 79  Temp(Src) 98.7 F (37.1 C) (Oral)  Resp 18  Ht 5' (1.524 m)  Wt 229 lb (103.874 kg)  BMI 44.72 kg/m2  SpO2 98%  Intake/Output from previous day: 01/21 0701 - 01/22 0700 In: 3970 [P.O.:360; I.V.:3610] Out: 1285 [Urine:775; Drains:285; Blood:50] Intake/Output this shift: Total I/O In: 1470 [I.V.:1470] Out: -   No results found for this basename: HGB:5 in the last 72 hours No results found for this basename: WBC:2,RBC:2,HCT:2,PLT:2 in the last 72 hours No results found for this basename: NA:2,K:2,CL:2,CO2:2,BUN:2,CREATININE:2,GLUCOSE:2,CALCIUM:2 in the last 72 hours No results found for this basename: LABPT:2,INR:2 in the last 72 hours  Neurologically intact Neurovascular intact Sensation intact distally Intact pulses distally Dorsiflexion/Plantar flexion intact Compartment soft  Assessment/Plan: 1 Day Post-Op Procedure(s) (LRB): TOTAL KNEE ARTHROPLASTY (Right) Advance diet Up with therapy D/C IV fluids  Sharon [name] 06/18/2011, 6:25 AM

## 2011-06-18 NOTE — Progress Notes (Signed)
CSW received referral for possible placement.  PT evaluated pt this morning and plan is for pt to return home with home health.  CSW to sign off.  Karn Cassis

## 2011-06-18 NOTE — Evaluation (Signed)
Physical Therapy Evaluation Patient Details Name: Sharon [name] MRN: [id number] DOB: [birth year] Today's Date: 06/18/2011  Problem List:  Patient Active Problem List  Diagnoses  . OSTEOARTHROSIS, LOCAL, PRIMARY, LOWER LEG  . Osteoarth NOS-L/Leg  . DERANGEMENT MENISCUS  . LOOSE BODY-KNEE  . SHOULDER PAIN  . KNEE PAIN  . HERNIATED LUMBOSACRAL DISC  . IMPINGEMENT SYNDROME  . TENOSYNOVITIS OF FOOT AND ANKLE  . UNSPECIFIED NEURALGIA NEURITIS AND RADICULITIS  . STRESS FRACTURE, TIBIA  . SUBLUXATION-RADIAL HEAD  . TEAR MEDIAL MENISCUS  . TOTAL KNEE FOLLOW-UP  . OTHER POSTSURGICAL STATUS OTHER  . Mononeuritis of leg  . Chronic pain  . OA (osteoarthritis) of knee  . Knee pain    Past Medical History:  Past Medical History  Diagnosis Date  . GERD (gastroesophageal reflux disease)   . Pneumonia   . Migraine   . Melanoma   . Carpal tunnel syndrome     bilateral  . Arthritis   . Degenerative disk disease     mainly back   Past Surgical History:  Past Surgical History  Procedure Date  . Appendectomy   . Tonsillectomy   . Vesicovaginal fistula closure w/ tah   . Total knee arthroplasty 2008    left   . Total knee revision 2.12.2012    left  . Menisus repair on rt knee     bilateral  . Abdominal hysterectomy   . Tubal ligation   . Breast biopsy     right  . Total knee arthroplasty 05/27/2011    Procedure: TOTAL KNEE ARTHROPLASTY;  Surgeon: Fuller Canada, MD;  Location: [facility];  Service: Orthopedics;  Laterality: Right;  DePuy    PT Assessment/Plan/Recommendation PT Assessment Clinical Impression Statement: very cooperative and pleasant pt who is very experienced in knee rehab from L knee surgery... PROM R knee is 7-64 deg...she is almost independent with transfers in/out of bed ...able to take a few steps with the walker...should have no difficulty to transition home at d/c with family support PT Recommendation/Assessment: Patient will need skilled PT in the acute  care venue PT Problem List: Decreased strength;Decreased range of motion;Decreased activity tolerance;Decreased mobility;Pain Barriers to Discharge: None PT Therapy Diagnosis : Difficulty walking;Abnormality of gait;Acute pain PT Plan PT Frequency: 7X/week PT Treatment/Interventions: Gait training;Stair training;Therapeutic activities;Therapeutic exercise;Patient/family education PT Recommendation Follow Up Recommendations: Home health PT PT Goals  Acute Rehab PT Goals PT Goal Formulation: With patient Time For Goal Achievement: 7 days Pt will Ambulate: 16 - 50 feet;with modified independence;with rolling walker Pt will Go Up / Down Stairs: 3-5 stairs;with min assist;with rail(s) Additional Goals Additional Goal #1: R knee P ROM to 4-80 deg  PT Evaluation Precautions/Restrictions  Precautions Precautions: Knee Required Braces or Orthoses: No Restrictions Weight Bearing Restrictions: No Prior Functioning  Home Living Lives With: Daughter Receives Help From: Family Type of Home: House Home Layout: One level Home Access: Stairs to enter Secretary/administrator of Steps: 3 Home Adaptive Equipment: Bedside commode/3-in-1;Straight cane;Walker - rolling Prior Function Level of Independence: Independent with basic ADLs;Independent with homemaking with ambulation;Independent with gait;Independent with transfers Cognition Cognition Arousal/Alertness: Awake/alert Overall Cognitive Status: Appears within functional limits for tasks assessed Orientation Level: Oriented X4 Sensation/Coordination Sensation Light Touch: Appears Intact Stereognosis: Not tested Hot/Cold: Not tested Proprioception: Not tested Coordination Gross Motor Movements are Fluid and Coordinated: Yes Extremity Assessment RUE Assessment RUE Assessment: Within Functional Limits LUE Assessment LUE Assessment: Within Functional Limits RLE Assessment RLE Assessment: Exceptions to [facility] RLE PROM (degrees) Right  Knee Extension 0-130: -7  Right Knee Flexion 0-140: 64  LLE Assessment LLE Assessment: Within Functional Limits Mobility (including Balance) Bed Mobility Bed Mobility: Yes Supine to Sit: 6: Modified independent (Device/Increase time);HOB elevated (Comment degrees) (HOB at 60 deg) Transfers Transfers: Yes Sit to Stand: 6: Modified independent (Device/Increase time) Stand to Sit: 6: Modified independent (Device/Increase time) Stand Pivot Transfers: 6: Modified independent (Device/Increase time) Ambulation/Gait Ambulation/Gait: Yes Ambulation/Gait Assistance: 6: Modified independent (Device/Increase time) Ambulation Distance (Feet): 3 Feet Gait Pattern: Step-to pattern;Decreased stance time - right;Decreased step length - right;Antalgic Stairs: No Wheelchair Mobility Wheelchair Mobility: No  Posture/Postural Control Posture/Postural Control: No significant limitations Balance Balance Assessed: No Exercise  Total Joint Exercises Ankle Circles/Pumps: AROM;Right;10 reps;Supine Quad Sets: AROM;Right;10 reps;Supine Short Arc Quad: AAROM;Right;10 reps;Supine Heel Slides: AAROM;Right;10 reps;Supine End of Session PT - End of Session Equipment Utilized During Treatment: Gait belt Activity Tolerance: Patient tolerated treatment well Patient left: in chair;with call bell in reach;with family/visitor present General Behavior During Session: [facility] for tasks performed Cognition: [facility] for tasks performed  Konrad Penta 06/18/2011, 9:23 AM

## 2011-06-18 NOTE — Progress Notes (Signed)
CARE MANAGEMENT NOTE 06/18/2011  Patient:  GRAZIA, TAFFE   Account Number:  [id number]  Date Initiated:  06/18/2011  Documentation initiated by:  Rosemary Holms  Subjective/Objective Assessment:   Pt admitted for Total knee surgery. PTA pt lived at home.     Action/Plan:   Pt. plans to be DC'd back home with [facility] and DME. Currently has DME other than the CPM machine. Anticipate PT needs.   Anticipated DC Date:  06/20/2011   Anticipated DC Plan:  HOME W HOME HEALTH SERVICES      DC Planning Services  CM consult      Choice offered to / List presented to:        DME agency  [employer]     HH arranged  HH-2 PT      [facility] agency  [facility]   Status of service:  In process, will continue to follow Medicare Important Message given?   (If response is "NO", the following Medicare IM given date fields will be blank) Date Medicare IM given:   Date Additional Medicare IM given:    Discharge Disposition:    Per UR Regulation:    Comments:  06/18/11 1000 Kasen Sako Leanord Hawking RN BSN

## 2011-06-19 LAB — CBC
MCH: 32 pg (ref 26.0–34.0)
MCHC: 33 g/dL (ref 30.0–36.0)
MCV: 97.1 fL (ref 78.0–100.0)
Platelets: 256 10*3/uL (ref 150–400)
RDW: 13.2 % (ref 11.5–15.5)

## 2011-06-19 MED ORDER — OXYCODONE HCL 10 MG PO TB12
10.0000 mg | ORAL_TABLET | Freq: Two times a day (BID) | ORAL | Status: DC
  Administered 2011-06-19 (×2): 10 mg via ORAL
  Filled 2011-06-19 (×2): qty 1

## 2011-06-19 MED ORDER — POLYETHYLENE GLYCOL 3350 17 G PO PACK
17.0000 g | PACK | Freq: Every day | ORAL | Status: DC
  Administered 2011-06-19: 17 g via ORAL
  Filled 2011-06-19: qty 1

## 2011-06-19 MED ORDER — OXYCODONE HCL 5 MG PO TABS
10.0000 mg | ORAL_TABLET | ORAL | Status: DC
  Administered 2011-06-19 – 2011-06-20 (×6): 10 mg via ORAL
  Filled 2011-06-19 (×6): qty 2

## 2011-06-19 NOTE — Progress Notes (Signed)
Physical Therapy Treatment Patient Details Name: Sharon [name] MRN: [id number] DOB: [birth year] Today's Date: 06/19/2011  PT Assessment/Plan  PT - Assessment/Plan Comments on Treatment Session: continues to do well.Marland KitchenMarland KitchenROM R knee is 4-78 deg, AA...increased edema in R knee this PM...plan to do stair training in AM PT Plan: Discharge plan remains appropriate;Frequency remains appropriate PT Goals  Acute Rehab PT Goals Pt will Ambulate: >150 feet PT Goal: Ambulate - Progress: Updated due to goal met Additional Goals PT Goal: Additional Goal #1 - Progress: Progressing toward goal  PT Treatment Precautions/Restrictions  Precautions Precautions: Knee Required Braces or Orthoses: No Restrictions Weight Bearing Restrictions: No Mobility (including Balance) Bed Mobility Supine to Sit: 6: Modified independent (Device/Increase time);HOB elevated (Comment degrees) Sit to Supine: 6: Modified independent (Device/Increase time) Transfers Sit to Stand: 6: Modified independent (Device/Increase time) Stand to Sit: 6: Modified independent (Device/Increase time) Stand Pivot Transfers: 6: Modified independent (Device/Increase time) Ambulation/Gait Ambulation/Gait: No Ambulation/Gait Assistance: 6: Modified independent (Device/Increase time) Ambulation Distance (Feet): 150 Feet Assistive device: Rolling walker Gait Pattern: Antalgic Stairs: No Wheelchair Mobility Wheelchair Mobility: No    Exercise  Total Joint Exercises Ankle Circles/Pumps: AROM;Right;10 reps;Supine Quad Sets: AROM;Right;10 reps;Supine Short Arc Quad: AAROM;Right;10 reps;Supine (not yet able to do independently) Heel Slides: AAROM;Right;10 reps;Supine End of Session PT - End of Session Equipment Utilized During Treatment: Gait belt Activity Tolerance: Patient tolerated treatment well Patient left: in bed;in CPM;with family/visitor present;with call bell in reach Nurse Communication: Mobility status for  transfers;Mobility status for ambulation General Behavior During Session: [facility] for tasks performed Cognition: [facility] for tasks performed  Konrad Penta 06/19/2011, 1:12 PM

## 2011-06-19 NOTE — Progress Notes (Signed)
Subjective: 2 Days Post-Op Procedure(s) (LRB): TOTAL KNEE ARTHROPLASTY (Right) Patient reports pain as varies 5-10.    Objective: Vital signs in last 24 hours: Temp:  [98.2 F (36.8 C)-98.7 F (37.1 C)] 98.2 F (36.8 C) (01/23 0650) Pulse Rate:  [65-95] 88  (01/23 0650) Resp:  [18-20] 20  (01/23 0650) BP: (128-156)/(74-80) 156/78 mmHg (01/23 0650) SpO2:  [95 %-100 %] 100 % (01/23 0650)  Intake/Output from previous day: 01/22 0701 - 01/23 0700 In: 320 [I.V.:320] Out: 1305 [Urine:1125; Drains:180] Intake/Output this shift:     Basename 06/19/11 0524 06/18/11 0527  HGB 12.1 11.7*    Basename 06/19/11 0524 06/18/11 0527  WBC 10.1 8.9  RBC 3.78* 3.58*  HCT 36.7 35.0*  PLT 256 261    Basename 06/18/11 0527  NA 137  K 4.0  CL 103  CO2 29  BUN 11  CREATININE 0.67  GLUCOSE 144*  CALCIUM 8.8   No results found for this basename: LABPT:2,INR:2 in the last 72 hours  Neurologically intact Neurovascular intact Sensation intact distally Intact pulses distally Dorsiflexion/Plantar flexion intact Incision: dressing C/D/I and no drainage Compartment soft  Assessment/Plan: 2 Days Post-Op Procedure(s) (LRB): TOTAL KNEE ARTHROPLASTY (Right) Up with therapy Plan for discharge tomorrow Discharge home with home health  Fuller Canada 06/19/2011, 8:29 AM

## 2011-06-19 NOTE — Progress Notes (Signed)
Physical Therapy Treatment Patient Details Name: LINETTA REGNER MRN: [id number] DOB: [birth year] Today's Date: 06/19/2011  PT Assessment/Plan  PT - Assessment/Plan Comments on Treatment Session: Progressing extremely well.Marland KitchenMarland KitchenAA ROM R knee = 4-75 deg with pain well controlled...able to ambulate 150' with walker,significant weight on RLE and not much pain...cryocuff stated this AM as order was found in chart PT Plan: Discharge plan remains appropriate;Frequency remains appropriate PT Goals  Acute Rehab PT Goals Pt will Ambulate: >150 feet PT Goal: Ambulate - Progress: Updated due to goal met Additional Goals PT Goal: Additional Goal #1 - Progress: Progressing toward goal  PT Treatment Precautions/Restrictions  Precautions Precautions: Knee Required Braces or Orthoses: No Restrictions Weight Bearing Restrictions: No Mobility (including Balance) Bed Mobility Supine to Sit: 6: Modified independent (Device/Increase time);HOB elevated (Comment degrees) Transfers Sit to Stand: 6: Modified independent (Device/Increase time) Stand to Sit: 6: Modified independent (Device/Increase time) Stand Pivot Transfers: 6: Modified independent (Device/Increase time) Ambulation/Gait Ambulation/Gait: Yes Ambulation/Gait Assistance: 6: Modified independent (Device/Increase time) Ambulation Distance (Feet): 150 Feet Assistive device: Rolling walker Gait Pattern: Antalgic Stairs: No Wheelchair Mobility Wheelchair Mobility: No    Exercise  Total Joint Exercises Ankle Circles/Pumps: AROM;Both;10 reps;Supine Quad Sets: AROM;Both;10 reps;Supine Short Arc Quad: AAROM;Right;10 reps;Supine Heel Slides: AAROM;Right;10 reps;Supine End of Session PT - End of Session Equipment Utilized During Treatment: Gait belt Activity Tolerance: Patient tolerated treatment well Patient left: in chair;with call bell in reach;with family/visitor present General Behavior During Session: [facility] for tasks  performed Cognition: [facility] for tasks performed  Konrad Penta 06/19/2011, 10:05 AM

## 2011-06-19 NOTE — Progress Notes (Signed)
Physical Therapy Treatment Patient Details Name: Sharon [name] MRN: [id number] DOB: Aug 10, 1955 Today's Date: 06/19/2011  PT Assessment/Plan  PT - Assessment/Plan Comments on Treatment Session: Progressing extremely well.Marland KitchenMarland KitchenAA ROM R knee = 4-75 deg with pain well controlled...able to ambulate 150' with walker,significant weight on RLE and not much pain...cryocuff stated this AM as order was found in chart PT Plan: Discharge plan remains appropriate;Frequency remains appropriate PT Goals  Acute Rehab PT Goals Pt will Ambulate: >150 feet PT Goal: Ambulate - Progress: Updated due to goal met Additional Goals PT Goal: Additional Goal #1 - Progress: Progressing toward goal  PT Treatment Precautions/Restrictions  Precautions Precautions: Knee Required Braces or Orthoses: No Restrictions Weight Bearing Restrictions: No Mobility (including Balance) Bed Mobility Supine to Sit: 6: Modified independent (Device/Increase time);HOB elevated (Comment degrees) Transfers Sit to Stand: 6: Modified independent (Device/Increase time) Stand to Sit: 6: Modified independent (Device/Increase time) Stand Pivot Transfers: 6: Modified independent (Device/Increase time) Ambulation/Gait Ambulation/Gait: Yes Ambulation/Gait Assistance: 6: Modified independent (Device/Increase time) Ambulation Distance (Feet): 150 Feet Assistive device: Rolling walker Gait Pattern: Antalgic Stairs: No Wheelchair Mobility Wheelchair Mobility: No    Exercise  Total Joint Exercises Ankle Circles/Pumps: AROM;Both;10 reps;Supine Quad Sets: AROM;Both;10 reps;Supine Short Arc Quad: AAROM;Right;10 reps;Supine Heel Slides: AAROM;Right;10 reps;Supine End of Session PT - End of Session Equipment Utilized During Treatment: Gait belt Activity Tolerance: Patient tolerated treatment well Patient left: in chair;with call bell in reach;with family/visitor present General Behavior During Session: [facility] for tasks  performed Cognition: [facility] for tasks performed  Konrad Penta 06/19/2011, 10:01 AM

## 2011-06-20 ENCOUNTER — Ambulatory Visit: Payer: Medicare Other | Admitting: Orthopedic Surgery

## 2011-06-20 LAB — CBC
MCHC: 33.2 g/dL (ref 30.0–36.0)
Platelets: 248 10*3/uL (ref 150–400)
RDW: 13.2 % (ref 11.5–15.5)

## 2011-06-20 MED ORDER — OXYCODONE HCL 10 MG PO TB12: 10.0000 mg | ORAL_TABLET | Freq: Two times a day (BID) | ORAL | Status: AC

## 2011-06-20 MED ORDER — OXYCODONE-ACETAMINOPHEN 5-325 MG PO TABS: 1.0000 | ORAL_TABLET | ORAL | Status: DC | PRN

## 2011-06-20 MED ORDER — MORPHINE SULFATE 4 MG/ML IJ SOLN
INTRAMUSCULAR | Status: AC
  Filled 2011-06-20: qty 1

## 2011-06-20 MED ORDER — POLYETHYLENE GLYCOL 3350 17 G PO PACK: 17.0000 g | PACK | Freq: Every day | ORAL | Status: AC

## 2011-06-20 MED ORDER — METHOCARBAMOL 500 MG PO TABS: 500.0000 mg | ORAL_TABLET | Freq: Four times a day (QID) | ORAL | Status: DC | PRN

## 2011-06-20 MED ORDER — DSS 100 MG PO CAPS: 100.0000 mg | ORAL_CAPSULE | Freq: Two times a day (BID) | ORAL | Status: AC

## 2011-06-20 MED ORDER — ASPIRIN 325 MG PO TBEC: 325.0000 mg | DELAYED_RELEASE_TABLET | Freq: Two times a day (BID) | ORAL | Status: AC

## 2011-06-20 MED ORDER — BISACODYL 5 MG PO TBEC: 5.0000 mg | DELAYED_RELEASE_TABLET | Freq: Every day | ORAL | Status: AC | PRN

## 2011-06-20 NOTE — Progress Notes (Signed)
CARE MANAGEMENT NOTE 06/20/2011  Patient:  ELWYN, LOWDEN   Account Number:  [id number]  Date Initiated:  06/18/2011  Documentation initiated by:  Rosemary Holms  Subjective/Objective Assessment:   Pt admitted for Total knee surgery. PTA pt lived at home.     Action/Plan:   Pt. plans to be DC'd back home with [facility] and DME. Currently has DME other than the CPM machine. Anticipate PT needs.   Anticipated DC Date:  06/20/2011   Anticipated DC Plan:  HOME W HOME HEALTH SERVICES      DC Planning Services  CM consult      Choice offered to / List presented to:        DME agency  MEDICAL MODALITIES     HH arranged  HH-2 PT  HH-3 OT  HH-4 NURSE'S AIDE      HH agency  [facility]   Status of service:  Completed, signed off Medicare Important Message given?   (If response is "NO", the following Medicare IM given date fields will be blank) Date Medicare IM given:   Date Additional Medicare IM given:    Discharge Disposition:  HOME W HOME HEALTH SERVICES  Per UR Regulation:    Comments:  06/20/11 1000 Herve Haug Leanord Hawking RN BSN CM Pt has already arranged all DME needs herself. [facility] to provide PT, OT and HH Aide. [facility] notifed of DC today  06/18/11 1000 Olamae Ferrara [employer] RN BSN

## 2011-06-20 NOTE — Progress Notes (Signed)
Subjective: 3 Days Post-Op Procedure(s) (LRB): TOTAL KNEE ARTHROPLASTY (Right) Patient reports pain as variable usually 5 but can be 10  Objective: Vital signs in last 24 hours: Temp:  [98.5 F (36.9 C)-99.2 F (37.3 C)] 98.5 F (36.9 C) (01/24 0514) Pulse Rate:  [82-97] 82  (01/24 0514) Resp:  [16-18] 18  (01/24 0514) BP: (110-127)/(60-81) 123/81 mmHg (01/24 0514) SpO2:  [94 %-97 %] 94 % (01/24 0514)  Intake/Output from previous day: 01/23 0701 - 01/24 0700 In: 1424.7 [P.O.:1080; I.V.:344.7] Out: -  Intake/Output this shift:     Basename 06/20/11 0459 06/19/11 0524 06/18/11 0527  HGB 11.1* 12.1 11.7*    Basename 06/20/11 0459 06/19/11 0524  WBC 8.8 10.1  RBC 3.44* 3.78*  HCT 33.4* 36.7  PLT 248 256    Basename 06/18/11 0527  NA 137  K 4.0  CL 103  CO2 29  BUN 11  CREATININE 0.67  GLUCOSE 144*  CALCIUM 8.8   No results found for this basename: LABPT:2,INR:2 in the last 72 hours  Neurologically intact  Assessment/Plan: 3 Days Post-Op Procedure(s) (LRB): TOTAL KNEE ARTHROPLASTY (Right) D/C IV fluids Discharge home with home health  Fuller Canada 06/20/2011, 7:45 AM

## 2011-06-20 NOTE — Discharge Summary (Signed)
Physician Discharge Summary  Patient ID: Sharon [name] MRN: [id number] DOB/AGE: 1956-05-13 56 y.o.  Admit date: 06/17/2011 Discharge date: 06/20/2011  Admission Diagnoses: Osteoarthritis right knee Discharge Diagnoses: Osteoarthritis right knee Active Problems:  * No active hospital problems. *    Discharged Condition: good  Hospital Course: The patient was admitted on 06/17/2011 4 right total knee arthroplasty. The patient tolerated the procedure well under spinal anesthetic. Postoperatively pain was managed with OxyContin and OxyIR. The patient ambulated 150 feet at 70 of knee flexion and was discharged to home.  Consults: Physical therapy  Significant Diagnostic Studies: labs:  CBC    Component Value Date/Time   WBC 8.8 06/20/2011 0459   RBC 3.44* 06/20/2011 0459   HGB 11.1* 06/20/2011 0459   HCT 33.4* 06/20/2011 0459   PLT 248 06/20/2011 0459   MCV 97.1 06/20/2011 0459   MCH 32.3 06/20/2011 0459   MCHC 33.2 06/20/2011 0459   RDW 13.2 06/20/2011 0459   LYMPHSABS 3.3 06/13/2011 1130   MONOABS 0.7 06/13/2011 1130   EOSABS 0.3 06/13/2011 1130   BASOSABS 0.0 06/13/2011 1130    BMET    Component Value Date/Time   NA 137 06/18/2011 0527   K 4.0 06/18/2011 0527   CL 103 06/18/2011 0527   CO2 29 06/18/2011 0527   GLUCOSE 144* 06/18/2011 0527   BUN 11 06/18/2011 0527   CREATININE 0.67 06/18/2011 0527   CALCIUM 8.8 06/18/2011 0527   GFRNONAA >90 06/18/2011 0527   GFRAA >90 06/18/2011 0527      Treatments: surgery: Right total knee arthroplasty with [place] fixed-bearing posterior stabilized total knee implants. Size 2 femur, size 2 tibia, size 10 PS polyethylene insert size 32 patella  Discharge Exam: Blood pressure 123/81, pulse 82, temperature 98.5 F (36.9 C), temperature source Oral, resp. rate 18, height 5' (1.524 m), weight 103.874 kg (229 lb), SpO2 94.00%. General appearance: alert, cooperative and no distress Neurologic: Grossly normal Incision/Wound: no issues with the wound  appeared clean dry and intact with no significant drainage  Disposition: Home or Self Care  Discharge Orders    Future Appointments: Provider: Department: Dept Phone: Center:   07/03/2011 10:15 AM Fuller Canada, MD [facility] [phone] [facility]     Future Orders Please Complete By Expires   Diet - low sodium heart healthy      Call MD / Call 911      Comments:   If you experience chest pain or shortness of breath, CALL 911 and be transported to the hospital emergency room.  If you develope a fever above 101 F, pus (white drainage) or increased drainage or redness at the wound, or calf pain, call your surgeon's office.   Constipation Prevention      Comments:   Drink plenty of fluids.  Prune juice may be helpful.  You may use a stool softener, such as Colace (over the counter) 100 mg twice a day.  Use MiraLax (over the counter) for constipation as needed.   Increase activity slowly as tolerated      Weight Bearing as taught in Physical Therapy      Comments:   Use a walker or crutches as instructed.   Discharge instructions      Comments:   CPM 6 HORS PER DAY START AT 70 DEGREES ADV 10 DEGREES PER DAY AS TOLERATED X 3 WEEKS   Driving restrictions      Comments:   No driving for 4 weeks   TED hose  Comments:   Use stockings (TED hose) for 6 weeks on BOTH leg(s).  You may remove them at night for sleeping.   Change dressing      Comments:   Change dressing DAILY, You may clean the incision with alcohol prior to redressing.   Do not put a pillow under the knee. Place it under the heel.      CPM      Comments:   Continuous passive motion machine (CPM):      Use the CPM from 0 to 70 for 6 hours per day.      You may increase by 10 DEGREES per day.  You may break it up into 2 or 3 sessions per day.      Use CPM for 3 weeks or until you are told to stop.     Medication List  As of 06/20/2011  8:05 AM   STOP taking these medications         HYDROcodone-acetaminophen  5-500 MG per tablet      ibuprofen 200 MG tablet         TAKE these medications         albuterol 108 (90 BASE) MCG/ACT inhaler   Commonly known as: PROVENTIL HFA;VENTOLIN HFA   Inhale 2 puffs into the lungs every 6 (six) hours as needed. For shortness of breath      ALPRAZolam 0.5 MG tablet   Commonly known as: XANAX   Take 0.25-0.5 mg by mouth at bedtime as needed. For anxiety      aspirin 325 MG EC tablet   Take 1 tablet (325 mg total) by mouth 2 (two) times daily.      bisacodyl 5 MG EC tablet   Commonly known as: DULCOLAX   Take 1 tablet (5 mg total) by mouth daily as needed.      Calcium + D 600-200 MG-UNIT Tabs   Generic drug: Calcium Carbonate-Vitamin D   Take 1 tablet by mouth 2 (two) times daily.      DSS 100 MG Caps   Take 100 mg by mouth 2 (two) times daily.      fish oil-omega-3 fatty acids 1000 MG capsule   Take 2 g by mouth at bedtime.      KLOR-CON M20 20 MEQ tablet   Generic drug: potassium chloride SA   Take 20 mEq by mouth every morning.      LASIX 20 MG tablet   Generic drug: furosemide   Take 20 mg by mouth every morning.      methocarbamol 500 MG tablet   Commonly known as: ROBAXIN   Take 1 tablet (500 mg total) by mouth every 6 (six) hours as needed. For back pain      mulitivitamin with minerals Tabs   Take 1 tablet by mouth at bedtime.      oxyCODONE 10 MG 12 hr tablet   Commonly known as: OXYCONTIN   Take 1 tablet (10 mg total) by mouth every 12 (twelve) hours.      oxyCODONE-acetaminophen 5-325 MG per tablet   Commonly known as: PERCOCET   Take 1 tablet by mouth every 4 (four) hours as needed for pain.      pantoprazole 40 MG tablet   Commonly known as: PROTONIX   Take 40 mg by mouth at bedtime.      polyethylene glycol packet   Commonly known as: MIRALAX / GLYCOLAX   Take 17 g by mouth daily.  Follow-up Information    Follow up with Fuller Canada, MD on 07/01/2011. (MAKE APPT , CALL THE OFFICE FOR THE TME )     Contact information:   [address] [place] [address] [phone]         The patient will call the office for appointment on February 4. Patient will have a wound check at that time and staple removal. Refill pain medications as needed at that time.  DVT prophylaxis TED hose and Ecotrin twice a day for 6 weeks.  Signed: Fuller Canada 06/20/2011, 8:05 AM

## 2011-06-20 NOTE — Progress Notes (Signed)
Pt discharged home with family with home health in place.  Pt instructed on new medications and discharge instructions.  Incision WNL and dressing changed prior to discharge.  Pt verbalizes understanding of discharge instructions.  follow up appt in place.

## 2011-06-20 NOTE — Progress Notes (Signed)
Physical Therapy Treatment Patient Details Name: VIKI CARRERA MRN: [id number] DOB: Sep 30, 1955 Today's Date: 06/20/2011  PT Assessment/Plan  PT - Assessment/Plan Comments on Treatment Session: ROM R knee =4-82 deg...all acute care goals met PT Plan: All goals met and education completed, patient dischaged from PT services PT Goals  Acute Rehab PT Goals PT Goal: Ambulate - Progress: Met PT Goal: Up/Down Stairs - Progress: Met Additional Goals PT Goal: Additional Goal #1 - Progress: Met  PT Treatment Precautions/Restrictions  Precautions Precautions: Knee Required Braces or Orthoses: No Restrictions Weight Bearing Restrictions: No Mobility (including Balance) Ambulation/Gait Ambulation/Gait Assistance: 6: Modified independent (Device/Increase time) Ambulation Distance (Feet): 175 Feet Assistive device: Rolling walker Stairs: Yes Stairs Assistance: 5: Supervision Stair Management Technique: One rail Right;Sideways Number of Stairs: 3     Exercise  Total Joint Exercises Ankle Circles/Pumps: AROM;Right;10 reps;Supine Quad Sets: AROM;Right;10 reps;Supine Short Arc Quad: AAROM;Right;10 reps;Supine Heel Slides: AAROM;Right;10 reps;Supine End of Session PT - End of Session Equipment Utilized During Treatment: Gait belt Activity Tolerance: Patient tolerated treatment well Patient left: in bed General Behavior During Session: [facility] for tasks performed Cognition: [facility] for tasks performed  Konrad Penta 06/20/2011, 9:57 AM

## 2011-06-24 ENCOUNTER — Encounter (HOSPITAL_COMMUNITY): Payer: Self-pay | Admitting: Orthopedic Surgery

## 2011-06-25 ENCOUNTER — Telehealth: Payer: Self-pay | Admitting: Orthopedic Surgery

## 2011-06-25 NOTE — Telephone Encounter (Signed)
Sharon [name]/[facility] said that [name] was offered a home health aide but refused, because she said she did not need one.

## 2011-07-03 ENCOUNTER — Encounter: Payer: Self-pay | Admitting: Orthopedic Surgery

## 2011-07-03 ENCOUNTER — Ambulatory Visit (INDEPENDENT_AMBULATORY_CARE_PROVIDER_SITE_OTHER): Payer: Medicare Other | Admitting: Orthopedic Surgery

## 2011-07-03 VITALS — BP 132/90

## 2011-07-03 DIAGNOSIS — Z96659 Presence of unspecified artificial knee joint: Secondary | ICD-10-CM

## 2011-07-03 MED ORDER — OXYCODONE-ACETAMINOPHEN 5-325 MG PO TABS: 1.0000 | ORAL_TABLET | ORAL | Status: AC | PRN

## 2011-07-03 NOTE — Progress Notes (Signed)
Patient ID: Sharon [name], female   DOB: 1955-08-30, 56 y.o.   MRN: [id number]   Postop visit status post knee replacement, RIGHT lower extremity.  Surgery date January 21  Therapy notes are included with today's visit. Patient progressing well, and bleeding 150 feet, knee flexion angle 96.  Staples taken out. Incision is clean.  Patient will start therapy as an outpatient and followup in 4 weeks.  Continue Percocet for pain she will taper off OxyContin over the next 2 weeks

## 2011-07-03 NOTE — Patient Instructions (Signed)
Start outpatient PT  

## 2011-07-08 ENCOUNTER — Ambulatory Visit (HOSPITAL_COMMUNITY)
Admission: RE | Admit: 2011-07-08 | Discharge: 2011-07-08 | Disposition: A | Discharge status: Home / Self Care | Payer: Medicare Other | Source: Ambulatory Visit | Attending: Orthopedic Surgery | Admitting: Orthopedic Surgery

## 2011-07-08 DIAGNOSIS — M25569 Pain in unspecified knee: Secondary | ICD-10-CM | POA: Insufficient documentation

## 2011-07-08 DIAGNOSIS — M6281 Muscle weakness (generalized): Secondary | ICD-10-CM | POA: Insufficient documentation

## 2011-07-08 DIAGNOSIS — IMO0001 Reserved for inherently not codable concepts without codable children: Secondary | ICD-10-CM | POA: Insufficient documentation

## 2011-07-08 DIAGNOSIS — M25469 Effusion, unspecified knee: Secondary | ICD-10-CM | POA: Insufficient documentation

## 2011-07-08 NOTE — Evaluation (Signed)
Physical Therapy Evaluation  Patient Details  Name: Sharon [name] MRN: [id number] Date of Birth: May 28, 1955  Today's Date: 07/08/2011 Time: 7829-5621 Time Calculation (min): 40 min  Visit#: 1  of 12   Re-eval: 08/07/11 Assessment Diagnosis: Right TKR Surgical Date: 06/17/11 Next MD Visit: 07/31/11 Prior Therapy: home health  Past Medical History:  Past Medical History  Diagnosis Date  . GERD (gastroesophageal reflux disease)   . Pneumonia   . Migraine   . Melanoma   . Carpal tunnel syndrome     bilateral  . Arthritis   . Degenerative disk disease     mainly back   Past Surgical History:  Past Surgical History  Procedure Date  . Appendectomy   . Tonsillectomy   . Vesicovaginal fistula closure w/ tah   . Total knee arthroplasty 2008    left   . Total knee revision 2.12.2012    left  . Menisus repair on rt knee     bilateral  . Abdominal hysterectomy   . Tubal ligation   . Breast biopsy     right  . Total knee arthroplasty 05/27/2011    Procedure: TOTAL KNEE ARTHROPLASTY;  Surgeon: Fuller Canada, MD;  Location: [facility];  Service: Orthopedics;  Laterality: Right;  DePuy  . Total knee arthroplasty 06/17/2011    Procedure: TOTAL KNEE ARTHROPLASTY;  Surgeon: Fuller Canada, MD;  Location: [facility];  Service: Orthopedics;  Laterality: Right;  DePuy    Subjective Symptoms/Limitations Symptoms: Pt states that she had her knee opreation on 06/17/11.  She was in the hospital and then recieved home health. Her last day of home health was 07/03/11.  The patient is now being referred to Pt improve her ROM, walking and fuctional mobilityl How long can you sit comfortably?: The patient is able to sit for ;ten to fifteen minutes. How long can you stand comfortably?: The patient is able to stand for twenty minutes. How long can you walk comfortably?: The patient is now ambulating with a walke she is using her cane in her home about 50% of the time..  She states that she has walked  for twenty minutes without stopping. Special Tests: The patient states she has a fear of falling.  The patient states in the last few nights she has been waking up about three times a night. Pain Assessment Currently in Pain?: Yes Pain Score:   6 (worst in the past week 10; least pain 4.) Pain Location: Knee Pain Orientation: Right Pain Type: Surgical pain Pain Onset: 1 to 4 weeks ago Pain Frequency: Constant Pain Relieving Factors: ice; using 4-5 times a day. Effect of Pain on Daily Activities: increases pain Multiple Pain Sites: No   Prior Functioning  Home Living Lives With: Daughter Receives Help From: Family Type of Home: House Home Layout: One level Home Access: Stairs to enter Entrance Stairs-Rails: None Entrance Stairs-Number of Steps:  (3 going up sideways.) Home Adaptive Equipment: Bedside commode/3-in-1  Cognition/Observation Cognition Arousal/Alertness: Awake/alert Orientation Level: Oriented X4   Assessment RLE PROM (degrees) Right Knee Extension 0-130: 12  Right Knee Flexion 0-140: 90  RLE Strength Right Hip Flexion: 4/5 Right Hip Extension: 4/5 Right Hip ABduction: 3/5 Right Hip ADduction: 3+/5 Right Knee Flexion: 3/5 Right Knee Extension: 3/5 Right Ankle Dorsiflexion: 4/5  Exercise/Treatments Mobility/Balance  Transfers Sit to Stand: 6: Modified independent (Device/Increase time) Stand to Sit: 6: Modified independent (Device/Increase time)     Seated Long Arc Quad: Right;10 reps Other Seated Knee Exercises:  (Ankle  DF x 10) Supine Quad Sets: Strengthening;Right;10 reps;Limitations Quad Sets Limitations: pull back with heel first then push down. Heel Slides: Right;10 reps Terminal Knee Extension: Right;10 reps Sidelying Hip ABduction: Strengthening;Right;10 reps  Modalities Modalities: Cryotherapy Cryotherapy Number Minutes Cryotherapy: 10 Minutes Cryotherapy Location: Knee Type of Cryotherapy: Ice pack  Physical Therapy Assessment and  Plan PT Assessment and Plan Clinical Impression Statement: Pt s/p R TKR with swelling, pain, decreased ROM and strength who will benefit from skilled PT to improve her functional mobility and decrease her pain to improve her quality of life. Rehab Potential: Good Clinical Impairments Affecting Rehab Potential: pain, stiffness, decreased ROM, decreased strength. PT Frequency: Min 3X/week PT Duration: 4 weeks PT Treatment/Interventions: Gait training;Therapeutic exercise;Other (comment) (modalities as needed to decrease pain.) PT Plan: begin bike; terminal extension, rockerboard, heel raise, functional squats; prone knee flex and prone hip extension next treatment.    Goals Home Exercise Program Pt will Perform Home Exercise Program: Independently PT Short Term Goals PT Short Term Goal 1: Pt flexion to be to 105 to allow pt to be able to sit for an hour for car riding  PT Short Term Goal 2: Pt to be using cane inside and out. PT Short Term Goal 3: Pain level to be decreased by three levels PT Long Term Goals Time to Complete Long Term Goals: 4 weeks PT Long Term Goal 1: ROM to be up to 120 to allow squatting to pick items off the floor easily. PT Long Term Goal 2: Pt pain to be no greater than a 2 and to no longer have the need of pain medication. Long Term Goal 3: Pt to be ambulating in the house without an assistive device.  Problem List Patient Active Problem List  Diagnoses  . OSTEOARTHROSIS, LOCAL, PRIMARY, LOWER LEG  . Osteoarth NOS-L/Leg  . DERANGEMENT MENISCUS  . LOOSE BODY-KNEE  . SHOULDER PAIN  . KNEE PAIN  . HERNIATED LUMBOSACRAL DISC  . IMPINGEMENT SYNDROME  . TENOSYNOVITIS OF FOOT AND ANKLE  . UNSPECIFIED NEURALGIA NEURITIS AND RADICULITIS  . STRESS FRACTURE, TIBIA  . SUBLUXATION-RADIAL HEAD  . TEAR MEDIAL MENISCUS  . TOTAL KNEE FOLLOW-UP  . OTHER POSTSURGICAL STATUS OTHER  . Mononeuritis of leg  . Chronic pain  . OA (osteoarthritis) of knee  . Knee pain  . S/P  total knee replacement    PT - End of Session Activity Tolerance: Patient tolerated treatment well General Behavior During Session: [facility] for tasks performed   RUSSELL,CINDY 07/08/2011, 12:17 PM  Physician Documentation Your signature is required to indicate approval of the treatment plan as stated above.  Please sign and either send electronically or make a copy of this report for your files and return this physician signed original.   Please mark one 1.__approve of plan  2. ___approve of plan with the following conditions.   ______________________________                                                          _____________________ Physician Signature  Date  

## 2011-07-08 NOTE — Patient Instructions (Addendum)
HEP; ice

## 2011-07-10 ENCOUNTER — Ambulatory Visit (HOSPITAL_COMMUNITY)
Admission: RE | Admit: 2011-07-10 | Discharge: 2011-07-10 | Disposition: A | Discharge status: Home / Self Care | Payer: Medicare Other | Source: Ambulatory Visit | Attending: Orthopedic Surgery | Admitting: Orthopedic Surgery

## 2011-07-10 NOTE — Progress Notes (Signed)
Physical Therapy Treatment Patient Details  Name: JANIS SOL MRN: [id number] Date of Birth: Jan 31, 1956  Today's Date: 07/10/2011 Time: 4098-1191 Time Calculation (min): 54 min Visit#: 2  of 12   Re-eval: 08/07/11 Charges: Therex x 34' Ice x 10'  Subjective: Symptoms/Limitations Symptoms: It's still hurting but it's much betterthan it was. Pain Assessment Currently in Pain?: Yes Pain Score:   7 Pain Location: Knee Pain Orientation: Right   Exercise/Treatments Aerobic Stationary Bike: 6' seat 10 rocking, bck and fwd revolution Standing Heel Raises: 10 reps Functional Squat: 10 reps Rocker Board: 1 minute Seated Long Arc Quad: Right;10 reps;Weights Long Arc Quad Weight: 2 lbs. Other Seated Knee Exercises: R DF x 10 Supine Quad Sets: Strengthening;Right;10 reps;Limitations Short Arc Quad Sets: 10 reps;Right Sidelying Hip ABduction: 15 reps;Right Prone  Hamstring Curl: 10 reps Hip Extension: 10 reps   Modalities Modalities: Cryotherapy Cryotherapy Number Minutes Cryotherapy: 10 Minutes Cryotherapy Location: Knee (Right) Type of Cryotherapy: Ice pack  Physical Therapy Assessment and Plan PT Assessment and Plan Clinical Impression Statement: Pt tolerates exercises well. Pt has most difficulty with prone hip extension but no c/o increased pain. Ice applied to R knee at end of session to limit pain and swelling. PT Plan: Continue per PT POC. Begin forward and lateral step ups next session.    Goals    Problem List Patient Active Problem List  Diagnoses  . OSTEOARTHROSIS, LOCAL, PRIMARY, LOWER LEG  . Osteoarth NOS-L/Leg  . DERANGEMENT MENISCUS  . LOOSE BODY-KNEE  . SHOULDER PAIN  . KNEE PAIN  . HERNIATED LUMBOSACRAL DISC  . IMPINGEMENT SYNDROME  . TENOSYNOVITIS OF FOOT AND ANKLE  . UNSPECIFIED NEURALGIA NEURITIS AND RADICULITIS  . STRESS FRACTURE, TIBIA  . SUBLUXATION-RADIAL HEAD  . TEAR MEDIAL MENISCUS  . TOTAL KNEE FOLLOW-UP  . OTHER POSTSURGICAL  STATUS OTHER  . Mononeuritis of leg  . Chronic pain  . OA (osteoarthritis) of knee  . Knee pain  . S/P total knee replacement    PT - End of Session Activity Tolerance: Patient tolerated treatment well General Behavior During Session: [facility] for tasks performed Cognition: [facility] for tasks performed  Antonieta Iba 07/10/2011, 12:02 PM

## 2011-07-12 ENCOUNTER — Ambulatory Visit (HOSPITAL_COMMUNITY)
Admission: RE | Admit: 2011-07-12 | Discharge: 2011-07-12 | Disposition: A | Discharge status: Home / Self Care | Payer: Medicare Other | Source: Ambulatory Visit | Attending: Physical Therapy | Admitting: Physical Therapy

## 2011-07-12 NOTE — Progress Notes (Signed)
Physical Therapy Treatment Patient Details  Name: Sharon [name] MRN: [id number] Date of Birth: 06/15/1955  Today's Date: 07/12/2011 Time: 4098-1191 Time Calculation (min): 63 min Visit#: 3  of 12   Re-eval: 08/07/11  Charge: therex 47 min Ice 10 min  Subjective: Symptoms/Limitations Symptoms: It's still hurting but alot better than other knee was.  Pain scale 7/10, premedicated around 3:30 this afternoon.  Pt stated she was completing the abduction exercises at home, hurt hip requested hold today. Pain Assessment Currently in Pain?: Yes Pain Score:   7 Pain Location: Knee Pain Orientation: Right  Objective:   Exercise/Treatments Aerobic Stationary Bike: 6' @ 1.0 full revolution Standing Heel Raises: 2 sets;10 reps;Limitations Heel Raises Limitations: toe raises 2x 10 Knee Flexion: 10 reps Lateral Step Up: Right;10 reps;Step Height: 4";Hand Hold: 2 Forward Step Up: Right;10 reps;Step Height: 4";Hand Hold: 1 Functional Squat: 10 reps;3 seconds Rocker Board: 2 minutes;Limitations Rocker Board Limitations: R/L x 2'; A/P x 1" Seated Long Arc Quad: Right;15 reps;Weights Long Arc Quad Weight: 2 lbs. Other Seated Knee Exercises: R DF x 10 2# Supine Quad Sets: 15 reps;Limitations Quad Sets Limitations: 5" hold Short Arc Quad Sets: 10 reps;Right Heel Slides: Right;10 reps Terminal Knee Extension: Right;10 reps Sidelying Hip ABduction: Limitations Hip ABduction Limitations: hold Prone  Hamstring Curl: 10 reps;Limitations Hamstring Curl Limitations: 2# Hip Extension: 10 reps      Physical Therapy Assessment and Plan PT Assessment and Plan Clinical Impression Statement: Began 4" step up/lateral step up completed correctly with weak eccentric control noted.  Pt stated pain increase to 8/10, able to reduce following ice at end of session to limit pain and swelling. PT Plan: Continue with current PT POC, begin gentle PROM next session.    Goals    Problem  List Patient Active Problem List  Diagnoses  . OSTEOARTHROSIS, LOCAL, PRIMARY, LOWER LEG  . Osteoarth NOS-L/Leg  . DERANGEMENT MENISCUS  . LOOSE BODY-KNEE  . SHOULDER PAIN  . KNEE PAIN  . HERNIATED LUMBOSACRAL DISC  . IMPINGEMENT SYNDROME  . TENOSYNOVITIS OF FOOT AND ANKLE  . UNSPECIFIED NEURALGIA NEURITIS AND RADICULITIS  . STRESS FRACTURE, TIBIA  . SUBLUXATION-RADIAL HEAD  . TEAR MEDIAL MENISCUS  . TOTAL KNEE FOLLOW-UP  . OTHER POSTSURGICAL STATUS OTHER  . Mononeuritis of leg  . Chronic pain  . OA (osteoarthritis) of knee  . Knee pain  . S/P total knee replacement    PT - End of Session Activity Tolerance: Patient tolerated treatment well General Behavior During Session: [facility] for tasks performed Cognition: [facility] for tasks performed  Juel Burrow 07/12/2011, 5:31 PM

## 2011-07-15 ENCOUNTER — Ambulatory Visit (HOSPITAL_COMMUNITY)
Admission: RE | Admit: 2011-07-15 | Discharge: 2011-07-15 | Disposition: A | Discharge status: Home / Self Care | Payer: Medicare Other | Source: Ambulatory Visit | Attending: Orthopedic Surgery | Admitting: Orthopedic Surgery

## 2011-07-15 NOTE — Progress Notes (Signed)
Physical Therapy Treatment Patient Details  Name: Sharon [name] MRN: [id number] Date of Birth: 07-Sep-1955  Today's Date: 07/15/2011 Time: 4401-0272 Time Calculation (min): 57 min Visit#: 5  of 12   Re-eval: 08/07/11 Charges:  therex 35', ice 10'  Treatment session began by Seth Bake, PTA    Subjective: Symptoms/Limitations Symptoms: This morning my pain is about a 5/10. Pain Assessment Currently in Pain?: Yes Pain Score:   5 Pain Location: Knee Pain Orientation: Right   Exercise/Treatments Aerobic Stationary Bike: 6' @ 2.0 full revolution Standing Heel Raises: 2 sets;10 reps;Limitations Heel Raises Limitations: toe raises 2x 10 Knee Flexion: 15 reps Lateral Step Up: 15 reps;Right;Step Height: 4" Forward Step Up: 15 reps;Right;Step Height: 4" Functional Squat: 10 reps Rocker Board: 2 minutes Rocker Board Limitations: R/L x 2'; A/P x 1" Seated Long Arc Quad: Right;15 reps;Weights Long Arc Quad Weight: 3 lbs. Supine Quad Sets: 15 reps;Limitations Quad Sets Limitations: 5" hold, prior to PROM Heel Slides: Right;10 reps;Limitations Heel Slides Limitations: prior to PROM Knee Extension: PROM Knee Flexion: PROM Sidelying Hip ABduction Limitations: hold Prone  Hamstring Curl: 15 reps Hamstring Curl Limitations: 3# Hip Extension: 15 reps   Modalities Modalities: Cryotherapy Cryotherapy Number Minutes Cryotherapy: 10 Minutes Cryotherapy Location: Knee (Right) Type of Cryotherapy: Ice pack  Physical Therapy Assessment and Plan PT Assessment and Plan Clinical Impression Statement: Able to increase weights/reps today without difficulty; continue to hold Side lying hip abduction secondary to flaring up pts. bursitits.  Also added gentle PROM for ext/flexion with good tolerance/results. PT Plan: Continue POC.     Problem List Patient Active Problem List  Diagnoses  . OSTEOARTHROSIS, LOCAL, PRIMARY, LOWER LEG  . Osteoarth NOS-L/Leg  . DERANGEMENT  MENISCUS  . LOOSE BODY-KNEE  . SHOULDER PAIN  . KNEE PAIN  . HERNIATED LUMBOSACRAL DISC  . IMPINGEMENT SYNDROME  . TENOSYNOVITIS OF FOOT AND ANKLE  . UNSPECIFIED NEURALGIA NEURITIS AND RADICULITIS  . STRESS FRACTURE, TIBIA  . SUBLUXATION-RADIAL HEAD  . TEAR MEDIAL MENISCUS  . TOTAL KNEE FOLLOW-UP  . OTHER POSTSURGICAL STATUS OTHER  . Mononeuritis of leg  . Chronic pain  . OA (osteoarthritis) of knee  . Knee pain  . S/P total knee replacement    PT - End of Session Activity Tolerance: Patient tolerated treatment well General Behavior During Session: [facility] for tasks performed Cognition: [facility] for tasks performed  Tristy Udovich B. Bascom Levels, PTA 07/15/2011, 9:35 AM

## 2011-07-17 ENCOUNTER — Ambulatory Visit (HOSPITAL_COMMUNITY)
Admission: RE | Admit: 2011-07-17 | Discharge: 2011-07-17 | Disposition: A | Discharge status: Home / Self Care | Payer: Medicare Other | Source: Ambulatory Visit | Attending: Orthopedic Surgery | Admitting: Orthopedic Surgery

## 2011-07-17 NOTE — Progress Notes (Signed)
Physical Therapy Treatment Patient Details  Name: AISSA LISOWSKI MRN: [id number] Date of Birth: 09-Jun-1955  Today's Date: 07/17/2011 Time: 4098-1191 Time Calculation (min): 69 min Visit#: 6  of 12   Re-eval: 07/10/11   Subjective: Symptoms/Limitations Symptoms: No real pain until I walked in with a cane now my pain is at a four. Pain Assessment Pain Score:   4 Pain Location: Knee Pain Orientation: Right;Lateral;Lower  Exercise/Treatments     Machine Exercises Stationary Bike:  (6'@2 .5 seat 8)   Aerobic Stationary Bike:  (6'@2 .5 seat 8)  Standing Heel Raises: 15 reps Knee Flexion: 15 reps;Limitations Knee Flexion Limitations: 3# Lateral Step Up: 15 reps Forward Step Up: 15 reps Step Down: 10 reps Functional Squat: 15 reps Rocker Board: 2 minutes;Limitations Rocker Board Limitations: one hand hold Seated Long Arc Quad: Strengthening;15 reps;Weights Long Arc Quad Weight: 3 lbs. Other Seated Knee Exercises: heelslide/ext x 10 Supine Quad Sets: Right;15 reps Short Arc Quad Sets: Right;15 reps;Limitations Short Arc Quad Sets Limitations: 3# Heel Slides: Right;10 reps Knee Extension: PROM Knee Flexion: PROM  Modalities Modalities: Cryotherapy Cryotherapy Number Minutes Cryotherapy: 10 Minutes Cryotherapy Location: Knee Type of Cryotherapy: Ice pack  Physical Therapy Assessment and Plan PT Assessment and Plan Clinical Impression Statement: added step downs, sitting knee flex and ext, and weight to program.  ROM and strength imporoving.  Pt ambulated in with cane today    Goals    Problem List Patient Active Problem List  Diagnoses  . OSTEOARTHROSIS, LOCAL, PRIMARY, LOWER LEG  . Osteoarth NOS-L/Leg  . DERANGEMENT MENISCUS  . LOOSE BODY-KNEE  . SHOULDER PAIN  . KNEE PAIN  . HERNIATED LUMBOSACRAL DISC  . IMPINGEMENT SYNDROME  . TENOSYNOVITIS OF FOOT AND ANKLE  . UNSPECIFIED NEURALGIA NEURITIS AND RADICULITIS  . STRESS FRACTURE, TIBIA  .  SUBLUXATION-RADIAL HEAD  . TEAR MEDIAL MENISCUS  . TOTAL KNEE FOLLOW-UP  . OTHER POSTSURGICAL STATUS OTHER  . Mononeuritis of leg  . Chronic pain  . OA (osteoarthritis) of knee  . Knee pain  . S/P total knee replacement    PT - End of Session Activity Tolerance: Patient tolerated treatment well General Behavior During Session: [facility] for tasks performed Cognition: [facility] for tasks performed  GP No functional reporting required  Esme Freund,CINDY 07/17/2011, 12:05 PM

## 2011-07-18 ENCOUNTER — Telehealth: Payer: Self-pay | Admitting: Orthopedic Surgery

## 2011-07-18 ENCOUNTER — Ambulatory Visit (HOSPITAL_COMMUNITY)
Admission: RE | Admit: 2011-07-18 | Discharge: 2011-07-18 | Disposition: A | Discharge status: Home / Self Care | Payer: Medicare Other | Source: Ambulatory Visit | Attending: Family Medicine | Admitting: Family Medicine

## 2011-07-18 NOTE — Telephone Encounter (Signed)
[name] asked for you to call her at [phone].  She will be at therapy 11:00-12:00

## 2011-07-18 NOTE — Progress Notes (Signed)
Physical Therapy Treatment Patient Details  Name: TAJA PENTLAND MRN: [id number] Date of Birth: 27-May-1956  Today's Date: 07/18/2011 Time: 1478-2956 Time Calculation (min): 82 min Visit#: 7  of 12   Re-eval: 08/07/11  Charge: therex 66 min  Ice 10 min  Subjective: Symptoms/Limitations Symptoms: Really sore today, had a good workout last session I always have increased pain with back to back PT sessions.  Pain scale 7/10 R knee. Pain Assessment Currently in Pain?: Yes Pain Score:   7 Pain Location: Knee Pain Orientation: Right;Lateral;Lower Pain Type: Surgical pain Pain Frequency: Constant  Objective:   Exercise/Treatments Aerobic Stationary Bike: 6' @ 2.0 full revolution Standing Heel Raises: 15 reps Knee Flexion: Limitations;2 sets;10 reps Knee Flexion Limitations: 3# Lateral Step Up: 2 sets;10 reps;Hand Hold: 1;Step Height: 4" Forward Step Up: 2 sets;10 reps;Hand Hold: 1;Step Height: 4" Step Down: 10 reps;Hand Hold: 1;Step Height: 4" Functional Squat: 2 sets;10 reps Rocker Board: 2 minutes;Limitations Rocker Board Limitations: R/L; A/P one HHA Seated Long Arc Quad: Strengthening;2 sets;10 reps;Weights Long Arc Quad Weight: 3 lbs. Other Seated Knee Exercises: heelslide/ext x 15 Supine Quad Sets: Right;15 reps;Limitations Quad Sets Limitations: 5" hold Heel Slides: Right;10 reps Heel Slides Limitations: prior to PROM Terminal Knee Extension: Right;10 reps;Limitations Terminal Knee Extension Limitations: 5" holds with towel under knee Knee Extension: PROM Knee Flexion: PROM Sidelying Hip ABduction: Limitations Hip ABduction Limitations: hold Prone  Hamstring Curl: 15 reps Hamstring Curl Limitations: 3# Hip Extension: 15 reps Other Prone Exercises: PROM with distraction 3x 30"   Modalities Modalities: Cryotherapy Cryotherapy Number Minutes Cryotherapy: 10 Minutes Cryotherapy Location: Knee Type of Cryotherapy: Ice pack  Physical Therapy Assessment  and Plan PT Assessment and Plan Clinical Impression Statement: Pt with increased tenderness with PROM this session, reduced reps secondary to pain increase.  Pt tolerated well towards total treatment with good ROM noted with PROM prone with distraction. PT Plan: Continue progressing strength and ROM.    Goals    Problem List Patient Active Problem List  Diagnoses  . OSTEOARTHROSIS, LOCAL, PRIMARY, LOWER LEG  . Osteoarth NOS-L/Leg  . DERANGEMENT MENISCUS  . LOOSE BODY-KNEE  . SHOULDER PAIN  . KNEE PAIN  . HERNIATED LUMBOSACRAL DISC  . IMPINGEMENT SYNDROME  . TENOSYNOVITIS OF FOOT AND ANKLE  . UNSPECIFIED NEURALGIA NEURITIS AND RADICULITIS  . STRESS FRACTURE, TIBIA  . SUBLUXATION-RADIAL HEAD  . TEAR MEDIAL MENISCUS  . TOTAL KNEE FOLLOW-UP  . OTHER POSTSURGICAL STATUS OTHER  . Mononeuritis of leg  . Chronic pain  . OA (osteoarthritis) of knee  . Knee pain  . S/P total knee replacement    PT - End of Session Activity Tolerance: Patient tolerated treatment well General Behavior During Session: [facility] for tasks performed Cognition: [facility] for tasks performed  Juel Burrow, PTA 07/18/2011, 12:16 PM

## 2011-07-18 NOTE — Telephone Encounter (Signed)
Aspirin for 6 weeks after surgery   Refill percocet to last until next appt

## 2011-07-22 ENCOUNTER — Ambulatory Visit (HOSPITAL_COMMUNITY)
Admission: RE | Admit: 2011-07-22 | Discharge: 2011-07-22 | Disposition: A | Discharge status: Home / Self Care | Payer: Medicare Other | Source: Ambulatory Visit | Attending: Orthopedic Surgery | Admitting: Orthopedic Surgery

## 2011-07-22 NOTE — Progress Notes (Signed)
Physical Therapy Treatment Patient Details  Name: Sharon [name] MRN: [id number] Date of Birth: [birth year]  Today's Date: 07/22/2011 Time: 8657-8469 Time Calculation (min): 60 min Visit#: 8  of 12   Re-eval: 08/07/11    Subjective: Symptoms/Limitations Symptoms: My knee is doing pretty good more soreness than pain. Pain Assessment Currently in Pain?: Yes Pain Score:   2 Pain Location: Knee Pain Orientation: Right Pain Type: Surgical pain Pain Onset: 1 to 4 weeks ago Pain Frequency: Constant  Exercise/Treatments   Aerobic Stationary Bike: 7' at 2.5 seat 7 Standing Rocker board x 2' Gastroc stretch x 3 x 15 sec.   knee flexion Supine Heel Slides: 10 reps Knee Extension: PROM Other Supine Knee Exercises: Pattellar mobs/   scar massage to knee Prone  Hamstring Curl: 15 reps Hamstring Curl Limitations: 3# Hip Extension: Right;15 reps;Limitations Hip Extension Limitations: 3# Other Prone Exercises: PROM   Modalities Modalities: Cryotherapy Manual Therapy Manual Therapy: Massage Massage: retro massage to decrease swelling;  Cryotherapy Number Minutes Cryotherapy: 10 Minutes Cryotherapy Location: Knee Type of Cryotherapy: Ice pack  Physical Therapy Assessment and Plan PT Assessment and Plan Clinical Impression Statement: Pt knee warmer than normal most likely due to increased activity over the weekend.   Rehab Potential: Good PT Plan: begin SLS and walking without cane next treatment.    Goals    Problem List Patient Active Problem List  Diagnoses  . OSTEOARTHROSIS, LOCAL, PRIMARY, LOWER LEG  . Osteoarth NOS-L/Leg  . DERANGEMENT MENISCUS  . LOOSE BODY-KNEE  . SHOULDER PAIN  . KNEE PAIN  . HERNIATED LUMBOSACRAL DISC  . IMPINGEMENT SYNDROME  . TENOSYNOVITIS OF FOOT AND ANKLE  . UNSPECIFIED NEURALGIA NEURITIS AND RADICULITIS  . STRESS FRACTURE, TIBIA  . SUBLUXATION-RADIAL HEAD  . TEAR MEDIAL MENISCUS  . TOTAL KNEE FOLLOW-UP  . OTHER POSTSURGICAL  STATUS OTHER  . Mononeuritis of leg  . Chronic pain  . OA (osteoarthritis) of knee  . Knee pain  . S/P total knee replacement    PT - End of Session Activity Tolerance: Patient tolerated treatment well General Behavior During Session: [facility] for tasks performed Cognition: [facility] for tasks performed  GP No functional reporting required  Gustavus Haskin,CINDY 07/22/2011, 12:04 PM

## 2011-07-22 NOTE — Telephone Encounter (Signed)
Jamie,do you know if this has been done?   Thanks, Steward Drone

## 2011-07-22 NOTE — Patient Instructions (Signed)
Ice more for today and tomorrow

## 2011-07-23 ENCOUNTER — Other Ambulatory Visit: Payer: Self-pay | Admitting: *Deleted

## 2011-07-23 ENCOUNTER — Other Ambulatory Visit: Payer: Self-pay | Admitting: Orthopedic Surgery

## 2011-07-23 DIAGNOSIS — Z96659 Presence of unspecified artificial knee joint: Secondary | ICD-10-CM

## 2011-07-23 MED ORDER — OXYCODONE-ACETAMINOPHEN 5-325 MG PO TABS: 1.0000 | ORAL_TABLET | ORAL | Status: DC | PRN

## 2011-07-23 MED ORDER — OXYCODONE-ACETAMINOPHEN 5-325 MG PO TABS: 1.0000 | ORAL_TABLET | ORAL | Status: AC | PRN

## 2011-07-23 NOTE — Telephone Encounter (Signed)
SCRIPT AVAILABLE FOR PICKUP AND PATIENT AWARE

## 2011-07-23 NOTE — Telephone Encounter (Signed)
Nurse handled

## 2011-07-24 ENCOUNTER — Ambulatory Visit (HOSPITAL_COMMUNITY): Payer: Medicare Other | Admitting: *Deleted

## 2011-07-24 ENCOUNTER — Telehealth (HOSPITAL_COMMUNITY): Payer: Self-pay

## 2011-07-25 NOTE — Progress Notes (Signed)
Error

## 2011-07-26 ENCOUNTER — Ambulatory Visit (HOSPITAL_COMMUNITY)
Admission: RE | Admit: 2011-07-26 | Discharge: 2011-07-26 | Disposition: A | Discharge status: Home / Self Care | Payer: Medicare Other | Source: Ambulatory Visit | Attending: Family Medicine | Admitting: Family Medicine

## 2011-07-26 DIAGNOSIS — IMO0001 Reserved for inherently not codable concepts without codable children: Secondary | ICD-10-CM | POA: Insufficient documentation

## 2011-07-26 DIAGNOSIS — M25469 Effusion, unspecified knee: Secondary | ICD-10-CM | POA: Insufficient documentation

## 2011-07-26 DIAGNOSIS — M25569 Pain in unspecified knee: Secondary | ICD-10-CM | POA: Insufficient documentation

## 2011-07-26 DIAGNOSIS — M6281 Muscle weakness (generalized): Secondary | ICD-10-CM | POA: Insufficient documentation

## 2011-07-26 NOTE — Progress Notes (Signed)
Physical Therapy Treatment Patient Details  Name: Sharon [name] MRN: [id number] Date of Birth: 02/04/1956  Today's Date: 07/26/2011 Time: 7829-5621 Time Calculation (min): 48 min Visit#: 9  of 13   Re-eval:      Subjective: Symptoms/Limitations Symptoms: I went two days without pain meds.  I could not get in touch with the MD. Pain Assessment Currently in Pain?: Yes Pain Score:   7 Pain Location: Knee Pain Orientation: Right   Exercise/Treatments      Aerobic Stationary Bike: 8' at 2.5   Seated Long Arc Quad: Strengthening;15 reps;Weights Long Arc Quad Weight: 3 lbs. Other Seated Knee Exercises: heelslide/extensionx 15 Supine Quad Sets: 15 reps Short Arc Quad Sets: Strengthening;15 reps;Limitations Short Arc Quad Sets Limitations: 5# Sidelying Hip ABduction: Strengthening;Right;10 reps Hip ABduction Limitations:  (3#) Prone  Hamstring Curl: 15 reps;Limitations Hamstring Curl Limitations: 3# Hip Extension: Strengthening;Right;15 reps;Limitations Hip Extension Limitations: 3# Other Prone Exercises:  (PROM)   Manual Therapy Manual Therapy: Massage Massage: retro for swelling  Physical Therapy Assessment and Plan PT Assessment and Plan Clinical Impression Statement: Pt LE does not have the warmth it had earlier in the week.  ROM improving Rehab Potential: Good PT Treatment/Interventions: Gait training;Therapeutic activities;Therapeutic exercise PT Plan: begin Gastroc stretch; lateral/forward step up, minisquat next treatment.    Goals    Problem List Patient Active Problem List  Diagnoses  . OSTEOARTHROSIS, LOCAL, PRIMARY, LOWER LEG  . Osteoarth NOS-L/Leg  . DERANGEMENT MENISCUS  . LOOSE BODY-KNEE  . SHOULDER PAIN  . KNEE PAIN  . HERNIATED LUMBOSACRAL DISC  . IMPINGEMENT SYNDROME  . TENOSYNOVITIS OF FOOT AND ANKLE  . UNSPECIFIED NEURALGIA NEURITIS AND RADICULITIS  . STRESS FRACTURE, TIBIA  . SUBLUXATION-RADIAL HEAD  . TEAR MEDIAL MENISCUS  .  TOTAL KNEE FOLLOW-UP  . OTHER POSTSURGICAL STATUS OTHER  . Mononeuritis of leg  . Chronic pain  . OA (osteoarthritis) of knee  . Knee pain  . S/P total knee replacement    PT - End of Session Activity Tolerance: Patient tolerated treatment well General Behavior During Session: [facility] for tasks performed Cognition: [facility] for tasks performed  GP No functional reporting required  Tamyia Minich,CINDY 07/26/2011, 4:34 PM

## 2011-07-29 ENCOUNTER — Ambulatory Visit (HOSPITAL_COMMUNITY)
Admission: RE | Admit: 2011-07-29 | Discharge: 2011-07-29 | Disposition: A | Discharge status: Home / Self Care | Payer: Medicare Other | Source: Ambulatory Visit | Attending: Family Medicine | Admitting: Family Medicine

## 2011-07-29 NOTE — Evaluation (Signed)
Physical Therapy Evaluation  Patient Details  Name: Sharon [name] MRN: [id number] Date of Birth: Sep 03, 1955  Today's Date: 07/29/2011 Time: 1050-1203 Time Calculation (min): 73 min  Visit#: 10  of 16   Re-eval: 08/12/11 Assessment Diagnosis: Right TKR Surgical Date: 06/17/11 Next MD Visit: 07/31/11 Prior Therapy: home health  Past Medical History:  Past Medical History  Diagnosis Date  . GERD (gastroesophageal reflux disease)   . Pneumonia   . Migraine   . Melanoma   . Carpal tunnel syndrome     bilateral  . Arthritis   . Degenerative disk disease     mainly back   Past Surgical History:  Past Surgical History  Procedure Date  . Appendectomy   . Tonsillectomy   . Vesicovaginal fistula closure w/ tah   . Total knee arthroplasty 2008    left   . Total knee revision 2.12.2012    left  . Menisus repair on rt knee     bilateral  . Abdominal hysterectomy   . Tubal ligation   . Breast biopsy     right  . Total knee arthroplasty 05/27/2011    Procedure: TOTAL KNEE ARTHROPLASTY;  Surgeon: Fuller Canada, MD;  Location: [facility];  Service: Orthopedics;  Laterality: Right;  DePuy  . Total knee arthroplasty 06/17/2011    Procedure: TOTAL KNEE ARTHROPLASTY;  Surgeon: Fuller Canada, MD;  Location: [facility];  Service: Orthopedics;  Laterality: Right;  DePuy    Subjective Symptoms/Limitations Symptoms: Pt states that she has no pain just soreness How long can you sit comfortably?: Pt is able to sit for 45 minutes now was 10-15 minutes. How long can you stand comfortably?: The patient is able to stand for over a 40 minutes now where she was able to stand for 20 minutes. How long can you walk comfortably?: At home the patient is walking without her cane.  Uses the cane outside her home. Special Tests: The patient is now sleeping all night she was waking up three times a night. Pain Assessment Currently in Pain?:  (pain goes up to a 9/10 without meds still)  Prior  Functioning  Home Living Lives With: Daughter Receives Help From: Family Type of Home: House Home Layout: One level Home Access: Stairs to enter Entrance Stairs-Rails: None Entrance Stairs-Number of Steps:  (3 going up sideways.) Home Adaptive Equipment: Bedside commode/3-in-1  Cognition/Observation Cognition Arousal/Alertness: Awake/alert Orientation Level: Oriented X4  Assessment RLE PROM (degrees) Right Knee Extension 0-130: 8  (was 12) Right Knee Flexion 0-140: 115  (was 90) RLE Strength Right Hip Flexion: 5/5 (was 4/5) Right Hip Extension:  (4+/5 was 4/5) Right Hip ABduction: 4/5 (was 3/5) Right Hip ADduction: 5/5 (was 3+/5) Right Knee Flexion: 5/5 (was 3/5) Right Knee Extension: 5/5 (was 3/5.) Right Ankle Dorsiflexion: 4/5  Exercise/Treatments Mobility/Balance  Transfers Sit to Stand: 7: Independent Stand to Sit: 7: Independent Static Standing Balance Single Leg Stance - Right Leg: 10  Single Leg Stance - Left Leg: 21    Aerobic Stationary Bike: 8' at 2.5   Standing Lateral Step Up: 15 reps Forward Step Up: 15 reps Step Down: 10 reps Rocker Board: 2 minutes Seated Long Arc Quad: Strengthening;15 reps;Weights Long Arc Quad Weight: 5 lbs. Supine Quad Sets: 15 reps Short Arc Quad Sets: Strengthening;15 reps;Limitations Water quality scientist Limitations: 5# Heel Slides: 15 reps Terminal Knee Extension: 15 reps Knee Extension: Right;15 reps Sidelying Hip ABduction: Strengthening;Right;10 reps Hip ABduction Limitations:  (3#) Prone  Hamstring Curl: 15 reps;Limitations  Hamstring Curl Limitations: 5# Hip Extension: Strengthening;Right;15 reps;Limitations Hip Extension Limitations: 3# Other Prone Exercises:  (PROM)   Physical Therapy Assessment and Plan PT Assessment and Plan Clinical Impression Statement: Pt ROM improving still lacking 8 from extension and needs 5 more degrees of flexion Rehab Potential: Good Clinical Impairments Affecting Rehab  Potential: stiffness; balance; pain PT Frequency: Min 3X/week PT Duration:  (2 wks) PT Treatment/Interventions: Gait training;Balance training;Therapeutic exercise PT Plan: D/C strengthening ex; work on balance and ROM;   Begin chair knee lunge; prone knee hang; SLS; tandem stance; tandem gait.       Goals Home Exercise Program PT Goal: Perform Home Exercise Program - Progress: Met PT Short Term Goals PT Short Term Goal 1: Pt ROM is at 115 but is only comfortable sitting for 45 min.  Goal was an hour and 1/2 PT Short Term Goal 1 - Progress: Progressing toward goal PT Short Term Goal 2 - Progress: Met PT Short Term Goal 3 - Progress: Met PT Long Term Goals PT Long Term Goal 1: ROM is at 115 pt is not yet able to return from a squatted position.  Goal ROM 120 able to squat with proper body mechanics. PT Long Term Goal 1 - Progress: Progressing toward goal PT Long Term Goal 2 - Progress: Not met Long Term Goal 3 Progress: Met Long Term Goal 4: New goal as of 07/29/11- balance to be = both legs to allow pt to feel secure ambulating outside without a cane.  Problem List Patient Active Problem List  Diagnoses  . OSTEOARTHROSIS, LOCAL, PRIMARY, LOWER LEG  . Osteoarth NOS-L/Leg  . DERANGEMENT MENISCUS  . LOOSE BODY-KNEE  . SHOULDER PAIN  . KNEE PAIN  . HERNIATED LUMBOSACRAL DISC  . IMPINGEMENT SYNDROME  . TENOSYNOVITIS OF FOOT AND ANKLE  . UNSPECIFIED NEURALGIA NEURITIS AND RADICULITIS  . STRESS FRACTURE, TIBIA  . SUBLUXATION-RADIAL HEAD  . TEAR MEDIAL MENISCUS  . TOTAL KNEE FOLLOW-UP  . OTHER POSTSURGICAL STATUS OTHER  . Mononeuritis of leg  . Chronic pain  . OA (osteoarthritis) of knee  . Knee pain  . S/P total knee replacement    PT - End of Session Activity Tolerance: Patient tolerated treatment well General Behavior During Session: [facility] for tasks performed Cognition: [facility] for tasks performed PT Plan of Care Consulted and Agree with Plan of Care:  Patient    Braelyn Bordonaro,CINDY 07/29/2011, 12:04 PM  Physician Documentation Your signature is required to indicate approval of the treatment plan as stated above.  Please sign and either send electronically or make a copy of this report for your files and return this physician signed original.   Please mark one 1.__approve of plan  2. ___approve of plan with the following conditions.   ______________________________                                                          _____________________ Physician Signature  Date  

## 2011-07-31 ENCOUNTER — Ambulatory Visit (HOSPITAL_COMMUNITY)
Admission: RE | Admit: 2011-07-31 | Discharge: 2011-07-31 | Disposition: A | Discharge status: Home / Self Care | Payer: Medicare Other | Source: Ambulatory Visit | Attending: Family Medicine | Admitting: Family Medicine

## 2011-07-31 ENCOUNTER — Ambulatory Visit (INDEPENDENT_AMBULATORY_CARE_PROVIDER_SITE_OTHER): Payer: Medicare Other | Admitting: Orthopedic Surgery

## 2011-07-31 VITALS — BP 110/72 | Ht 60.0 in | Wt 230.0 lb

## 2011-07-31 DIAGNOSIS — Z96659 Presence of unspecified artificial knee joint: Secondary | ICD-10-CM

## 2011-07-31 MED ORDER — HYDROCODONE-ACETAMINOPHEN 10-325 MG PO TABS: 1.0000 | ORAL_TABLET | ORAL | Status: AC | PRN

## 2011-07-31 NOTE — Patient Instructions (Signed)
Therapy 2 more weeks

## 2011-07-31 NOTE — Progress Notes (Signed)
Physical Therapy Treatment Patient Details  Name: Sharon [name] MRN: [id number] Date of Birth: 1956-05-05  Today's Date: 07/31/2011 Time: 4098-1191 Time Calculation (min): 66 min Visit#: 11  of 16   Re-eval: 08/12/11 Charges:  Therex 35', manual 15'    Subjective: Symptoms/Limitations Symptoms: Pt reported MD happy with progress, going to continue for 2 more weeks.  Min pain maybe a 1/10. Pain Assessment Currently in Pain?: Yes Pain Score:   1 Pain Location: Knee Pain Orientation: Right   Exercise/Treatments Stretches Quad Stretch: 3 reps;30 seconds Aerobic Stationary Bike: 8' at 2.5 seat 8 Standing SLS: max of 4 20" Other Standing Knee Exercises: tandem and retro tandem gait 1RT each Supine Quad Sets: 10 reps;Limitations Quad Sets Limitations: prior to PROM Heel Slides: 10 reps;Limitations Heel Slides Limitations: prior to PROM Knee Extension: PROM Knee Flexion: PROM Prone  Prone Knee Hang: 5 minutes Other Prone Exercises: quad stretch with rope 3X30"   Manual Therapy Massage: Retro massage to decrease swelling Myofascial Release: MFR to decrease adhesions  Physical Therapy Assessment and Plan PT Assessment and Plan Clinical Impression Statement: Added new stretches and balance activities without difficulty.  Pt. with palpable adhesions perimeter of knee that decreased with MFR. PT Plan: Continue to progress ROM and balance.     Problem List Patient Active Problem List  Diagnoses  . OSTEOARTHROSIS, LOCAL, PRIMARY, LOWER LEG  . Osteoarth NOS-L/Leg  . DERANGEMENT MENISCUS  . LOOSE BODY-KNEE  . SHOULDER PAIN  . KNEE PAIN  . HERNIATED LUMBOSACRAL DISC  . IMPINGEMENT SYNDROME  . TENOSYNOVITIS OF FOOT AND ANKLE  . UNSPECIFIED NEURALGIA NEURITIS AND RADICULITIS  . STRESS FRACTURE, TIBIA  . SUBLUXATION-RADIAL HEAD  . TEAR MEDIAL MENISCUS  . TOTAL KNEE FOLLOW-UP  . OTHER POSTSURGICAL STATUS OTHER  . Mononeuritis of leg  . Chronic pain  . OA  (osteoarthritis) of knee  . Knee pain  . S/P total knee replacement    PT - End of Session Activity Tolerance: Patient tolerated treatment well General Behavior During Session: [facility] for tasks performed Cognition: [facility] for tasks performed    Deaisha Welborn B. Bascom Levels, PTA 07/31/2011, 11:56 AM

## 2011-07-31 NOTE — Progress Notes (Signed)
Patient ID: Sharon [name], female   DOB: 1955-06-21, 56 y.o.   MRN: [id number] Chief Complaint  Patient presents with  . Follow-up    4 week recheck on right knee     6 week followup and total knee replacement RIGHT lower extremity doing well.  Requested change from Percocet to Vicodin.  Therapy is going well probably needs about 2 more weeks.Her knee incision looks good her knee is straight her flexion is past 90  She is using a cane  Follow up in 6 weeks.

## 2011-08-01 ENCOUNTER — Ambulatory Visit (HOSPITAL_COMMUNITY)
Admission: RE | Admit: 2011-08-01 | Discharge: 2011-08-01 | Disposition: A | Discharge status: Home / Self Care | Payer: Medicare Other | Source: Ambulatory Visit | Attending: Family Medicine | Admitting: Family Medicine

## 2011-08-01 NOTE — Progress Notes (Signed)
Physical Therapy Treatment Patient Details  Name: Sharon [name] MRN: [id number] Date of Birth: 15-May-1956  Today's Date: 08/01/2011 Time: 4098-1191 Time Calculation (min): 75 min Visit#: 12  of 16   Re-eval: 08/12/11 Charges:  therex 35', manual 20'    Subjective: Symptoms/Limitations Symptoms: Pt. states she is sore today posterior R LE (hamsting area and lateral thigh) but no real pain. Pain Assessment Currently in Pain?: No/denies   Exercise/Treatments Aerobic Stationary Bike: 10'@4 .0 seat 8 Standing SLS: max of 4 13" SLS with Vectors: 5X5"  Other Standing Knee Exercises: tandem and retro tandem gait 1RT each Other Standing Knee Exercises: chair stretch 3X30" Supine Quad Sets: 10 reps;Limitations Quad Sets Limitations: prior to PROM Heel Slides: 10 reps;Limitations Heel Slides Limitations: prior to PROM Knee Extension: PROM Knee Flexion: PROM Prone  Prone Knee Hang: 5 minutes Other Prone Exercises: quad stretch with rope 3X30"   Manual Therapy Manual Therapy: Myofascial release Massage: Retro massage to decrease swelling X 10 minutes Myofascial Release: MFR to decrease adhesions X 5'  Physical Therapy Assessment and Plan PT Assessment and Plan Clinical Impression Statement: Added vector stance on R LE to increase stability; no other new activities added secondary to soreness. PT Plan: Progress ROM and stability of R knee.     Problem List Patient Active Problem List  Diagnoses  . OSTEOARTHROSIS, LOCAL, PRIMARY, LOWER LEG  . Osteoarth NOS-L/Leg  . DERANGEMENT MENISCUS  . LOOSE BODY-KNEE  . SHOULDER PAIN  . KNEE PAIN  . HERNIATED LUMBOSACRAL DISC  . IMPINGEMENT SYNDROME  . TENOSYNOVITIS OF FOOT AND ANKLE  . UNSPECIFIED NEURALGIA NEURITIS AND RADICULITIS  . STRESS FRACTURE, TIBIA  . SUBLUXATION-RADIAL HEAD  . TEAR MEDIAL MENISCUS  . TOTAL KNEE FOLLOW-UP  . OTHER POSTSURGICAL STATUS OTHER  . Mononeuritis of leg  . Chronic pain  . OA  (osteoarthritis) of knee  . Knee pain  . S/P total knee replacement    PT - End of Session Equipment Utilized During Treatment: Gait belt Activity Tolerance: Patient tolerated treatment well General Behavior During Session: [facility] for tasks performed Cognition: [facility] for tasks performed    [name] B. Bascom Levels, PTA 08/01/2011, 12:15 PM

## 2011-08-05 ENCOUNTER — Ambulatory Visit (HOSPITAL_COMMUNITY)
Admission: RE | Admit: 2011-08-05 | Discharge: 2011-08-05 | Disposition: A | Discharge status: Home / Self Care | Payer: Medicare Other | Source: Ambulatory Visit | Attending: Family Medicine | Admitting: Family Medicine

## 2011-08-05 NOTE — Progress Notes (Signed)
Physical Therapy Treatment Patient Details  Name: Sharon [name] MRN: [id number] Date of Birth: [birth year]  Today's Date: 08/05/2011 Time: 4098-1191 Time Calculation (min): 65 min Visit#: 13  of 17   Re-eval: 08/12/11    Subjective: Symptoms/Limitations Symptoms: Pt states she walked over an hour over the weekend was tired but just a little sore not pain. Pain Assessment Currently in Pain?: No/denies    Exercise/Treatments    Stretches Passive Hamstring Stretch: 2 reps;30 seconds Aerobic Stationary Bike: 8'@ 3.0 Standing SLS: max of 15 sec R; 40 sec L Gait Training:  (tandem gait x 2 rt) Other Standing Knee Exercises: tandem stance  Other Standing Knee Exercises:  (chair stretch 30" x 3)   Supine Quad Sets: 10 reps Heel Slides: 15 reps Knee Extension: PROM Other Supine Knee Exercises: foot on therapist shld;bend in push out x 10   Prone  Prone Knee Hang: 5 minutes Other Prone Exercises:  (quad sretch ) Other Prone Exercises: contract relax to increase flexion  Manual Therapy Manual Therapy: Massage Massage: retromassage for swelling x 10'  Physical Therapy Assessment and Plan PT Assessment and Plan Clinical Impression Statement: Pt improving in balance.  continues to be sore but no pain. Rehab Potential: Good PT Frequency: Min 3X/week PT Plan: continue with POC    Goals    Problem List Patient Active Problem List  Diagnoses  . OSTEOARTHROSIS, LOCAL, PRIMARY, LOWER LEG  . Osteoarth NOS-L/Leg  . DERANGEMENT MENISCUS  . LOOSE BODY-KNEE  . SHOULDER PAIN  . KNEE PAIN  . HERNIATED LUMBOSACRAL DISC  . IMPINGEMENT SYNDROME  . TENOSYNOVITIS OF FOOT AND ANKLE  . UNSPECIFIED NEURALGIA NEURITIS AND RADICULITIS  . STRESS FRACTURE, TIBIA  . SUBLUXATION-RADIAL HEAD  . TEAR MEDIAL MENISCUS  . TOTAL KNEE FOLLOW-UP  . OTHER POSTSURGICAL STATUS OTHER  . Mononeuritis of leg  . Chronic pain  . OA (osteoarthritis) of knee  . Knee pain  . S/P total knee  replacement    PT - End of Session Equipment Utilized During Treatment: Gait belt Activity Tolerance: Patient tolerated treatment well General Behavior During Session: [facility] for tasks performed Cognition: [facility] for tasks performed PT Plan of Care Consulted and Agree with Plan of Care: Patient  GP No functional reporting required  RUSSELL,CINDY 08/05/2011, 12:08 PM

## 2011-08-07 ENCOUNTER — Telehealth (HOSPITAL_COMMUNITY): Payer: Self-pay | Admitting: Physical Therapy

## 2011-08-07 ENCOUNTER — Ambulatory Visit (HOSPITAL_COMMUNITY): Payer: Medicare Other | Admitting: Physical Therapy

## 2011-08-09 ENCOUNTER — Ambulatory Visit (HOSPITAL_COMMUNITY)
Admission: RE | Admit: 2011-08-09 | Discharge: 2011-08-09 | Disposition: A | Discharge status: Home / Self Care | Payer: Medicare Other | Source: Ambulatory Visit | Attending: Family Medicine | Admitting: Family Medicine

## 2011-08-09 NOTE — Progress Notes (Signed)
Physical Therapy Treatment Patient Details  Name: Sharon [name] MRN: [id number] Date of Birth: 15-Aug-1955  Today's Date: 08/09/2011 Time: 1004-1050 Time Calculation (min): 46 min Visit#: 14  of 17   Re-eval: 08/12/11  there ex x 1; gt x 1; neuro reed x 1  Subjective: Symptoms/Limitations Symptoms: Pt states she is walking in the house without her cane. Pain Assessment Currently in Pain?: No/denies  Exercise/Treatments   Stretches Passive Hamstring Stretch: 3 reps;30 seconds Aerobic Stationary Bike: 8' 3.5  Standing SLS: max of 30" x 3 Gait Training:  (outside on incline and in grass x 2) Other Standing Knee Exercises: tandem gait x 2 Other Standing Knee Exercises: retro gait x 2   Retro massage to LE  Physical Therapy Assessment and Plan PT Assessment and Plan Clinical Impression Statement: Pt did well with incline and uneven grass outside. Rehab Potential: Good    Goals    Problem List Patient Active Problem List  Diagnoses  . OSTEOARTHROSIS, LOCAL, PRIMARY, LOWER LEG  . Osteoarth NOS-L/Leg  . DERANGEMENT MENISCUS  . LOOSE BODY-KNEE  . SHOULDER PAIN  . KNEE PAIN  . HERNIATED LUMBOSACRAL DISC  . IMPINGEMENT SYNDROME  . TENOSYNOVITIS OF FOOT AND ANKLE  . UNSPECIFIED NEURALGIA NEURITIS AND RADICULITIS  . STRESS FRACTURE, TIBIA  . SUBLUXATION-RADIAL HEAD  . TEAR MEDIAL MENISCUS  . TOTAL KNEE FOLLOW-UP  . OTHER POSTSURGICAL STATUS OTHER  . Mononeuritis of leg  . Chronic pain  . OA (osteoarthritis) of knee  . Knee pain  . S/P total knee replacement    PT - End of Session Equipment Utilized During Treatment: Gait belt Activity Tolerance: Patient tolerated treatment well General Behavior During Session: [facility] for tasks performed Cognition: [facility] for tasks performed  GP No functional reporting required  Sharon [name],CINDY 08/09/2011, 10:56 AM

## 2011-08-12 ENCOUNTER — Ambulatory Visit (HOSPITAL_COMMUNITY)
Admission: RE | Admit: 2011-08-12 | Discharge: 2011-08-12 | Disposition: A | Discharge status: Home / Self Care | Payer: Medicare Other | Source: Ambulatory Visit | Attending: Family Medicine | Admitting: Family Medicine

## 2011-08-12 NOTE — Evaluation (Signed)
Physical Therapy reassessment Patient Details  Name: Sharon [name] MRN: [id number] Date of Birth: [birth year]  Today's Date: 08/12/2011 Time: 8295-6213 Time Calculation (min): 63 min  Visit#: 15  of 17   Re-eval: 08/12/11 Assessment Diagnosis: Right TKR Surgical Date: 06/17/11 Next MD Visit: 07/31/11 Prior Therapy: home health  Past Medical History:  Past Medical History  Diagnosis Date  . GERD (gastroesophageal reflux disease)   . Pneumonia   . Migraine   . Melanoma   . Carpal tunnel syndrome     bilateral  . Arthritis   . Degenerative disk disease     mainly back   Past Surgical History:  Past Surgical History  Procedure Date  . Appendectomy   . Tonsillectomy   . Vesicovaginal fistula closure w/ tah   . Total knee arthroplasty 2008    left   . Total knee revision 2.12.2012    left  . Menisus repair on rt knee     bilateral  . Abdominal hysterectomy   . Tubal ligation   . Breast biopsy     right  . Total knee arthroplasty 05/27/2011    Procedure: TOTAL KNEE ARTHROPLASTY;  Surgeon: Fuller Canada, MD;  Location: [facility];  Service: Orthopedics;  Laterality: Right;  DePuy  . Total knee arthroplasty 06/17/2011    Procedure: TOTAL KNEE ARTHROPLASTY;  Surgeon: Fuller Canada, MD;  Location: [facility];  Service: Orthopedics;  Laterality: Right;  DePuy    Subjective Symptoms/Limitations Symptoms: Pt states she went shopping with her granddaughter without her cane and it went well Pain Assessment Currently in Pain?:  (no, more soreness than pain.) Pain Location: Knee Pain Orientation: Right Pain Type: Surgical pain Pain Onset: 1 to 4 weeks ago   Prior Functioning  Home Living Lives With: Daughter Receives Help From: Family Type of Home: House Home Layout: One level Home Access: Stairs to enter Entrance Stairs-Rails: None Entrance Stairs-Number of Steps:  Able to go up reciprocal Home Adaptive Equipment: Bedside  commode/3-in-1  Cognition/Observation Cognition Arousal/Alertness: Awake/alert Orientation Level: Oriented X4  Assessment RLE PROM (degrees) Right Knee Extension:  (2 was 8) Right Knee Flexion:  (120 was 115) RLE Strength Right Hip Flexion: 5/5 (was 4/5) Right Hip Extension: 5/5 (was 4+/5) Right Hip ABduction: 4/5 (was 4/5) Right Hip ADduction: 5/5 (was 3+/5) Right Knee Flexion: 5/5 (was 3/5) Right Knee Extension: 5/5 (was 3/5.) Right Ankle Dorsiflexion: 4/5 (5-/5 was 4/5)  Exercise/Treatments Mobility/Balance  Transfers Sit to Stand: 7: Independent Stand to Sit: 7: Independent Static Standing Balance Single Leg Stance - Right Leg: 50 Single Leg Stance - Left Leg: 30    Stretches Passive Hamstring Stretch: 3 reps;30 seconds Aerobic Stationary Bike: 8' 3.5  Standing SLS: max of 30" x 3 (R 50 sec; L 30 sec) Walking with Sports Cord:  (steps reciprocal up and down one flight/) Gait Training:  (unable to go outside today due to rain incline downstair.  ) Other Standing Knee Exercises: proper squatting at table x 5   Supine Quad Sets: 10 reps Heel Slides: 10 reps Knee Extension: PROM Knee Flexion: PROM   Manual Therapy Manual Therapy: Joint mobilization Joint Mobilization: A/P grade II-III to tib/femeral; fibular- Massage: retromassage x 8'  Physical Therapy Assessment and Plan PT Assessment and Plan Clinical Impression Statement: Pt has improved with strength and ROM.   Rehab Potential: Good PT Plan: Pt to be seen two more visits than D/C to [facility].  Pt needs to work on functional activites such as squatting with  trunk upright, steps ramps and amb on grass for the next two sessions as well as stretcing.    Goals PT Short Term Goals PT Short Term Goal 1: Pt ROM is to 120.  Pt is able to sit for an hour was 45 min.  Goal  was for 11/2 hour but she has not had the opportunity yet. PT Short Term Goal 2: Pt is only using a cane if she is outside by herself otherwise  she goes without an assistive device. PT Short Term Goal 2 - Progress: Met PT Short Term Goal 3: The patient states more soreness than pain. PT Short Term Goal 3 - Progress: Met PT Long Term Goals PT Long Term Goal 1: Pt does not squat using good biomechanics due to habits made when her knee had severe DDD.  Working on improving bodymechanics with squatting. PT Long Term Goal 2: Pt is decreasing using pain meds.  Usually only needed at bedtime. PT Long Term Goal 2 - Progress: Progressing toward goal Long Term Goal 3 Progress: Met Long Term Goal 4 Progress: Progressing toward goal  Problem List Patient Active Problem List  Diagnoses  . OSTEOARTHROSIS, LOCAL, PRIMARY, LOWER LEG  . Osteoarth NOS-L/Leg  . DERANGEMENT MENISCUS  . LOOSE BODY-KNEE  . SHOULDER PAIN  . KNEE PAIN  . HERNIATED LUMBOSACRAL DISC  . IMPINGEMENT SYNDROME  . TENOSYNOVITIS OF FOOT AND ANKLE  . UNSPECIFIED NEURALGIA NEURITIS AND RADICULITIS  . STRESS FRACTURE, TIBIA  . SUBLUXATION-RADIAL HEAD  . TEAR MEDIAL MENISCUS  . TOTAL KNEE FOLLOW-UP  . OTHER POSTSURGICAL STATUS OTHER  . Mononeuritis of leg  . Chronic pain  . OA (osteoarthritis) of knee  . Knee pain  . S/P total knee replacement    PT - End of Session Activity Tolerance: Patient tolerated treatment well General Behavior During Session: [facility] for tasks performed Cognition: [facility] for tasks performed    Ashlen Kiger,CINDY 08/12/2011, 11:58 AM  Physician Documentation Your signature is required to indicate approval of the treatment plan as stated above.  Please sign and either send electronically or make a copy of this report for your files and return this physician signed original.   Please mark one 1.__approve of plan  2. ___approve of plan with the following conditions.   ______________________________                                                          _____________________ Physician Signature                                                                                                              Date

## 2011-08-14 ENCOUNTER — Ambulatory Visit (HOSPITAL_COMMUNITY): Payer: Medicare Other | Admitting: *Deleted

## 2011-08-14 ENCOUNTER — Telehealth (HOSPITAL_COMMUNITY): Payer: Self-pay

## 2011-08-16 ENCOUNTER — Ambulatory Visit (HOSPITAL_COMMUNITY): Payer: Medicare Other | Admitting: Physical Therapy

## 2011-08-16 ENCOUNTER — Telehealth (HOSPITAL_COMMUNITY): Payer: Self-pay | Admitting: Physical Therapy

## 2011-09-11 ENCOUNTER — Ambulatory Visit (INDEPENDENT_AMBULATORY_CARE_PROVIDER_SITE_OTHER): Payer: Medicare Other | Admitting: Orthopedic Surgery

## 2011-09-11 ENCOUNTER — Encounter: Payer: Self-pay | Admitting: Orthopedic Surgery

## 2011-09-11 VITALS — BP 110/72 | Ht 60.0 in | Wt 230.0 lb

## 2011-09-11 DIAGNOSIS — M7061 Trochanteric bursitis, right hip: Secondary | ICD-10-CM

## 2011-09-11 DIAGNOSIS — M76899 Other specified enthesopathies of unspecified lower limb, excluding foot: Secondary | ICD-10-CM

## 2011-09-11 DIAGNOSIS — M7062 Trochanteric bursitis, left hip: Secondary | ICD-10-CM | POA: Insufficient documentation

## 2011-09-11 NOTE — Progress Notes (Signed)
Patient ID: Sharon [name], female   DOB: December 13, 1955, 56 y.o.   MRN: [id number] Chief Complaint  Patient presents with  . Follow-up    6 week recheck right knee, DOS 06/17/11  . Hip Pain    bilateral hip pain    Status post RIGHT total knee doing well.  Complaint of bilateral hip pain with history of previous hip bursitis.  There is tenderness over both hips. Hip range of motion is normal. Hip joint stable. Muscle tone normal. Pulse and temperature normal. Sensation normal.   Hip Injection right and left hip  Verbal consent was obtained  Timeout procedure was completed for each hip  Alcohol was used as a prep with ethyl chloride as an anesthetizing agent  Using a 22-gauge spinal needle the greater trochanter bursa was entered and injected with Depo-Medrol 40 mg.  We also diluted out with 3 cc 1% plain lidocaine

## 2011-09-11 NOTE — Patient Instructions (Signed)
You have received a steroid shot. 15% of patients experience increased pain at the injection site with in the next 24 hours. This is best treated with ice and tylenol extra strength 2 tabs every 8 hours. If you are still having pain please call the office.    

## 2011-10-28 ENCOUNTER — Other Ambulatory Visit: Payer: Self-pay | Admitting: Orthopedic Surgery

## 2012-02-19 ENCOUNTER — Other Ambulatory Visit: Payer: Self-pay | Admitting: Orthopedic Surgery

## 2012-06-16 ENCOUNTER — Ambulatory Visit: Payer: Medicare Other | Admitting: Orthopedic Surgery

## 2012-06-25 ENCOUNTER — Ambulatory Visit (INDEPENDENT_AMBULATORY_CARE_PROVIDER_SITE_OTHER): Payer: Medicare Other

## 2012-06-25 ENCOUNTER — Encounter: Payer: Self-pay | Admitting: Orthopedic Surgery

## 2012-06-25 ENCOUNTER — Ambulatory Visit (INDEPENDENT_AMBULATORY_CARE_PROVIDER_SITE_OTHER): Payer: Medicare Other | Admitting: Orthopedic Surgery

## 2012-06-25 VITALS — BP 160/88 | Ht 60.0 in | Wt 230.0 lb

## 2012-06-25 DIAGNOSIS — Z96659 Presence of unspecified artificial knee joint: Secondary | ICD-10-CM

## 2012-06-25 DIAGNOSIS — M7071 Other bursitis of hip, right hip: Secondary | ICD-10-CM | POA: Insufficient documentation

## 2012-06-25 DIAGNOSIS — M76899 Other specified enthesopathies of unspecified lower limb, excluding foot: Secondary | ICD-10-CM

## 2012-06-25 DIAGNOSIS — M25559 Pain in unspecified hip: Secondary | ICD-10-CM

## 2012-06-25 NOTE — Patient Instructions (Signed)
You have received a steroid shot. 15% of patients experience increased pain at the injection site with in the next 24 hours. This is best treated with ice and tylenol extra strength 2 tabs every 8 hours. If you are still having pain please call the office.    

## 2012-06-25 NOTE — Progress Notes (Signed)
Patient ID: Sharon [name], female   DOB: [birth year], 57 y.o.   MRN: [id number] Chief Complaint  Patient presents with  . Follow-up    yearly recheck on bilateral knee replacements.  . Hip Pain    Pain over the right greater trochanteric bursa     History the patient had bilateral total knees the right one in January of 2013 the left one in be wary of 2012 she then had a revision of the left side for loose body removal.  She's doing well. She does note 2 falls and she has pain over the right greater trochanter which responded well to an injection last time  Review of systems currently no numbness or tingling just some generalized joint aching pain  History   Social History  . Marital Status: Divorced    Spouse Name: N/A    Number of Children: N/A  . Years of Education: N/A   Occupational History  . RN at [employer]     Social History Main Topics  . Smoking status: Former Smoker -- 1.0 packs/day for 20 years    Types: Cigarettes    Quit date: 05/27/2006  . Smokeless tobacco: Not on file  . Alcohol Use: Yes     Comment: 1 glass occassionally on holidays (about once a month)  . Drug Use: No  . Sexually Active: Yes    Birth Control/ Protection: Surgical   Other Topics Concern  . Not on file   Social History Narrative  . No narrative on file    Physical Exam(12)  Vital signs:   GENERAL: normal development   CDV: pulses are normal   Skin: normal  Lymph: nodes were not palpable/normal  Psychiatric: awake, alert and oriented  Neuro: normal sensation  MSK  Gait: She is ambulating with a waddling gait  1 Inspection the knees looked very good incisions look clean she is in concerned about some puffiness over the right knee which is nontender to palpation there appears to be no joint effusion she has some significant weight issues which may be clouding the picture in the right knee in terms of swelling 2 Range of Motion knee flexion is excellent 125 bilateral 3 Motor  normal bilaterally 4 Stability normal bilaterally  Other side:  5 right hip tenderness right greater trochanter no tenderness left greater trochanter 6 nontender in the lumbar spine  Imaging bilateral knee films of the total knees show implants stable  Assessment: Status post bilateral knee replacements stable                        Right greater trochanter bursitis     Plan: Inject right hip x-rays both knees and one year  Procedure injection right greater trochanter Verbal consent Timeout 40 mg of Depo-Medrol injected over the right greater trochanteric bursa with 3 cc 1% plain lidocaine with sterile technique no complications

## 2012-07-23 ENCOUNTER — Other Ambulatory Visit: Payer: Self-pay

## 2012-07-23 DIAGNOSIS — Z1231 Encounter for screening mammogram for malignant neoplasm of breast: Secondary | ICD-10-CM

## 2012-08-07 ENCOUNTER — Ambulatory Visit
Admission: RE | Admit: 2012-08-07 | Discharge: 2012-08-07 | Disposition: A | Discharge status: Home / Self Care | Payer: Medicare Other | Source: Ambulatory Visit

## 2012-08-07 DIAGNOSIS — Z1231 Encounter for screening mammogram for malignant neoplasm of breast: Secondary | ICD-10-CM

## 2012-08-11 ENCOUNTER — Other Ambulatory Visit: Payer: Self-pay | Admitting: Family Medicine

## 2012-08-11 DIAGNOSIS — R928 Other abnormal and inconclusive findings on diagnostic imaging of breast: Secondary | ICD-10-CM

## 2012-08-21 ENCOUNTER — Ambulatory Visit
Admission: RE | Admit: 2012-08-21 | Discharge: 2012-08-21 | Disposition: A | Discharge status: Home / Self Care | Payer: Medicare Other | Source: Ambulatory Visit | Attending: Family Medicine | Admitting: Family Medicine

## 2012-08-21 DIAGNOSIS — R928 Other abnormal and inconclusive findings on diagnostic imaging of breast: Secondary | ICD-10-CM

## 2012-09-08 ENCOUNTER — Other Ambulatory Visit: Payer: Self-pay | Admitting: Family Medicine

## 2012-10-06 ENCOUNTER — Ambulatory Visit (INDEPENDENT_AMBULATORY_CARE_PROVIDER_SITE_OTHER): Payer: Medicare Other | Admitting: Orthopedic Surgery

## 2012-10-06 ENCOUNTER — Encounter: Payer: Self-pay | Admitting: Orthopedic Surgery

## 2012-10-06 ENCOUNTER — Ambulatory Visit (INDEPENDENT_AMBULATORY_CARE_PROVIDER_SITE_OTHER): Payer: Medicare Other

## 2012-10-06 VITALS — BP 118/78 | Ht 60.0 in | Wt 230.0 lb

## 2012-10-06 DIAGNOSIS — M5410 Radiculopathy, site unspecified: Secondary | ICD-10-CM | POA: Insufficient documentation

## 2012-10-06 DIAGNOSIS — M25559 Pain in unspecified hip: Secondary | ICD-10-CM | POA: Insufficient documentation

## 2012-10-06 DIAGNOSIS — M5137 Other intervertebral disc degeneration, lumbosacral region: Secondary | ICD-10-CM

## 2012-10-06 DIAGNOSIS — M25551 Pain in right hip: Secondary | ICD-10-CM

## 2012-10-06 DIAGNOSIS — IMO0002 Reserved for concepts with insufficient information to code with codable children: Secondary | ICD-10-CM

## 2012-10-06 MED ORDER — HYDROCODONE-ACETAMINOPHEN 10-325 MG PO TABS: 1.0000 | ORAL_TABLET | Freq: Four times a day (QID) | ORAL | Status: DC | PRN

## 2012-10-06 NOTE — Progress Notes (Signed)
Patient ID: Sharon [name], female   DOB: 03-Jun-1955, 57 y.o.   MRN: [id number] Chief Complaint  Patient presents with  . Hip Pain    Right hip pain. Had injection on 06-25-12.    Injected for bursitis but no improvement H/O DDD L spine, last MRI 2011 showed right disc protrusion and root impingement at L5-S1   L4-5: Mild bilateral facet joint degenerative changes. Mild bulge  slightly greater right lateral position. No significant nerve root  compression.  L5-S1: Mild bilateral facet joint degenerative changes. Bulge  with small broad-based central slightly caudally extending disc  protrusion with minimal impression upon the upper aspect of S1  nerve roots slightly greater on the right. Left lateral disc  osteophyte complex with mild encroachment upon but not significant  compression of the exiting left L5 nerve root.  Prominent perineural cyst upper sacrum most notable on the left at  the S1-S2 level crowding the foramen.  The pain is in the right hip area and right buttock area the patient cannot roll over onto her right side it is causing her to walk with a limp. She's been treating with ice and ibuprofen for greater than 6 weeks. She's not having a bowel or bladder dysfunction at this time.  X-rays of the pelvis show normal hip joints and lumbar spine films show degenerative disc disease throughout the lumbar spine previous MRI noted above  At this point we will continue his Norco 10 mg and order MRI to prepare for epidural steroid injection probably at L5-S1 on the right

## 2012-10-06 NOTE — Patient Instructions (Signed)
Schedule MRI lumbar spine

## 2012-10-11 ENCOUNTER — Other Ambulatory Visit: Payer: Self-pay | Admitting: Family Medicine

## 2012-10-13 ENCOUNTER — Ambulatory Visit (HOSPITAL_COMMUNITY)
Admission: RE | Admit: 2012-10-13 | Discharge: 2012-10-13 | Disposition: A | Discharge status: Home / Self Care | Payer: Medicare Other | Source: Ambulatory Visit | Attending: Orthopedic Surgery | Admitting: Orthopedic Surgery

## 2012-10-13 ENCOUNTER — Encounter (HOSPITAL_COMMUNITY): Payer: Self-pay

## 2012-10-13 ENCOUNTER — Telehealth: Payer: Self-pay | Admitting: Radiology

## 2012-10-13 DIAGNOSIS — M545 Low back pain, unspecified: Secondary | ICD-10-CM | POA: Insufficient documentation

## 2012-10-13 DIAGNOSIS — M5137 Other intervertebral disc degeneration, lumbosacral region: Secondary | ICD-10-CM

## 2012-10-13 DIAGNOSIS — M25551 Pain in right hip: Secondary | ICD-10-CM

## 2012-10-13 NOTE — Telephone Encounter (Signed)
Patient has MRI at [facility] on 10-15-12 at 10:45. Patient has [facility], authorization # [id number] and it expires on 11-26-12. Dr. Romeo Apple will call her with her results and then we will get her set up for ESI injections.

## 2012-10-15 ENCOUNTER — Ambulatory Visit (HOSPITAL_COMMUNITY): Payer: Medicare Other

## 2012-10-26 ENCOUNTER — Telehealth: Payer: Self-pay | Admitting: *Deleted

## 2012-10-26 NOTE — Telephone Encounter (Signed)
Patient called and was wanting to know about her MRI results. Her phone number is [phone].

## 2012-11-02 ENCOUNTER — Telehealth: Payer: Self-pay | Admitting: Orthopedic Surgery

## 2012-11-02 NOTE — Telephone Encounter (Signed)
I spoke with patient, per Dr. Mort Sawyers note:   Call to schedule for hip joint injection I can fit that in at her convenience.  Appointment scheduled.  Done.

## 2012-11-05 ENCOUNTER — Ambulatory Visit (INDEPENDENT_AMBULATORY_CARE_PROVIDER_SITE_OTHER): Payer: Medicare Other | Admitting: Orthopedic Surgery

## 2012-11-05 VITALS — BP 138/78 | Ht 60.0 in | Wt 230.0 lb

## 2012-11-05 DIAGNOSIS — M7071 Other bursitis of hip, right hip: Secondary | ICD-10-CM

## 2012-11-05 DIAGNOSIS — M76899 Other specified enthesopathies of unspecified lower limb, excluding foot: Secondary | ICD-10-CM

## 2012-11-05 NOTE — Progress Notes (Signed)
Patient ID: Sharon [name], female   DOB: [birth year], 57 y.o.   MRN: [id number] Sharon [name] complains of right hip pain she had an injection on 06/25/2012 did not get much relief we flushed might be having some radicular symptoms so she had an MRI of her lumbar spine which showed mild bilateral facet degenerative changes bulging disc at L5-S1 with extending disc protrusion and minimal impression on the S1 nerve root right greater than left shin osteophyte complex with disc material on the left at the same region with in significant compression of the L5 root mild bilateral facet degenerative changes and mild bulge L4-L5 on the right  We decided to try a repeat injection in the hip with a spinal needle  Spinal  Inject RIGHT hip bursa Hip  Injection Procedure Note  Pre-operative Diagnosis: right hip Bursitis Post-operative Diagnosis: same  Indications: pain  Anesthesia: ethyl chloride   Procedure Details   Verbal consent was obtained for the procedure. Time out was completed.The RIGHT greater trochanter was prepped with alcohol, followed by  Ethyl chloride spray and A spinal needle was inserted into the area of maximal tenderness.  1ml 1% lidocaine and 1 ml of depomedrol  was then injected into the bursa . The needle was removed and the area cleansed and dressed.  Complications:  None; patient tolerated the procedure well.

## 2012-11-23 ENCOUNTER — Other Ambulatory Visit: Payer: Self-pay

## 2012-11-23 MED ORDER — PANTOPRAZOLE SODIUM 40 MG PO TBEC: DELAYED_RELEASE_TABLET | ORAL | Status: DC

## 2012-11-24 ENCOUNTER — Encounter: Payer: Self-pay | Admitting: *Deleted

## 2012-12-01 ENCOUNTER — Encounter: Payer: Self-pay | Admitting: Orthopedic Surgery

## 2012-12-01 ENCOUNTER — Ambulatory Visit (INDEPENDENT_AMBULATORY_CARE_PROVIDER_SITE_OTHER): Payer: Medicare Other | Admitting: Orthopedic Surgery

## 2012-12-01 VITALS — BP 126/70 | Ht 60.0 in | Wt 230.0 lb

## 2012-12-01 DIAGNOSIS — M549 Dorsalgia, unspecified: Secondary | ICD-10-CM

## 2012-12-01 DIAGNOSIS — IMO0002 Reserved for concepts with insufficient information to code with codable children: Secondary | ICD-10-CM

## 2012-12-01 DIAGNOSIS — M5410 Radiculopathy, site unspecified: Secondary | ICD-10-CM

## 2012-12-01 NOTE — Progress Notes (Signed)
Patient ID: Sharon [name], female   DOB: 1955/10/19, 57 y.o.   MRN: [id number] Chief Complaint  Patient presents with  . Follow-up    2 week follow up right hip s/p injection    Status post bursitis injection right hip a spinal needle did well but she says the back is still bothering her and this injection made it more clear that her lumbar spine is still symptomatic. We did an updated MRI in May she would like to proceed with epidural injections after reviewing the MRI I think L5-S1 would be the most likely area to inject  System review no numbness or tingling occasional right posterior thigh pain and buttock pain   Order epidural steroid injection She will call us for an appointment after her second epidural to reevaluate her situation

## 2012-12-01 NOTE — Patient Instructions (Signed)
Ordered ESI injections

## 2012-12-15 ENCOUNTER — Telehealth: Payer: Self-pay | Admitting: Orthopedic Surgery

## 2012-12-15 NOTE — Telephone Encounter (Signed)
Patient called, states has not heard from our office or referral office about her referral for epidural steroid injection.  Patient ph# [phone].

## 2012-12-15 NOTE — Telephone Encounter (Signed)
Printed order and faxed to [facility]

## 2012-12-29 ENCOUNTER — Other Ambulatory Visit: Payer: Self-pay | Admitting: Family Medicine

## 2012-12-30 ENCOUNTER — Ambulatory Visit
Admission: RE | Admit: 2012-12-30 | Discharge: 2012-12-30 | Disposition: A | Discharge status: Home / Self Care | Payer: Medicare Other | Source: Ambulatory Visit | Attending: Orthopedic Surgery | Admitting: Orthopedic Surgery

## 2012-12-30 DIAGNOSIS — M5410 Radiculopathy, site unspecified: Secondary | ICD-10-CM

## 2012-12-30 MED ORDER — IOHEXOL 180 MG/ML  SOLN
1.0000 mL | Freq: Once | INTRAMUSCULAR | Status: AC | PRN
  Administered 2012-12-30: 1 mL via EPIDURAL

## 2012-12-30 MED ORDER — METHYLPREDNISOLONE ACETATE 40 MG/ML INJ SUSP (RADIOLOG
120.0000 mg | Freq: Once | INTRAMUSCULAR | Status: AC
  Administered 2012-12-30: 120 mg via EPIDURAL

## 2013-01-11 ENCOUNTER — Telehealth: Payer: Self-pay | Admitting: Orthopedic Surgery

## 2013-01-11 ENCOUNTER — Other Ambulatory Visit: Payer: Self-pay | Admitting: *Deleted

## 2013-01-11 DIAGNOSIS — M549 Dorsalgia, unspecified: Secondary | ICD-10-CM

## 2013-01-11 NOTE — Telephone Encounter (Signed)
Patient called, states wanted to schedule her follow up appointment from epidural steroid injection - states she had one injection; office notes indicate for patient to come here for follow up after  2nd injection, which patient states she was not told.  States if this is correct, this is fine, just to please proceed with scheduling the second injection at [facility], and that she prefers to have it done by the same doctor, as it is working well so far.   I have gone ahead and scheduled her an office visit follow up for 02/04/13.  Her ph# is [phone].

## 2013-01-11 NOTE — Telephone Encounter (Signed)
Faxed order for second ESI.

## 2013-01-15 ENCOUNTER — Other Ambulatory Visit: Payer: Medicare Other

## 2013-01-22 ENCOUNTER — Ambulatory Visit
Admission: RE | Admit: 2013-01-22 | Discharge: 2013-01-22 | Disposition: A | Discharge status: Home / Self Care | Payer: Medicare Other | Source: Ambulatory Visit | Attending: Orthopedic Surgery | Admitting: Orthopedic Surgery

## 2013-01-22 VITALS — BP 141/68 | HR 75

## 2013-01-22 DIAGNOSIS — M549 Dorsalgia, unspecified: Secondary | ICD-10-CM

## 2013-01-22 MED ORDER — IOHEXOL 180 MG/ML  SOLN
1.0000 mL | Freq: Once | INTRAMUSCULAR | Status: AC | PRN
  Administered 2013-01-22: 1 mL via EPIDURAL

## 2013-01-22 MED ORDER — METHYLPREDNISOLONE ACETATE 40 MG/ML INJ SUSP (RADIOLOG
120.0000 mg | Freq: Once | INTRAMUSCULAR | Status: AC
  Administered 2013-01-22: 120 mg via EPIDURAL

## 2013-02-02 ENCOUNTER — Other Ambulatory Visit: Payer: Self-pay | Admitting: *Deleted

## 2013-02-02 MED ORDER — PANTOPRAZOLE SODIUM 40 MG PO TBEC: DELAYED_RELEASE_TABLET | ORAL | Status: DC

## 2013-02-04 ENCOUNTER — Encounter: Payer: Self-pay | Admitting: Orthopedic Surgery

## 2013-02-04 ENCOUNTER — Ambulatory Visit (INDEPENDENT_AMBULATORY_CARE_PROVIDER_SITE_OTHER): Payer: Medicare Other | Admitting: Orthopedic Surgery

## 2013-02-04 VITALS — BP 127/73 | Ht 60.0 in | Wt 230.0 lb

## 2013-02-04 DIAGNOSIS — G5790 Unspecified mononeuropathy of unspecified lower limb: Secondary | ICD-10-CM

## 2013-02-04 NOTE — Progress Notes (Signed)
Patient ID: Sharon [name], female   DOB: [birth year], 57 y.o.   MRN: [id number]  Chief Complaint  Patient presents with  . Follow-up    follow up Back and leg pain s/p ESI    Lumbar disc disease. Excellent result from second epidural. Currently using ibuprofen for pain  Doing well  Followup 2 months.

## 2013-03-11 ENCOUNTER — Other Ambulatory Visit: Payer: Self-pay | Admitting: Family Medicine

## 2013-03-19 ENCOUNTER — Other Ambulatory Visit: Payer: Self-pay | Admitting: Family Medicine

## 2013-04-06 ENCOUNTER — Encounter: Payer: Self-pay | Admitting: Orthopedic Surgery

## 2013-04-06 ENCOUNTER — Ambulatory Visit (INDEPENDENT_AMBULATORY_CARE_PROVIDER_SITE_OTHER): Payer: Medicare Other | Admitting: Orthopedic Surgery

## 2013-04-06 VITALS — BP 114/69 | Ht 60.0 in | Wt 230.0 lb

## 2013-04-06 DIAGNOSIS — M5137 Other intervertebral disc degeneration, lumbosacral region: Secondary | ICD-10-CM

## 2013-04-06 DIAGNOSIS — M25551 Pain in right hip: Secondary | ICD-10-CM

## 2013-04-06 DIAGNOSIS — M25559 Pain in unspecified hip: Secondary | ICD-10-CM

## 2013-04-06 MED ORDER — HYDROCODONE-ACETAMINOPHEN 10-325 MG PO TABS: 1.0000 | ORAL_TABLET | Freq: Four times a day (QID) | ORAL | Status: DC | PRN

## 2013-04-06 NOTE — Patient Instructions (Signed)
Continue current treatment. 

## 2013-04-06 NOTE — Progress Notes (Signed)
Patient ID: Sharon [name], female   DOB: [birth year], 57 y.o.   MRN: [id number]  Chief Complaint  Patient presents with  . Follow-up    2 month follow up back and right leg s/p ESI    BP 114/69  Ht 5' (1.524 m)  Wt 230 lb (104.327 kg)  BMI 44.92 kg/m2  Encounter Diagnoses  Name Primary?  . Hip pain, right Yes  . DDD (degenerative disc disease), lumbosacral     The patient is doing very well at this time she takes Advil maybe one a day and a rare hydrocodone if her activity level picks up. She still has some discomfort with standing for long periods of time but her right leg pain most of her back pain have resolved. She is doing well she wants to start exercising.  She is to continue with her current regimen. Followup in January for bilateral total knee x-rays as her annual knee film series is to do  Meds ordered this encounter  Medications  . HYDROcodone-acetaminophen (NORCO) 10-325 MG per tablet    Sig: Take 1 tablet by mouth every 6 (six) hours as needed.    Dispense:  100 tablet    Refill:  0

## 2013-05-11 ENCOUNTER — Other Ambulatory Visit: Payer: Self-pay | Admitting: Family Medicine

## 2013-05-11 NOTE — Telephone Encounter (Signed)
Sorry not seen in a yr needs ov

## 2013-05-14 ENCOUNTER — Other Ambulatory Visit: Payer: Self-pay | Admitting: *Deleted

## 2013-05-14 MED ORDER — FUROSEMIDE 20 MG PO TABS: 20.0000 mg | ORAL_TABLET | Freq: Every day | ORAL | Status: DC

## 2013-05-30 ENCOUNTER — Other Ambulatory Visit: Payer: Self-pay | Admitting: Family Medicine

## 2013-06-02 ENCOUNTER — Other Ambulatory Visit: Payer: Self-pay | Admitting: Family Medicine

## 2013-06-07 ENCOUNTER — Ambulatory Visit (INDEPENDENT_AMBULATORY_CARE_PROVIDER_SITE_OTHER): Payer: Medicare Other | Admitting: Family Medicine

## 2013-06-07 ENCOUNTER — Encounter: Payer: Self-pay | Admitting: Family Medicine

## 2013-06-07 VITALS — BP 134/96 | Temp 98.2°F | Ht 60.0 in | Wt 253.0 lb

## 2013-06-07 DIAGNOSIS — J019 Acute sinusitis, unspecified: Secondary | ICD-10-CM

## 2013-06-07 MED ORDER — LEVOFLOXACIN 500 MG PO TABS: 500.0000 mg | ORAL_TABLET | Freq: Every day | ORAL | Status: DC

## 2013-06-07 MED ORDER — ALBUTEROL SULFATE HFA 108 (90 BASE) MCG/ACT IN AERS: 2.0000 | INHALATION_SPRAY | Freq: Four times a day (QID) | RESPIRATORY_TRACT | Status: DC | PRN

## 2013-06-07 NOTE — Patient Instructions (Signed)

## 2013-06-07 NOTE — Progress Notes (Signed)
   Subjective:    Patient ID: Sharon [name], female    DOB: Jan 07, 1956, 58 y.o.   MRN: [id number]  Sinusitis This is a new problem. The current episode started in the past 7 days. There has been no fever. Associated symptoms include congestion, coughing, headaches and sinus pressure. Treatments tried: ibu. The treatment provided mild relief.   Had flu like illness last week Severe fatigue  eat fair , had nausea Staying hydrated Review of Systems  HENT: Positive for congestion and sinus pressure.   Respiratory: Positive for cough.   Neurological: Positive for headaches.  severe sinus pain     Objective:   Physical Exam  Nursing note and vitals reviewed. Constitutional: She appears well-developed.  HENT:  Head: Normocephalic.  Nose: Nose normal.  Mouth/Throat: Oropharynx is clear and moist. No oropharyngeal exudate.  Neck: Neck supple.  Cardiovascular: Normal rate and normal heart sounds.   No murmur heard. Pulmonary/Chest: Effort normal and breath sounds normal. She has no wheezes.  Lymphadenopathy:    She has no cervical adenopathy.  Skin: Skin is warm and dry.          Assessment & Plan:  Acute sinusitis antibiotics prescribed followup if ongoing trouble warning signs discussed

## 2013-06-17 ENCOUNTER — Encounter: Payer: Self-pay | Admitting: Orthopedic Surgery

## 2013-06-17 ENCOUNTER — Ambulatory Visit (INDEPENDENT_AMBULATORY_CARE_PROVIDER_SITE_OTHER): Payer: Medicare Other | Admitting: Orthopedic Surgery

## 2013-06-17 ENCOUNTER — Ambulatory Visit (INDEPENDENT_AMBULATORY_CARE_PROVIDER_SITE_OTHER): Payer: Medicare Other

## 2013-06-17 VITALS — BP 134/79 | Ht 60.0 in | Wt 230.0 lb

## 2013-06-17 DIAGNOSIS — M5126 Other intervertebral disc displacement, lumbar region: Secondary | ICD-10-CM

## 2013-06-17 DIAGNOSIS — Z96651 Presence of right artificial knee joint: Secondary | ICD-10-CM

## 2013-06-17 DIAGNOSIS — Z96659 Presence of unspecified artificial knee joint: Secondary | ICD-10-CM

## 2013-06-17 DIAGNOSIS — Z96652 Presence of left artificial knee joint: Secondary | ICD-10-CM | POA: Insufficient documentation

## 2013-06-17 NOTE — Progress Notes (Addendum)
Patient ID: Sharon [name], female   DOB: 1955-08-21, 58 y.o.   MRN: [id number] Encounter Diagnoses  Name Primary?  . Status post right knee replacement Yes  . Status post left knee replacement   . [place]     Chief Complaint  Patient presents with  . Follow-up    Yearly recheck bilateral knees with xrays    BP 134/79  Ht 5' (1.524 m)  Wt 230 lb (104.327 kg)  BMI 44.92 kg/m2  HISTORY: Patient comes in for routine followup status post bilateral total knees Right knee replacement 2013  Left knee replacement 2009   She's been treated in the past for herniated lumbosacral disc. She is stable in regards to that.  Her knees are doing well but she's had the flu she's had some body aches including shoulders hips and legs  BP 134/79  Ht 5' (1.524 m)  Wt 230 lb (104.327 kg)  BMI 44.92 kg/m2 General appearance is normal, the patient is alert and oriented x3 with normal mood and affect. She is ambulatory without assistive devices and has good flexion in both knees  Imaging shows stable implants  Followup in a year repeat bilateral x-rays

## 2013-06-17 NOTE — Patient Instructions (Signed)
activities as tolerated 

## 2013-06-24 ENCOUNTER — Ambulatory Visit: Payer: Medicare Other | Admitting: Orthopedic Surgery

## 2013-07-08 ENCOUNTER — Ambulatory Visit (INDEPENDENT_AMBULATORY_CARE_PROVIDER_SITE_OTHER): Payer: Medicare Other | Admitting: Nurse Practitioner

## 2013-07-08 ENCOUNTER — Encounter: Payer: Self-pay | Admitting: Nurse Practitioner

## 2013-07-08 VITALS — BP 134/86 | Ht 60.0 in | Wt 251.0 lb

## 2013-07-08 DIAGNOSIS — M76899 Other specified enthesopathies of unspecified lower limb, excluding foot: Secondary | ICD-10-CM

## 2013-07-08 DIAGNOSIS — M7071 Other bursitis of hip, right hip: Secondary | ICD-10-CM

## 2013-07-08 MED ORDER — NABUMETONE 750 MG PO TABS: 750.0000 mg | ORAL_TABLET | Freq: Two times a day (BID) | ORAL | Status: DC | PRN

## 2013-07-08 MED ORDER — PHENTERMINE HCL 37.5 MG PO CAPS: 37.5000 mg | ORAL_CAPSULE | ORAL | Status: DC

## 2013-07-08 MED ORDER — PHENTERMINE HCL 37.5 MG PO TABS: 37.5000 mg | ORAL_TABLET | Freq: Every day | ORAL | Status: DC

## 2013-07-14 DIAGNOSIS — E669 Obesity, unspecified: Secondary | ICD-10-CM | POA: Insufficient documentation

## 2013-07-14 NOTE — Assessment & Plan Note (Signed)
.   phentermine (ADIPEX-P) 37.5 MG tablet    Sig: Take 1 tablet (37.5 mg total) by mouth daily before breakfast.    Dispense:  30 tablet    Refill:  2    Order Specific Question:  Supervising Provider    Answer:  LUKING, WILLIAM S [[id number]]   Will refer to specialist for possible hip injection. Encouraged healthy diet and activity as tolerated. Recheck in 3 months if she wishes to continue phentermine, call back sooner if any problems.  

## 2013-07-14 NOTE — Progress Notes (Signed)
Subjective:  Presents for complaints of bursitis in her right hip. Requesting referral for injection. Also patient having great deal of difficulty losing weight especially with limited activity due to orthopedic issues. Has taken phentermine in the past without difficulty.  Objective:   BP 134/86  Ht 5' (1.524 m)  Wt 251 lb (113.853 kg)  BMI 49.02 kg/m2 NAD. Alert, oriented. Lungs clear. Heart regular rate rhythm.  Assessment:Bursitis of right hip  Morbid obesity   Plan: Meds ordered this encounter  Medications  . DISCONTD: phentermine 37.5 MG capsule    Sig: Take 1 capsule (37.5 mg total) by mouth every morning.    Dispense:  30 capsule    Refill:  2    Order Specific Question:  Supervising Provider    Answer:  [name] [[id number]]  . nabumetone (RELAFEN) 750 MG tablet    Sig: Take 1 tablet (750 mg total) by mouth 2 (two) times daily as needed for moderate pain.    Dispense:  60 tablet    Refill:  0    Order Specific Question:  Supervising Provider    Answer:  [name] [[id number]]  . phentermine (ADIPEX-P) 37.5 MG tablet    Sig: Take 1 tablet (37.5 mg total) by mouth daily before breakfast.    Dispense:  30 tablet    Refill:  2    Order Specific Question:  Supervising Provider    Answer:  Maggie Font   Will refer to specialist for possible hip injection. Encouraged healthy diet and activity as tolerated. Recheck in 3 months if she wishes to continue phentermine, call back sooner if any problems.

## 2013-07-14 NOTE — Assessment & Plan Note (Signed)
.   phentermine (ADIPEX-P) 37.5 MG tablet    Sig: Take 1 tablet (37.5 mg total) by mouth daily before breakfast.    Dispense:  30 tablet    Refill:  2    Order Specific Question:  Supervising Provider    Answer:  Maggie Font   Will refer to specialist for possible hip injection. Encouraged healthy diet and activity as tolerated. Recheck in 3 months if she wishes to continue phentermine, call back sooner if any problems.

## 2013-07-16 ENCOUNTER — Telehealth: Payer: Self-pay | Admitting: Family Medicine

## 2013-07-16 NOTE — Telephone Encounter (Signed)
Parking placard form please

## 2013-07-16 NOTE — Telephone Encounter (Signed)
Patient notified

## 2013-07-16 NOTE — Telephone Encounter (Signed)
;  okp

## 2013-07-21 ENCOUNTER — Other Ambulatory Visit: Payer: Self-pay | Admitting: Nurse Practitioner

## 2013-07-21 DIAGNOSIS — M25551 Pain in right hip: Secondary | ICD-10-CM

## 2013-07-28 ENCOUNTER — Encounter: Payer: Self-pay | Admitting: Nurse Practitioner

## 2013-07-28 ENCOUNTER — Ambulatory Visit (INDEPENDENT_AMBULATORY_CARE_PROVIDER_SITE_OTHER): Payer: Medicare Other | Admitting: Nurse Practitioner

## 2013-07-28 VITALS — BP 138/86 | Temp 98.9°F | Ht 60.0 in | Wt 248.2 lb

## 2013-07-28 DIAGNOSIS — J209 Acute bronchitis, unspecified: Secondary | ICD-10-CM

## 2013-07-28 DIAGNOSIS — J069 Acute upper respiratory infection, unspecified: Secondary | ICD-10-CM

## 2013-07-28 MED ORDER — LEVOFLOXACIN 500 MG PO TABS: 500.0000 mg | ORAL_TABLET | Freq: Every day | ORAL | Status: DC

## 2013-07-28 MED ORDER — HYDROCODONE-HOMATROPINE 5-1.5 MG/5ML PO SYRP: 5.0000 mL | ORAL_SOLUTION | ORAL | Status: DC | PRN

## 2013-07-28 MED ORDER — PREDNISONE 20 MG PO TABS: ORAL_TABLET | ORAL | Status: DC

## 2013-07-28 NOTE — Progress Notes (Signed)
Subjective:  Presents for complaints of sore throat cough that began 5 days ago. No fever. Frequent cough. Burning in the anterior chest with coughing. Wheezing at times, used her inhaler 4 times yesterday, last time was 2 hours ago. No headache. Left ear pressure. Sore throat. No vomiting diarrhea or abdominal pain. No acid reflux or heartburn, well-controlled. No relief with OTC meds.  Objective:   BP 138/86  Temp(Src) 98.9 F (37.2 C) (Oral)  Ht 5' (1.524 m)  Wt 248 lb 3.2 oz (112.583 kg)  BMI 48.47 kg/m2 NAD. Alert, oriented. TMs clear effusion, no erythema. Pharynx moderately erythematous with PND noted. Neck supple with mild soft anterior adenopathy. Lungs scattered faint expiratory crackles, no wheezing or tachypnea. Heart regular rate rhythm. Abdomen soft nondistended, no epigastric area tenderness.  Assessment:Acute upper respiratory infections of unspecified site  Acute bronchitis  Plan: Meds ordered this encounter  Medications  . levofloxacin (LEVAQUIN) 500 MG tablet    Sig: Take 1 tablet (500 mg total) by mouth daily.    Dispense:  10 tablet    Refill:  0    Order Specific Question:  Supervising Provider    Answer:  [name] [[id number]]  . predniSONE (DELTASONE) 20 MG tablet    Sig: 3 po qd x 3 d then 2 po qd x 3 d then 1 po qd x 3 d    Dispense:  18 tablet    Refill:  0    HOLD THIS FOR PATIENT    Order Specific Question:  Supervising Provider    Answer:  [name] [[id number]]  . HYDROcodone-homatropine (HYCODAN) 5-1.5 MG/5ML syrup    Sig: Take 5 mLs by mouth every 4 (four) hours as needed.    Dispense:  120 mL    Refill:  0    Order Specific Question:  Supervising Provider    Answer:  [name] [[id number]]   Given prescription for prednisone as a precaution. Continue OTC meds as directed. Call back in 5-7 days if no improvement, sooner if worse.

## 2013-08-02 ENCOUNTER — Ambulatory Visit
Admission: RE | Admit: 2013-08-02 | Discharge: 2013-08-02 | Disposition: A | Discharge status: Home / Self Care | Payer: Medicare Other | Source: Ambulatory Visit | Attending: Nurse Practitioner | Admitting: Nurse Practitioner

## 2013-08-02 DIAGNOSIS — M25551 Pain in right hip: Secondary | ICD-10-CM

## 2013-08-02 MED ORDER — IOHEXOL 180 MG/ML  SOLN
1.0000 mL | Freq: Once | INTRAMUSCULAR | Status: AC | PRN
  Administered 2013-08-02: 1 mL via INTRA_ARTICULAR

## 2013-08-02 MED ORDER — METHYLPREDNISOLONE ACETATE 40 MG/ML INJ SUSP (RADIOLOG
120.0000 mg | Freq: Once | INTRAMUSCULAR | Status: AC
  Administered 2013-08-02: 120 mg via INTRA_ARTICULAR

## 2013-08-04 ENCOUNTER — Other Ambulatory Visit: Payer: Self-pay | Admitting: Nurse Practitioner

## 2013-08-04 ENCOUNTER — Other Ambulatory Visit: Payer: Self-pay | Admitting: Family Medicine

## 2013-08-04 NOTE — Telephone Encounter (Signed)
Seen 07/28/13 

## 2013-08-04 NOTE — Telephone Encounter (Signed)
Seen 07/28/13

## 2013-08-18 ENCOUNTER — Other Ambulatory Visit: Payer: Self-pay | Admitting: Family Medicine

## 2013-09-06 ENCOUNTER — Other Ambulatory Visit: Payer: Self-pay

## 2013-09-06 DIAGNOSIS — Z1231 Encounter for screening mammogram for malignant neoplasm of breast: Secondary | ICD-10-CM

## 2013-09-15 ENCOUNTER — Other Ambulatory Visit: Payer: Self-pay | Admitting: Nurse Practitioner

## 2013-09-17 ENCOUNTER — Ambulatory Visit (INDEPENDENT_AMBULATORY_CARE_PROVIDER_SITE_OTHER): Payer: Medicare Other | Admitting: Nurse Practitioner

## 2013-09-17 ENCOUNTER — Encounter: Payer: Self-pay | Admitting: Nurse Practitioner

## 2013-09-17 VITALS — BP 120/88 | Ht 60.5 in | Wt 244.5 lb

## 2013-09-17 DIAGNOSIS — Z01419 Encounter for gynecological examination (general) (routine) without abnormal findings: Secondary | ICD-10-CM

## 2013-09-17 DIAGNOSIS — Z0000 Encounter for general adult medical examination without abnormal findings: Secondary | ICD-10-CM

## 2013-09-17 DIAGNOSIS — Z23 Encounter for immunization: Secondary | ICD-10-CM

## 2013-09-17 MED ORDER — FUROSEMIDE 20 MG PO TABS: 20.0000 mg | ORAL_TABLET | Freq: Every day | ORAL | Status: DC

## 2013-09-17 MED ORDER — POTASSIUM CHLORIDE CRYS ER 10 MEQ PO TBCR: EXTENDED_RELEASE_TABLET | ORAL | Status: DC

## 2013-09-17 MED ORDER — PANTOPRAZOLE SODIUM 40 MG PO TBEC: DELAYED_RELEASE_TABLET | ORAL | Status: DC

## 2013-09-17 MED ORDER — METHOCARBAMOL 500 MG PO TABS: ORAL_TABLET | ORAL | Status: DC

## 2013-09-17 MED ORDER — ALPRAZOLAM 0.5 MG PO TABS: ORAL_TABLET | ORAL | Status: DC

## 2013-09-17 MED ORDER — PHENTERMINE HCL 37.5 MG PO TABS: 37.5000 mg | ORAL_TABLET | Freq: Every day | ORAL | Status: DC

## 2013-09-17 MED ORDER — NABUMETONE 750 MG PO TABS: ORAL_TABLET | ORAL | Status: DC

## 2013-09-17 NOTE — Progress Notes (Signed)
No falls in the past 6 months, Mini Cog Test- passed.

## 2013-09-20 ENCOUNTER — Encounter: Payer: Self-pay | Admitting: Nurse Practitioner

## 2013-09-20 NOTE — Progress Notes (Signed)
Subjective:    Patient ID: Sharon [name], female    DOB: 1955-06-17, 58 y.o.   MRN: [id number]  HPI Presents for wellness exam. Needs vision and dental exams. No new sexual partners. No pelvic pain or discharge. Limited activity due to orthopaedic issues. Has been off phentermine for over 6 months; would like to restart.    Review of Systems  Constitutional: Negative for activity change, appetite change and fatigue.  HENT: Negative for dental problem, ear pain, sinus pressure and sore throat.   Respiratory: Negative for cough, chest tightness, shortness of breath and wheezing.   Cardiovascular: Negative for chest pain.  Gastrointestinal: Negative for nausea, vomiting, abdominal pain, diarrhea, constipation, blood in stool and abdominal distention.  Genitourinary: Negative for dysuria, urgency, frequency, vaginal discharge, enuresis, difficulty urinating, genital sores and pelvic pain.  Musculoskeletal: Positive for arthralgias and back pain.       Objective:   Physical Exam  Vitals reviewed. Constitutional: She is oriented to person, place, and time. She appears well-developed. No distress.  HENT:  Right Ear: External ear normal.  Left Ear: External ear normal.  Mouth/Throat: Oropharynx is clear and moist.  Neck: Normal range of motion. Neck supple. No tracheal deviation present. No thyromegaly present.  Cardiovascular: Normal rate, regular rhythm and normal heart sounds.  Exam reveals no gallop.   No murmur heard. Pulmonary/Chest: Effort normal and breath sounds normal.  Abdominal: Soft. She exhibits no distension. There is no tenderness.  Genitourinary: Vagina normal. No vaginal discharge found.  External GU: no lesions or rashes. Vagina: pale, no discharge. Bimanual exam: no tenderness or obvious masses; limited due to abd girth. Rectal exam: normal;  No stool for hemoccult  Musculoskeletal: She exhibits no edema.  Lymphadenopathy:    She has no cervical adenopathy.    Neurological: She is alert and oriented to person, place, and time.  Skin: Skin is warm and dry. No rash noted.  Psychiatric: She has a normal mood and affect. Her behavior is normal.  Breast exam: no masses; axillae: no adenopathy       Assessment & Plan:  Well woman exam  Morbid obesity  Routine general medical examination at a health care facility - Plan: Basic metabolic panel, CBC with Differential, Hepatic function panel, Lipid panel, TSH  Need for prophylactic vaccination with combined diphtheria-tetanus-pertussis (DTP) vaccine - Plan: Td vaccine greater than or equal to 7yo preservative free IM  Meds ordered this encounter  Medications  . phentermine (ADIPEX-P) 37.5 MG tablet    Sig: Take 1 tablet (37.5 mg total) by mouth daily before breakfast.    Dispense:  30 tablet    Refill:  2    Order Specific Question:  Supervising Provider    Answer:  [name] [[id number]]  . pantoprazole (PROTONIX) 40 MG tablet    Sig: TAKE ONE TABLET BY MOUTH ONCE DAILY for reflux    Dispense:  30 tablet    Refill:  2    Order Specific Question:  Supervising Provider    Answer:  [name] [[id number]]  . nabumetone (RELAFEN) 750 MG tablet    Sig: TAKE ONE TABLET BY MOUTH TWICE DAILY AS NEEDED FOR  MODERATE  PAIN    Dispense:  60 tablet    Refill:  2    Order Specific Question:  Supervising Provider    Answer:  [name] [[id number]]  . methocarbamol (ROBAXIN) 500 MG tablet    Sig: TAKE ONE TABLET BY MOUTH EVERY 6  HOURS AS NEEDED FOR  BACK  PAIN    Dispense:  60 tablet    Refill:  2    Order Specific Question:  Supervising Provider    Answer:  [name] [[id number]]  . furosemide (LASIX) 20 MG tablet    Sig: Take 1 tablet (20 mg total) by mouth daily.    Dispense:  30 tablet    Refill:  2    Order Specific Question:  Supervising Provider    Answer:  [name] [[id number]]  . potassium chloride (KLOR-CON M10) 10 MEQ tablet    Sig: TAKE ONE TABLET BY MOUTH TWICE DAILY     Dispense:  60 tablet    Refill:  2    Order Specific Question:  Supervising Provider    Answer:  [name] [[id number]]  . ALPRAZolam (XANAX) 0.5 MG tablet    Sig: TAKE ONE TABLET BY MOUTH TWICE DAILY AS NEEDED --  **MAY  FILL  MONTHLY**    Dispense:  30 tablet    Refill:  2    Order Specific Question:  Supervising Provider    Answer:  Maggie Font

## 2013-10-05 ENCOUNTER — Ambulatory Visit
Admission: RE | Admit: 2013-10-05 | Discharge: 2013-10-05 | Disposition: A | Discharge status: Home / Self Care | Payer: Medicare Other | Source: Ambulatory Visit

## 2013-10-05 DIAGNOSIS — Z1231 Encounter for screening mammogram for malignant neoplasm of breast: Secondary | ICD-10-CM

## 2013-10-12 ENCOUNTER — Encounter: Payer: Self-pay | Admitting: Family Medicine

## 2013-10-12 ENCOUNTER — Ambulatory Visit (INDEPENDENT_AMBULATORY_CARE_PROVIDER_SITE_OTHER): Payer: Medicare Other | Admitting: Family Medicine

## 2013-10-12 VITALS — BP 148/90 | Temp 98.4°F | Ht 60.5 in | Wt 244.0 lb

## 2013-10-12 DIAGNOSIS — J45909 Unspecified asthma, uncomplicated: Secondary | ICD-10-CM

## 2013-10-12 DIAGNOSIS — J683 Other acute and subacute respiratory conditions due to chemicals, gases, fumes and vapors: Secondary | ICD-10-CM

## 2013-10-12 DIAGNOSIS — J329 Chronic sinusitis, unspecified: Secondary | ICD-10-CM

## 2013-10-12 MED ORDER — LEVOFLOXACIN 500 MG PO TABS: 500.0000 mg | ORAL_TABLET | Freq: Every day | ORAL | Status: AC

## 2013-10-12 MED ORDER — PREDNISONE 20 MG PO TABS: ORAL_TABLET | ORAL | Status: DC

## 2013-10-12 NOTE — Progress Notes (Signed)
   Subjective:    Patient ID: Sharon [name], female    DOB: 11-05-1955, 58 y.o.   MRN: [id number]  Sore Throat  This is a new problem. The current episode started today. Neither side of throat is experiencing more pain than the other. There has been no fever. Associated symptoms include congestion and coughing. She has tried NSAIDs for the symptoms. The treatment provided mild relief.   Cough and cong  dinminished energy  achey  occas cough. Progressive wheezing. Burning in chest. Productive at times. Positive nasal discharge yellowish.  Three days  enrgy achey  Felt bad  Daughter in the hospital with similar illness.     Review of Systems  HENT: Positive for congestion.   Respiratory: Positive for cough.    No vomiting no diarrhea no rash ROS otherwise negative alert moderate malaise. H&T moderate his congestion pharynx slight erythema neck supple. Lungs bilateral mild wheezes heart rare in rhythm.    Objective:   Physical Exam   See above     Assessment & Plan:  Impression rhinosinusitis/bronchitis with reactive airways plan Ventolin 2 sprays 4 times a day. Antibiotics prescribed. Prednisone taper. Warning signs discussed. WSL

## 2013-11-01 ENCOUNTER — Telehealth: Payer: Self-pay | Admitting: Orthopedic Surgery

## 2013-11-01 NOTE — Telephone Encounter (Signed)
Patient called, requests refill of pain medication, Hydrocode 10.325.  Her next scheduled (yearly) appointment is in January of 2016.  Please call her at [phone] [facility])

## 2013-11-01 NOTE — Telephone Encounter (Signed)
Routing to Dr Harrison 

## 2013-11-02 ENCOUNTER — Other Ambulatory Visit: Payer: Self-pay | Admitting: Orthopedic Surgery

## 2013-11-02 DIAGNOSIS — M5137 Other intervertebral disc degeneration, lumbosacral region: Secondary | ICD-10-CM

## 2013-11-02 DIAGNOSIS — M25551 Pain in right hip: Secondary | ICD-10-CM

## 2013-11-02 MED ORDER — HYDROCODONE-ACETAMINOPHEN 10-325 MG PO TABS: 1.0000 | ORAL_TABLET | Freq: Four times a day (QID) | ORAL | Status: DC | PRN

## 2013-11-02 NOTE — Telephone Encounter (Signed)
Dr. Aline Brochure refilled and patient notified prescription was ready to be picked up.

## 2013-12-29 ENCOUNTER — Other Ambulatory Visit: Payer: Self-pay | Admitting: Nurse Practitioner

## 2014-01-19 ENCOUNTER — Encounter: Payer: Self-pay | Admitting: Nurse Practitioner

## 2014-01-19 ENCOUNTER — Telehealth: Payer: Self-pay | Admitting: Family Medicine

## 2014-01-19 ENCOUNTER — Ambulatory Visit (INDEPENDENT_AMBULATORY_CARE_PROVIDER_SITE_OTHER): Payer: Medicare Other | Admitting: Nurse Practitioner

## 2014-01-19 VITALS — BP 140/90 | Ht 60.0 in | Wt 245.0 lb

## 2014-01-19 DIAGNOSIS — K219 Gastro-esophageal reflux disease without esophagitis: Secondary | ICD-10-CM

## 2014-01-19 DIAGNOSIS — J329 Chronic sinusitis, unspecified: Secondary | ICD-10-CM

## 2014-01-19 DIAGNOSIS — G8929 Other chronic pain: Secondary | ICD-10-CM

## 2014-01-19 DIAGNOSIS — R609 Edema, unspecified: Secondary | ICD-10-CM

## 2014-01-19 DIAGNOSIS — R600 Localized edema: Secondary | ICD-10-CM

## 2014-01-19 DIAGNOSIS — J310 Chronic rhinitis: Secondary | ICD-10-CM

## 2014-01-19 DIAGNOSIS — M51379 Other intervertebral disc degeneration, lumbosacral region without mention of lumbar back pain or lower extremity pain: Secondary | ICD-10-CM

## 2014-01-19 DIAGNOSIS — M5137 Other intervertebral disc degeneration, lumbosacral region: Secondary | ICD-10-CM

## 2014-01-19 MED ORDER — LEVOFLOXACIN 500 MG PO TABS: 500.0000 mg | ORAL_TABLET | Freq: Every day | ORAL | Status: DC

## 2014-01-19 MED ORDER — POTASSIUM CHLORIDE CRYS ER 10 MEQ PO TBCR: EXTENDED_RELEASE_TABLET | ORAL | Status: DC

## 2014-01-19 MED ORDER — PREDNISONE 20 MG PO TABS: ORAL_TABLET | ORAL | Status: DC

## 2014-01-19 MED ORDER — HYDROCODONE-ACETAMINOPHEN 10-325 MG PO TABS: 1.0000 | ORAL_TABLET | Freq: Four times a day (QID) | ORAL | Status: DC | PRN

## 2014-01-19 MED ORDER — FUROSEMIDE 20 MG PO TABS: 20.0000 mg | ORAL_TABLET | Freq: Every day | ORAL | Status: DC

## 2014-01-19 MED ORDER — METHOCARBAMOL 500 MG PO TABS: ORAL_TABLET | ORAL | Status: DC

## 2014-01-19 NOTE — Telephone Encounter (Signed)
Patient states that [facility] did not receive any of her medications from this morning. Please advise.

## 2014-01-19 NOTE — Telephone Encounter (Signed)
Discussed with patient

## 2014-01-24 NOTE — Progress Notes (Signed)
Subjective:  Resents for routine followup. Has switched to seeing Dr. Aline Brochure once a year, would like to receive her pain medication at our office. Averages 30 hydrocodone per month, has weaned this down since her surgery. Has multiple orthopedic issues including arthritis. Takes Protonix for her reflux which is working well. Relafen also helps her pain. Uses rare Xanax for sleep. 4 days ago began having a flareup of her allergies. Taking all of her preventive medications. Frequent cough worse at night. Producing green mucus. Sore throat. Slight wheeze at times starting yesterday, some relief with albuterol inhaler. No headache today.  Objective:   BP 140/90  Ht 5' (1.524 m)  Wt 245 lb (111.131 kg)  BMI 47.85 kg/m2 NAD. Alert, oriented. TMs clear effusion, no erythema. Pharynx injected with PND noted. Neck supple with mild soft anterior adenopathy. Lungs clear. Heart regular rate rhythm. Lower extremities trace-1+ pitting edema. Abdomen soft nondistended nontender.  Assessment: Problem List Items Addressed This Visit     Musculoskeletal and Integument   DDD (degenerative disc disease), lumbosacral - Primary   Relevant Medications      HYDROcodone-acetaminophen (NORCO) 10-325 MG per tablet      methocarbamol (ROBAXIN) tablet      predniSONE (DELTASONE) tablet     Other   Chronic pain   Relevant Medications      HYDROcodone-acetaminophen (NORCO) 10-325 MG per tablet      methocarbamol (ROBAXIN) tablet      predniSONE (DELTASONE) tablet    Other Visit Diagnoses   Peripheral edema        Gastroesophageal reflux disease without esophagitis        Rhinosinusitis        Relevant Medications       predniSONE (DELTASONE) tablet      Plan: Meds ordered this encounter  Medications  . HYDROcodone-acetaminophen (NORCO) 10-325 MG per tablet    Sig: Take 1 tablet by mouth every 6 (six) hours as needed.    Dispense:  100 tablet    Refill:  0    Order Specific Question:  Supervising Provider     Answer:  [name] [[id number]]  . potassium chloride (KLOR-CON M10) 10 MEQ tablet    Sig: TAKE ONE TABLET BY MOUTH TWICE DAILY    Dispense:  60 tablet    Refill:  2    Order Specific Question:  Supervising Provider    Answer:  [name] [[id number]]  . methocarbamol (ROBAXIN) 500 MG tablet    Sig: TAKE ONE TABLET BY MOUTH EVERY 6 HOURS AS NEEDED FOR  BACK  PAIN    Dispense:  60 tablet    Refill:  2    Order Specific Question:  Supervising Provider    Answer:  [name] [[id number]]  . furosemide (LASIX) 20 MG tablet    Sig: Take 1 tablet (20 mg total) by mouth daily.    Dispense:  30 tablet    Refill:  2    Order Specific Question:  Supervising Provider    Answer:  [name] [[id number]]  . predniSONE (DELTASONE) 20 MG tablet    Sig: 3 qd x 3 d, 2qd x 3d, and 1qd x 2 d    Dispense:  17 tablet    Refill:  0    Order Specific Question:  Supervising Provider    Answer:  [name] [[id number]]   Levaquin and prednisone taper as directed. Call back in 5-7 days if no improvement, sooner if  worse. Return in about 3 months (around 04/21/2014).

## 2014-02-03 ENCOUNTER — Other Ambulatory Visit: Payer: Self-pay | Admitting: Nurse Practitioner

## 2014-03-07 ENCOUNTER — Other Ambulatory Visit: Payer: Self-pay | Admitting: Nurse Practitioner

## 2014-03-25 ENCOUNTER — Encounter: Payer: Self-pay | Admitting: Nurse Practitioner

## 2014-03-25 ENCOUNTER — Ambulatory Visit (INDEPENDENT_AMBULATORY_CARE_PROVIDER_SITE_OTHER): Payer: Medicare Other | Admitting: Nurse Practitioner

## 2014-03-25 VITALS — BP 150/84 | Temp 97.9°F | Ht 60.0 in | Wt 247.0 lb

## 2014-03-25 DIAGNOSIS — L03012 Cellulitis of left finger: Secondary | ICD-10-CM

## 2014-03-25 MED ORDER — CLINDAMYCIN HCL 300 MG PO CAPS: 300.0000 mg | ORAL_CAPSULE | Freq: Three times a day (TID) | ORAL | Status: DC

## 2014-03-28 ENCOUNTER — Encounter: Payer: Self-pay | Admitting: Nurse Practitioner

## 2014-03-28 NOTE — Progress Notes (Signed)
Subjective:  Presents complaints of possible infection on the index finger of her left hand. Had a cracked area on the side of the nail in the cuticle. No specific history of injury. Tender to palpation. No fever.  Objective:   BP 150/84 mmHg  Temp(Src) 97.9 F (36.6 C)  Ht 5' (1.524 m)  Wt 247 lb (112.038 kg)  BMI 48.24 kg/m2 NAD. Alert, oriented. Mild swelling, slight pink color and tenderness noted at the end of the left index finger particularly around the nailbed. No active drainage.  Assessment: Cellulitis of finger of left hand  Plan:  Meds ordered this encounter  Medications  . clindamycin (CLEOCIN) 300 MG capsule    Sig: Take 1 capsule (300 mg total) by mouth 3 (three) times daily.    Dispense:  21 capsule    Refill:  0    Order Specific Question:  Supervising Provider    Answer:  [name] [[id number]]   Warning signs reviewed. Warm soaks. Call back early next week if no improvement, sooner if worse.

## 2014-04-15 ENCOUNTER — Other Ambulatory Visit: Payer: Self-pay | Admitting: Family Medicine

## 2014-04-15 NOTE — Telephone Encounter (Signed)
Last seen 03/25/14

## 2014-04-20 ENCOUNTER — Ambulatory Visit (INDEPENDENT_AMBULATORY_CARE_PROVIDER_SITE_OTHER): Payer: Medicare Other | Admitting: Nurse Practitioner

## 2014-04-20 ENCOUNTER — Encounter: Payer: Self-pay | Admitting: Nurse Practitioner

## 2014-04-20 VITALS — BP 148/88 | Ht 60.0 in | Wt 244.0 lb

## 2014-04-20 DIAGNOSIS — M5137 Other intervertebral disc degeneration, lumbosacral region: Secondary | ICD-10-CM

## 2014-04-24 ENCOUNTER — Encounter: Payer: Self-pay | Admitting: Nurse Practitioner

## 2014-04-24 NOTE — Progress Notes (Signed)
Subjective:  Presents for recheck of her chronic arthritis and back pain. Patient tries to avoid narcotic pain relievers as much as possible. Currently taking Relafen and Robaxin for her pain. Continues follow-up with her specialist, given prescription today for her next set of back injections which greatly helps. Also interested in medication to help her with her weight loss. Activity limited due to arthritis, patient tries to stay active.  Objective:   BP 148/88 mmHg  Ht 5' (1.524 m)  Wt 244 lb (110.678 kg)  BMI 47.65 kg/m2 NAD. Alert, oriented. Lungs clear. Heart regular rate rhythm. Significant central obesity.  Assessment:  Problem List Items Addressed This Visit      Musculoskeletal and Integument   DDD (degenerative disc disease), lumbosacral - Primary     Other   Morbid obesity     Plan: Continue current medications as directed. Given prescription for her next set of back injections. Given information on Belviq and Qsymia so she can check on cost. Call back if she decides to try one of these medications. Return in about 4 months (around 08/19/2014).

## 2014-04-27 ENCOUNTER — Other Ambulatory Visit: Payer: Self-pay | Admitting: Nurse Practitioner

## 2014-04-27 DIAGNOSIS — M545 Low back pain, unspecified: Secondary | ICD-10-CM

## 2014-04-27 DIAGNOSIS — G8929 Other chronic pain: Secondary | ICD-10-CM

## 2014-05-05 ENCOUNTER — Ambulatory Visit
Admission: RE | Admit: 2014-05-05 | Discharge: 2014-05-05 | Disposition: A | Discharge status: Home / Self Care | Payer: Medicare Other | Source: Ambulatory Visit | Attending: Nurse Practitioner | Admitting: Nurse Practitioner

## 2014-05-05 DIAGNOSIS — G8929 Other chronic pain: Secondary | ICD-10-CM

## 2014-05-05 DIAGNOSIS — M545 Low back pain, unspecified: Secondary | ICD-10-CM

## 2014-05-05 MED ORDER — IOHEXOL 180 MG/ML  SOLN
1.0000 mL | Freq: Once | INTRAMUSCULAR | Status: AC | PRN
  Administered 2014-05-05: 1 mL via EPIDURAL

## 2014-05-05 MED ORDER — METHYLPREDNISOLONE ACETATE 40 MG/ML INJ SUSP (RADIOLOG
120.0000 mg | Freq: Once | INTRAMUSCULAR | Status: AC
  Administered 2014-05-05: 120 mg via EPIDURAL

## 2014-05-05 NOTE — Discharge Instructions (Signed)

## 2014-05-12 ENCOUNTER — Telehealth: Payer: Self-pay | Admitting: Nurse Practitioner

## 2014-05-12 NOTE — Telephone Encounter (Signed)
Pt is wanting an rx for a spinal injection x2.

## 2014-05-16 ENCOUNTER — Telehealth: Payer: Self-pay | Admitting: *Deleted

## 2014-05-16 NOTE — Telephone Encounter (Signed)
Rx written. Will leave up front.

## 2014-05-16 NOTE — Telephone Encounter (Signed)
Pt notified rx for injections ready for pick up.

## 2014-05-18 ENCOUNTER — Other Ambulatory Visit: Payer: Self-pay | Admitting: Nurse Practitioner

## 2014-05-18 DIAGNOSIS — M545 Low back pain, unspecified: Secondary | ICD-10-CM

## 2014-05-18 DIAGNOSIS — G8929 Other chronic pain: Secondary | ICD-10-CM

## 2014-05-31 ENCOUNTER — Ambulatory Visit
Admission: RE | Admit: 2014-05-31 | Discharge: 2014-05-31 | Disposition: A | Discharge status: Home / Self Care | Payer: Medicare Other | Source: Ambulatory Visit | Attending: Nurse Practitioner | Admitting: Nurse Practitioner

## 2014-05-31 DIAGNOSIS — M545 Low back pain, unspecified: Secondary | ICD-10-CM

## 2014-05-31 DIAGNOSIS — G8929 Other chronic pain: Secondary | ICD-10-CM

## 2014-05-31 MED ORDER — IOHEXOL 180 MG/ML  SOLN
1.0000 mL | Freq: Once | INTRAMUSCULAR | Status: AC | PRN
  Administered 2014-05-31: 1 mL via EPIDURAL

## 2014-05-31 MED ORDER — METHYLPREDNISOLONE ACETATE 40 MG/ML INJ SUSP (RADIOLOG
120.0000 mg | Freq: Once | INTRAMUSCULAR | Status: AC
  Administered 2014-05-31: 120 mg via EPIDURAL

## 2014-06-09 ENCOUNTER — Encounter (HOSPITAL_COMMUNITY): Payer: Self-pay | Admitting: Orthopedic Surgery

## 2014-06-13 ENCOUNTER — Other Ambulatory Visit: Payer: Self-pay | Admitting: Nurse Practitioner

## 2014-06-13 ENCOUNTER — Other Ambulatory Visit: Payer: Self-pay | Admitting: Family Medicine

## 2014-06-21 ENCOUNTER — Ambulatory Visit: Payer: Medicare Other | Admitting: Orthopedic Surgery

## 2014-06-30 ENCOUNTER — Ambulatory Visit (INDEPENDENT_AMBULATORY_CARE_PROVIDER_SITE_OTHER): Payer: Medicare Other

## 2014-06-30 ENCOUNTER — Ambulatory Visit (INDEPENDENT_AMBULATORY_CARE_PROVIDER_SITE_OTHER): Payer: Medicare Other | Admitting: Orthopedic Surgery

## 2014-06-30 ENCOUNTER — Encounter: Payer: Self-pay | Admitting: Orthopedic Surgery

## 2014-06-30 VITALS — BP 126/78 | Ht 60.0 in | Wt 244.0 lb

## 2014-06-30 DIAGNOSIS — M1710 Unilateral primary osteoarthritis, unspecified knee: Secondary | ICD-10-CM

## 2014-06-30 DIAGNOSIS — M129 Arthropathy, unspecified: Secondary | ICD-10-CM

## 2014-06-30 DIAGNOSIS — Z96659 Presence of unspecified artificial knee joint: Secondary | ICD-10-CM | POA: Diagnosis not present

## 2014-06-30 NOTE — Progress Notes (Signed)
Patient ID: Sharon [name], female   DOB: [birth year], 59 y.o.   MRN: [id number]  Chief Complaint  Patient presents with  . Follow-up    annual follow up + xrays bilateral knees TKA, RT 2013, LT 2012    HPI Sharon [name] is a 59 y.o. female.   Status post RIGHT TKA 2013/ LEFT 2012 total knee arthroplasty. The patient is doing well the knee implant is functioning well.  Review of Systems Review of Systems  FELL INJURED BACK BUT INJECTIONS AND NO BACK PAIN   Past Medical History  Diagnosis Date  . GERD (gastroesophageal reflux disease)   . Pneumonia   . Migraine   . Melanoma   . Carpal tunnel syndrome     bilateral  . Arthritis   . Degenerative disk disease     mainly back  . Fibrocystic breast disease 1996  . Depression   . Anxiety   . Tinnitus     Past Surgical History  Procedure Laterality Date  . Appendectomy    . Tonsillectomy    . Vesicovaginal fistula closure w/ tah    . Total knee arthroplasty  2008    left   . Total knee revision  2.12.2012    left  . Menisus repair on rt knee      bilateral  . Abdominal hysterectomy    . Tubal ligation    . Breast biopsy      right  . Total knee arthroplasty  06/17/2011    Procedure: TOTAL KNEE ARTHROPLASTY;  Surgeon: Arther Abbott, MD;  Location: [facility];  Service: Orthopedics;  Laterality: Right;  DePuy  . Colonoscopy       Allergies  Allergen Reactions  . Bee Venom Anaphylaxis  . Adhesive [Tape] Hives and Dermatitis    Can tolerate tegaderm  . Augmentin [Amoxicillin-Pot Clavulanate] Diarrhea and Nausea Only  . Latex Hives    Whelps  . Neomycin-Bacitracin Zn-Polymyx Dermatitis    Current Outpatient Prescriptions  Medication Sig Dispense Refill  . albuterol (PROVENTIL HFA;VENTOLIN HFA) 108 (90 BASE) MCG/ACT inhaler Inhale 2 puffs into the lungs every 6 (six) hours as needed. For shortness of breath 1 Inhaler 6  . ALPRAZolam (XANAX) 0.5 MG tablet TAKE ONE TABLET BY MOUTH TWICE DAILY AS NEEDED --  **MAY   FILL  MONTHLY** 30 tablet 2  . Calcium Carbonate-Vitamin D (CALCIUM + D) 600-200 MG-UNIT TABS Take 1 tablet by mouth 2 (two) times daily.     . fish oil-omega-3 fatty acids 1000 MG capsule Take 2 g by mouth at bedtime.     . furosemide (LASIX) 20 MG tablet Take 1 tablet (20 mg total) by mouth daily. 30 tablet 2  . HYDROcodone-acetaminophen (NORCO) 10-325 MG per tablet Take 1 tablet by mouth every 6 (six) hours as needed. 100 tablet 0  . KLOR-CON M10 10 MEQ tablet TAKE ONE TABLET BY MOUTH TWICE DAILY 60 tablet 0  . methocarbamol (ROBAXIN) 500 MG tablet TAKE ONE TABLET BY MOUTH EVERY 6 HOURS AS NEEDED FOR  BACK  PAIN 60 tablet 2  . Multiple Vitamin (MULITIVITAMIN WITH MINERALS) TABS Take 1 tablet by mouth at bedtime.    . nabumetone (RELAFEN) 750 MG tablet TAKE ONE TABLET BY MOUTH TWICE DAILY AS NEEDED FOR MODERATE PAIN 60 tablet 0  . pantoprazole (PROTONIX) 40 MG tablet TAKE ONE TABLET BY MOUTH ONCE DAILY FOR REFLUX 30 tablet 5   No current facility-administered medications for this visit.      Physical  Exam Height 5' (1.524 m), weight 244 lb (110.678 kg).  Overall appearance normal grooming and hygiene. The patient is awake alert and oriented 3 with a pleasant mood and affect. The patient is ambulatory without assistive device and without limping.  The RIGHT knee has no swelling or tenderness. The knee flexion arc is 120. The joint is stable in extension as well as flexion Motor exam for the extensor mechanism grade 5 Skin incision healed nicely  LEFT KNEE ARC IS 120 DEGREES EXTENSION ARC IS NORMAL   Data Reviewed Today's imaging shows stable total knee implant without loosening or complication IN THE RIGHT AND LEFT KNEE   Assessment    STABLE B/L TKA     Plan    ANNUALS         Arther Abbott 06/30/2014, 11:27 AM

## 2014-07-14 ENCOUNTER — Encounter: Payer: Self-pay | Admitting: Nurse Practitioner

## 2014-07-14 ENCOUNTER — Ambulatory Visit (INDEPENDENT_AMBULATORY_CARE_PROVIDER_SITE_OTHER): Payer: Medicare Other | Admitting: Nurse Practitioner

## 2014-07-14 VITALS — BP 140/88 | Temp 98.9°F | Ht 60.0 in | Wt 246.0 lb

## 2014-07-14 DIAGNOSIS — J069 Acute upper respiratory infection, unspecified: Secondary | ICD-10-CM

## 2014-07-14 DIAGNOSIS — B9689 Other specified bacterial agents as the cause of diseases classified elsewhere: Secondary | ICD-10-CM

## 2014-07-14 DIAGNOSIS — R062 Wheezing: Secondary | ICD-10-CM | POA: Diagnosis not present

## 2014-07-14 MED ORDER — AZITHROMYCIN 250 MG PO TABS: ORAL_TABLET | ORAL | Status: DC

## 2014-07-14 MED ORDER — ALBUTEROL SULFATE (2.5 MG/3ML) 0.083% IN NEBU
2.5000 mg | INHALATION_SOLUTION | Freq: Once | RESPIRATORY_TRACT | Status: AC
  Administered 2014-07-14: 2.5 mg via RESPIRATORY_TRACT

## 2014-07-14 MED ORDER — PREDNISONE 20 MG PO TABS: ORAL_TABLET | ORAL | Status: DC

## 2014-07-14 MED ORDER — ALBUTEROL SULFATE HFA 108 (90 BASE) MCG/ACT IN AERS: 2.0000 | INHALATION_SPRAY | RESPIRATORY_TRACT | Status: DC | PRN

## 2014-07-18 ENCOUNTER — Encounter: Payer: Self-pay | Admitting: Nurse Practitioner

## 2014-07-18 NOTE — Progress Notes (Signed)
Subjective:  Presents complaints of head congestion cough and wheezing for the past 4 days. No documented fever but some chills. Sore throat with cough. Facial area pressure/headache. Head congestion. Frequent nonproductive cough. Frequent wheezing, using her albuterol inhaler 4-5 times per day. No ear pain. No vomiting diarrhea or abdominal pain.  Objective:   BP 140/88 mmHg  Temp(Src) 98.9 F (37.2 C) (Oral)  Ht 5' (1.524 m)  Wt 246 lb (111.585 kg)  BMI 48.04 kg/m2 NAD. Alert, oriented. TMs clear effusion, no erythema. Pharynx mildly injected. Neck supple with mild soft anterior adenopathy. Lungs initially expiratory wheezes noted throughout lung fields. Given albuterol 2.5 mg neb treatment. Airflow much improved with rare faint expiratory wheeze. Subjective improvement of symptoms. Heart regular rate rhythm.  Assessment: Bacterial upper respiratory infection - Plan: albuterol (PROVENTIL) (2.5 MG/3ML) 0.083% nebulizer solution 2.5 mg  Wheezing - Plan: albuterol (PROVENTIL) (2.5 MG/3ML) 0.083% nebulizer solution 2.5 mg  Plan:  Meds ordered this encounter  Medications  . albuterol (PROVENTIL HFA;VENTOLIN HFA) 108 (90 BASE) MCG/ACT inhaler    Sig: Inhale 2 puffs into the lungs every 4 (four) hours as needed. For shortness of breath    Dispense:  1 Inhaler    Refill:  6    Order Specific Question:  Supervising Provider    Answer:  [name] [[id number]]  . predniSONE (DELTASONE) 20 MG tablet    Sig: 3 po qd x 3 d then 2 po qd x 3 d then 1 po qd x 3 d    Dispense:  18 tablet    Refill:  0    Order Specific Question:  Supervising Provider    Answer:  [name] [[id number]]  . azithromycin (ZITHROMAX Z-PAK) 250 MG tablet    Sig: Take 2 tablets (500 mg) on  Day 1,  followed by 1 tablet (250 mg) once daily on Days 2 through 5.    Dispense:  6 each    Refill:  0    Order Specific Question:  Supervising Provider    Answer:  [name] [[id number]]  . albuterol (PROVENTIL) (2.5 MG/3ML)  0.083% nebulizer solution 2.5 mg    Sig:    OTC meds as directed for congestion and cough. Callback in 4-5 days if no improvement, go to ED over the weekend if worse.

## 2014-07-29 ENCOUNTER — Ambulatory Visit (HOSPITAL_COMMUNITY)
Admission: RE | Admit: 2014-07-29 | Discharge: 2014-07-29 | Disposition: A | Discharge status: Home / Self Care | Payer: Medicare Other | Source: Ambulatory Visit | Attending: Nurse Practitioner | Admitting: Nurse Practitioner

## 2014-07-29 ENCOUNTER — Ambulatory Visit (INDEPENDENT_AMBULATORY_CARE_PROVIDER_SITE_OTHER): Payer: Medicare Other | Admitting: Nurse Practitioner

## 2014-07-29 ENCOUNTER — Encounter: Payer: Self-pay | Admitting: Nurse Practitioner

## 2014-07-29 VITALS — BP 124/82 | Temp 98.7°F | Ht 60.0 in | Wt 252.0 lb

## 2014-07-29 DIAGNOSIS — R079 Chest pain, unspecified: Secondary | ICD-10-CM | POA: Insufficient documentation

## 2014-07-29 DIAGNOSIS — J00 Acute nasopharyngitis [common cold]: Secondary | ICD-10-CM

## 2014-07-29 DIAGNOSIS — R062 Wheezing: Secondary | ICD-10-CM | POA: Insufficient documentation

## 2014-07-29 DIAGNOSIS — R0602 Shortness of breath: Secondary | ICD-10-CM | POA: Diagnosis not present

## 2014-07-29 DIAGNOSIS — R05 Cough: Secondary | ICD-10-CM | POA: Diagnosis not present

## 2014-07-29 DIAGNOSIS — J208 Acute bronchitis due to other specified organisms: Principal | ICD-10-CM

## 2014-07-29 DIAGNOSIS — B9689 Other specified bacterial agents as the cause of diseases classified elsewhere: Secondary | ICD-10-CM

## 2014-07-29 MED ORDER — PREDNISONE 20 MG PO TABS: ORAL_TABLET | ORAL | Status: DC

## 2014-07-29 MED ORDER — METHYLPREDNISOLONE ACETATE 40 MG/ML IJ SUSP
40.0000 mg | Freq: Once | INTRAMUSCULAR | Status: AC
  Administered 2014-07-29: 40 mg via INTRAMUSCULAR

## 2014-07-29 MED ORDER — BECLOMETHASONE DIPROPIONATE 80 MCG/ACT IN AERS: 1.0000 | INHALATION_SPRAY | Freq: Two times a day (BID) | RESPIRATORY_TRACT | Status: DC

## 2014-07-29 MED ORDER — ALBUTEROL SULFATE (2.5 MG/3ML) 0.083% IN NEBU
2.5000 mg | INHALATION_SOLUTION | Freq: Once | RESPIRATORY_TRACT | Status: AC
  Administered 2014-07-29: 2.5 mg via RESPIRATORY_TRACT

## 2014-07-29 MED ORDER — LEVOFLOXACIN 500 MG PO TABS: 500.0000 mg | ORAL_TABLET | Freq: Every day | ORAL | Status: DC

## 2014-07-29 MED ORDER — ALBUTEROL SULFATE (2.5 MG/3ML) 0.083% IN NEBU: INHALATION_SOLUTION | RESPIRATORY_TRACT | Status: DC

## 2014-07-29 MED ORDER — HYDROCODONE-HOMATROPINE 5-1.5 MG/5ML PO SYRP: 5.0000 mL | ORAL_SOLUTION | ORAL | Status: DC | PRN

## 2014-08-01 ENCOUNTER — Other Ambulatory Visit: Payer: Self-pay | Admitting: *Deleted

## 2014-08-01 MED ORDER — ALBUTEROL SULFATE HFA 108 (90 BASE) MCG/ACT IN AERS: 1.0000 | INHALATION_SPRAY | Freq: Four times a day (QID) | RESPIRATORY_TRACT | Status: DC | PRN

## 2014-08-02 ENCOUNTER — Encounter: Payer: Self-pay | Admitting: Nurse Practitioner

## 2014-08-02 NOTE — Progress Notes (Signed)
Subjective:   Presents for recheck. Last seen on 2/18 for bacterial URI. Took about 4 days of her prednisone taper before she saw relief from her wheezing which is unusual , usually sees improvement with and 2 -3 days. About 3 days ago her symptoms came back worse , severe wheezing requiring using her inhaler about every 4 hours. Some shortness of breath. Chest pain with cough. Frequent coughing.  Producing thick yellow mucus. Runny nose. No headache sore throat ear pain or fever. No acid reflux heartburn or abdominal pain. Does not have a nebulizer machine at home.  Objective:   BP 124/82 mmHg  Temp(Src) 98.7 F (37.1 C)  Ht 5' (1.524 m)  Wt 252 lb (114.306 kg)  BMI 49.22 kg/m2  SpO2 98%  NAD. Alert, oriented. Mild tachypnea with talking. TMs clear effusion, no erythema. Pharynx injected with PND noted. Neck supple with mild soft anterior adenopathy. Lungs initially diminished breath sounds in general with faint expiratory wheeze. Given albuterol 2.5 mg neb treatment, airflow much improved with faint expiratory wheezes noted throughout lung fields. Color normal limit. Subjective improvement of symptoms. Heart regular rate rhythm. Abdomen soft nontender.  Assessment: Acute bacterial bronchitis  Chest pain, unspecified chest pain type - Plan: DG Chest 2 View  Wheezing - Plan: methylPREDNISolone acetate (DEPO-MEDROL) injection 40 mg, albuterol (PROVENTIL) (2.5 MG/3ML) 0.083% nebulizer solution 2.5 mg  Plan:  Meds ordered this encounter  Medications  . HYDROcodone-homatropine (HYCODAN) 5-1.5 MG/5ML syrup    Sig: Take 5 mLs by mouth every 4 (four) hours as needed.    Dispense:  120 mL    Refill:  0    Order Specific Question:  Supervising Provider    Answer:  [name] [[id number]]  . levofloxacin (LEVAQUIN) 500 MG tablet    Sig: Take 1 tablet (500 mg total) by mouth daily.    Dispense:  10 tablet    Refill:  0    Order Specific Question:  Supervising Provider    Answer:  [name] [[id number]]  . predniSONE (DELTASONE) 20 MG tablet    Sig: 3 po qd x 3 d then 2 po qd x 3 d then 1 po qd x 3 d    Dispense:  18 tablet    Refill:  0    Order Specific Question:  Supervising Provider    Answer:  [name] [[id number]]  . beclomethasone (QVAR) 80 MCG/ACT inhaler    Sig: Inhale 1 puff into the lungs 2 (two) times daily.    Dispense:  1 Inhaler    Refill:  12    Order Specific Question:  Supervising Provider    Answer:  [name] [[id number]]  . albuterol (PROVENTIL) (2.5 MG/3ML) 0.083% nebulizer solution    Sig: Use one vial via neb q 4 hours prn wheezing    Dispense:  25 vial    Refill:  2    Order Specific Question:  Supervising Provider    Answer:  [name] [[id number]]  . methylPREDNISolone acetate (DEPO-MEDROL) injection 40 mg    Sig:   . albuterol (PROVENTIL) (2.5 MG/3ML) 0.083% nebulizer solution 2.5 mg    Sig:     explained risks associated with Levaquin and prednisone , in particular issues with tendons. Add Qvar to her regimen since this is the second major flareup in a few weeks and with spring season and weather change. Given prescription for nebulizer machine and equipment for home use. Warning signs reviewed. Chest x-ray  pending. Call back in 72 hours if no improvement, go to ED this weekend if worse.

## 2014-08-05 ENCOUNTER — Other Ambulatory Visit: Payer: Self-pay | Admitting: Family Medicine

## 2014-08-05 ENCOUNTER — Telehealth: Payer: Self-pay | Admitting: Family Medicine

## 2014-08-05 ENCOUNTER — Other Ambulatory Visit: Payer: Self-pay | Admitting: Nurse Practitioner

## 2014-08-05 NOTE — Telephone Encounter (Signed)
Rx prior auth DENIED for pt's beclomethasone (QVAR) 80 MCG/ACT inhaler, please see denial in yellow folder.  (plan may cover Flovent or Pulmicort as they were listed on PA form asking if pt had tried & failed but I found no documentation in Epic or paper chart that patient had used either in the past) also denial could have been for Dx of wheezing (no asthma documented in either chart)  Please advise

## 2014-08-09 DIAGNOSIS — Z85820 Personal history of malignant melanoma of skin: Secondary | ICD-10-CM | POA: Diagnosis not present

## 2014-08-09 DIAGNOSIS — Z1283 Encounter for screening for malignant neoplasm of skin: Secondary | ICD-10-CM | POA: Diagnosis not present

## 2014-08-09 DIAGNOSIS — Z08 Encounter for follow-up examination after completed treatment for malignant neoplasm: Secondary | ICD-10-CM | POA: Diagnosis not present

## 2014-08-10 ENCOUNTER — Other Ambulatory Visit: Payer: Self-pay | Admitting: Nurse Practitioner

## 2014-08-10 MED ORDER — FLUTICASONE PROPIONATE HFA 110 MCG/ACT IN AERO
1.0000 | INHALATION_SPRAY | Freq: Two times a day (BID) | RESPIRATORY_TRACT | Status: DC
Start: 2014-08-10 — End: 2014-11-01

## 2014-08-10 NOTE — Telephone Encounter (Signed)
Order sent in for Flovent

## 2014-08-18 ENCOUNTER — Ambulatory Visit (INDEPENDENT_AMBULATORY_CARE_PROVIDER_SITE_OTHER): Payer: Medicare Other | Admitting: Nurse Practitioner

## 2014-08-18 VITALS — BP 122/84 | Temp 98.4°F | Ht 60.0 in | Wt 252.0 lb

## 2014-08-18 DIAGNOSIS — R062 Wheezing: Secondary | ICD-10-CM | POA: Diagnosis not present

## 2014-08-18 MED ORDER — MELOXICAM 15 MG PO TABS: 15.0000 mg | ORAL_TABLET | Freq: Every day | ORAL | Status: DC

## 2014-08-19 ENCOUNTER — Ambulatory Visit: Payer: Medicare Other | Admitting: Nurse Practitioner

## 2014-08-19 ENCOUNTER — Encounter: Payer: Self-pay | Admitting: Nurse Practitioner

## 2014-08-19 NOTE — Progress Notes (Signed)
Subjective:  Presents for follow-up of bronchitis. Currently on Qvar which has greatly helped her symptoms. Cough much improved. Will need to switch to Flovent with next prescription since it is preferred area patient has a list of her preferred medications with her today, has been on Relafen which helps her arthritis pain does not bother her reflux but too expensive.  Objective:   BP 122/84 mmHg  Temp(Src) 98.4 F (36.9 C) (Oral)  Ht 5' (1.524 m)  Wt 252 lb (114.306 kg)  BMI 49.22 kg/m2 NAD. Alert, oriented. TMs mild clear effusion, no erythema. Pharynx clear. Neck supple with mild soft anterior adenopathy. Lungs clear. No tachypnea. Heart regular rate rhythm.  Assessment:  Wheezing resolving  Plan:  Meds ordered this encounter  Medications  . meloxicam (MOBIC) 15 MG tablet    Sig: Take 1 tablet (15 mg total) by mouth daily.    Dispense:  30 tablet    Refill:  5    Order Specific Question:  Supervising Provider    Answer:  [name] [[id number]]   Trial of meloxicam since this is a preferred drug on her list. DC med and call if any reflux symptoms. Return in about 3 months (around 11/18/2014) for physical. Routine labs also at that time.

## 2014-08-23 ENCOUNTER — Other Ambulatory Visit: Payer: Self-pay | Admitting: Nurse Practitioner

## 2014-09-09 ENCOUNTER — Other Ambulatory Visit: Payer: Self-pay | Admitting: Nurse Practitioner

## 2014-09-09 ENCOUNTER — Other Ambulatory Visit: Payer: Self-pay | Admitting: Family Medicine

## 2014-09-23 ENCOUNTER — Encounter: Payer: Self-pay | Admitting: Nurse Practitioner

## 2014-09-23 ENCOUNTER — Ambulatory Visit (INDEPENDENT_AMBULATORY_CARE_PROVIDER_SITE_OTHER): Payer: Medicare Other | Admitting: Nurse Practitioner

## 2014-09-23 VITALS — BP 120/88 | Temp 98.9°F | Ht 60.0 in | Wt 250.0 lb

## 2014-09-23 DIAGNOSIS — W5503XA Scratched by cat, initial encounter: Secondary | ICD-10-CM

## 2014-09-23 DIAGNOSIS — S60512A Abrasion of left hand, initial encounter: Secondary | ICD-10-CM | POA: Diagnosis not present

## 2014-09-23 MED ORDER — SULFAMETHOXAZOLE-TRIMETHOPRIM 800-160 MG PO TABS: 1.0000 | ORAL_TABLET | Freq: Two times a day (BID) | ORAL | Status: DC

## 2014-09-26 ENCOUNTER — Encounter: Payer: Self-pay | Admitting: Nurse Practitioner

## 2014-09-26 NOTE — Progress Notes (Signed)
Subjective:  Presents for c/o cat scratch that occurred yest. No fever. Swelling improved. Moderate tenderness. Tetanus vaccine UTD.   Objective:   BP 120/88 mmHg  Temp(Src) 98.9 F (37.2 C) (Oral)  Ht 5' (1.524 m)  Wt 250 lb (113.399 kg)  BMI 48.82 kg/m2 Two small puncture wounds back of left hand. Mild edema. Minimal erythema or warmth. Moderate tenderness to palpation.   Assessment: Cat scratch of hand, left, initial encounter  Plan:  Meds ordered this encounter  Medications  . sulfamethoxazole-trimethoprim (BACTRIM DS,SEPTRA DS) 800-160 MG per tablet    Sig: Take 1 tablet by mouth 2 (two) times daily.    Dispense:  14 tablet    Refill:  0    Order Specific Question:  Supervising Provider    Answer:  [name] [[id number]]   Warm compresses and elevation. Reviewed warning signs. Call back if any problems.

## 2014-10-14 ENCOUNTER — Other Ambulatory Visit: Payer: Self-pay | Admitting: Nurse Practitioner

## 2014-10-21 ENCOUNTER — Ambulatory Visit (INDEPENDENT_AMBULATORY_CARE_PROVIDER_SITE_OTHER): Payer: Medicare Other | Admitting: Nurse Practitioner

## 2014-10-21 ENCOUNTER — Encounter: Payer: Self-pay | Admitting: Nurse Practitioner

## 2014-10-21 VITALS — BP 148/82 | Ht 61.0 in | Wt 253.1 lb

## 2014-10-21 DIAGNOSIS — R5383 Other fatigue: Secondary | ICD-10-CM | POA: Diagnosis not present

## 2014-10-21 DIAGNOSIS — Z01419 Encounter for gynecological examination (general) (routine) without abnormal findings: Secondary | ICD-10-CM

## 2014-10-21 DIAGNOSIS — Z13820 Encounter for screening for osteoporosis: Secondary | ICD-10-CM | POA: Diagnosis not present

## 2014-10-21 DIAGNOSIS — Z0000 Encounter for general adult medical examination without abnormal findings: Secondary | ICD-10-CM

## 2014-10-24 ENCOUNTER — Encounter: Payer: Self-pay | Admitting: Nurse Practitioner

## 2014-10-24 NOTE — Progress Notes (Signed)
   Subjective:    Patient ID: Sharon [name], female    DOB: 1956/05/02, 59 y.o.   MRN: [id number]  HPI presents for her wellness exam. Same sexual partner. Still has both ovaries. Has had a pelvic US within past few years; ovaries were not visible. Sees dermatologist for skin cancer screening. Some fatigue. Regular vision and dental exams.     Review of Systems  Constitutional: Positive for fatigue. Negative for fever, activity change and appetite change.  HENT: Negative for dental problem, ear pain, sinus pressure and sore throat.   Respiratory: Negative for cough, chest tightness, shortness of breath and wheezing.   Cardiovascular: Negative for chest pain.  Gastrointestinal: Negative for nausea, vomiting, abdominal pain, diarrhea, constipation and abdominal distention.  Genitourinary: Negative for dysuria, urgency, frequency, vaginal bleeding, vaginal discharge, enuresis, difficulty urinating, genital sores and pelvic pain.       Objective:   Physical Exam  Constitutional: She is oriented to person, place, and time. She appears well-developed. No distress.  HENT:  Right Ear: External ear normal.  Left Ear: External ear normal.  Mouth/Throat: Oropharynx is clear and moist.  Neck: Normal range of motion. Neck supple. No tracheal deviation present. No thyromegaly present.  Cardiovascular: Normal rate, regular rhythm and normal heart sounds.  Exam reveals no gallop.   No murmur heard. Pulmonary/Chest: Effort normal and breath sounds normal.  Abdominal: Soft. She exhibits no distension. There is no tenderness.  Genitourinary:  Defers GU and rectal exams.   Musculoskeletal: She exhibits no edema.  Lymphadenopathy:    She has no cervical adenopathy.  Neurological: She is alert and oriented to person, place, and time.  Skin: Skin is warm and dry. No rash noted.  Psychiatric: She has a normal mood and affect. Her behavior is normal.  Vitals reviewed. Breast exam: no masses; axillae  no adenopathy.        Assessment & Plan:  Well woman exam - Plan: Basic metabolic panel, Hepatic function panel, Lipid panel  Other fatigue - Plan: CBC with Differential/Platelet, TSH  Screening for osteoporosis - Plan: DG Bone Density  Labs were not done last year; reordered today. Recommend activity as tolerated, weight loss; daily vitamin D and calcium supplementation. Patient to schedule her own colonoscopy.  Return in about 6 months (around 04/23/2015) for recheck.

## 2014-10-26 ENCOUNTER — Other Ambulatory Visit: Payer: Self-pay

## 2014-10-26 ENCOUNTER — Telehealth: Payer: Self-pay | Admitting: Family Medicine

## 2014-10-26 DIAGNOSIS — Z1231 Encounter for screening mammogram for malignant neoplasm of breast: Secondary | ICD-10-CM

## 2014-10-26 NOTE — Telephone Encounter (Signed)
Patient states she spoke with you(Carolyn) about getting medication from a new drug company Proventil (Pro-Air HFA) she doesnt need right now but Quav-(Spiriva or Flovent) she needs a prescription for this.

## 2014-10-27 DIAGNOSIS — R5383 Other fatigue: Secondary | ICD-10-CM | POA: Diagnosis not present

## 2014-10-27 DIAGNOSIS — Z0000 Encounter for general adult medical examination without abnormal findings: Secondary | ICD-10-CM | POA: Diagnosis not present

## 2014-10-27 NOTE — Telephone Encounter (Signed)
Patient wants rx sent in to [facility] for 90 day supply of  Flovent or Spiriva- Patient has never gotten this filled due to cost

## 2014-10-27 NOTE — Telephone Encounter (Signed)
Nurses please clarify. Also, I have refill requests from [facility] on my desk for several of her meds.

## 2014-10-28 ENCOUNTER — Ambulatory Visit (HOSPITAL_COMMUNITY)
Admission: RE | Admit: 2014-10-28 | Discharge: 2014-10-28 | Disposition: A | Discharge status: Home / Self Care | Payer: Medicare Other | Source: Ambulatory Visit | Attending: Nurse Practitioner | Admitting: Nurse Practitioner

## 2014-10-28 ENCOUNTER — Other Ambulatory Visit: Payer: Self-pay | Admitting: Nurse Practitioner

## 2014-10-28 DIAGNOSIS — Z780 Asymptomatic menopausal state: Secondary | ICD-10-CM | POA: Insufficient documentation

## 2014-10-28 DIAGNOSIS — Z13820 Encounter for screening for osteoporosis: Secondary | ICD-10-CM | POA: Insufficient documentation

## 2014-10-28 DIAGNOSIS — R29890 Loss of height: Secondary | ICD-10-CM | POA: Insufficient documentation

## 2014-10-28 LAB — CBC WITH DIFFERENTIAL/PLATELET
BASOS: 1 %
Basophils Absolute: 0.1 10*3/uL (ref 0.0–0.2)
EOS (ABSOLUTE): 0.3 10*3/uL (ref 0.0–0.4)
Eos: 4 %
HEMATOCRIT: 40.6 % (ref 34.0–46.6)
HEMOGLOBIN: 14.4 g/dL (ref 11.1–15.9)
Immature Grans (Abs): 0 10*3/uL (ref 0.0–0.1)
Immature Granulocytes: 0 %
LYMPHS ABS: 2.9 10*3/uL (ref 0.7–3.1)
LYMPHS: 37 %
MCH: 33.5 pg — AB (ref 26.6–33.0)
MCHC: 35.5 g/dL (ref 31.5–35.7)
MCV: 94 fL (ref 79–97)
MONOCYTES: 11 %
Monocytes Absolute: 0.9 10*3/uL (ref 0.1–0.9)
NEUTROS PCT: 47 %
Neutrophils Absolute: 3.6 10*3/uL (ref 1.4–7.0)
Platelets: 305 10*3/uL (ref 150–379)
RBC: 4.3 x10E6/uL (ref 3.77–5.28)
RDW: 13.3 % (ref 12.3–15.4)
WBC: 7.7 10*3/uL (ref 3.4–10.8)

## 2014-10-28 LAB — BASIC METABOLIC PANEL
BUN / CREAT RATIO: 23 (ref 11–26)
BUN: 19 mg/dL (ref 8–27)
CALCIUM: 10 mg/dL (ref 8.7–10.3)
CO2: 22 mmol/L (ref 18–29)
Chloride: 101 mmol/L (ref 97–108)
Creatinine, Ser: 0.83 mg/dL (ref 0.57–1.00)
GFR, EST AFRICAN AMERICAN: 89 mL/min/{1.73_m2} (ref >59)
GFR, EST NON AFRICAN AMERICAN: 77 mL/min/{1.73_m2} (ref >59)
GLUCOSE: 113 mg/dL — AB (ref 65–99)
Potassium: 4.3 mmol/L (ref 3.5–5.2)
Sodium: 140 mmol/L (ref 134–144)

## 2014-10-28 LAB — HEPATIC FUNCTION PANEL
ALT: 21 IU/L (ref 0–32)
AST: 22 IU/L (ref 0–40)
Albumin: 4.3 g/dL (ref 3.6–4.8)
Alkaline Phosphatase: 88 IU/L (ref 39–117)
BILIRUBIN, DIRECT: 0.11 mg/dL (ref 0.00–0.40)
Bilirubin Total: 0.4 mg/dL (ref 0.0–1.2)
TOTAL PROTEIN: 7 g/dL (ref 6.0–8.5)

## 2014-10-28 LAB — LIPID PANEL
Chol/HDL Ratio: 2.4 ratio units (ref 0.0–4.4)
Cholesterol, Total: 182 mg/dL (ref 100–199)
HDL: 76 mg/dL (ref >39)
LDL Calculated: 84 mg/dL (ref 0–99)
TRIGLYCERIDES: 110 mg/dL (ref 0–149)
VLDL CHOLESTEROL CAL: 22 mg/dL (ref 5–40)

## 2014-10-28 LAB — TSH: TSH: 4.25 u[IU]/mL (ref 0.450–4.500)

## 2014-10-28 MED ORDER — FUROSEMIDE 20 MG PO TABS: 20.0000 mg | ORAL_TABLET | Freq: Every day | ORAL | Status: DC

## 2014-10-28 MED ORDER — POTASSIUM CHLORIDE CRYS ER 10 MEQ PO TBCR: EXTENDED_RELEASE_TABLET | ORAL | Status: DC

## 2014-10-28 MED ORDER — PANTOPRAZOLE SODIUM 40 MG PO TBEC: DELAYED_RELEASE_TABLET | ORAL | Status: DC

## 2014-10-28 MED ORDER — NABUMETONE 750 MG PO TABS: ORAL_TABLET | ORAL | Status: DC

## 2014-10-31 NOTE — Telephone Encounter (Signed)
Discussed with pt she does not have a preference and she is not sure which one is cheaper. She states to just send in the one you prefer. Also she forgot to tell you at office visit that she needed refill on hydrocodone.

## 2014-10-31 NOTE — Telephone Encounter (Signed)
I have sent in her refills to [facility] on her other meds. Does she have a preference of which med? Which is less expensive?

## 2014-11-01 ENCOUNTER — Other Ambulatory Visit: Payer: Self-pay | Admitting: Nurse Practitioner

## 2014-11-01 DIAGNOSIS — M5137 Other intervertebral disc degeneration, lumbosacral region: Secondary | ICD-10-CM

## 2014-11-01 MED ORDER — FLUTICASONE PROPIONATE HFA 110 MCG/ACT IN AERO: 1.0000 | INHALATION_SPRAY | Freq: Two times a day (BID) | RESPIRATORY_TRACT | Status: DC

## 2014-11-01 MED ORDER — HYDROCODONE-ACETAMINOPHEN 10-325 MG PO TABS: 1.0000 | ORAL_TABLET | Freq: Four times a day (QID) | ORAL | Status: DC | PRN

## 2014-11-01 NOTE — Telephone Encounter (Signed)
Sent in [place] 90 d supply. Will send pain med Rx to printer and sign in the morning. Thanks.

## 2014-11-01 NOTE — Telephone Encounter (Signed)
Discussed with pt. Pt notified script can be picked up tomorrow.

## 2014-11-23 ENCOUNTER — Ambulatory Visit
Admission: RE | Admit: 2014-11-23 | Discharge: 2014-11-23 | Disposition: A | Discharge status: Home / Self Care | Payer: Medicare Other | Source: Ambulatory Visit

## 2014-11-23 DIAGNOSIS — Z1231 Encounter for screening mammogram for malignant neoplasm of breast: Secondary | ICD-10-CM

## 2014-12-10 ENCOUNTER — Other Ambulatory Visit: Payer: Self-pay | Admitting: Nurse Practitioner

## 2014-12-12 ENCOUNTER — Telehealth: Payer: Self-pay

## 2014-12-12 NOTE — Telephone Encounter (Signed)
PT's daughter left a VM that she needs to schedule her mom's colonoscopy because they have to go by her work schedule.  Per out records pt's last colonoscopy was 08/08/2005 and is not due til 07/2015.

## 2014-12-13 NOTE — Telephone Encounter (Signed)
I called pt's daughter, Kathi Der. Her mom is not having any problems , she just thought it was time to have colonoscoy. She is on recall for 07/2015 . ( Last done 07/2005) They will wait until time or call if problems before then.

## 2014-12-13 NOTE — Telephone Encounter (Signed)
Noted  

## 2014-12-29 ENCOUNTER — Ambulatory Visit (INDEPENDENT_AMBULATORY_CARE_PROVIDER_SITE_OTHER): Payer: Medicare Other | Admitting: Nurse Practitioner

## 2014-12-29 ENCOUNTER — Encounter: Payer: Self-pay | Admitting: Nurse Practitioner

## 2014-12-29 VITALS — BP 128/80 | Temp 99.1°F | Ht 61.0 in | Wt 254.2 lb

## 2014-12-29 DIAGNOSIS — J329 Chronic sinusitis, unspecified: Secondary | ICD-10-CM | POA: Diagnosis not present

## 2014-12-29 MED ORDER — PREDNISONE 20 MG PO TABS: ORAL_TABLET | ORAL | Status: DC

## 2014-12-29 MED ORDER — LEVOFLOXACIN 500 MG PO TABS: 500.0000 mg | ORAL_TABLET | Freq: Every day | ORAL | Status: DC

## 2014-12-31 ENCOUNTER — Encounter: Payer: Self-pay | Admitting: Nurse Practitioner

## 2014-12-31 NOTE — Progress Notes (Signed)
Subjective:  Presents complaints of congestion and cough for the past 7 days. Low-grade fever max temp 99.1. Sore throat. Generalized headache. Runny nose. Frequent cough producing green/yellow sputum. Wheezing especially at nighttime, better with use of albuterol. Using either her nebulizer or inhaler about 4 times per day. Ear pressure. Taking fluids well. Voiding normal limit.  Objective:   BP 128/80 mmHg  Temp(Src) 99.1 F (37.3 C) (Oral)  Ht 5\' 1"  (1.549 m)  Wt 254 lb 4 oz (115.327 kg)  BMI 48.06 kg/m2 NAD. Alert, oriented. TMs retracted bilateral, no erythema. Pharynx injected with green PND noted. Neck supple with mild soft anterior adenopathy. Lungs clear. Heart regular rate rhythm.  Assessment: Rhinosinusitis  Plan:  Meds ordered this encounter  Medications  . predniSONE (DELTASONE) 20 MG tablet    Sig: 3 po qd x 3 d then 2 po qd x 3 d then 1 po qd x 3 d    Dispense:  18 tablet    Refill:  0    Order Specific Question:  Supervising Provider    Answer:  [name] [[id number]]  . levofloxacin (LEVAQUIN) 500 MG tablet    Sig: Take 1 tablet (500 mg total) by mouth daily.    Dispense:  10 tablet    Refill:  0    Order Specific Question:  Supervising Provider    Answer:  [name] [[id number]]   Reviewed potential risk associated with Levaquin use. Continue OTC meds as directed for congestion. Continue albuterol as directed. Call back in 4-5 days if no improvement, call or go to ED sooner if worse.

## 2015-02-21 DIAGNOSIS — L304 Erythema intertrigo: Secondary | ICD-10-CM | POA: Diagnosis not present

## 2015-02-21 DIAGNOSIS — D2261 Melanocytic nevi of right upper limb, including shoulder: Secondary | ICD-10-CM | POA: Diagnosis not present

## 2015-02-21 DIAGNOSIS — L820 Inflamed seborrheic keratosis: Secondary | ICD-10-CM | POA: Diagnosis not present

## 2015-03-19 ENCOUNTER — Emergency Department (HOSPITAL_COMMUNITY)
Admission: EM | Admit: 2015-03-19 | Discharge: 2015-03-19 | Disposition: A | Discharge status: Home / Self Care | Payer: Medicare Other | Attending: Emergency Medicine | Admitting: Emergency Medicine

## 2015-03-19 ENCOUNTER — Emergency Department (HOSPITAL_COMMUNITY): Payer: Medicare Other

## 2015-03-19 ENCOUNTER — Encounter (HOSPITAL_COMMUNITY): Payer: Self-pay | Admitting: *Deleted

## 2015-03-19 ENCOUNTER — Other Ambulatory Visit: Payer: Self-pay

## 2015-03-19 DIAGNOSIS — Z91040 Latex allergy status: Secondary | ICD-10-CM | POA: Diagnosis not present

## 2015-03-19 DIAGNOSIS — G43909 Migraine, unspecified, not intractable, without status migrainosus: Secondary | ICD-10-CM | POA: Insufficient documentation

## 2015-03-19 DIAGNOSIS — F329 Major depressive disorder, single episode, unspecified: Secondary | ICD-10-CM | POA: Insufficient documentation

## 2015-03-19 DIAGNOSIS — Z8701 Personal history of pneumonia (recurrent): Secondary | ICD-10-CM | POA: Diagnosis not present

## 2015-03-19 DIAGNOSIS — Z792 Long term (current) use of antibiotics: Secondary | ICD-10-CM | POA: Insufficient documentation

## 2015-03-19 DIAGNOSIS — Z87891 Personal history of nicotine dependence: Secondary | ICD-10-CM | POA: Insufficient documentation

## 2015-03-19 DIAGNOSIS — Z79899 Other long term (current) drug therapy: Secondary | ICD-10-CM | POA: Diagnosis not present

## 2015-03-19 DIAGNOSIS — Z8742 Personal history of other diseases of the female genital tract: Secondary | ICD-10-CM | POA: Insufficient documentation

## 2015-03-19 DIAGNOSIS — M1990 Unspecified osteoarthritis, unspecified site: Secondary | ICD-10-CM | POA: Diagnosis not present

## 2015-03-19 DIAGNOSIS — K219 Gastro-esophageal reflux disease without esophagitis: Secondary | ICD-10-CM | POA: Insufficient documentation

## 2015-03-19 DIAGNOSIS — Z85820 Personal history of malignant melanoma of skin: Secondary | ICD-10-CM | POA: Insufficient documentation

## 2015-03-19 DIAGNOSIS — F419 Anxiety disorder, unspecified: Secondary | ICD-10-CM | POA: Diagnosis not present

## 2015-03-19 DIAGNOSIS — M549 Dorsalgia, unspecified: Secondary | ICD-10-CM

## 2015-03-19 DIAGNOSIS — Z791 Long term (current) use of non-steroidal anti-inflammatories (NSAID): Secondary | ICD-10-CM | POA: Diagnosis not present

## 2015-03-19 DIAGNOSIS — M546 Pain in thoracic spine: Secondary | ICD-10-CM | POA: Insufficient documentation

## 2015-03-19 DIAGNOSIS — Z7951 Long term (current) use of inhaled steroids: Secondary | ICD-10-CM | POA: Diagnosis not present

## 2015-03-19 LAB — CBC WITH DIFFERENTIAL/PLATELET
BASOS ABS: 0 10*3/uL (ref 0.0–0.1)
Basophils Relative: 1 %
EOS ABS: 0.3 10*3/uL (ref 0.0–0.7)
EOS PCT: 4 %
HCT: 38.6 % (ref 36.0–46.0)
Hemoglobin: 13 g/dL (ref 12.0–15.0)
LYMPHS ABS: 3.5 10*3/uL (ref 0.7–4.0)
LYMPHS PCT: 41 %
MCH: 32.3 pg (ref 26.0–34.0)
MCHC: 33.7 g/dL (ref 30.0–36.0)
MCV: 95.8 fL (ref 78.0–100.0)
MONO ABS: 1 10*3/uL (ref 0.1–1.0)
Monocytes Relative: 13 %
Neutro Abs: 3.4 10*3/uL (ref 1.7–7.7)
Neutrophils Relative %: 41 %
PLATELETS: 238 10*3/uL (ref 150–400)
RBC: 4.03 MIL/uL (ref 3.87–5.11)
RDW: 13 % (ref 11.5–15.5)
WBC: 8.2 10*3/uL (ref 4.0–10.5)

## 2015-03-19 LAB — COMPREHENSIVE METABOLIC PANEL
ALT: 18 U/L (ref 14–54)
AST: 20 U/L (ref 15–41)
Albumin: 3.9 g/dL (ref 3.5–5.0)
Alkaline Phosphatase: 72 U/L (ref 38–126)
Anion gap: 6 (ref 5–15)
BUN: 24 mg/dL — ABNORMAL HIGH (ref 6–20)
CHLORIDE: 106 mmol/L (ref 101–111)
CO2: 26 mmol/L (ref 22–32)
Calcium: 9 mg/dL (ref 8.9–10.3)
Creatinine, Ser: 0.65 mg/dL (ref 0.44–1.00)
Glucose, Bld: 84 mg/dL (ref 65–99)
POTASSIUM: 3.8 mmol/L (ref 3.5–5.1)
SODIUM: 138 mmol/L (ref 135–145)
Total Bilirubin: 1 mg/dL (ref 0.3–1.2)
Total Protein: 7.1 g/dL (ref 6.5–8.1)

## 2015-03-19 LAB — URINALYSIS, ROUTINE W REFLEX MICROSCOPIC
BILIRUBIN URINE: NEGATIVE
GLUCOSE, UA: NEGATIVE mg/dL
Hgb urine dipstick: NEGATIVE
KETONES UR: NEGATIVE mg/dL
LEUKOCYTES UA: NEGATIVE
NITRITE: NEGATIVE
Protein, ur: NEGATIVE mg/dL
Specific Gravity, Urine: 1.025 (ref 1.005–1.030)
Urobilinogen, UA: 0.2 mg/dL (ref 0.0–1.0)
pH: 5.5 (ref 5.0–8.0)

## 2015-03-19 LAB — LIPASE, BLOOD: LIPASE: 40 U/L (ref 11–51)

## 2015-03-19 LAB — TROPONIN I

## 2015-03-19 MED ORDER — DIAZEPAM 5 MG PO TABS: 5.0000 mg | ORAL_TABLET | Freq: Two times a day (BID) | ORAL | Status: DC

## 2015-03-19 MED ORDER — HYDROMORPHONE HCL 1 MG/ML IJ SOLN
1.0000 mg | Freq: Once | INTRAMUSCULAR | Status: AC
  Administered 2015-03-19: 1 mg via INTRAVENOUS
  Filled 2015-03-19: qty 1

## 2015-03-19 MED ORDER — ONDANSETRON HCL 4 MG/2ML IJ SOLN
4.0000 mg | Freq: Once | INTRAMUSCULAR | Status: AC
Start: 2015-03-19 — End: 2015-03-19
  Administered 2015-03-19: 4 mg via INTRAVENOUS
  Filled 2015-03-19: qty 2

## 2015-03-19 MED ORDER — MORPHINE SULFATE (PF) 4 MG/ML IV SOLN
4.0000 mg | Freq: Once | INTRAVENOUS | Status: AC
  Administered 2015-03-19: 4 mg via INTRAVENOUS
  Filled 2015-03-19: qty 1

## 2015-03-19 MED ORDER — DIAZEPAM 5 MG PO TABS
5.0000 mg | ORAL_TABLET | Freq: Once | ORAL | Status: AC
  Administered 2015-03-19: 5 mg via ORAL
  Filled 2015-03-19: qty 1

## 2015-03-19 NOTE — ED Provider Notes (Signed)
CSN: [id number]     Arrival date & time 03/19/15  0615 History   First MD Initiated Contact with Patient 03/19/15 0703     Chief Complaint  Patient presents with  . Back Pain     (Consider location/radiation/quality/duration/timing/severity/associated sxs/prior Treatment) HPI  Pt presenting with c/o upper back pain.  She states pain is on both sides below her shoulder blades.  She states pain started acutely at 6pm last night while she was standing and decorating a birthday cake.  Pain is worse with palpation.  She states she can feel the muscles cramping and spasming.  She took robaxin, xanax and hydrocodone at 1am and did not get any relief from the pain.  No chest pain.  No difficulty breathing.  No fever/chills.  No leg swelling.  No hx of DVT/PE.  No recent travel/trauma/surgery.  No retention of urine, no incontinence of bowel or bladder.  No weakness of arms or legs.  There are no other associated systemic symptoms, there are no other alleviating or modifying factors.   Past Medical History  Diagnosis Date  . GERD (gastroesophageal reflux disease)   . Pneumonia   . Migraine   . Melanoma ([place])   . Carpal tunnel syndrome     bilateral  . Arthritis   . Degenerative disk disease     mainly back  . Fibrocystic breast disease 1996  . Depression   . Anxiety   . Tinnitus    Past Surgical History  Procedure Laterality Date  . Appendectomy    . Tonsillectomy    . Vesicovaginal fistula closure w/ tah    . Total knee arthroplasty  2008    left   . Total knee revision  2.12.2012    left  . Menisus repair on rt knee      bilateral  . Abdominal hysterectomy    . Tubal ligation    . Breast biopsy      right  . Total knee arthroplasty  06/17/2011    Procedure: TOTAL KNEE ARTHROPLASTY;  Surgeon: Arther Abbott, MD;  Location: [facility];  Service: Orthopedics;  Laterality: Right;  [place]uy  . Colonoscopy     Family History  Problem Relation Age of Onset  . Cancer      family  history   . Lung disease      family history   . Anesthesia problems Neg Hx   . Hypotension Neg Hx   . Malignant hyperthermia Neg Hx   . Pseudochol deficiency Neg Hx   . Hypertension Father   . Cancer Father 50    lung cancer   Social History  Substance Use Topics  . Smoking status: Former Smoker -- 1.00 packs/day for 20 years    Types: Cigarettes    Start date: 08/02/1972    Quit date: 05/27/2006  . Smokeless tobacco: Never Used  . Alcohol Use: 0.0 oz/week    0 Standard drinks or equivalent per week     Comment: 1 glass occassionally on holidays (about once a month)   OB History    No data available     Review of Systems  ROS reviewed and all otherwise negative except for mentioned in HPI    Allergies  Bee venom; Adhesive; Augmentin; Latex; and Neomycin-bacitracin zn-polymyx  Home Medications   Prior to Admission medications   Medication Sig Start Date End Date Taking? Authorizing Provider  albuterol (PROAIR HFA) 108 (90 BASE) MCG/ACT inhaler Inhale 1-2 puffs into the lungs every 6 (  six) hours as needed for wheezing or shortness of breath. 08/01/14   [name], MD  albuterol (PROVENTIL) (2.5 MG/3ML) 0.083% nebulizer solution Use one vial via neb q 4 hours prn wheezing 07/29/14   Nilda Simmer, NP  ALPRAZolam Duanne Moron) 0.5 MG tablet TAKE ONE TABLET BY MOUTH TWICE DAILY AS NEEDED --  **MAY  FILL  MONTHLY** 09/17/13   Nilda Simmer, NP  Calcium Carbonate-Vitamin D (CALCIUM + D) 600-200 MG-UNIT TABS Take 1 tablet by mouth 2 (two) times daily.     Historical Provider, MD  diazepam (VALIUM) 5 MG tablet Take 1 tablet (5 mg total) by mouth 2 (two) times daily. 03/19/15   Alfonzo Beers, MD  fish oil-omega-3 fatty acids 1000 MG capsule Take 2 g by mouth at bedtime.     Historical Provider, MD  fluticasone (FLOVENT HFA) 110 MCG/ACT inhaler Inhale 1 puff into the lungs 2 (two) times daily. 11/01/14   Nilda Simmer, NP  furosemide (LASIX) 20 MG tablet Take 1 tablet (20 mg  total) by mouth daily. 10/28/14   Nilda Simmer, NP  HYDROcodone-acetaminophen (NORCO) 10-325 MG per tablet Take 1 tablet by mouth every 6 (six) hours as needed. 11/01/14   Nilda Simmer, NP  levofloxacin (LEVAQUIN) 500 MG tablet Take 1 tablet (500 mg total) by mouth daily. 12/29/14   Nilda Simmer, NP  meloxicam (MOBIC) 15 MG tablet Take 1 tablet (15 mg total) by mouth daily. 08/18/14   Nilda Simmer, NP  methocarbamol (ROBAXIN) 500 MG tablet TAKE ONE TABLET BY MOUTH EVERY 6 HOURS AS NEEDED FOR  BACK  PAIN 01/19/14   Nilda Simmer, NP  Multiple Vitamin (MULITIVITAMIN WITH MINERALS) TABS Take 1 tablet by mouth at bedtime.    Historical Provider, MD  nabumetone (RELAFEN) 750 MG tablet TAKE ONE TABLET BY MOUTH TWICE DAILY AS NEEDED FOR MODERATE PAIN 10/28/14   Nilda Simmer, NP  nabumetone (RELAFEN) 750 MG tablet One po BID prn pain 12/12/14   Nilda Simmer, NP  pantoprazole (PROTONIX) 40 MG tablet TAKE ONE TABLET BY MOUTH ONCE DAILY FOR  REFLUX 10/28/14   Nilda Simmer, NP  phentermine (ADIPEX-P) 37.5 MG tablet TAKE ONE TABLET BY MOUTH ONCE DAILY BEFORE BREAKFAST 08/23/14   Nilda Simmer, NP  potassium chloride (KLOR-CON M10) 10 MEQ tablet TAKE ONE TABLET BY MOUTH TWICE DAILY 10/28/14   Nilda Simmer, NP  predniSONE (DELTASONE) 20 MG tablet 3 po qd x 3 d then 2 po qd x 3 d then 1 po qd x 3 d 12/29/14   Nilda Simmer, NP   BP 132/63 mmHg  Pulse 75  Temp(Src) 98 F (36.7 C) (Oral)  Resp 12  Ht 5' (1.524 m)  Wt 250 lb (113.399 kg)  BMI 48.82 kg/m2  SpO2 97%  Vitals reviewed Physical Exam  Physical Examination: General appearance - alert, well appearing, and in no distress Mental status - alert, oriented to person, place, and time Eyes - no conjunctival injection, no scleral icterus Mouth - mucous membranes moist, pharynx normal without lesions Chest - clear to auscultation, no wheezes, rales or rhonchi, symmetric air entry Heart - normal rate, regular rhythm, normal  S1, S2, no murmurs, rubs, clicks or gallops Abdomen - soft, nontender, nondistended, no masses or organomegaly Back exam - no midline tenderness to palpation, bilateral thoracic paraspinal tenderness to palpation, no CVA tenderness Neurological - alert, oriented, normal speech, strength 5/5 in extremities x 4, sensation intact  Extremities - peripheral pulses normal, no pedal edema, no clubbing or cyanosis Skin - normal coloration and turgor, no rashes  ED Course  Procedures (including critical care time)  ED ECG REPORT   Date: 03/19/2015  Rate: 72  Rhythm: normal sinus rhythm  QRS Axis: normal  Intervals: normal  ST/T Wave abnormalities: normal  Conduction Disutrbances: none  Narrative Interpretation: unremarkable     Labs Review Labs Reviewed  URINALYSIS, ROUTINE W REFLEX MICROSCOPIC (NOT AT [facility]) - Abnormal; Notable for the following:    APPearance HAZY (*)    All other components within normal limits  COMPREHENSIVE METABOLIC PANEL - Abnormal; Notable for the following:    BUN 24 (*)    All other components within normal limits  CBC WITH DIFFERENTIAL/PLATELET  TROPONIN I  LIPASE, BLOOD    Imaging Review Dg Chest 2 View  03/19/2015  CLINICAL DATA:  Lower thoracic spine pain radiating around side of chest. EXAM: CHEST  2 VIEW COMPARISON:  07/29/2014 FINDINGS: Lungs are somewhat hypoinflated but otherwise clear. Cardiomediastinal silhouette is within normal. There are mild degenerate changes of the spine. IMPRESSION: Hypoinflation without acute cardiopulmonary disease. Electronically Signed   By: Marin Olp M.D.   On: 03/19/2015 07:54   I have personally reviewed and evaluated these images and lab results as part of my medical decision-making.   EKG Interpretation None      MDM   Final diagnoses:  Upper back pain    Pt presenting with bilateral upper back pain most c/w muscle spasms.  Pain is reproducible with palpation.  Labs including CXR are reassuring.   Doubt pulmonary or cardiac cause of her symptoms.  Pt is feeling improved especially after valium- will give rx for valium.  Pt counseled not to use with her xanax.  Discharged with strict return precautions.  Pt agreeable with plan.    Alfonzo Beers, MD 03/19/15 1001

## 2015-03-19 NOTE — Discharge Instructions (Signed)
Return to the ED with any concerns including weakness of arms or legs, fever/chills, decreased level of alertness/lethargy, or any other alarming symptoms  When taking valium you should not take xanax as these medications are too similar

## 2015-03-19 NOTE — ED Notes (Signed)
Pt c/o mid center pulsating back pain that started yesterday, denies any sob, nausea, states that she has problems with her back but this pain is different, has taken her normal muscle relaxer's and pain medication without improvement in pain,

## 2015-04-03 ENCOUNTER — Other Ambulatory Visit: Payer: Self-pay | Admitting: Nurse Practitioner

## 2015-07-04 ENCOUNTER — Ambulatory Visit: Payer: Medicare Other | Admitting: Orthopedic Surgery

## 2015-07-11 ENCOUNTER — Telehealth: Payer: Self-pay | Admitting: Internal Medicine

## 2015-07-11 NOTE — Telephone Encounter (Signed)
RECALL FOR TCS °

## 2015-07-12 NOTE — Telephone Encounter (Signed)
Letter mailed to pt.  

## 2015-07-17 ENCOUNTER — Ambulatory Visit: Payer: PPO

## 2015-07-17 ENCOUNTER — Ambulatory Visit (INDEPENDENT_AMBULATORY_CARE_PROVIDER_SITE_OTHER): Payer: PPO | Admitting: Orthopedic Surgery

## 2015-07-17 ENCOUNTER — Encounter: Payer: Self-pay | Admitting: Orthopedic Surgery

## 2015-07-17 ENCOUNTER — Ambulatory Visit (INDEPENDENT_AMBULATORY_CARE_PROVIDER_SITE_OTHER): Payer: PPO

## 2015-07-17 VITALS — BP 156/89 | Ht 60.0 in | Wt 262.0 lb

## 2015-07-17 DIAGNOSIS — M1710 Unilateral primary osteoarthritis, unspecified knee: Secondary | ICD-10-CM

## 2015-07-17 DIAGNOSIS — M129 Arthropathy, unspecified: Secondary | ICD-10-CM

## 2015-07-17 DIAGNOSIS — M25562 Pain in left knee: Secondary | ICD-10-CM | POA: Diagnosis not present

## 2015-07-17 DIAGNOSIS — M25561 Pain in right knee: Secondary | ICD-10-CM | POA: Diagnosis not present

## 2015-07-17 NOTE — Progress Notes (Signed)
Patient ID: Sharon [name], female   DOB: 08-19-1955, 60 y.o.   MRN: [id number]  Post op annual TKA   Chief Complaint  Patient presents with  . Follow-up    1 year follow up + XRAY bilateral TKA, RT 06/17/11, LT 2012    HPI Sharon [name] is a 60 y.o. female.  Left knee 5 years out right knee for years out. The patient has no symptoms in either knee. She is ambulatory without assistive devices. She has some mild discomfort relieved by anti-inflammatories if she overdoes it. There is no associated numbness tingling locking or swelling.     Past Medical History  Diagnosis Date  . GERD (gastroesophageal reflux disease)   . Pneumonia   . Migraine   . Melanoma ([place])   . Carpal tunnel syndrome     bilateral  . Arthritis   . Degenerative disk disease     mainly back  . Fibrocystic breast disease 1996  . Depression   . Anxiety   . Tinnitus     Past Surgical History  Procedure Laterality Date  . Appendectomy    . Tonsillectomy    . Vesicovaginal fistula closure w/ tah    . Total knee arthroplasty  2008    left   . Total knee revision  2.12.2012    left  . Menisus repair on rt knee      bilateral  . Abdominal hysterectomy    . Tubal ligation    . Breast biopsy      right  . Total knee arthroplasty  06/17/2011    Procedure: TOTAL KNEE ARTHROPLASTY;  Surgeon: Arther Abbott, MD;  Location: [facility];  Service: Orthopedics;  Laterality: Right;  DePuy  . Colonoscopy       Allergies  Allergen Reactions  . Bee Venom Anaphylaxis  . Adhesive [Tape] Hives and Dermatitis    Can tolerate tegaderm  . Augmentin [Amoxicillin-Pot Clavulanate] Diarrhea and Nausea Only  . Latex Hives    [place]  . Neomycin-Bacitracin Zn-Polymyx Dermatitis    Current Outpatient Prescriptions  Medication Sig Dispense Refill  . albuterol (PROVENTIL) (2.5 MG/3ML) 0.083% nebulizer solution Use one vial via neb q 4 hours prn wheezing 25 vial 2  . ALPRAZolam (XANAX) 0.5 MG tablet TAKE ONE TABLET BY  MOUTH TWICE DAILY AS NEEDED --  **MAY  FILL  MONTHLY** 30 tablet 2  . Calcium Carbonate-Vitamin D (CALCIUM + D) 600-200 MG-UNIT TABS Take 1 tablet by mouth 2 (two) times daily.     . fish oil-omega-3 fatty acids 1000 MG capsule Take 2 g by mouth at bedtime.     . fluticasone (FLOVENT HFA) 110 MCG/ACT inhaler Inhale 1 puff into the lungs 2 (two) times daily. 3 Inhaler 3  . furosemide (LASIX) 20 MG tablet Take 1 tablet by mouth  daily 90 tablet 0  . HYDROcodone-acetaminophen (NORCO) 10-325 MG per tablet Take 1 tablet by mouth every 6 (six) hours as needed. 100 tablet 0  . methocarbamol (ROBAXIN) 500 MG tablet TAKE ONE TABLET BY MOUTH EVERY 6 HOURS AS NEEDED FOR  BACK  PAIN 60 tablet 2  . Multiple Vitamin (MULITIVITAMIN WITH MINERALS) TABS Take 1 tablet by mouth at bedtime.    . nabumetone (RELAFEN) 750 MG tablet TAKE ONE TABLET BY MOUTH TWICE DAILY AS NEEDED FOR MODERATE PAIN 180 tablet 1  . pantoprazole (PROTONIX) 40 MG tablet TAKE ONE TABLET BY MOUTH  ONCE DAILY FOR REFLUX 90 tablet 0  . potassium chloride (K-DUR,KLOR-CON)  10 MEQ tablet Take 1 tablet by mouth  twice a day 180 tablet 0   No current facility-administered medications for this visit.    Review of Systems Review of Systems No fever chills or constitutional symptoms. Musculoskeletal symptoms joint pain is stable without any complaints and there is no back pain. Denies numbness or tingling unsteady gait  Physical Exam Blood pressure 156/89, height 5' (1.524 m), weight 262 lb (118.842 kg).   Gen. appearance is normal there are no congenital abnormalities   The patient is oriented 3   Mood and affect are normal   Ambulation is without assistive device   Bilateral knee examination lower extremity examination  Knee inspection reveals a well-healed incision with no swelling   Knee flexion right 115 left 110     Stability in the anteroposterior plane is normal as well as in the medial lateral plane  Motor exam reveals  full extension without extensor lag  Neurovascular exam is normal in each limb Data Reviewed KNEE XRAYS : Bilateral knee films show stable prosthesis  Assessment S/P TKA   Plan    Right and left knee replacement stable follow-up in a year       Arther Abbott 07/17/2015, 11:00 AM

## 2015-07-20 ENCOUNTER — Other Ambulatory Visit: Payer: Self-pay | Admitting: Family Medicine

## 2015-07-24 ENCOUNTER — Encounter: Payer: Self-pay | Admitting: Family Medicine

## 2015-07-24 ENCOUNTER — Ambulatory Visit (INDEPENDENT_AMBULATORY_CARE_PROVIDER_SITE_OTHER): Payer: PPO | Admitting: Family Medicine

## 2015-07-24 VITALS — BP 122/86 | Temp 98.9°F | Ht 60.0 in | Wt 264.0 lb

## 2015-07-24 DIAGNOSIS — R062 Wheezing: Secondary | ICD-10-CM

## 2015-07-24 DIAGNOSIS — J329 Chronic sinusitis, unspecified: Secondary | ICD-10-CM | POA: Diagnosis not present

## 2015-07-24 MED ORDER — ALBUTEROL SULFATE (2.5 MG/3ML) 0.083% IN NEBU: INHALATION_SOLUTION | RESPIRATORY_TRACT | Status: DC

## 2015-07-24 MED ORDER — CEFPROZIL 500 MG PO TABS: 500.0000 mg | ORAL_TABLET | Freq: Two times a day (BID) | ORAL | Status: DC

## 2015-07-24 MED ORDER — PREDNISONE 20 MG PO TABS: ORAL_TABLET | ORAL | Status: DC

## 2015-07-24 NOTE — Progress Notes (Signed)
   Subjective:    Patient ID: Sharon [name], female    DOB: [birth year], 60 y.o.   MRN: [id number]  Cough This is a new problem. Episode onset: 7 days. Associated symptoms include nasal congestion and wheezing. Treatments tried: otc meds.    symptoms over the past few weeks  PMH benign  Review of Systems  Respiratory: Positive for cough and wheezing.    congestion drainage and no vomiting or diarrhea     Objective:   Physical Exam  some expiratory wheezes noted. Eardrums normal. Neck is supple not respiratory distress  moderate sinus tenderness      Assessment & Plan:   viral syndrome Asthma flareup Secondary sinusitis Antibiotics warning signs discuss

## 2015-08-07 ENCOUNTER — Ambulatory Visit (INDEPENDENT_AMBULATORY_CARE_PROVIDER_SITE_OTHER): Payer: PPO | Admitting: Nurse Practitioner

## 2015-08-07 ENCOUNTER — Encounter: Payer: Self-pay | Admitting: Nurse Practitioner

## 2015-08-07 VITALS — BP 130/80 | Ht 60.0 in | Wt 267.4 lb

## 2015-08-07 DIAGNOSIS — K219 Gastro-esophageal reflux disease without esophagitis: Secondary | ICD-10-CM

## 2015-08-07 DIAGNOSIS — R062 Wheezing: Secondary | ICD-10-CM

## 2015-08-07 MED ORDER — PANTOPRAZOLE SODIUM 40 MG PO TBEC: DELAYED_RELEASE_TABLET | ORAL | Status: DC

## 2015-08-07 NOTE — Patient Instructions (Signed)
Increase flovent to 2 puff twice a day Increase lasix and potassium to 2 per day

## 2015-08-08 ENCOUNTER — Encounter: Payer: Self-pay | Admitting: Nurse Practitioner

## 2015-08-08 DIAGNOSIS — R062 Wheezing: Secondary | ICD-10-CM | POA: Insufficient documentation

## 2015-08-08 DIAGNOSIS — K219 Gastro-esophageal reflux disease without esophagitis: Secondary | ICD-10-CM | POA: Insufficient documentation

## 2015-08-08 NOTE — Progress Notes (Signed)
Subjective:  Presents for c/o continued wheezing following last visit on 2/27. No fever. Still has some cough and SOB, but improved from previous visit. Using albuterol BID. Struggling with weight, has had weight gain lately due to decreased activity. Eating healthy. GERD stable on Protonix.   Objective:   BP 130/80 mmHg  Ht 5' (1.524 m)  Wt 267 lb 6 oz (121.281 kg)  BMI 52.22 kg/m2 NAD. Alert, oriented. Lungs clear. No tachypnea. Normal color. Heart RRR. Abdomen soft, no epigastric area tenderness noted.   Assessment:  Problem List Items Addressed This Visit      Digestive   GERD (gastroesophageal reflux disease)   Relevant Medications   pantoprazole (PROTONIX) 40 MG tablet     Other   Wheezing - Primary     Plan:  Meds ordered this encounter  Medications  . pantoprazole (PROTONIX) 40 MG tablet    Sig: TAKE ONE TABLET BY MOUTH ONCE DAILY FOR  REFLUX    Dispense:  30 tablet    Refill:  5    Order Specific Question:  Supervising Provider    Answer:  [name] [[id number]]   Increase Flovent 110 to 2 puffs BID. Call back in 2 weeks if no improvement, sooner if worse. If stable at that time, plan to restart Phentermine. Encouraged patient to find activity that is not stressful to her joints. Return in June for physical and labs.

## 2015-08-10 ENCOUNTER — Telehealth: Payer: Self-pay

## 2015-08-10 NOTE — Telephone Encounter (Signed)
Pt's daughter called to set up TCS since her keeps her granddaughter. She would like to have the TCS done on 09/14/15 and the time is up to her mom. Please call patient and talk with her

## 2015-08-10 NOTE — Telephone Encounter (Signed)
LMOM for a return call to schedule.  

## 2015-08-11 NOTE — Telephone Encounter (Signed)
PT has been scheduled for 08/25/2015 an OV with Walden Field, NP due to med list. She had wanted to have her colonoscopy on 09/14/2015 because that is when she could have a ride. I am holding a slot on 09/14/2015 and will make Sharon [name] and Sharon [name] aware.

## 2015-08-25 ENCOUNTER — Ambulatory Visit (INDEPENDENT_AMBULATORY_CARE_PROVIDER_SITE_OTHER): Payer: PPO | Admitting: Nurse Practitioner

## 2015-08-25 ENCOUNTER — Encounter: Payer: Self-pay | Admitting: Nurse Practitioner

## 2015-08-25 ENCOUNTER — Other Ambulatory Visit: Payer: Self-pay

## 2015-08-25 VITALS — BP 176/86 | HR 77 | Temp 97.0°F | Ht 60.0 in | Wt 266.0 lb

## 2015-08-25 DIAGNOSIS — R062 Wheezing: Secondary | ICD-10-CM | POA: Diagnosis not present

## 2015-08-25 DIAGNOSIS — Z1211 Encounter for screening for malignant neoplasm of colon: Secondary | ICD-10-CM | POA: Diagnosis not present

## 2015-08-25 DIAGNOSIS — R1310 Dysphagia, unspecified: Secondary | ICD-10-CM

## 2015-08-25 DIAGNOSIS — K219 Gastro-esophageal reflux disease without esophagitis: Secondary | ICD-10-CM | POA: Diagnosis not present

## 2015-08-25 MED ORDER — PEG 3350-KCL-NA BICARB-NACL 420 G PO SOLR: 4000.0000 mL | ORAL | Status: DC

## 2015-08-25 MED ORDER — RANITIDINE HCL 150 MG PO TABS: 150.0000 mg | ORAL_TABLET | ORAL | Status: DC

## 2015-08-25 NOTE — Patient Instructions (Signed)
1. We will schedule your procedure for you. 2. Call and notify us if you develop an upper respiratory infection or have any respiratory symptoms leading up to your colonoscopy. 3. Add Zantac every other day in the evening, can increase to daily in the evening if needed. 4. Call us and notify if you have worsening or persistent GERD symptoms on Protonix and we will schedule you for a follow-up visit. 5. Further recommendations to be based on the results of your procedure.

## 2015-08-25 NOTE — Progress Notes (Signed)
Primary Care Physician:  Mickie Hillier, MD Primary Gastroenterologist:  Dr. Gala Romney  Chief Complaint  Patient presents with  . set up TCS    HPI:   Sharon [name] is a 60 y.o. female who presents to schedule repeat screening colonoscopy. She was brought in for office visit due to medications. Last colonoscopy 08/08/2005 on conscious sedation which found normal rectum, left-sided diverticula, remainder of colonic mucosa normal.  Today she denies abdominal pain, N/V, ematochezia, melena, unintentional weight loss, fever, chills, acute changes in bowel habits. GERD symptoms are generally well control, but is having some occasional nighttime reflux but thinks it is likely due to her weight. Drinks 2 cups of coffee a day, avoids spicy foods, carbonated beverages. Denies chest pain, dyspnea, dizziness, lightheadedness, syncope, near syncope. Denies any other upper or lower GI symptoms.  Hasn't had xanax in at least 6 months. Takes hydrocodone 1-2 times a week.  Past Medical History  Diagnosis Date  . GERD (gastroesophageal reflux disease)   . Migraine   . Melanoma ([place])   . Carpal tunnel syndrome     bilateral  . Arthritis   . Degenerative disk disease     mainly back  . Fibrocystic breast disease 1996  . Depression   . Anxiety   . Tinnitus     Past Surgical History  Procedure Laterality Date  . Appendectomy    . Tonsillectomy    . Vesicovaginal fistula closure w/ tah    . Total knee arthroplasty  2008    left   . Total knee revision  2.12.2012    left  . Menisus repair on rt knee      bilateral  . Abdominal hysterectomy    . Tubal ligation    . Breast biopsy      right  . Total knee arthroplasty  06/17/2011    Procedure: TOTAL KNEE ARTHROPLASTY;  Surgeon: Arther Abbott, MD;  Location: [facility];  Service: Orthopedics;  Laterality: Right;  De[place]uy  . Colonoscopy  07/2005    RMR: normal rectum, left-sided diverticular disease, otehrwise colon mucosa normal.    Current  Outpatient Prescriptions  Medication Sig Dispense Refill  . albuterol (PROVENTIL) (2.5 MG/3ML) 0.083% nebulizer solution Use one vial via neb q 4 hours prn wheezing 25 vial 2  . ALPRAZolam (XANAX) 0.5 MG tablet TAKE ONE TABLET BY MOUTH TWICE DAILY AS NEEDED --  **MAY  FILL  MONTHLY** 30 tablet 2  . Calcium Carbonate-Vitamin D (CALCIUM + D) 600-200 MG-UNIT TABS Take 1 tablet by mouth 2 (two) times daily.     . fish oil-omega-3 fatty acids 1000 MG capsule Take 2 g by mouth at bedtime.     . furosemide (LASIX) 20 MG tablet Take 1 tablet by mouth  daily 90 tablet 0  . HYDROcodone-acetaminophen (NORCO) 10-325 MG per tablet Take 1 tablet by mouth every 6 (six) hours as needed. 100 tablet 0  . methocarbamol (ROBAXIN) 500 MG tablet TAKE ONE TABLET BY MOUTH EVERY 6 HOURS AS NEEDED FOR  BACK  PAIN 60 tablet 2  . Multiple Vitamin (MULITIVITAMIN WITH MINERALS) TABS Take 1 tablet by mouth at bedtime.    . nabumetone (RELAFEN) 750 MG tablet TAKE ONE TABLET BY MOUTH TWICE DAILY AS NEEDED FOR MODERATE PAIN 180 tablet 1  . pantoprazole (PROTONIX) 40 MG tablet TAKE ONE TABLET BY MOUTH ONCE DAILY FOR  REFLUX 30 tablet 5  . cefPROZIL (CEFZIL) 500 MG tablet Take 1 tablet (500 mg total) by  mouth 2 (two) times daily. 20 tablet 0  . fluticasone (FLOVENT HFA) 110 MCG/ACT inhaler Inhale 2 puffs into the lungs 2 (two) times daily. 6 Inhaler 1  . polyethylene glycol-electrolytes (TRILYTE) 420 g solution Take 4,000 mLs by mouth as directed. 4000 mL 0  . potassium chloride (K-DUR,KLOR-CON) 10 MEQ tablet Take 1 tablet by mouth  twice a day 180 tablet 1  . predniSONE (DELTASONE) 20 MG tablet 2 po qd x 5 d 10 tablet 0  . ranitidine (ZANTAC) 150 MG tablet Take 1 tablet (150 mg total) by mouth every other day. In the evening 30 tablet 1   No current facility-administered medications for this visit.    Allergies as of 08/25/2015 - Review Complete 08/25/2015  Allergen Reaction Noted  . Bee venom Anaphylaxis 06/12/2011  .  Adhesive [tape] Hives and Dermatitis 12/30/2012  . Augmentin [amoxicillin-pot clavulanate] Diarrhea and Nausea Only 11/24/2012  . Latex Hives 05/14/2011  . Neomycin-bacitracin zn-polymyx Dermatitis     Family History  Problem Relation Age of Onset  . Cancer      family history   . Lung disease      family history   . Anesthesia problems Neg Hx   . Hypotension Neg Hx   . Malignant hyperthermia Neg Hx   . Pseudochol deficiency Neg Hx   . Hypertension Father   . Cancer Father 18    lung cancer  . Colon cancer Neg Hx     Social History   Social History  . Marital Status: Divorced    Spouse Name: N/A  . Number of Children: N/A  . Years of Education: N/A   Occupational History  . 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 Smoking status: Former Smoker -- 1.00 packs/day for 20 years    Types: Cigarettes    Start date: 08/02/1972    Quit date: 05/27/2006  . Smokeless tobacco: Never Used  . Alcohol Use: 0.0 oz/week    0 Standard drinks or equivalent per week     Comment: 1 glass occassionally on holidays (about once a month)  . Drug Use: No  . Sexual Activity: Yes    Birth Control/ Protection: Surgical   Other Topics Concern  . Not on file   Social History Narrative    Review of Systems: General: Negative for anorexia, weight loss, fever, chills, fatigue, weakness. ENT: Negative for hoarseness, difficulty swallowing. CV: Negative for chest pain, angina, palpitations, peripheral edema.  Respiratory: Negative for dyspnea at rest, cough, sputum, wheezing.  GI: See history of present illness. MS: Negative for joint pain, low back pain.  Derm: Negative for rash or itching.  Endo: Negative for unusual weight change.  Heme: Negative for bruising or bleeding. Allergy: Negative for rash or hives.    Physical Exam: BP 176/86 mmHg  Pulse 77  Temp(Src) 97 F (36.1 C)  Ht 5' (1.524 m)  Wt 266 lb (120.657 kg)  BMI 51.95 kg/m2 General:   Alert and oriented.  Pleasant and cooperative. Well-nourished and well-developed.  Head:  Normocephalic and atraumatic. Eyes:  Without icterus, sclera clear and conjunctiva pink.  Ears:  Normal auditory acuity. Cardiovascular:  S1, S2 present without murmurs appreciated. Extremities without clubbing or edema. Respiratory:  Clear to auscultation bilaterally. No wheezes, rales, or rhonchi. No distress.  Gastrointestinal:  +BS, soft, non-tender and non-distended. No guarding or rebound.  Rectal:  Deferred  Musculoskalatal:  Symmetrical without gross deformities. Skin:  Intact without significant lesions  or rashes. Neurologic:  Alert and oriented x4;  grossly normal neurologically. Psych:  Alert and cooperative. Normal mood and affect. Heme/Lymph/Immune: No excessive bruising noted.    08/30/2015 3:56 PM   Disclaimer: This note was dictated with voice recognition software. Similar sounding words can inadvertently be transcribed and may not be corrected upon review.

## 2015-08-29 ENCOUNTER — Encounter: Payer: Self-pay | Admitting: Orthopedic Surgery

## 2015-08-30 ENCOUNTER — Ambulatory Visit (INDEPENDENT_AMBULATORY_CARE_PROVIDER_SITE_OTHER): Payer: PPO | Admitting: Nurse Practitioner

## 2015-08-30 ENCOUNTER — Encounter: Payer: Self-pay | Admitting: Nurse Practitioner

## 2015-08-30 VITALS — BP 138/80 | Temp 99.0°F | Ht 60.0 in | Wt 265.0 lb

## 2015-08-30 DIAGNOSIS — J4521 Mild intermittent asthma with (acute) exacerbation: Secondary | ICD-10-CM | POA: Diagnosis not present

## 2015-08-30 DIAGNOSIS — J4520 Mild intermittent asthma, uncomplicated: Secondary | ICD-10-CM | POA: Insufficient documentation

## 2015-08-30 DIAGNOSIS — B9689 Other specified bacterial agents as the cause of diseases classified elsewhere: Secondary | ICD-10-CM

## 2015-08-30 DIAGNOSIS — H66001 Acute suppurative otitis media without spontaneous rupture of ear drum, right ear: Secondary | ICD-10-CM

## 2015-08-30 DIAGNOSIS — J069 Acute upper respiratory infection, unspecified: Secondary | ICD-10-CM

## 2015-08-30 MED ORDER — POTASSIUM CHLORIDE CRYS ER 10 MEQ PO TBCR: EXTENDED_RELEASE_TABLET | ORAL | Status: DC

## 2015-08-30 MED ORDER — FLUTICASONE PROPIONATE HFA 110 MCG/ACT IN AERO
2.0000 | INHALATION_SPRAY | Freq: Two times a day (BID) | RESPIRATORY_TRACT | Status: DC
Start: 2015-08-30 — End: 2015-11-23

## 2015-08-30 MED ORDER — PREDNISONE 20 MG PO TABS: ORAL_TABLET | ORAL | Status: DC

## 2015-08-30 MED ORDER — CEFPROZIL 500 MG PO TABS: 500.0000 mg | ORAL_TABLET | Freq: Two times a day (BID) | ORAL | Status: DC

## 2015-08-30 NOTE — Assessment & Plan Note (Signed)
Patient with no wheezing currently, typically only has a problem if she develops an upper respiratory infection or bronchitis. I've asked her to notify us if she develops any symptoms leading up to her procedure and she has agreed to do so. She is not oxygen dependent.

## 2015-08-30 NOTE — Progress Notes (Signed)
cc'ed to pcp °

## 2015-08-30 NOTE — Progress Notes (Signed)
Subjective:  Presents for c/o congestion and cough x 3 d. Slight fever. Sore throat. Frequent cough, worse at night producing yellow brown mucus. Wheezing at times; better after using albuterol nebs. No ear pain or headache. Currently on Flovent 110 2 puffs BID.   Objective:   BP 138/80 mmHg  Temp(Src) 99 F (37.2 C) (Oral)  Ht 5' (1.524 m)  Wt 265 lb (120.203 kg)  BMI 51.75 kg/m2 NAD. Alert, oriented. Lt TM clear effusion. Rt TM: dull with moderate erythema and slightly yellowish fluid. Pharynx injected. Neck supple with mild anterior adenopathy. Lungs: sonorous rhonchi noted along left side posterior; right side and anterior clear. Face flushed. Skin warm to the touch.  Assessment:  Problem List Items Addressed This Visit      Respiratory   Asthma, mild intermittent   Relevant Medications   fluticasone (FLOVENT HFA) 110 MCG/ACT inhaler   predniSONE (DELTASONE) 20 MG tablet    Other Visit Diagnoses    Bacterial upper respiratory infection    -  Primary    Relevant Medications    cefPROZIL (CEFZIL) 500 MG tablet    Acute suppurative otitis media of right ear without spontaneous rupture of tympanic membrane, recurrence not specified        Relevant Medications    cefPROZIL (CEFZIL) 500 MG tablet      Plan:  Meds ordered this encounter  Medications  . fluticasone (FLOVENT HFA) 110 MCG/ACT inhaler    Sig: Inhale 2 puffs into the lungs 2 (two) times daily.    Dispense:  6 Inhaler    Refill:  1    Order Specific Question:  Supervising Provider    Answer:  [name] [[id number]]  . potassium chloride (K-DUR,KLOR-CON) 10 MEQ tablet    Sig: Take 1 tablet by mouth  twice a day    Dispense:  180 tablet    Refill:  1    Order Specific Question:  Supervising Provider    Answer:  [name] [[id number]]  . cefPROZIL (CEFZIL) 500 MG tablet    Sig: Take 1 tablet (500 mg total) by mouth 2 (two) times daily.    Dispense:  20 tablet    Refill:  0    Order Specific Question:   Supervising Provider    Answer:  [name] [[id number]]  . predniSONE (DELTASONE) 20 MG tablet    Sig: 2 po qd x 5 d    Dispense:  10 tablet    Refill:  0    Order Specific Question:  Supervising Provider    Answer:  [name] [[id number]]   Continue Albuterol and Flovent as directed. Warning signs reviewed. Call back in 48 hours if no improvement, sooner if worse.

## 2015-08-30 NOTE — Assessment & Plan Note (Signed)
Patient due for repeat screening colonoscopy, last procedure in 2007 which found left-sided diverticula and remainder of mucosa normal. She is generally asymptomatic from a GI standpoint with the exception of occasional/rare GERD breakthrough symptoms as noted above. At this point we'll proceed with her colonoscopy for cancer screening purposes.  Proceed with TCS with 12.5 mg pre-procedure Phenergan with Dr. Gala Romney in near future: the risks, benefits, and alternatives have been discussed with the patient in detail. The patient states understanding and desires to proceed.  The patient is currently on Norco one to 2 times a week, Xanax. No other anticoagulants, anxiolytics, chronic pain medications, or antidepressants. We will add 12.5 mg preprocedure Phenergan to promote adequate sedation

## 2015-08-30 NOTE — Assessment & Plan Note (Signed)
Patient with occasional breakthrough symptoms which she states is likely due to her weight and coffee consumption. We'll add Zantac every other day in the evening as needed. Symptoms typically present at nighttime. Patient to call us of GERD symptoms do not resolve with Zantac and we can schedule follow-up at that point.

## 2015-09-06 ENCOUNTER — Ambulatory Visit (HOSPITAL_COMMUNITY): Admit: 2015-09-06 | Payer: Self-pay | Admitting: Internal Medicine

## 2015-09-06 ENCOUNTER — Encounter (HOSPITAL_COMMUNITY): Payer: Self-pay

## 2015-09-06 SURGERY — EGD (ESOPHAGOGASTRODUODENOSCOPY)
Anesthesia: Moderate Sedation

## 2015-09-14 ENCOUNTER — Encounter (HOSPITAL_COMMUNITY)
Admission: RE | Disposition: A | Discharge status: Home / Self Care | Payer: Self-pay | Source: Ambulatory Visit | Attending: Internal Medicine

## 2015-09-14 ENCOUNTER — Ambulatory Visit (HOSPITAL_COMMUNITY)
Admission: RE | Admit: 2015-09-14 | Discharge: 2015-09-14 | Disposition: A | Discharge status: Home / Self Care | Payer: PPO | Source: Ambulatory Visit | Attending: Internal Medicine | Admitting: Internal Medicine

## 2015-09-14 ENCOUNTER — Encounter (HOSPITAL_COMMUNITY): Payer: Self-pay | Admitting: *Deleted

## 2015-09-14 DIAGNOSIS — K219 Gastro-esophageal reflux disease without esophagitis: Secondary | ICD-10-CM | POA: Diagnosis not present

## 2015-09-14 DIAGNOSIS — D124 Benign neoplasm of descending colon: Secondary | ICD-10-CM | POA: Diagnosis not present

## 2015-09-14 DIAGNOSIS — Z87891 Personal history of nicotine dependence: Secondary | ICD-10-CM | POA: Diagnosis not present

## 2015-09-14 DIAGNOSIS — F329 Major depressive disorder, single episode, unspecified: Secondary | ICD-10-CM | POA: Diagnosis not present

## 2015-09-14 DIAGNOSIS — Z86010 Personal history of colonic polyps: Secondary | ICD-10-CM | POA: Insufficient documentation

## 2015-09-14 DIAGNOSIS — Z1211 Encounter for screening for malignant neoplasm of colon: Secondary | ICD-10-CM | POA: Diagnosis not present

## 2015-09-14 DIAGNOSIS — Z79899 Other long term (current) drug therapy: Secondary | ICD-10-CM | POA: Insufficient documentation

## 2015-09-14 DIAGNOSIS — K5730 Diverticulosis of large intestine without perforation or abscess without bleeding: Secondary | ICD-10-CM | POA: Diagnosis not present

## 2015-09-14 DIAGNOSIS — F419 Anxiety disorder, unspecified: Secondary | ICD-10-CM | POA: Diagnosis not present

## 2015-09-14 DIAGNOSIS — Z85820 Personal history of malignant melanoma of skin: Secondary | ICD-10-CM | POA: Insufficient documentation

## 2015-09-14 DIAGNOSIS — Z96651 Presence of right artificial knee joint: Secondary | ICD-10-CM | POA: Insufficient documentation

## 2015-09-14 DIAGNOSIS — K635 Polyp of colon: Secondary | ICD-10-CM | POA: Diagnosis not present

## 2015-09-14 HISTORY — PX: COLONOSCOPY: SHX5424

## 2015-09-14 SURGERY — COLONOSCOPY
Anesthesia: Moderate Sedation

## 2015-09-14 MED ORDER — MEPERIDINE HCL 100 MG/ML IJ SOLN
INTRAMUSCULAR | Status: DC | PRN
  Administered 2015-09-14: 50 mg via INTRAVENOUS
  Administered 2015-09-14: 25 mg via INTRAVENOUS

## 2015-09-14 MED ORDER — ONDANSETRON HCL 4 MG/2ML IJ SOLN
INTRAMUSCULAR | Status: DC | PRN
  Administered 2015-09-14: 4 mg via INTRAVENOUS

## 2015-09-14 MED ORDER — MIDAZOLAM HCL 5 MG/5ML IJ SOLN
INTRAMUSCULAR | Status: DC | PRN
  Administered 2015-09-14 (×2): 1 mg via INTRAVENOUS
  Administered 2015-09-14: 2 mg via INTRAVENOUS

## 2015-09-14 MED ORDER — MIDAZOLAM HCL 5 MG/5ML IJ SOLN
INTRAMUSCULAR | Status: AC
  Filled 2015-09-14: qty 10

## 2015-09-14 MED ORDER — PROMETHAZINE HCL 25 MG/ML IJ SOLN
INTRAMUSCULAR | Status: AC
  Administered 2015-09-14: 12.5 mg via INTRAVENOUS
  Filled 2015-09-14: qty 1

## 2015-09-14 MED ORDER — SIMETHICONE 40 MG/0.6ML PO SUSP
ORAL | Status: DC | PRN
  Administered 2015-09-14: 2.5 mL

## 2015-09-14 MED ORDER — MEPERIDINE HCL 100 MG/ML IJ SOLN
INTRAMUSCULAR | Status: AC
  Filled 2015-09-14: qty 2

## 2015-09-14 MED ORDER — SODIUM CHLORIDE 0.9% FLUSH
INTRAVENOUS | Status: AC
  Filled 2015-09-14: qty 10

## 2015-09-14 MED ORDER — SODIUM CHLORIDE 0.9 % IV SOLN
INTRAVENOUS | Status: DC
  Administered 2015-09-14: 09:00:00 via INTRAVENOUS

## 2015-09-14 MED ORDER — PROMETHAZINE HCL 25 MG/ML IJ SOLN
12.5000 mg | Freq: Once | INTRAMUSCULAR | Status: AC
  Administered 2015-09-14: 12.5 mg via INTRAVENOUS

## 2015-09-14 MED ORDER — ONDANSETRON HCL 4 MG/2ML IJ SOLN
INTRAMUSCULAR | Status: AC
  Filled 2015-09-14: qty 2

## 2015-09-14 NOTE — H&P (View-Only) (Signed)
Primary Care Physician:  Mickie Hillier, MD Primary Gastroenterologist:  Dr. Gala Romney  Chief Complaint  Patient presents with  . set up TCS    HPI:   Sharon [name] is a 60 y.o. female who presents to schedule repeat screening colonoscopy. She was brought in for office visit due to medications. Last colonoscopy 08/08/2005 on conscious sedation which found normal rectum, left-sided diverticula, remainder of colonic mucosa normal.  Today she denies abdominal pain, N/V, ematochezia, melena, unintentional weight loss, fever, chills, acute changes in bowel habits. GERD symptoms are generally well control, but is having some occasional nighttime reflux but thinks it is likely due to her weight. Drinks 2 cups of coffee a day, avoids spicy foods, carbonated beverages. Denies chest pain, dyspnea, dizziness, lightheadedness, syncope, near syncope. Denies any other upper or lower GI symptoms.  Hasn't had xanax in at least 6 months. Takes hydrocodone 1-2 times a week.  Past Medical History  Diagnosis Date  . GERD (gastroesophageal reflux disease)   . Migraine   . Melanoma ([place])   . Carpal tunnel syndrome     bilateral  . Arthritis   . Degenerative disk disease     mainly back  . Fibrocystic breast disease 1996  . Depression   . Anxiety   . Tinnitus     Past Surgical History  Procedure Laterality Date  . Appendectomy    . Tonsillectomy    . Vesicovaginal fistula closure w/ tah    . Total knee arthroplasty  2008    left   . Total knee revision  2.12.2012    left  . Menisus repair on rt knee      bilateral  . Abdominal hysterectomy    . Tubal ligation    . Breast biopsy      right  . Total knee arthroplasty  06/17/2011    Procedure: TOTAL KNEE ARTHROPLASTY;  Surgeon: Arther Abbott, MD;  Location: [facility];  Service: Orthopedics;  Laterality: Right;  De[place]uy  . Colonoscopy  07/2005    RMR: normal rectum, left-sided diverticular disease, otehrwise colon mucosa normal.    Current  Outpatient Prescriptions  Medication Sig Dispense Refill  . albuterol (PROVENTIL) (2.5 MG/3ML) 0.083% nebulizer solution Use one vial via neb q 4 hours prn wheezing 25 vial 2  . ALPRAZolam (XANAX) 0.5 MG tablet TAKE ONE TABLET BY MOUTH TWICE DAILY AS NEEDED --  **MAY  FILL  MONTHLY** 30 tablet 2  . Calcium Carbonate-Vitamin D (CALCIUM + D) 600-200 MG-UNIT TABS Take 1 tablet by mouth 2 (two) times daily.     . fish oil-omega-3 fatty acids 1000 MG capsule Take 2 g by mouth at bedtime.     . furosemide (LASIX) 20 MG tablet Take 1 tablet by mouth  daily 90 tablet 0  . HYDROcodone-acetaminophen (NORCO) 10-325 MG per tablet Take 1 tablet by mouth every 6 (six) hours as needed. 100 tablet 0  . methocarbamol (ROBAXIN) 500 MG tablet TAKE ONE TABLET BY MOUTH EVERY 6 HOURS AS NEEDED FOR  BACK  PAIN 60 tablet 2  . Multiple Vitamin (MULITIVITAMIN WITH MINERALS) TABS Take 1 tablet by mouth at bedtime.    . nabumetone (RELAFEN) 750 MG tablet TAKE ONE TABLET BY MOUTH TWICE DAILY AS NEEDED FOR MODERATE PAIN 180 tablet 1  . pantoprazole (PROTONIX) 40 MG tablet TAKE ONE TABLET BY MOUTH ONCE DAILY FOR  REFLUX 30 tablet 5  . cefPROZIL (CEFZIL) 500 MG tablet Take 1 tablet (500 mg total) by  mouth 2 (two) times daily. 20 tablet 0  . fluticasone (FLOVENT HFA) 110 MCG/ACT inhaler Inhale 2 puffs into the lungs 2 (two) times daily. 6 Inhaler 1  . polyethylene glycol-electrolytes (TRILYTE) 420 g solution Take 4,000 mLs by mouth as directed. 4000 mL 0  . potassium chloride (K-DUR,KLOR-CON) 10 MEQ tablet Take 1 tablet by mouth  twice a day 180 tablet 1  . predniSONE (DELTASONE) 20 MG tablet 2 po qd x 5 d 10 tablet 0  . ranitidine (ZANTAC) 150 MG tablet Take 1 tablet (150 mg total) by mouth every other day. In the evening 30 tablet 1   No current facility-administered medications for this visit.    Allergies as of 08/25/2015 - Review Complete 08/25/2015  Allergen Reaction Noted  . Bee venom Anaphylaxis 06/12/2011  .  Adhesive [tape] Hives and Dermatitis 12/30/2012  . Augmentin [amoxicillin-pot clavulanate] Diarrhea and Nausea Only 11/24/2012  . Latex Hives 05/14/2011  . Neomycin-bacitracin zn-polymyx Dermatitis     Family History  Problem Relation Age of Onset  . Cancer      family history   . Lung disease      family history   . Anesthesia problems Neg Hx   . Hypotension Neg Hx   . Malignant hyperthermia Neg Hx   . Pseudochol deficiency Neg Hx   . Hypertension Father   . Cancer Father 14    lung cancer  . Colon cancer Neg Hx     Social History   Social History  . Marital Status: Divorced    Spouse Name: N/A  . Number of Children: N/A  . Years of Education: N/A   Occupational History  . 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 Smoking status: Former Smoker -- 1.00 packs/day for 20 years    Types: Cigarettes    Start date: 08/02/1972    Quit date: 05/27/2006  . Smokeless tobacco: Never Used  . Alcohol Use: 0.0 oz/week    0 Standard drinks or equivalent per week     Comment: 1 glass occassionally on holidays (about once a month)  . Drug Use: No  . Sexual Activity: Yes    Birth Control/ Protection: Surgical   Other Topics Concern  . Not on file   Social History Narrative    Review of Systems: General: Negative for anorexia, weight loss, fever, chills, fatigue, weakness. ENT: Negative for hoarseness, difficulty swallowing. CV: Negative for chest pain, angina, palpitations, peripheral edema.  Respiratory: Negative for dyspnea at rest, cough, sputum, wheezing.  GI: See history of present illness. MS: Negative for joint pain, low back pain.  Derm: Negative for rash or itching.  Endo: Negative for unusual weight change.  Heme: Negative for bruising or bleeding. Allergy: Negative for rash or hives.    Physical Exam: BP 176/86 mmHg  Pulse 77  Temp(Src) 97 F (36.1 C)  Ht 5' (1.524 m)  Wt 266 lb (120.657 kg)  BMI 51.95 kg/m2 General:   Alert and oriented.  Pleasant and cooperative. Well-nourished and well-developed.  Head:  Normocephalic and atraumatic. Eyes:  Without icterus, sclera clear and conjunctiva pink.  Ears:  Normal auditory acuity. Cardiovascular:  S1, S2 present without murmurs appreciated. Extremities without clubbing or edema. Respiratory:  Clear to auscultation bilaterally. No wheezes, rales, or rhonchi. No distress.  Gastrointestinal:  +BS, soft, non-tender and non-distended. No guarding or rebound.  Rectal:  Deferred  Musculoskalatal:  Symmetrical without gross deformities. Skin:  Intact without significant lesions  or rashes. Neurologic:  Alert and oriented x4;  grossly normal neurologically. Psych:  Alert and cooperative. Normal mood and affect. Heme/Lymph/Immune: No excessive bruising noted.    08/30/2015 3:56 PM   Disclaimer: This note was dictated with voice recognition software. Similar sounding words can inadvertently be transcribed and may not be corrected upon review.

## 2015-09-14 NOTE — Discharge Instructions (Signed)
Colonoscopy Discharge Instructions  Read the instructions outlined below and refer to this sheet in the next few weeks. These discharge instructions provide you with general information on caring for yourself after you leave the hospital. Your doctor may also give you specific instructions. While your treatment has been planned according to the most current medical practices available, unavoidable complications occasionally occur. If you have any problems or questions after discharge, call Dr. Gala Romney at [phone]. ACTIVITY  You may resume your regular activity, but move at a slower pace for the next 24 hours.   Take frequent rest periods for the next 24 hours.   Walking will help get rid of the air and reduce the bloated feeling in your belly (abdomen).   No driving for 24 hours (because of the medicine (anesthesia) used during the test).    Do not sign any important legal documents or operate any machinery for 24 hours (because of the anesthesia used during the test).  NUTRITION  Drink plenty of fluids.   You may resume your normal diet as instructed by your doctor.   Begin with a light meal and progress to your normal diet. Heavy or fried foods are harder to digest and may make you feel sick to your stomach (nauseated).   Avoid alcoholic beverages for 24 hours or as instructed.  MEDICATIONS  You may resume your normal medications unless your doctor tells you otherwise.  WHAT YOU CAN EXPECT TODAY  Some feelings of bloating in the abdomen.   Passage of more gas than usual.   Spotting of blood in your stool or on the toilet paper.  IF YOU HAD POLYPS REMOVED DURING THE COLONOSCOPY:  No aspirin products for 7 days or as instructed.   No alcohol for 7 days or as instructed.   Eat a soft diet for the next 24 hours.  FINDING OUT THE RESULTS OF YOUR TEST Not all test results are available during your visit. If your test results are not back during the visit, make an appointment  with your caregiver to find out the results. Do not assume everything is normal if you have not heard from your caregiver or the medical facility. It is important for you to follow up on all of your test results.  SEEK IMMEDIATE MEDICAL ATTENTION IF:  You have more than a spotting of blood in your stool.   Your belly is swollen (abdominal distention).   You are nauseated or vomiting.   You have a temperature over 101.   You have abdominal pain or discomfort that is severe or gets worse throughout the day.     Diverticulosis and polyp information provided  Further recommendations to follow pending review of pathology report Colon Polyps Polyps are lumps of extra tissue growing inside the body. Polyps can grow in the large intestine (colon). Most colon polyps are noncancerous (benign). However, some colon polyps can become cancerous over time. Polyps that are larger than a pea may be harmful. To be safe, caregivers remove and test all polyps. CAUSES  Polyps form when mutations in the genes cause your cells to grow and divide even though no more tissue is needed. RISK FACTORS There are a number of risk factors that can increase your chances of getting colon polyps. They include:  Being older than 50 years.  Family history of colon polyps or colon cancer.  Long-term colon diseases, such as colitis or Crohn disease.  Being overweight.  Smoking.  Being inactive.  Drinking too much  body. Polyps can grow in the large intestine (colon). Most colon polyps are noncancerous (benign). However, some colon polyps can become cancerous over time. Polyps that are larger than a pea may be harmful. To be safe, caregivers remove and test all polyps. °CAUSES  °Polyps form when mutations in the genes cause your cells to grow and divide even though no more tissue is needed. °RISK FACTORS °There are a number of risk factors that can increase your chances of getting colon polyps. They include: °· Being older than 50 years. °· Family history of colon polyps or colon cancer. °· Long-term colon diseases, such as colitis or Crohn disease. °· Being overweight. °· Smoking. °· Being inactive. °· Drinking too much alcohol. °SYMPTOMS  °Most small polyps do not cause symptoms. If symptoms are present, they may include: °· Blood in the stool. The stool may look dark red or black. °· Constipation or diarrhea that lasts longer than 1 week. °DIAGNOSIS °People often do not know they have polyps until their caregiver finds them during a regular checkup. Your caregiver can use 4 tests to check for  polyps: °· Digital rectal exam. The caregiver wears gloves and feels inside the rectum. This test would find polyps only in the rectum. °· Barium enema. The caregiver puts a liquid called barium into your rectum before taking X-rays of your colon. Barium makes your colon look white. Polyps are dark, so they are easy to see in the X-ray pictures. °· Sigmoidoscopy. A thin, flexible tube (sigmoidoscope) is placed into your rectum. The sigmoidoscope has a light and tiny camera in it. The caregiver uses the sigmoidoscope to look at the last third of your colon. °· Colonoscopy. This test is like sigmoidoscopy, but the caregiver looks at the entire colon. This is the most common method for finding and removing polyps. °TREATMENT  °Any polyps will be removed during a sigmoidoscopy or colonoscopy. The polyps are then tested for cancer. °PREVENTION  °To help lower your risk of getting more colon polyps: °· Eat plenty of fruits and vegetables. Avoid eating fatty foods. °· Do not smoke. °· Avoid drinking alcohol. °· Exercise every day. °· Lose weight if recommended by your caregiver. °· Eat plenty of calcium and folate. Foods that are rich in calcium include milk, cheese, and broccoli. Foods that are rich in folate include chickpeas, kidney beans, and spinach. °HOME CARE INSTRUCTIONS °Keep all follow-up appointments as directed by your caregiver. You may need periodic exams to check for polyps. °SEEK MEDICAL CARE IF: °You notice bleeding during a bowel movement. °  °This information is not intended to replace advice given to you by your health care provider. Make sure you discuss any questions you have with your health care provider. °  °Document Released: 02/07/2004 Document Revised: 06/03/2014 Document Reviewed: 07/23/2011 °Elsevier Interactive Patient Education ©2016 [employer] ° ° ° ° ° ° ° °Diverticulosis °Diverticulosis is the condition that develops when small pouches (diverticula) form in the wall of your colon. Your  colon, or large intestine, is where water is absorbed and stool is formed. The pouches form when the inside layer of your colon pushes through weak spots in the outer layers of your colon. °CAUSES  °No one knows exactly what causes diverticulosis. °RISK FACTORS °· Being older than 50. Your risk for this condition increases with age. Diverticulosis is rare in people younger than 40 years. By age 80, almost everyone has it. °· Eating a low-fiber diet. °· Being frequently constipated. °· Being overweight. °·   Not getting enough exercise. °· Smoking. °· Taking over-the-counter pain medicines, like aspirin and ibuprofen. °SYMPTOMS  °Most people with diverticulosis do not have symptoms. °DIAGNOSIS  °Because diverticulosis often has no symptoms, health care providers often discover the condition during an exam for other colon problems. In many cases, a health care provider will diagnose diverticulosis while using a flexible scope to examine the colon (colonoscopy). °TREATMENT  °If you have never developed an infection related to diverticulosis, you may not need treatment. If you have had an infection before, treatment may include: °· Eating more fruits, vegetables, and grains. °· Taking a fiber supplement. °· Taking a live bacteria supplement (probiotic). °· Taking medicine to relax your colon. °HOME CARE INSTRUCTIONS  °· Drink at least 6-8 glasses of water each day to prevent constipation. °· Try not to strain when you have a bowel movement. °· Keep all follow-up appointments. °If you have had an infection before:  °· Increase the fiber in your diet as directed by your health care provider or dietitian. °· Take a dietary fiber supplement if your health care provider approves. °· Only take medicines as directed by your health care provider. °SEEK MEDICAL CARE IF:  °· You have abdominal pain. °· You have bloating. °· You have cramps. °· You have not gone to the bathroom in 3 days. °SEEK IMMEDIATE MEDICAL CARE IF:  °· Your  pain gets worse. °· Your bloating becomes very bad. °· You have a fever or chills, and your symptoms suddenly get worse. °· You begin vomiting. °· You have bowel movements that are bloody or black. °MAKE SURE YOU: °· Understand these instructions. °· Will watch your condition. °· Will get help right away if you are not doing well or get worse. °  °This information is not intended to replace advice given to you by your health care provider. Make sure you discuss any questions you have with your health care provider. °  °Document Released: 02/08/2004 Document Revised: 05/18/2013 Document Reviewed: 04/07/2013 °Elsevier Interactive Patient Education ©2016 [employer] ° ° °

## 2015-09-14 NOTE — Interval H&P Note (Signed)
History and Physical Interval Note:  09/14/2015 9:04 AM  Sharon [name]  has presented today for surgery, with the diagnosis of SCREENING  The various methods of treatment have been discussed with the patient and family. After consideration of risks, benefits and other options for treatment, the patient has consented to  Procedure(s) with comments: COLONOSCOPY (N/A) - 930 - moved to 9:15 - office to notify as a surgical intervention .  The patient's history has been reviewed, patient examined, no change in status, stable for surgery.  I have reviewed the patient's chart and labs.  Questions were answered to the patient's satisfaction.     Sharon [name]  No change. Screening colonoscopy per plan.  The risks, benefits, limitations, alternatives and imponderables have been reviewed with the patient. Questions have been answered. All parties are agreeable.

## 2015-09-14 NOTE — Op Note (Signed)
[facility] Patient Name: Sharon [name] Procedure Date: 09/14/2015 9:07 AM MRN: [id number] Date of Birth: [birth year] Attending MD: Norvel Richards , MD CSN: [id number] Age: 60 Admit Type: Outpatient Procedure:                Colonoscopy with snare polypectomy Indications:              Screening for colorectal malignant neoplasm -                            Average risk Providers:                Norvel Richards, MD, Lurline Del, RN, Isabella Stalling, Technician Referring MD:              Medicines:                Midazolam 4 mg IV, Meperidine 75 mg IV, Ondansetron                            4 mg IV Complications:            No immediate complications. Estimated Blood Loss:     Estimated blood loss was minimal. Procedure:                Pre-Anesthesia Assessment:                           - Prior to the procedure, a History and Physical                            was performed, and patient medications and                            allergies were reviewed. The patient's tolerance of                            previous anesthesia was also reviewed. The risks                            and benefits of the procedure and the sedation                            options and risks were discussed with the patient.                            All questions were answered, and informed consent                            was obtained. Prior Anticoagulants: The patient has                            taken no previous anticoagulant or antiplatelet  agents. ASA Grade Assessment: II - A patient with                            mild systemic disease. After reviewing the risks                            and benefits, the patient was deemed in                            satisfactory condition to undergo the procedure.                           After obtaining informed consent, the colonoscope                            was passed under  direct vision. Throughout the                            procedure, the patient's blood pressure, pulse, and                            oxygen saturations were monitored continuously. The                            [id number]) scope was introduced through                            the anus and advanced to the the cecum, identified                            by appendiceal orifice and ileocecal valve. The                            ileocecal valve, appendiceal orifice, and rectum                            were photographed. The colonoscopy was performed                            without difficulty. The patient tolerated the                            procedure well. The quality of the bowel                            preparation was adequate. Scope In: 9:14:42 AM Scope Out: 9:28:03 AM Scope Withdrawal Time: 0 hours 10 minutes 50 seconds  Total Procedure Duration: 0 hours 13 minutes 21 seconds  Findings:      The perianal and digital rectal examinations were normal.      Multiple medium-mouthed diverticula were found in the entire colon.      (1) 5 mm polyp was found in the descending colon. The polyp was       semi-pedunculated. The polyp was removed with a cold snare. Resection  and retrieval were complete. Estimated blood loss was minimal. Impression:               - Moderate diverticulosis in the entire examined                            colon. Purulent discharge was seen in association                            with the diverticular opening, suspicious of                            diverticulitis.                           - One 5 mm polyp in the descending colon, removed                            with a cold snare. Resected and retrieved. Moderate Sedation:      Moderate (conscious) sedation was administered by the endoscopy nurse       and supervised by the endoscopist. The following parameters were       monitored: oxygen saturation, heart rate, blood pressure,  respiratory       rate, EKG, adequacy of pulmonary ventilation, and response to care.       Total physician intraservice time was 15 minutes. Recommendation:           - Repeat colonoscopy date to be determined after                            pending pathology results are reviewed for                            surveillance.                           - Return to GI office after studies are complete.                           - Advance diet as tolerated.                           - Continue present medications.                           - Await pathology results.                           - Advance diet as tolerated.                           - Continue present medications. Procedure Code(s):        --- Professional ---                           [phone], Colonoscopy, flexible; with removal of  tumor(s), polyp(s), or other lesion(s) by snare                            technique                           99152, Moderate sedation services provided by the                            same physician or other qualified health care                            professional performing the diagnostic or                            therapeutic service that the sedation supports,                            requiring the presence of an independent trained                            observer to assist in the monitoring of the                            patient's level of consciousness and physiological                            status; initial 15 minutes of intraservice time,                            patient age 46 years or older Diagnosis Code(s):        --- Professional ---                           Z12.11, Encounter for screening for malignant                            neoplasm of colon                           D12.4, Benign neoplasm of descending colon                           K57.30, Diverticulosis of large intestine without                            perforation or  abscess without bleeding CPT copyright 2016 [employer]. All rights reserved. The codes documented in this report are preliminary and upon coder review may  be revised to meet current compliance requirements. Cristopher Estimable. Demeisha Geraghty, MD Norvel Richards, MD 09/14/2015 9:36:27 AM This report has been signed electronically. Number of Addenda: 0

## 2015-09-18 ENCOUNTER — Encounter: Payer: Self-pay | Admitting: Internal Medicine

## 2015-09-19 ENCOUNTER — Encounter (HOSPITAL_COMMUNITY): Payer: Self-pay | Admitting: Internal Medicine

## 2015-09-20 ENCOUNTER — Other Ambulatory Visit: Payer: Self-pay | Admitting: Nurse Practitioner

## 2015-09-24 DIAGNOSIS — R202 Paresthesia of skin: Secondary | ICD-10-CM | POA: Diagnosis not present

## 2015-09-24 DIAGNOSIS — X58XXXA Exposure to other specified factors, initial encounter: Secondary | ICD-10-CM | POA: Diagnosis not present

## 2015-09-24 DIAGNOSIS — L500 Allergic urticaria: Secondary | ICD-10-CM | POA: Diagnosis not present

## 2015-09-25 ENCOUNTER — Encounter: Payer: Self-pay | Admitting: Family Medicine

## 2015-09-25 ENCOUNTER — Ambulatory Visit (INDEPENDENT_AMBULATORY_CARE_PROVIDER_SITE_OTHER): Payer: PPO | Admitting: Family Medicine

## 2015-09-25 VITALS — BP 132/82 | Ht 60.0 in | Wt 265.0 lb

## 2015-09-25 DIAGNOSIS — T783XXA Angioneurotic edema, initial encounter: Secondary | ICD-10-CM | POA: Diagnosis not present

## 2015-09-25 DIAGNOSIS — L500 Allergic urticaria: Secondary | ICD-10-CM | POA: Diagnosis not present

## 2015-09-25 MED ORDER — FUROSEMIDE 40 MG PO TABS: 40.0000 mg | ORAL_TABLET | Freq: Every day | ORAL | Status: DC

## 2015-09-25 NOTE — Progress Notes (Signed)
   Subjective:    Patient ID: Sharon [name], female    DOB: 01-08-1956, 60 y.o.   MRN: [id number]  patient arrives for office for protracted discussion regarding her trip to emergency room HPI Patient arrives with follow up from ER for allergic reaction. Patient stated it started with hives and seemed to get worse- ER gave her prednisone in. Patient has not filled the follow up prednisone from ER.  pt took bendryl and pepcid, took sixty of pred   patient notes his eruption of hives occurred after eating out.  Patient states has never had hives like this before.  Did also notes some swelling around the lips. And also had a since that she was developing hoarseness. Today hoarseness is more an issue according to the patient. Next   no shortness of breath.  Patient has been experiencing some wheezing separate from this  Review of Systems No headache, no major weight loss or weight gain, no chest pain no back pain abdominal pain no change in bowel habits complete ROS otherwise negative     Objective:   Physical Exam  alert no acute distress. Vitals stable oriented 3 HEENT no angioedema currently pharynx normal neck supple. Voice not noticeably hoarse at the moment lungs clear heart rare rhythm skin no significant rash   materials from the emergency room revealed review       Assessment & Plan:   impression acute urticaria with elements of angioedema her history discussed at great length plan maintain Pepcid ahead and fill out medicine. Go ahead and continue EpiPen rationale discussed allergy referral rationale discussed [facility]

## 2015-09-26 ENCOUNTER — Encounter: Payer: Self-pay | Admitting: Family Medicine

## 2015-10-20 ENCOUNTER — Encounter: Payer: Self-pay | Admitting: Allergy and Immunology

## 2015-10-20 ENCOUNTER — Ambulatory Visit (INDEPENDENT_AMBULATORY_CARE_PROVIDER_SITE_OTHER): Payer: PPO | Admitting: Allergy and Immunology

## 2015-10-20 ENCOUNTER — Other Ambulatory Visit: Payer: Self-pay | Admitting: Allergy and Immunology

## 2015-10-20 VITALS — BP 162/92 | HR 76 | Temp 98.3°F | Resp 16 | Ht 61.02 in | Wt 272.0 lb

## 2015-10-20 DIAGNOSIS — L509 Urticaria, unspecified: Secondary | ICD-10-CM

## 2015-10-20 DIAGNOSIS — J309 Allergic rhinitis, unspecified: Secondary | ICD-10-CM | POA: Diagnosis not present

## 2015-10-20 DIAGNOSIS — T7800XA Anaphylactic reaction due to unspecified food, initial encounter: Secondary | ICD-10-CM | POA: Diagnosis not present

## 2015-10-20 DIAGNOSIS — H1010 Acute atopic conjunctivitis, unspecified eye: Secondary | ICD-10-CM

## 2015-10-20 DIAGNOSIS — R062 Wheezing: Secondary | ICD-10-CM

## 2015-10-20 DIAGNOSIS — R05 Cough: Secondary | ICD-10-CM | POA: Diagnosis not present

## 2015-10-20 DIAGNOSIS — R059 Cough, unspecified: Secondary | ICD-10-CM

## 2015-10-20 NOTE — Progress Notes (Signed)
NEW PATIENT NOTE  RE: Sharon [name] MRN: [id number] DOB: 08-28-1955 [facility]  [address] [place] [address] Date of Office Visit: 10/20/2015  Dear Sharon Kirschner, MD:  I had the pleasure of seeing [name] today in initial evaluation as you recall-- Subjective:  Sharon [name] is a 60 y.o. female who presents today for Urticaria  Assessment:   1. Episodes of hives, suspected allergic reaction, unclear etiology.   2. Allergic rhinoconjunctivitis.   3. Recent diagnosis of adult onset persistent asthma, with noted exercise-induced bronchospasm   4.   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    5.      Previous concern for latex hypersensitivity, and is retired Therapist, sports. 6.      Complex medical history. Plan:  1. Avoidance: Mite, Mold and Pollen, Nuts and lamb, for now 2. Antihistamine:  Xyzal 5mg  by mouth once daily for runny nose or itching. 3. Nasal Spray: Rhinocort AQ 1-2 spray(s) each nostril once daily for stuffy nose or drainage.  4. Inhalers:  Rescue: ProAir 2 puffs every 4 hours as needed for cough or wheeze.       -May use 2 puffs 10-20 minutes prior to exercise.  Preventative: Dulera 200 2 puffs twice daily (Rinse, gargle, and spit out after use). ---Stop Flovent. 5. Eye Drops: Zaditor one drop(s) each eye twice daily for itchy eyes as needed. 6. Other: Obtain selected labs at [facility]. 7. Nasal Saline wash each evening at shower time. 8. Follow up Visit: 4-6 weeks or sooner if needed.  HPI: [name] presents to the office in initial evaluation accompanied by her daughter with concern for allergy.  She describes less than a 5 years history of recurring rhinorrhea, congestion, sneezing, itchy watery eyes, postnasal drip, and occasional cough.  Symptoms are mostly prominent in the spring, very mild each autumn season.  However, November/December 2016, she describes  increasing lower respiratory symptoms including wheeze, chest congestion, chest tightness, throat clearing and courses of prednisone.  She was diagnosed with asthma noting upper respiratory infections as typical trigger which she thought occurred most significantly after pneumonia episode in 2015.  She uses albuterol about 3 times a week over greatest concern for exercise induced symptoms and often daily difficulty with pollen, outdoor fluctuant weather pattern exposures.    In addition, in the last month she has had at least 2 episodes of itching, significant body redness/hives and loose stools.  The first occurred when she was out of town on a Saturday night.  She recalls dinner 7:30pm would have included stew beef pot, baked sweet potato, onion with greens salad with raspberry vinaigrette and red velvet cake.  About 1:00am, she remembers itching which seemed to worsen as the morning progressed to include hives  across her abdomen, legs and hands. Once progressed to her arms, she took Benadryl and Pepcid.  With the significant GI upset, she went to the emergency department.  She received prednisone and additional Pepcid with improvement.  She was sent home on additional Pepcid, prednisone with prescription for EpiPen.  Approximately a week later while still on prednisone, she had similar itching and hives, which this time included her neck and face and a sense of shortness of breath without dysphagia or lips, tongue or throat swelling.  There was loose stools and she correlated eating pecan cinnamon [place] 20 minutes before difficulty began.  She did have multiple stressors and activities occurring at  the time of her episodes, but is unclear on any specific trigger.  She remembers the first episode having hoarseness and almost voice loss, continuing into more than 24 hours of the episode during her primary MD follow-up.  She has maintained on several maintenance medications, including inhaled corticosteroid and  oral antihistamines.  There were no further hives or reaction episodes in the recent weeks.  She does describe large hives, swelling episode at her right leg 30 years ago from bee sting and itching and hives with latex, after her left knee operation.  She may recall tick bites years ago.  She had eaten beef or pork since April without difficulty.  Medical History: Past Medical History  Diagnosis Date  . GERD (gastroesophageal reflux disease)   . Migraine   . Melanoma ([place])   . Carpal tunnel syndrome     bilateral  . Arthritis   . Degenerative disk disease     mainly back  . Fibrocystic breast disease 1996  . Depression   . Anxiety   . Tinnitus    Surgical History: Past Surgical History  Procedure Laterality Date  . Appendectomy    . Tonsillectomy    . Vesicovaginal fistula closure w/ tah    . Total knee arthroplasty  2008    left   . Total knee revision  2.12.2012    left  . Menisus repair on rt knee      bilateral  . Abdominal hysterectomy    . Tubal ligation    . Breast biopsy      right  . Total knee arthroplasty  06/17/2011    Procedure: TOTAL KNEE ARTHROPLASTY;  Surgeon: Arther Abbott, MD;  Location: [facility];  Service: Orthopedics;  Laterality: Right;  [place]  . Colonoscopy  07/2005    RMR: normal rectum, left-sided diverticular disease, otehrwise colon mucosa normal.  . Colonoscopy N/A 09/14/2015    Procedure: COLONOSCOPY;  Surgeon: Daneil Dolin, MD;  Location: [facility]ITE;  Service: Endoscopy;  Laterality: N/A;  930 - moved to 9:15 - office to notify   Family History: Family History  Problem Relation Age of Onset  . Cancer      family history   . Lung disease      family history   . Anesthesia problems Neg Hx   . Hypotension Neg Hx   . Malignant hyperthermia Neg Hx   . Pseudochol deficiency Neg Hx   . Colon cancer Neg Hx   . Angioedema Neg Hx   . Asthma Neg Hx   . Eczema Neg Hx   . Immunodeficiency Neg Hx   . Urticaria Neg Hx   . Hypertension Father    . Cancer Father 2    lung cancer  . Allergic rhinitis Daughter   . Food Allergy Grandchild    Social History: Social History   Social History  . Marital Status: Single    Spouse Name: N/A  . Number of Children: N/A  . Years of Education: N/A   Occupational History  . RN at [employer]  . Smoking status: Former Smoker -- 1.00 packs/day for 20 years    Types: Cigarettes    Start date: 08/02/1972    Quit date: 05/27/2006  . Smokeless tobacco: Never Used  . Alcohol Use: 0.0 oz/week    0 Standard drinks or equivalent per week     Comment: 1 glass occassionally on holidays (about once a month)  .  Drug Use: No  . Sexual Activity: Yes    Birth Control/ Protection: Surgical   Social History Narrative  Nattaly is at home with her indoor cat and is retired.  Nou has a current medication list which includes the following prescription(s): albuterol, alprazolam, calcium carbonate-vitamin d, epinephrine, fish oil-omega-3 fatty acids, fluticasone, furosemide, methocarbamol, multivitamin with minerals, nabumetone, pantoprazole, potassium chloride, ranitidine.  Drug Allergies: Allergies  Allergen Reactions  . Adhesive [Tape] Hives and Dermatitis    Can tolerate tegaderm  . Augmentin [Amoxicillin-Pot Clavulanate] Diarrhea and Nausea Only  . Latex Hives    Whelps  . Neomycin-Bacitracin Zn-Polymyx Dermatitis   Environmental History: Ricky lives in a 61 year old house for 20 years with wood floors, with central heat and air; stuffed mattress, non-feather pillow/comforter with bedroom humidifier and cat without smokers.   Review of Systems  Constitutional: Negative for fever, weight loss and malaise/fatigue.  HENT: Positive for congestion. Negative for ear pain, hearing loss, nosebleeds and sore throat.   Eyes: Negative for discharge and redness.       Corrective eyeglasses lenses for 40 years.  Respiratory: Positive for cough. Negative for shortness of breath.         See history of present illness.  Episode of pneumonia = 2015.  Denies history of bronchitis.  Gastrointestinal: Positive for heartburn. Negative for nausea, vomiting, abdominal pain, diarrhea and constipation.  Genitourinary: Negative.   Musculoskeletal: Negative for myalgias.       History of arthritis.  Skin: Negative for itching and rash.       Hive episodes. See HPI.  Neurological: Negative.  Negative for dizziness, seizures, weakness and headaches.  Endo/Heme/Allergies: Positive for environmental allergies.       Denies sensitivity to aspirin, NSAIDs, foods, jewelry and cosmetics.  Immunological: No chronic or recurring infections. Objective:   Filed Vitals:   10/20/15 0904  BP: 162/92  Pulse: 76  Temp: 98.3 F (36.8 C)  Resp: 16   SpO2 Readings from Last 1 Encounters:  10/20/15 97%   Physical Exam  Constitutional: She is well-developed, well-nourished, and in no distress.  HENT:  Head: Atraumatic.  Right Ear: Tympanic membrane and ear canal normal.  Left Ear: Tympanic membrane and ear canal normal.  Nose: Mucosal edema present. No rhinorrhea. No epistaxis.  Mouth/Throat: Oropharynx is clear and moist and mucous membranes are normal. No oropharyngeal exudate, posterior oropharyngeal edema or posterior oropharyngeal erythema.  Eyes: Conjunctivae are normal.  Neck: Neck supple.  Cardiovascular: Normal rate, S1 normal and S2 normal.   No murmur heard. Pulmonary/Chest: Effort normal. She has no wheezes. She has no rhonchi. She has no rales.  Abdominal: Soft. Normal appearance and bowel sounds are normal.  Musculoskeletal: She exhibits no edema.  Lymphadenopathy:    She has no cervical adenopathy.  Neurological: She is alert.  Skin: Skin is warm and intact. No rash noted. No cyanosis. Nails show no clubbing.   Diagnostics: Spirometry:  FVC  1.97--67%, FEV1  1.56 --69%, (FEV1% 101%--see scanned image-- noted restriction) post bronchodilator essentially no change.    Skin testing:  Strong reactivity to cat hair and dog epithelial; mild reactivity to oak, pecan and pine tree pollens, marshelder weed pollen, penicillium mold and grass mix pollen and mild reactivity to dust mite mix.     Sibbie Flammia M. Ishmael Holter, MD   cc: Mickie Hillier, MD

## 2015-10-20 NOTE — Patient Instructions (Signed)
Take Home Sheet  1. Avoidance: Mite, Mold and Pollen   2. Antihistamine:  Xyzal 5mg  by mouth once daily for runny nose or itching.   3. Nasal Spray: Rhinocort AQ 1-2 spray(s) each nostril once daily for stuffy nose or drainage.    4. Inhalers:  Rescue: ProAir 2 puffs every 4 hours as needed for cough or wheeze.       -May use 2 puffs 10-20 minutes prior to exercise.   Preventative: Dulera 200 2 puffs twice daily (Rinse, gargle, and spit out after use). ---Stop Flovent.   5. Eye Drops: Zaditor one drop(s) each eye twice daily for itchy eyes as needed.   6. Other: Obtain selected labs at [facility].   7. Nasal Saline wash each evening at shower time.  8. Follow up Visit: 4-6 weeks or sooner if needed.   Websites that have reliable Patient information: 1. American Academy of Asthma, Allergy, & Immunology: www.aaaai.org 2. Food Allergy Network: www.foodallergy.org 3. Mothers of Asthmatics: www.aanma.org 4. [place]: DiningCalendar.de 5. American College of Allergy, Asthma, & Immunology: https://robertson.info/ or www.acaai.org  Control of House Dust Mite Allergen  House dust mites play a major role in allergic asthma and rhinitis.  They occur in environments with high humidity wherever human skin, the food for dust mites is found. High levels have been detected in dust obtained from mattresses, pillows, carpets, upholstered furniture, bed covers, clothes and soft toys.  The principal allergen of the house dust mite is found in its feces.  A gram of dust may contain 1,000 mites and 250,000 fecal particles.  Mite antigen is easily measured in the air during house cleaning activities.  1. Encase mattresses, including the box spring, and pillow, in an air tight cover.  Seal the zipper end of the encased mattresses with wide adhesive tape. 2. Wash the bedding in water of 130 degrees Farenheit weekly.  Avoid cotton comforters/quilts and flannel bedding: the  most ideal bed covering is the dacron comforter. 3. Remove all upholstered furniture from the bedroom. 4. Remove carpets, carpet padding, rugs, and non-washable window drapes from the bedroom.  Wash drapes weekly or use plastic window coverings. 5. Remove all non-washable stuffed toys from the bedroom.  Wash stuffed toys weekly. 6. Have the room cleaned frequently with a vacuum cleaner and a damp dust-mop.  The patient should not be in a room which is being cleaned and should wait 1 hour after cleaning before going into the room. 7. Close and seal all heating outlets in the bedroom.  Otherwise, the room will become filled with dust-laden air.  An electric heater can be used to heat the room. 8. Reduce indoor humidity to less than 50%.  Do not use a humidifier.  Reducing Pollen Exposure  The American Academy of Allergy, Asthma and Immunology suggests the following steps to reduce your exposure to pollen during allergy seasons.  9. Do not hang sheets or clothing out to dry; pollen may collect on these items. 10. Do not mow lawns or spend time around freshly cut grass; mowing stirs up pollen. 11. Keep windows closed at night.  Keep car windows closed while driving. 12. Minimize morning activities outdoors, a time when pollen counts are usually at their highest. 13. Stay indoors as much as possible when pollen counts or humidity is high and on windy days when pollen tends to remain in the air longer. 14. Use air conditioning when possible.  Many air conditioners have filters that trap  the pollen spores. 15. Use a HEPA room air filter to remove pollen form the indoor air you breathe.  Control of Mold Allergen  Mold and fungi can grow on a variety of surfaces provided certain temperature and moisture conditions exist.  Outdoor molds grow on plants, decaying vegetation and soil.  The major outdoor mold, Alternaria dn Cladosporium, are found in very high numbers during hot and dry conditions.   Generally, a late Summer - Fall peak is seen for common outdoor fungal spores.  Rain will temporarily lower outdoor mold spore count, but counts rise rapidly when the rainy period ends.  The most important indoor molds are Aspergillus and Penicillium.  Dark, humid and poorly ventilated basements are ideal sites for mold growth.  The next most common sites of mold growth are the bathroom and the kitchen.  Outdoor [employer] 1. Use air conditioning and keep windows closed 2. Avoid exposure to decaying vegetation. 3. Avoid leaf raking. 4. Avoid grain handling. 5. Consider wearing a face mask if working in moldy areas.  Indoor Mold Control 1. Maintain humidity below 50%. 2. Clean washable surfaces with 5% bleach solution. 3. Remove sources e.g. Contaminated carpets.  Control of Cockroach Allergen  Cockroach allergen has been identified as an important cause of acute attacks of asthma, especially in urban settings.  There are fifty-five species of cockroach that exist in the [place], however only three, the [place], [place] species produce allergen that can affect patients with Asthma.  Allergens can be obtained from fecal particles, egg casings and secretions from cockroaches.  1. Remove food sources. 2. Reduce access to water. 3. Seal access and entry points. 4. Spray runways with 0.5-1% Diazinon or Chlorpyrifos 5. Blow boric acid power under stoves and refrigerator. 6. Place bait stations (hydramethylnon) at feeding sites.

## 2015-10-21 ENCOUNTER — Encounter: Payer: Self-pay | Admitting: Allergy and Immunology

## 2015-10-24 LAB — ALLERGY PANEL 18, NUT MIX GROUP
Almonds: <0.1 kU/L
Cashew IgE: <0.1 kU/L
Coconut: <0.1 kU/L
Hazelnut: <0.1 kU/L
Sesame Seed f10: <0.1 kU/L

## 2015-10-24 LAB — ALLERGEN, CINNAMON, RF220

## 2015-10-24 LAB — IGE: IGE (IMMUNOGLOBULIN E), SERUM: 75 kU/L (ref <115)

## 2015-10-24 LAB — ALLERGEN, RASPBERRY, RF343

## 2015-10-24 LAB — ALLERGEN, CUCUMBER, F244

## 2015-10-27 LAB — ALPHA-GAL PANEL
ALLERGEN, MUTTON, F88: 0.13 kU/L — AB
ALLERGEN, PORK, F26: 0.11 kU/L — AB

## 2015-10-30 ENCOUNTER — Encounter: Payer: PPO | Admitting: Nurse Practitioner

## 2015-10-30 ENCOUNTER — Other Ambulatory Visit: Payer: Self-pay | Admitting: Family Medicine

## 2015-11-02 ENCOUNTER — Encounter: Payer: Self-pay | Admitting: Nurse Practitioner

## 2015-11-02 ENCOUNTER — Ambulatory Visit (INDEPENDENT_AMBULATORY_CARE_PROVIDER_SITE_OTHER): Payer: PPO | Admitting: Nurse Practitioner

## 2015-11-02 VITALS — BP 146/80 | Ht 61.0 in | Wt 268.0 lb

## 2015-11-02 DIAGNOSIS — R5383 Other fatigue: Secondary | ICD-10-CM | POA: Diagnosis not present

## 2015-11-02 DIAGNOSIS — Z0000 Encounter for general adult medical examination without abnormal findings: Secondary | ICD-10-CM

## 2015-11-02 DIAGNOSIS — Z79899 Other long term (current) drug therapy: Secondary | ICD-10-CM | POA: Diagnosis not present

## 2015-11-02 MED ORDER — ALPRAZOLAM 0.5 MG PO TABS: ORAL_TABLET | ORAL | Status: DC

## 2015-11-04 ENCOUNTER — Encounter: Payer: Self-pay | Admitting: Nurse Practitioner

## 2015-11-04 NOTE — Progress Notes (Signed)
   Subjective:    Patient ID: Sharon [name], female    DOB: [birth year], 60 y.o.   MRN: [id number]  HPI presents for her wellness exam. No new sexual partners. Regular vision and dental exams. Recently had hives due to allergic reaction. Being followed by Dr. Ishmael Holter; has a follow up appointment. Still on Prednisone. Gets regular skin cancer screenings. Trying to stay as active as she can; has significant ortho issues. Mild fatigue.     Review of Systems  Constitutional: Positive for fatigue. Negative for activity change and appetite change.  HENT: Negative for dental problem, ear pain, sinus pressure and sore throat.   Respiratory: Negative for cough, chest tightness, shortness of breath and wheezing.   Cardiovascular: Negative for chest pain.  Gastrointestinal: Negative for nausea, vomiting, abdominal pain, diarrhea, constipation, blood in stool and abdominal distention.  Genitourinary: Negative for dysuria, urgency, frequency, vaginal discharge, enuresis, difficulty urinating, genital sores and pelvic pain.       Objective:   Physical Exam  Constitutional: She is oriented to person, place, and time. She appears well-developed. No distress.  HENT:  Right Ear: External ear normal.  Left Ear: External ear normal.  Mouth/Throat: Oropharynx is clear and moist.  Neck: Normal range of motion. Neck supple. No tracheal deviation present. No thyromegaly present.  Cardiovascular: Normal rate, regular rhythm and normal heart sounds.  Exam reveals no gallop.   No murmur heard. Pulmonary/Chest: Effort normal and breath sounds normal.  Abdominal: Soft. She exhibits no distension. There is no tenderness.  Genitourinary: Vagina normal. No vaginal discharge found.  External GU: no rashes or lesions. Vagina: no discharge. Bimanual exam: no tenderness or obvious masses; no rectal exam today; just had colonoscopy.   Musculoskeletal: She exhibits no edema.  Lymphadenopathy:    She has no cervical  adenopathy.  Neurological: She is alert and oriented to person, place, and time.  Skin: Skin is warm and dry. No rash noted.  Psychiatric: She has a normal mood and affect. Her behavior is normal.  Vitals reviewed. Breast exam: no masses; axillae no adenopathy.        Assessment & Plan:  Routine general medical examination at a health care facility - Plan: Lipid panel, Hepatic function panel, Basic metabolic panel, TSH  Other fatigue - Plan: Lipid panel, Hepatic function panel, Basic metabolic panel, TSH  High risk medication use - Plan: Lipid panel, Hepatic function panel, Basic metabolic panel, TSH  Meds ordered this encounter  Medications  . albuterol (PROVENTIL HFA;VENTOLIN HFA) 108 (90 Base) MCG/ACT inhaler    Sig: Inhale into the lungs every 6 (six) hours as needed for wheezing or shortness of breath.  . levocetirizine (XYZAL) 5 MG tablet    Sig: Take 5 mg by mouth every evening.  . mometasone-formoterol (DULERA) 100-5 MCG/ACT AERO    Sig: Inhale 2 puffs into the lungs 2 (two) times daily.  . Budesonide (RHINOCORT ALLERGY NA)    Sig: Place into the nose.  Marland Kitchen ALPRAZolam (XANAX) 0.5 MG tablet    Sig: TAKE ONE TABLET BY MOUTH TWICE DAILY AS NEEDED --  **MAY  FILL  MONTHLY**    Dispense:  30 tablet    Refill:  2    [facility] [place]    Order Specific Question:  Supervising Provider    Answer:  [name] [[id number]]   Recommend walking program, weight loss and continue daily vitamin D/calcium.  Return in about 1 year (around 11/01/2016) for physical.

## 2015-11-23 ENCOUNTER — Ambulatory Visit (INDEPENDENT_AMBULATORY_CARE_PROVIDER_SITE_OTHER): Payer: PPO | Admitting: Allergy and Immunology

## 2015-11-23 VITALS — BP 140/80 | HR 76 | Resp 16

## 2015-11-23 DIAGNOSIS — R05 Cough: Secondary | ICD-10-CM | POA: Diagnosis not present

## 2015-11-23 DIAGNOSIS — R062 Wheezing: Secondary | ICD-10-CM

## 2015-11-23 DIAGNOSIS — R059 Cough, unspecified: Secondary | ICD-10-CM

## 2015-11-23 MED ORDER — BUDESONIDE-FORMOTEROL FUMARATE 160-4.5 MCG/ACT IN AERO: 2.0000 | INHALATION_SPRAY | Freq: Two times a day (BID) | RESPIRATORY_TRACT | Status: DC

## 2015-11-23 NOTE — Progress Notes (Signed)
     FOLLOW UP NOTE  RE: LURDES STULTZ MRN: [id number] DOB: 1955-09-02 [facility] [place] [address] [place] [address] Date of Office Visit: 11/23/2015  Subjective:  Sharon [name] is a 60 y.o. female who presents today for Urticaria and Asthma  Assessment:   1. Cough   2. Wheeze    Plan:   Meds ordered this encounter  Medications  . budesonide-formoterol (SYMBICORT) 160-4.5 MCG/ACT inhaler    Sig: Inhale 2 puffs into the lungs 2 (two) times daily.    Dispense:  1 Inhaler    Refill:  3   Patient Instructions  1.  Change to Symbicort 140mcg 2 puffs twice daily, rinse, gargle and spit with water after use, once Dulera canister is empty. 2.  Continue Xyzal, Rhinocort daily. 3.  Saline nasal wash each evening and after any outdoor activity. 4.  Albuterol HFA/neb as needed.---Call with any recurring use. 5.  Avoidance of lamb and pork for now --emergency action plan in place.   6.  Monitor closely for any recurring skin changes and document environment, exposure, ingestion, and activity--take pictures and call office for up-to-date.  7.  Follow-up in 4-6 months or sooner if needed.  HPI: [name] returns to the office in follow-up of asthma and difficulty with hives.  Since her initial visit, she is found great benefit on Dulera but her insurance company is not covering this medication.  She reports restful sleep symptom-free activity/exercise (including walking indoors at the mall 35 minutes) and no recent albuterol use.  Denies any cough, wheeze, congestion, or difficulty breathing.  She feels her skin is much improved and she is avoiding lamb at this time.  Denies any acute itching episodes, rashes, redness or hives.  Denies ED or urgent care visits, prednisone or antibiotic courses. Reports sleep and activity are normal.  Kiira has a current medication list which includes the following prescription(s): albuterol neb, albuterol, budesonide,  calcium carbonate-vitamin d, epinephrine, fish oil-omega-3 fatty acids, furosemide, levocetirizine, methocarbamol, mometasone-formoterol, multivitamin with minerals, nabumetone, pantoprazole, potassium chloride, ranitidine  Drug Allergies: Allergies  Allergen Reactions  . Adhesive [Tape] Hives and Dermatitis    Can tolerate tegaderm  . Augmentin [Amoxicillin-Pot Clavulanate] Diarrhea and Nausea Only  . Latex Hives    Whelps  . Neomycin-Bacitracin Zn-Polymyx Dermatitis    Objective:   Filed Vitals:   11/23/15 1007  BP: 140/80  Pulse: 76  Resp: 16   Physical Exam  Constitutional: She is well-developed, well-nourished, and in no distress.  HENT:  Head: Atraumatic.  Right Ear: Tympanic membrane and ear canal normal.  Left Ear: Tympanic membrane and ear canal normal.  Nose: Mucosal edema present. No rhinorrhea. No epistaxis.  Mouth/Throat: Oropharynx is clear and moist and mucous membranes are normal. No oropharyngeal exudate, posterior oropharyngeal edema or posterior oropharyngeal erythema.  Neck: Neck supple.  Cardiovascular: Normal rate, S1 normal and S2 normal.   No murmur heard. Pulmonary/Chest: Effort normal. She has no wheezes. She has no rhonchi. She has no rales.  Lymphadenopathy:    She has no cervical adenopathy.  Skin: No cyanosis. Nails show no clubbing.  Clear,  no rash or lesions.   Diagnostics: Spirometry:  FVC 2.06--77%, FEV1 1.65--76%.  Labs:  Minimal specific IgE reactivity to pork and lamb; Negative specific IgE for raspberry, tree nut, peanut, cucumber, cinnamon, beef and alpha gal.    Dinna Severs M. Ishmael Holter, MD  cc: Mickie Hillier, MD

## 2015-11-26 NOTE — Patient Instructions (Signed)
   Change to Symbicort 2 puffs twice daily, rinse, gargle and spit with water after use.  Continue Xyzal, Rhinocort daily.  Saline nasal wash each evening and after any outdoor activity.  Albuterol HFA as needed.--- Call with any recurring use.  Follow-up in 4-6 months or sooner if needed.

## 2015-12-07 ENCOUNTER — Other Ambulatory Visit: Payer: Self-pay | Admitting: Family Medicine

## 2015-12-25 ENCOUNTER — Other Ambulatory Visit: Payer: Self-pay | Admitting: Nurse Practitioner

## 2015-12-25 MED ORDER — HYDROCODONE-ACETAMINOPHEN 10-325 MG PO TABS: 1.0000 | ORAL_TABLET | Freq: Four times a day (QID) | ORAL | 0 refills | Status: DC | PRN

## 2016-01-09 ENCOUNTER — Other Ambulatory Visit: Payer: Self-pay | Admitting: Family Medicine

## 2016-02-02 ENCOUNTER — Encounter: Payer: Self-pay | Admitting: Nurse Practitioner

## 2016-02-02 ENCOUNTER — Ambulatory Visit (INDEPENDENT_AMBULATORY_CARE_PROVIDER_SITE_OTHER): Payer: PPO | Admitting: Nurse Practitioner

## 2016-02-02 VITALS — BP 144/82 | Temp 98.6°F | Ht 61.0 in | Wt 272.5 lb

## 2016-02-02 DIAGNOSIS — J069 Acute upper respiratory infection, unspecified: Secondary | ICD-10-CM

## 2016-02-02 DIAGNOSIS — J4521 Mild intermittent asthma with (acute) exacerbation: Secondary | ICD-10-CM

## 2016-02-02 DIAGNOSIS — B9689 Other specified bacterial agents as the cause of diseases classified elsewhere: Secondary | ICD-10-CM

## 2016-02-02 MED ORDER — PREDNISONE 20 MG PO TABS: ORAL_TABLET | ORAL | 0 refills | Status: DC

## 2016-02-02 MED ORDER — CEFPROZIL 500 MG PO TABS: 500.0000 mg | ORAL_TABLET | Freq: Two times a day (BID) | ORAL | 0 refills | Status: DC

## 2016-02-02 NOTE — Progress Notes (Signed)
Subjective:  Presents for complaints of cough sore throat and runny nose over the past 6 days. Now experiencing chest tightness with wheezing, using her albuterol at least 4-6 times per day. Had to use her nebulizer twice last night due to wheezing. Frequent cough. No fever. Left ear pressure. Producing a small amount of clear mucus.  Objective:   BP (!) 144/82   Temp 98.6 F (37 C) (Oral)   Ht 5\' 1"  (1.549 m)   Wt 272 lb 8 oz (123.6 kg)   BMI 51.49 kg/m  NAD. Alert, oriented. TMs clear effusion, no erythema. Pharynx injected. Neck supple with mild soft anterior adenopathy. Lungs clear. No wheezing or tachypnea. Heart regular rate rhythm.  Assessment: Bacterial upper respiratory infection  Asthma, mild intermittent, with acute exacerbation  Plan:  Meds ordered this encounter  Medications  . cefPROZIL (CEFZIL) 500 MG tablet    Sig: Take 1 tablet (500 mg total) by mouth 2 (two) times daily.    Dispense:  20 tablet    Refill:  0    Order Specific Question:   Supervising Provider    Answer:   [name] [[id number]]  . predniSONE (DELTASONE) 20 MG tablet    Sig: 3 po qd x 3 d then 2 po qd x 3 d then 1 po qd x 3 d    Dispense:  18 tablet    Refill:  0    Order Specific Question:   Supervising Provider    Answer:   [name] [[id number]]   OTC meds as directed for congestion and cough. Callback in 72 hours if no improvement in symptoms, call or go to ED over the weekend if worse.

## 2016-02-27 ENCOUNTER — Other Ambulatory Visit: Payer: Self-pay | Admitting: Family Medicine

## 2016-03-04 ENCOUNTER — Encounter: Payer: Self-pay | Admitting: Family Medicine

## 2016-03-04 ENCOUNTER — Ambulatory Visit (INDEPENDENT_AMBULATORY_CARE_PROVIDER_SITE_OTHER): Payer: PPO | Admitting: Family Medicine

## 2016-03-04 VITALS — BP 152/80 | Temp 98.5°F | Ht 61.0 in | Wt 277.4 lb

## 2016-03-04 DIAGNOSIS — Z23 Encounter for immunization: Secondary | ICD-10-CM

## 2016-03-04 DIAGNOSIS — J4520 Mild intermittent asthma, uncomplicated: Secondary | ICD-10-CM | POA: Diagnosis not present

## 2016-03-04 DIAGNOSIS — R609 Edema, unspecified: Secondary | ICD-10-CM

## 2016-03-04 MED ORDER — FUROSEMIDE 40 MG PO TABS: ORAL_TABLET | ORAL | 3 refills | Status: DC

## 2016-03-04 NOTE — Progress Notes (Signed)
   Subjective:    Patient ID: Sharon [name], female    DOB: 1955-07-10, 60 y.o.   MRN: [id number]  Shortness of Breath  This is a new problem. The current episode started in the past 7 days. Associated symptoms include leg swelling and wheezing. The symptoms are aggravated by any activity. Associated symptoms comments: Cough.   Hx of asthma in the past, now dr hixk thinks pt has asthma  Walking across the house leads to sob  Yesterday had substantial swelling of ankles  Pt on symbicort and on alergy meds and four times per day on the inhaler  Patient has concerns of back pain.  Strong fam hx of heart problems an dpt has htn issues in the family   Review of Systems  Respiratory: Positive for shortness of breath and wheezing.   Cardiovascular: Positive for leg swelling.       Objective:   Physical Exam Alert, substantial morbid obesity, vitals stable HEENT normal lungs no wheezes currently no crackles no tachypnea heart regular in rhythm ankles 1+ edema bilateral       Assessment & Plan:  Impression dyspnea likely multi-factorial. Patient worried about heart with swelling in the ankles. I think the ankle swelling represents substantial venous stasis more than anything. Patient also has an obstructive element with her wheezing. In addition she has a restrictive element with her tremendous morbid obesity plan patient needs to get blood work as ordered earlier this summer. Cut down salt intake. Increase Lasix to one and half tablets daily. Due to see the pulmonary/allergy folks shortly so I will leave it them to discuss further interventions with breathing. If BNP negative for near-normal no need for major cardiac workup. Discussed at length. 25 minutes spent most in discussion patient reluctant to consider interventions for morbid obesity

## 2016-03-05 ENCOUNTER — Other Ambulatory Visit: Payer: Self-pay | Admitting: Family Medicine

## 2016-03-05 DIAGNOSIS — Z79899 Other long term (current) drug therapy: Secondary | ICD-10-CM | POA: Diagnosis not present

## 2016-03-05 DIAGNOSIS — Z0000 Encounter for general adult medical examination without abnormal findings: Secondary | ICD-10-CM | POA: Diagnosis not present

## 2016-03-05 DIAGNOSIS — R609 Edema, unspecified: Secondary | ICD-10-CM | POA: Diagnosis not present

## 2016-03-05 DIAGNOSIS — R5383 Other fatigue: Secondary | ICD-10-CM | POA: Diagnosis not present

## 2016-03-05 LAB — HEPATIC FUNCTION PANEL
ALBUMIN: 3.7 g/dL (ref 3.6–5.1)
ALT: 17 U/L (ref 6–29)
AST: 17 U/L (ref 10–35)
Alkaline Phosphatase: 78 U/L (ref 33–130)
BILIRUBIN INDIRECT: 0.3 mg/dL (ref 0.2–1.2)
Bilirubin, Direct: 0.1 mg/dL (ref <=0.2)
TOTAL PROTEIN: 6.5 g/dL (ref 6.1–8.1)
Total Bilirubin: 0.4 mg/dL (ref 0.2–1.2)

## 2016-03-05 LAB — LIPID PANEL
CHOLESTEROL: 162 mg/dL (ref 125–200)
HDL: 58 mg/dL (ref >=46)
LDL CALC: 79 mg/dL (ref <130)
TRIGLYCERIDES: 126 mg/dL (ref <150)
Total CHOL/HDL Ratio: 2.8 Ratio (ref <=5.0)
VLDL: 25 mg/dL (ref <30)

## 2016-03-05 LAB — BASIC METABOLIC PANEL
BUN: 9 mg/dL (ref 7–25)
CHLORIDE: 105 mmol/L (ref 98–110)
CO2: 28 mmol/L (ref 20–31)
Calcium: 9.2 mg/dL (ref 8.6–10.4)
Creat: 0.68 mg/dL (ref 0.50–0.99)
Glucose, Bld: 100 mg/dL — ABNORMAL HIGH (ref 65–99)
POTASSIUM: 4.4 mmol/L (ref 3.5–5.3)
SODIUM: 140 mmol/L (ref 135–146)

## 2016-03-05 LAB — TSH: TSH: 1.74 mIU/L

## 2016-03-06 LAB — BRAIN NATRIURETIC PEPTIDE: BRAIN NATRIURETIC PEPTIDE: 70.7 pg/mL (ref <100)

## 2016-03-10 ENCOUNTER — Encounter: Payer: Self-pay | Admitting: Family Medicine

## 2016-03-21 ENCOUNTER — Encounter: Payer: Self-pay | Admitting: Allergy & Immunology

## 2016-03-21 ENCOUNTER — Ambulatory Visit (INDEPENDENT_AMBULATORY_CARE_PROVIDER_SITE_OTHER): Payer: PPO | Admitting: Allergy & Immunology

## 2016-03-21 VITALS — BP 120/78 | HR 72 | Temp 98.3°F | Resp 16

## 2016-03-21 DIAGNOSIS — J3089 Other allergic rhinitis: Secondary | ICD-10-CM | POA: Diagnosis not present

## 2016-03-21 DIAGNOSIS — L508 Other urticaria: Secondary | ICD-10-CM

## 2016-03-21 DIAGNOSIS — J4540 Moderate persistent asthma, uncomplicated: Secondary | ICD-10-CM

## 2016-03-21 MED ORDER — FLUTICASONE-SALMETEROL 230-21 MCG/ACT IN AERO: 2.0000 | INHALATION_SPRAY | Freq: Two times a day (BID) | RESPIRATORY_TRACT | 5 refills | Status: DC

## 2016-03-21 NOTE — Patient Instructions (Signed)
1. 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 - EpiPen up to date.  2. Chronic allergic rhinitis - Continue with Rhinocort two sprays per nostril daily. - Continue with Allegra daily.  3. Moderate persistent asthma, uncomplicated - Change to Advair 230/21 two puffs in the morning and two puffs at night. - Use your spacer every time to make the medication go deeper into your lungs.  - Let us know how this works. - We could consider starting a monthly injectable medication to help with your asthma.  3. Return in about 4 weeks (around 04/18/2016).  Please inform us of any Emergency Department visits, hospitalizations, or changes in symptoms. Call us before going to the ED for breathing or allergy symptoms since we might be able to fit you in for a sick visit. Feel free to contact us anytime with any questions, problems, or concerns.  It was a pleasure to meet you today!   Websites that have reliable patient information: 1. American Academy of Asthma, Allergy, and Immunology: www.aaaai.org 2. Food Allergy Research and Education (FARE): foodallergy.org 3. Mothers of Asthmatics: http://www.asthmacommunitynetwork.org 4. American College of Allergy, Asthma, and Immunology: www.acaai.org

## 2016-03-21 NOTE — Progress Notes (Signed)
FOLLOW UP  Date of Service/Encounter:  03/21/16   Assessment:   Moderate persistent asthma, uncomplicated - Plan: Spirometry with Graph  Chronic urticaria  Chronic nonseasonal allergic rhinitis due to fungal spores   Asthma Reportables:  Severity: moderate persistent  Risk: low Control: not well controlled  Seasonal Influenza Vaccine: no but encouraged     Plan/Recommendations:   1. 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 - EpiPen up to date. - Urticaria well controlled as long as she avoids the pork and the lamb.   2. Chronic allergic rhinitis - Continue with Rhinocort two sprays per nostril daily. - Continue with Allegra daily.  3. Moderate persistent asthma, uncomplicated - Change to Advair 230/21 two puffs in the morning and two puffs at night. - Use your spacer every time to make the medication go deeper into your lungs.  - Let us know how this works. - We could consider starting a monthly injectable medication to help with your asthma, but we would need to get some lab work beforehand. - If the Advair does not work, we could consider starting [place].  3. Return in about 4 weeks (around 04/18/2016).   Subjective:   Sharon [name] is a 60 y.o. female presenting today for follow up of  Chief Complaint  Patient presents with  . Cough    Patient feels Symbicort does not work as well as Dentist.  Insurance will not cover [employer]  . Wheezing    Increased flares and wheezing with Symbicort  . Medication Management    Stopped Xyzal and started Allegra.  Wants to discuss Symbicort  .  Sharon [name] has a history of the following: Patient Active Problem List   Diagnosis Date Noted  . History of colonic polyps   . Diverticulosis of colon without hemorrhage   . Asthma, mild intermittent 08/30/2015  . Encounter for screening colonoscopy 08/25/2015  . Wheezing 08/08/2015  . GERD (gastroesophageal reflux  disease) 08/08/2015  . Morbid obesity ([place]) 07/14/2013  . Status post right knee replacement 06/17/2013  . Status post left knee replacement 06/17/2013  . DDD (degenerative disc disease), lumbosacral 10/06/2012  . Hip pain 10/06/2012  . Radicular leg pain 10/06/2012  . Bursitis of hip, right 06/25/2012  . Trochanteric bursitis of both hips 09/11/2011  . S/P total knee replacement 07/03/2011  . Knee pain 03/20/2011  . OA (osteoarthritis) of knee 12/12/2010  . Mononeuritis of leg 10/24/2010  . Chronic pain 10/24/2010  . OTHER POSTSURGICAL STATUS OTHER 07/24/2010  . STRESS FRACTURE, TIBIA 04/11/2010  . TENOSYNOVITIS OF FOOT AND ANKLE 02/15/2010  . LOOSE BODY-KNEE 01/10/2010  . DERANGEMENT MENISCUS 01/01/2010  . [place] HEAD 01/01/2010  . SHOULDER PAIN 04/19/2009  . IMPINGEMENT SYNDROME 04/19/2009  . HERNIATED LUMBOSACRAL DISC 09/01/2007  . UNSPECIFIED NEURALGIA NEURITIS AND RADICULITIS 07/09/2007  . TOTAL KNEE FOLLOW-UP 06/25/2007  . OSTEOARTHROSIS, LOCAL, PRIMARY, LOWER LEG 03/24/2007  . Osteoarthrosis, unspecified whether generalized or localized, lower leg 03/09/2007  . KNEE PAIN 02/02/2007  . TEAR MEDIAL MENISCUS 02/02/2007    History obtained from: chart review and patient.  Sharon [name] was referred by Mickie Hillier, MD.     Sharon [name] is a 60 y.o. female presenting for a follow up visit. Sharon [name] was last seen in June 2017 by Dr. Ishmael Holter, who has since left the practice. At the time, she was doing well with [employer] [facility] but her insurance company was not covering it. She was changed to  Symbicort 160 two puffs in the morning and 2000 evening. She was continued on Xyzal as well as Rhinocort. 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  Since the last visit, she reports that that the Symbicort is not "holding her symptoms". She is having problems with walking from one end of the house to another. She is trying to become more active. The [facility] worked  much better and treated her symptoms better than others. She has never been on Advair in the past. She has not been on Breo in the past. She has been using her rescue inhaler often, up to 2-3 times per day. She has not needed prendisone or ED visits since the last time.   She changed from Xyzal to [place] which is working better. She was having fatigue from the Xyzal. She is also on the Rhinocort two sprays per nostril daily.   She continues to avoid pork and lamb. It showed up on the alpha gal panel but her clinical presentation made her think that alpha gal is involved. She is avoiding pork and lamb. She is eating some beef but mostly chicken and beef. She does have an EpiPen which up to date.   Health is otherwise "OK". She knows that she needs to get rid of some extra weight. She does not have diabetes at this time which is why she wants to lose some weight.   Asthma/Respiratory Symptom History:   Allergic Rhinitis Symptom History:    Food Allergy Symptom History:   Otherwise, there have been no changes to her past medical history, surgical history, family history, or social history.    Review of Systems: a 14-point review of systems is pertinent for what is mentioned in HPI.  Otherwise, all other systems were negative. Constitutional: negative other than that listed in the HPI Eyes: negative other than that listed in the HPI Ears, nose, mouth, throat, and face: negative other than that listed in the HPI Respiratory: negative other than that listed in the HPI Cardiovascular: negative other than that listed in the HPI Gastrointestinal: negative other than that listed in the HPI Genitourinary: negative other than that listed in the HPI Integument: negative other than that listed in the HPI Hematologic: negative other than that listed in the HPI Musculoskeletal: negative other than that listed in the HPI Neurological: negative other than that listed in the HPI Allergy/Immunologic:  negative other than that listed in the HPI    Objective:   Blood pressure 120/78, pulse 72, temperature 98.3 F (36.8 C), temperature source Oral, resp. rate 16, SpO2 97 %. There is no height or weight on file to calculate BMI.   Physical Exam:  General: Alert, interactive, in no acute distress. HEENT: TMs pearly gray, turbinates mildly edematous with clear discharge, post-pharynx erythematous. Neck: Supple without thyromegaly. Lungs: Clear to auscultation without wheezing, rhonchi or rales. No increased work of breathing. CV: Normal S1/S2, no murmurs. Capillary refill <2 seconds.  Abdomen: Nondistended, nontender. No guarding or rebound tenderness. Bowel sounds hyperactive  Skin: Warm and dry, without lesions or rashes. Extremities:  No clubbing, cyanosis or edema. Neuro:   Grossly intact.  Diagnostic studies:  Spirometry: results abnormal (FEV1: 1.70/79%, FVC: 2.13/80%, FEV1/FVC: 80%).    Spirometry consistent with possible restrictive disease. Compared to her values in the last visit, her FEV1 and FVC have remained stable.   Allergy Studies: None    Salvatore Marvel, MD [facility]

## 2016-03-23 ENCOUNTER — Other Ambulatory Visit: Payer: Self-pay | Admitting: Nurse Practitioner

## 2016-03-29 ENCOUNTER — Other Ambulatory Visit: Payer: Self-pay | Admitting: Family Medicine

## 2016-04-10 ENCOUNTER — Encounter: Payer: Self-pay | Admitting: Nurse Practitioner

## 2016-04-10 ENCOUNTER — Ambulatory Visit (INDEPENDENT_AMBULATORY_CARE_PROVIDER_SITE_OTHER): Payer: PPO | Admitting: Nurse Practitioner

## 2016-04-10 VITALS — BP 132/88 | Temp 98.7°F | Ht 61.0 in | Wt 271.0 lb

## 2016-04-10 DIAGNOSIS — Z79899 Other long term (current) drug therapy: Secondary | ICD-10-CM

## 2016-04-10 DIAGNOSIS — J4521 Mild intermittent asthma with (acute) exacerbation: Secondary | ICD-10-CM

## 2016-04-10 DIAGNOSIS — J069 Acute upper respiratory infection, unspecified: Secondary | ICD-10-CM | POA: Diagnosis not present

## 2016-04-10 MED ORDER — LEVOFLOXACIN 500 MG PO TABS: 500.0000 mg | ORAL_TABLET | Freq: Every day | ORAL | 0 refills | Status: DC

## 2016-04-10 MED ORDER — LORCASERIN HCL 10 MG PO TABS: ORAL_TABLET | ORAL | 2 refills | Status: DC

## 2016-04-10 MED ORDER — PREDNISONE 20 MG PO TABS: ORAL_TABLET | ORAL | 0 refills | Status: DC

## 2016-04-11 ENCOUNTER — Encounter: Payer: Self-pay | Admitting: Nurse Practitioner

## 2016-04-11 ENCOUNTER — Other Ambulatory Visit: Payer: Self-pay | Admitting: Nurse Practitioner

## 2016-04-11 LAB — POTASSIUM: POTASSIUM: 4.1 mmol/L (ref 3.5–5.2)

## 2016-04-11 MED ORDER — PHENTERMINE HCL 37.5 MG PO TABS: 37.5000 mg | ORAL_TABLET | Freq: Every day | ORAL | 2 refills | Status: DC

## 2016-04-11 NOTE — Progress Notes (Signed)
Subjective:  Presents for follow-up. Patient had severe cramping with her Lasix was increased to 80 mg. Greatly helped her swelling but could not tolerate. Has since gone back to her 40 mg dose. See previous note. Patient is exploring the option of bariatric surgery. Limited activity due to chronic orthopedic issues but is looking into chair aerobics. Does not drink any sugary beverages. Overall fairly healthy diet. Her goal is to decrease her bread intake. Also patient has had worsening cough over the past 5 days. No fever. Sore throat. Frequent cough worse at nighttime. Nonproductive. Using her albuterol on average of 2-3 times per day. Her last use was less than 2 hours ago. Symptoms are better after using nebulizer. Continues to see asthma and allergy specialist for adjustment of her medications. 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 Has been on several courses of antibiotics this fall.  Objective:   BP 132/88   Temp 98.7 F (37.1 C) (Oral)   Ht 5\' 1"  (1.549 m)   Wt 271 lb (122.9 kg)   BMI 51.21 kg/m  NAD. Alert, oriented. TMs clear effusion bilateral, no erythema. Pharynx injected with PND noted. Neck supple with mild soft anterior adenopathy. Lungs clear. No wheezing or tachypnea. Heart regular rate rhythm. Significant central obesity.  Assessment:  Problem List Items Addressed This Visit      Respiratory   Asthma, mild intermittent - Primary   Relevant Medications   predniSONE (DELTASONE) 20 MG tablet     Other   Morbid obesity (HCC)   Relevant Medications   Lorcaserin HCl 10 MG TABS    Other Visit Diagnoses    Acute upper respiratory infection       High risk medication use       Relevant Orders   Potassium (Completed)     Plan:  Meds ordered this encounter  Medications  . levofloxacin (LEVAQUIN) 500 MG tablet    Sig: Take 1 tablet (500 mg total) by mouth daily.    Dispense:  10 tablet    Refill:  0    Order Specific Question:   Supervising Provider    Answer:   [name] [[id number]]  . predniSONE (DELTASONE) 20 MG tablet    Sig: 3 po qd x 3 d then 2 po qd x 3 d then 1 po qd x 3 d    Dispense:  18 tablet    Refill:  0    Order Specific Question:   Supervising Provider    Answer:   [name] [[id number]]  . Lorcaserin HCl 10 MG TABS    Sig: One po BID    Dispense:  60 tablet    Refill:  2    Order Specific Question:   Supervising Provider    Answer:   [name] [[id number]]   Start Levaquin as directed. Patient is aware of the risk of potential tendon problems. Given written prescription for prednisone in case it is needed over the next several days. Would like to start oral medication for weight loss, start billed feet as directed. DC med and call if any adverse effects. Encourage patient to get at least 15-30 minutes of walking per day and possibly start chair aerobics. Reduce simple carbs in her diet such as bread. Follow-up in one month if needed for surgery workup. Call back sooner if respiratory issues do not improve.

## 2016-04-24 ENCOUNTER — Encounter: Payer: Self-pay | Admitting: Nurse Practitioner

## 2016-04-25 ENCOUNTER — Encounter: Payer: Self-pay | Admitting: Allergy & Immunology

## 2016-04-25 ENCOUNTER — Ambulatory Visit (INDEPENDENT_AMBULATORY_CARE_PROVIDER_SITE_OTHER): Payer: PPO | Admitting: Allergy & Immunology

## 2016-04-25 VITALS — BP 134/82 | HR 90 | Temp 98.0°F | Resp 18 | Ht 60.0 in | Wt 270.8 lb

## 2016-04-25 DIAGNOSIS — J208 Acute bronchitis due to other specified organisms: Secondary | ICD-10-CM

## 2016-04-25 DIAGNOSIS — J3089 Other allergic rhinitis: Secondary | ICD-10-CM | POA: Diagnosis not present

## 2016-04-25 DIAGNOSIS — J4540 Moderate persistent asthma, uncomplicated: Secondary | ICD-10-CM

## 2016-04-25 MED ORDER — CLARITHROMYCIN 500 MG PO TABS: 500.0000 mg | ORAL_TABLET | Freq: Two times a day (BID) | ORAL | 0 refills | Status: AC

## 2016-04-25 NOTE — Progress Notes (Signed)
FOLLOW UP  Date of Service/Encounter:  04/25/16   Assessment:   Acute bronchitis due to other specified organisms  Moderate persistent asthma, uncomplicated  Chronic nonseasonal allergic rhinitis   Asthma Reportables:  Severity: moderate persistent  Risk: low Control: well controlled  Seasonal Influenza Vaccine: yes    Plan/Recommendations:   1. Acute bronchitis - partially treated with levaquin - Start clarithromycin (BIAXIN) 500mg  tablet twice daily for 14 days. - Fill the prednisone if you feel that your condition is worsening. - Lung function looks good today.   2. Moderate persistent asthma, uncomplicated - Lung function looked good today. - We did give an albuterol nebulizer treamtent with subjective improvement in symptoms. - Daily controller medication(s): Advair 230/21 two puffs twice daily with spacer - Rescue medications: Ventolin 4 puffs every 4-6 hours as needed - Asthma control goals:  * Full participation in all desired activities (may need albuterol before activity) * Albuterol use two time or less a week on average (not counting use with activity) * Cough interfering with sleep two time or less a month * Oral steroids no more than once a year * No hospitalizations  3. Chronic allergic rhinitis - Continue with Rhinocort two sprays per nostril once daily. - Continue with Allegra 180mg  one tablet daily.  4. Return in about 3 months (around 07/24/2016).     Subjective:   Sharon [name] is a 60 y.o. female presenting today for follow up of  Chief Complaint  Patient presents with  . Asthma    Follow up  . Allergic Rhinitis   . Cough    Wheezing      Laural [name] has a history of the following: Patient Active Problem List   Diagnosis Date Noted  . History of colonic polyps   . Diverticulosis of colon without hemorrhage   . Asthma, mild intermittent 08/30/2015  . Encounter for screening colonoscopy 08/25/2015  . Wheezing  08/08/2015  . GERD (gastroesophageal reflux disease) 08/08/2015  . Morbid obesity ([place]) 07/14/2013  . Status post right knee replacement 06/17/2013  . Status post left knee replacement 06/17/2013  . DDD (degenerative disc disease), lumbosacral 10/06/2012  . Hip pain 10/06/2012  . Radicular leg pain 10/06/2012  . Bursitis of hip, right 06/25/2012  . Trochanteric bursitis of both hips 09/11/2011  . S/P total knee replacement 07/03/2011  . Knee pain 03/20/2011  . OA (osteoarthritis) of knee 12/12/2010  . Mononeuritis of leg 10/24/2010  . Chronic pain 10/24/2010  . OTHER POSTSURGICAL STATUS OTHER 07/24/2010  . STRESS FRACTURE, TIBIA 04/11/2010  . TENOSYNOVITIS OF FOOT AND ANKLE 02/15/2010  . LOOSE BODY-KNEE 01/10/2010  . DERANGEMENT MENISCUS 01/01/2010  . [place] HEAD 01/01/2010  . SHOULDER PAIN 04/19/2009  . IMPINGEMENT SYNDROME 04/19/2009  . HERNIATED LUMBOSACRAL DISC 09/01/2007  . UNSPECIFIED NEURALGIA NEURITIS AND RADICULITIS 07/09/2007  . TOTAL KNEE FOLLOW-UP 06/25/2007  . OSTEOARTHROSIS, LOCAL, PRIMARY, LOWER LEG 03/24/2007  . Osteoarthrosis, unspecified whether generalized or localized, lower leg 03/09/2007  . KNEE PAIN 02/02/2007  . TEAR MEDIAL MENISCUS 02/02/2007   History obtained from: chart review and patient.  Laural [name] was referred by Mickie Hillier, MD.     Sharon [name] is a 60 y.o. female presenting for a sick visit. I last saw the patient approximately 1 month ago. At that time, her asthma was not under good control, therefore we changed her to higher dose Advair 230/21 two puffs in the morning and two puffs at night. Prior to this, she was  on Symbicort which was not working as well as Dentist, which had worked well in the past. Unfortunately, her insurance stopped covering it. I also recommend the use of a spacer in order to get the medication deeper into her lungs. We did talk about doing Biologics as means of controlling her asthma as well. She does have a  history of allergic rhinitis and is on Rhinocort 2 sprays per nostril daily as well as Allegra daily. She has a history of chronic urticaria with lamb and pork sensitizations  Since the last visit, she has done well. She feels that the Advair has been working well. She has not needed her Ventolin at all. Sharon [name]'s asthma has been well controlled. She has not required rescue medication, experienced nocturnal awakenings due to lower respiratory symptoms, nor have activities of daily living been limited. The only problem that she has had was a few weeks ago when she was diagnosed with bronchitis by her PCP. She was started on Levaquin which she took for ten days without resolution of her symptoms. She continues to have green nasal drischarge and is using her albuterol around the clock around two times per day. Her PCP also gave her a prescription for prednisone which she has not started yet.   Otherwise, there have been no changes to her past medical history, surgical history, family history, or social history.    Review of Systems: a 14-point review of systems is pertinent for what is mentioned in HPI.  Otherwise, all other systems were negative. Constitutional: negative other than that listed in the HPI Eyes: negative other than that listed in the HPI Ears, nose, mouth, throat, and face: negative other than that listed in the HPI Respiratory: negative other than that listed in the HPI Cardiovascular: negative other than that listed in the HPI Gastrointestinal: negative other than that listed in the HPI Genitourinary: negative other than that listed in the HPI Integument: negative other than that listed in the HPI Hematologic: negative other than that listed in the HPI Musculoskeletal: negative other than that listed in the HPI Neurological: negative other than that listed in the HPI Allergy/Immunologic: negative other than that listed in the HPI    Objective:   Blood pressure 134/82,  pulse 90, temperature 98 F (36.7 C), temperature source Oral, resp. rate 18, height 5' (1.524 m), weight 270 lb 12.8 oz (122.8 kg), SpO2 97 %. Body mass index is 52.89 kg/m.   Physical Exam:  General: Alert, interactive, in no acute distress. Cooperative with the exam. Fairly well appearing.  HEENT: TMs pearly gray, turbinates edematous and pale with thick discharge, post-pharynx erythematous, sinus discharge present (L>R) Neck: Supple without thyromegaly. Lungs: Clear to auscultation without wheezing, rhonchi or rales. No increased work of breathing. CV: Normal S1/S2, no murmurs. Capillary refill <2 seconds.  Abdomen: Nondistended, nontender. No guarding or rebound tenderness. Bowel sounds absent, faint, present in all fields, hypoactive and hyperactive  Skin: Warm and dry, without lesions or rashes. Extremities:  No clubbing, cyanosis or edema. Neuro:   Grossly intact.  Diagnostic studies:  Spirometry: results normal (FEV1: 1.72/83%, FVC: 2.16/85%, FEV1/FVC: 79%).    Spirometry consistent with normal pattern. DuoNeb nebulizer treatment given in clinic with no improvement, although there was a 14% improvement in the FEF25-75%. .  Allergy Studies: None     Salvatore Marvel, MD [facility] [place]

## 2016-04-25 NOTE — Patient Instructions (Addendum)
1. Acute bronchitis - Start clarithromycin (BIAXIN) 500mg  tablet twice daily for 14 days. - Fill the prednisone if you feel that your condition is worsening. - Lung function looks good today.   2. Moderate persistent asthma, uncomplicated - Lung function looked good today. - We did give an albuterol nebulizer treamtent with subjective improvement in symptoms. - Daily controller medication(s): Advair 230/21 two puffs twice daily with spacer - Rescue medications: Ventolin 4 puffs every 4-6 hours as needed - Asthma control goals:  * Full participation in all desired activities (may need albuterol before activity) * Albuterol use two time or less a week on average (not counting use with activity) * Cough interfering with sleep two time or less a month * Oral steroids no more than once a year * No hospitalizations  3. Chronic allergic rhinitis - Continue with Rhinocort two sprays per nostril once daily. - Continue with Allegra 180mg  one tablet daily.  4. Return in about 3 months (around 07/24/2016).  Please inform us of any Emergency Department visits, hospitalizations, or changes in symptoms. Call us before going to the ED for breathing or allergy symptoms since we might be able to fit you in for a sick visit. Feel free to contact us anytime with any questions, problems, or concerns.  It was a pleasure to see you again today! Have a wonderful holiday season!   Websites that have reliable patient information: 1. American Academy of Asthma, Allergy, and Immunology: www.aaaai.org 2. Food Allergy Research and Education (FARE): foodallergy.org 3. Mothers of Asthmatics: http://www.asthmacommunitynetwork.org 4. American College of Allergy, Asthma, and Immunology: www.acaai.org

## 2016-04-26 ENCOUNTER — Encounter: Payer: Self-pay | Admitting: Allergy & Immunology

## 2016-05-02 ENCOUNTER — Other Ambulatory Visit: Payer: Self-pay | Admitting: Family Medicine

## 2016-05-09 ENCOUNTER — Ambulatory Visit (INDEPENDENT_AMBULATORY_CARE_PROVIDER_SITE_OTHER): Payer: PPO | Admitting: Nurse Practitioner

## 2016-05-09 VITALS — BP 138/92 | Ht 60.0 in | Wt 269.4 lb

## 2016-05-09 DIAGNOSIS — J4521 Mild intermittent asthma with (acute) exacerbation: Secondary | ICD-10-CM

## 2016-05-10 ENCOUNTER — Encounter: Payer: Self-pay | Admitting: Nurse Practitioner

## 2016-05-10 NOTE — Progress Notes (Signed)
Subjective:  Presents for recheck. Has been seen by mouth asthma and allergy specialist. Recently completed a course of Biaxin, still has some cough but greatly improved. Has follow-up appointment in February. Currently on phentermine for weight loss, denies any side effects. No difficulty sleeping. No chest pain or palpitations. Activity is extremely limited due to her orthopedic surgeries.  Objective:   BP (!) 138/92   Ht 5' (1.524 m)   Wt 269 lb 6.4 oz (122.2 kg)   BMI 52.61 kg/m  NAD. Alert, oriented. Cheerful affect. Lungs clear. Heart regular rate rhythm.  Assessment:  Problem List Items Addressed This Visit      Respiratory   Asthma, mild intermittent - Primary     Other   Morbid obesity ([place])     Plan: Continue current medications as directed. Encouraged activity as tolerated. Follow-up with has been allergy specialist as planned. Otherwise routine follow-up.

## 2016-05-15 ENCOUNTER — Other Ambulatory Visit: Payer: Self-pay | Admitting: Nurse Practitioner

## 2016-06-06 ENCOUNTER — Other Ambulatory Visit: Payer: Self-pay | Admitting: Nurse Practitioner

## 2016-06-07 NOTE — Telephone Encounter (Signed)
Advair vs Flovent  LMTCB

## 2016-06-10 NOTE — Telephone Encounter (Signed)
Still waiting on her to call back. Sharon [name] to see.

## 2016-06-13 ENCOUNTER — Ambulatory Visit: Payer: PPO | Admitting: Nurse Practitioner

## 2016-06-14 ENCOUNTER — Other Ambulatory Visit: Payer: Self-pay | Admitting: Nurse Practitioner

## 2016-06-14 NOTE — Telephone Encounter (Signed)
Last filled 05/03/16 w/ no refills. Would you like to refill?

## 2016-06-25 ENCOUNTER — Encounter: Payer: Self-pay | Admitting: Nurse Practitioner

## 2016-07-11 ENCOUNTER — Ambulatory Visit (INDEPENDENT_AMBULATORY_CARE_PROVIDER_SITE_OTHER): Payer: PPO | Admitting: Allergy & Immunology

## 2016-07-11 VITALS — BP 128/80 | HR 91 | Temp 98.3°F | Resp 16 | Ht 60.0 in | Wt 269.0 lb

## 2016-07-11 DIAGNOSIS — J4540 Moderate persistent asthma, uncomplicated: Secondary | ICD-10-CM | POA: Diagnosis not present

## 2016-07-11 DIAGNOSIS — J3089 Other allergic rhinitis: Secondary | ICD-10-CM

## 2016-07-11 DIAGNOSIS — L508 Other urticaria: Secondary | ICD-10-CM | POA: Diagnosis not present

## 2016-07-11 MED ORDER — OMALIZUMAB 150 MG ~~LOC~~ SOLR
300.0000 mg | SUBCUTANEOUS | Status: DC
  Administered 2016-07-11: 300 mg via SUBCUTANEOUS

## 2016-07-11 NOTE — Progress Notes (Signed)
FOLLOW UP  Date of Service/Encounter:  07/11/16   Assessment:   Moderate persistent asthma, uncomplicated  Chronic allergic rhinitis (cat, dog, trees, weeds, molds, grass, dust mite)  Chronic urticaria with alpha-gal sensitivity   Asthma Reportables:  Severity: moderate persistent  Risk: high Control: not well controlled  Seasonal Influenza Vaccine: yes   Plan/Recommendations:   1. 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 - EpiPen up to date.  2. Chronic allergic rhinitis (cat, dog, trees, weeds, molds, grass, dust mite) - Continue with Rhinocort two sprays per nostril daily. - Continue with Allegra daily.  3. Moderate persistent asthma - not well controlled - Continue with Advair 230/21 two puffs in the morning and two puffs at night with a spacer. - We discussed the mechanisms of action and risks/benefits of Xolair. - Ms. Paluch decided to go ahead with Xolair submission. - Consent signed today and she did receive 300mg  IM today, tolerated well.  - IgE level 75 (May 2017) - I will send a notification to Tammy to start the submission process.   3. Return in about 3 months (around 10/08/2016).   Subjective:   AANYA KOFRON is a 61 y.o. female presenting today for follow up of  Chief Complaint  Patient presents with  . Asthma    55mth f/u    [name] has a history of the following: Patient Active Problem List   Diagnosis Date Noted  . History of colonic polyps   . Diverticulosis of colon without hemorrhage   . Asthma, mild intermittent 08/30/2015  . Encounter for screening colonoscopy 08/25/2015  . Wheezing 08/08/2015  . GERD (gastroesophageal reflux disease) 08/08/2015  . Morbid obesity ([place]) 07/14/2013  . Status post right knee replacement 06/17/2013  . Status post left knee replacement 06/17/2013  . DDD (degenerative disc disease), lumbosacral 10/06/2012  . Hip pain 10/06/2012  . Radicular  leg pain 10/06/2012  . Bursitis of hip, right 06/25/2012  . Trochanteric bursitis of both hips 09/11/2011  . S/P total knee replacement 07/03/2011  . Knee pain 03/20/2011  . OA (osteoarthritis) of knee 12/12/2010  . Mononeuritis of leg 10/24/2010  . Chronic pain 10/24/2010  . OTHER POSTSURGICAL STATUS OTHER 07/24/2010  . STRESS FRACTURE, TIBIA 04/11/2010  . TENOSYNOVITIS OF FOOT AND ANKLE 02/15/2010  . LOOSE BODY-KNEE 01/10/2010  . DERANGEMENT MENISCUS 01/01/2010  . [place] HEAD 01/01/2010  . SHOULDER PAIN 04/19/2009  . IMPINGEMENT SYNDROME 04/19/2009  . HERNIATED LUMBOSACRAL DISC 09/01/2007  . UNSPECIFIED NEURALGIA NEURITIS AND RADICULITIS 07/09/2007  . TOTAL KNEE FOLLOW-UP 06/25/2007  . OSTEOARTHROSIS, LOCAL, PRIMARY, LOWER LEG 03/24/2007  . Osteoarthrosis, unspecified whether generalized or localized, lower leg 03/09/2007  . KNEE PAIN 02/02/2007  . TEAR MEDIAL MENISCUS 02/02/2007    History obtained from: chart review and patient.  [name] was referred by Mickie Hillier, MD.     Genetta is a 61 y.o. female presenting for a follow up visit.   She was last seen in November 2017. At that time, he diagnosed her with bronchitis and treated her with clarithromycin for 2 weeks. We also gave her prednisone she is at her symptoms worsen. Her lung function looked good that day and we continued her on Advair 230/21 two puffs twice daily. We recommended using a spacer. We also discussed starting a monthly injectable medication. For her allergic rhinitis, we continued her on Rhinocort as well as Allegra. She has a history of lamb and pork  for allergy and gets urticaria when exposed to dust  Since the last visit, she has mostly done well. She was treated for the bronchitis and then continued on the same antibiotic for another course. She feels that she is doing slightly better. However despite her controller medication, she does have throat tightness and chest tightness that  seems to worsen at the end of the day every day. This worsening control has been going on since mid January. Albuterol does help with the chest tightness and hoarseness. She does have a history of reflux and is on Protonix for this. She denies postnasal drip of any type. She has never been on Spiriva. ACT today is 12. She has not needed any prednisone since the last visit.   Ms. Hollingsworth does have a history of allergic rhinitis with multiple sensitizations. However despite the sensitizations, her symptoms are well controlled with Rhinocort and Allegra. She is not particularly interested in allergy shots. She continues to avoid mammalian meats and has had no breakthrough urticaria.   Otherwise, there have been no changes to her past medical history, surgical history, family history, or social history.    Review of Systems: a 14-point review of systems is pertinent for what is mentioned in HPI.  Otherwise, all other systems were negative. Constitutional: negative other than that listed in the HPI Eyes: negative other than that listed in the HPI Ears, nose, mouth, throat, and face: negative other than that listed in the HPI Respiratory: negative other than that listed in the HPI Cardiovascular: negative other than that listed in the HPI Gastrointestinal: negative other than that listed in the HPI Genitourinary: negative other than that listed in the HPI Integument: negative other than that listed in the HPI Hematologic: negative other than that listed in the HPI Musculoskeletal: negative other than that listed in the HPI Neurological: negative other than that listed in the HPI Allergy/Immunologic: negative other than that listed in the HPI    Objective:   Blood pressure 128/80, pulse 91, temperature 98.3 F (36.8 C), temperature source Oral, resp. rate 16, height 5' (1.524 m), weight 269 lb (122 kg), SpO2 97 %. Body mass index is 52.54 kg/m.   Physical Exam:  General: Alert,  interactive, in no acute distress, cooperative with the exam, appears comfortable today. Eyes: No conjunctival injection present on the right, No conjunctival injection present on the left, PERRL bilaterally, No discharge on the right, No discharge on the left and No Horner-Trantas dots present Ears: Right TM pearly gray with normal light reflex, Left TM pearly gray with normal light reflex, Right TM intact without perforation and Left TM intact without perforation.  Nose/Throat: External nose within normal limits and septum midline, turbinates edematous and pale with clear discharge, post-pharynx erythematous with cobblestoning in the posterior oropharynx. Tonsils 2+ without exudates Neck: Supple without thyromegaly. Lungs: Decreased breath sounds bilaterally without wheezing, rhonchi or rales. No increased work of breathing. CV: Normal S1/S2, no murmurs. Capillary refill <2 seconds.  Skin: Warm and dry, without lesions or rashes. Neuro:   Grossly intact. No focal deficits appreciated. Responsive to questions.   Diagnostic studies:  Spirometry: results normal (FEV1: 1.86/90%, FVC: 2.15/85%, FEV1/FVC: 86%).    Spirometry consistent with normal pattern.   Allergy Studies: None     Salvatore Marvel, MD [facility]

## 2016-07-11 NOTE — Patient Instructions (Addendum)
1. 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 - EpiPen up to date.  2. Chronic allergic rhinitis - Continue with Rhinocort two sprays per nostril daily. - Continue with Allegra daily.  3. Moderate persistent asthma, uncomplicated - Continue with Advair 230/21 two puffs in the morning and two puffs at night with a spacer. - We will Xolair monthly to help with asthma control. - Tammy should be contacting you for more information in the next couple of days.   3. Return in about 3 months (around 10/08/2016).  Please inform us of any Emergency Department visits, hospitalizations, or changes in symptoms. Call us before going to the ED for breathing or allergy symptoms since we might be able to fit you in for a sick visit. Feel free to contact us anytime with any questions, problems, or concerns.  It was a pleasure to see you again today!   Websites that have reliable patient information: 1. American Academy of Asthma, Allergy, and Immunology: www.aaaai.org 2. Food Allergy Research and Education (FARE): foodallergy.org 3. Mothers of Asthmatics: http://www.asthmacommunitynetwork.org 4. American College of Allergy, Asthma, and Immunology: www.acaai.org

## 2016-07-12 ENCOUNTER — Other Ambulatory Visit: Payer: Self-pay | Admitting: Nurse Practitioner

## 2016-07-12 NOTE — Progress Notes (Signed)
Dr Darnell Level Already got her started. Sharon [name]

## 2016-07-15 ENCOUNTER — Ambulatory Visit: Payer: PPO | Admitting: Orthopedic Surgery

## 2016-07-17 ENCOUNTER — Telehealth: Payer: Self-pay | Admitting: Nurse Practitioner

## 2016-07-17 NOTE — Telephone Encounter (Signed)
Received a fax from [place] stating that patient's Xolair 150 MG vial was approved from 07/12/16-05/26/2017.

## 2016-07-23 ENCOUNTER — Telehealth: Payer: Self-pay | Admitting: Allergy & Immunology

## 2016-07-23 NOTE — Telephone Encounter (Signed)
Patient had an appt on 08-08-16 for Xolair. She called to cancel the injection because she said it was too expensive. She wanted to let us know before they were ordered. Nira Conn told me to send you a message because she thinks they are already here.

## 2016-07-26 ENCOUNTER — Other Ambulatory Visit: Payer: Self-pay | Admitting: Nurse Practitioner

## 2016-07-26 NOTE — Telephone Encounter (Signed)
Noted. I hope she considers giving it another couple of months. Can she contact the assistance programs to get an idea of the cost with the copay assistance?   Salvatore Marvel, MD [facility]

## 2016-07-26 NOTE — Telephone Encounter (Signed)
Patient advised she had contacted her insurance and stated she could not afford this therapy. I explained to patient that there assistance programs available that may help with cost of drug. She advised that she really didn't know much about drug and really could not tell a difference after her first injection.  I did tell her that most people cannot tell any change till after the third injection.  I told patient to think about it and she can contact me if she wants to try and moved forward.

## 2016-08-05 ENCOUNTER — Other Ambulatory Visit: Payer: Self-pay | Admitting: Family Medicine

## 2016-08-05 DIAGNOSIS — Z1231 Encounter for screening mammogram for malignant neoplasm of breast: Secondary | ICD-10-CM

## 2016-08-08 ENCOUNTER — Ambulatory Visit: Payer: Self-pay

## 2016-08-12 ENCOUNTER — Encounter: Payer: Self-pay | Admitting: Orthopedic Surgery

## 2016-08-12 ENCOUNTER — Ambulatory Visit (INDEPENDENT_AMBULATORY_CARE_PROVIDER_SITE_OTHER): Payer: PPO | Admitting: Orthopedic Surgery

## 2016-08-12 ENCOUNTER — Ambulatory Visit (INDEPENDENT_AMBULATORY_CARE_PROVIDER_SITE_OTHER): Payer: PPO

## 2016-08-12 VITALS — BP 146/80 | HR 99 | Ht 61.0 in | Wt 269.0 lb

## 2016-08-12 DIAGNOSIS — M1710 Unilateral primary osteoarthritis, unspecified knee: Secondary | ICD-10-CM

## 2016-08-12 NOTE — Progress Notes (Signed)
Patient ID: Sharon [name], female   DOB: January 14, 1956, 61 y.o.   MRN: [id number]  Post op annual TKA   Chief Complaint  Patient presents with  . Follow-up    [place], RT TKA 2013, LT TKA 2012    HPI Sharon [name] is a 61 y.o. female.  Status post bilateral total knees the left one had a revision early secondary to tendon rupture. She's doing well with both knees occasional discomfort on the left which is handled with ice   Past Medical History:  Diagnosis Date  . Anxiety   . Arthritis   . Carpal tunnel syndrome    bilateral  . Degenerative disk disease    mainly back  . Depression   . Fibrocystic breast disease 1996  . GERD (gastroesophageal reflux disease)   . Melanoma ([place])   . Migraine   . Tinnitus     Past Surgical History:  Procedure Laterality Date  . ABDOMINAL HYSTERECTOMY    . APPENDECTOMY    . BREAST BIOPSY     right  . COLONOSCOPY  07/2005   RMR: normal rectum, left-sided diverticular disease, otehrwise colon mucosa normal.  . COLONOSCOPY N/A 09/14/2015   Procedure: COLONOSCOPY;  Surgeon: Daneil Dolin, MD;  Location: [facility];  Service: Endoscopy;  Laterality: N/A;  930 - moved to 9:15 - office to notify  . menisus repair on rt knee     bilateral  . TONSILLECTOMY    . TOTAL KNEE ARTHROPLASTY  2008   left   . TOTAL KNEE ARTHROPLASTY  06/17/2011   Procedure: TOTAL KNEE ARTHROPLASTY;  Surgeon: Arther Abbott, MD;  Location: [facility] ORS;  Service: Orthopedics;  Laterality: Right;  [place]  . TOTAL KNEE REVISION  2.12.2012   left  . TUBAL LIGATION    . VESICOVAGINAL FISTULA CLOSURE W/ TAH       Allergies  Allergen Reactions  . Adhesive [Tape] Hives and Dermatitis    Can tolerate tegaderm  . Augmentin [Amoxicillin-Pot Clavulanate] Diarrhea and Nausea Only  . Latex Hives    [place]  . Neomycin-Bacitracin Zn-Polymyx Dermatitis    Current Outpatient Prescriptions  Medication Sig Dispense Refill  . albuterol (PROVENTIL HFA;VENTOLIN HFA) 108 (90  Base) MCG/ACT inhaler Inhale into the lungs every 6 (six) hours as needed for wheezing or shortness of breath.    Marland Kitchen albuterol (PROVENTIL) (2.5 MG/3ML) 0.083% nebulizer solution Use one vial via neb q 4 hours prn wheezing 25 vial 2  . ALPRAZolam (XANAX) 0.5 MG tablet TAKE ONE TABLET BY MOUTH TWICE DAILY AS NEEDED --  **MAY  FILL  MONTHLY** 30 tablet 2  . Budesonide (RHINOCORT ALLERGY NA) Place 2 sprays into the nose daily.     . Calcium Carbonate-Vitamin D (CALCIUM + D) 600-200 MG-UNIT TABS Take 1 tablet by mouth 2 (two) times daily.     Marland Kitchen EPINEPHrine 0.3 mg/0.3 mL IJ SOAJ injection     . fexofenadine (ALLEGRA ALLERGY) 180 MG tablet Take 180 mg by mouth daily.    . fish oil-omega-3 fatty acids 1000 MG capsule Take 2 g by mouth at bedtime.     . fluticasone-salmeterol (ADVAIR HFA) 230-21 MCG/ACT inhaler Inhale 2 puffs into the lungs 2 (two) times daily. Use with spacer. 1 Inhaler 5  . furosemide (LASIX) 40 MG tablet Take 1.5 tablets by mouth each morning. (Patient taking differently: Take 40 mg by mouth daily. Take 1.5 tablets by mouth each morning.) 45 tablet 3  . HYDROcodone-acetaminophen (NORCO) 10-325 MG  tablet Take 1 tablet by mouth every 6 (six) hours as needed. 100 tablet 0  . KLOR-CON M10 10 MEQ tablet TAKE ONE TABLET BY MOUTH TWICE DAILY 180 tablet 1  . methocarbamol (ROBAXIN) 500 MG tablet TAKE ONE TABLET BY MOUTH EVERY 6 HOURS AS NEEDED FOR  BACK  PAIN 60 tablet 2  . Multiple Vitamin (MULITIVITAMIN WITH MINERALS) TABS Take 1 tablet by mouth at bedtime.    . nabumetone (RELAFEN) 750 MG tablet TAKE ONE TABLET BY MOUTH TWICE DAILY AS NEEDED FOR PAIN 60 tablet 0  . pantoprazole (PROTONIX) 40 MG tablet TAKE ONE TABLET BY MOUTH ONCE DAILY FOR REFLUX 30 tablet 5  . phentermine (ADIPEX-P) 37.5 MG tablet TAKE ONE TABLET BY MOUTH ONCE DAILY BEFORE  BREAKFAST 30 tablet 2   Current Facility-Administered Medications  Medication Dose Route Frequency Provider Last Rate Last Dose  . omalizumab Arvid Right)  injection 300 mg  300 mg Subcutaneous Q28 days Valentina Shaggy, MD   300 mg at 07/11/16 1359    Review of Systems Review of Systems  Back pain is well controlled with Relafen Neurologically no sense symptoms of numbness or tingling  Past Medical History:  Diagnosis Date  . Anxiety   . Arthritis   . Carpal tunnel syndrome    bilateral  . Degenerative disk disease    mainly back  . Depression   . Fibrocystic breast disease 1996  . GERD (gastroesophageal reflux disease)   . Melanoma ([place])   . Migraine   . Tinnitus      Physical Exam Blood pressure (!) 146/80, pulse 99, height 5\' 1"  (1.549 m), weight 269 lb (122 kg).   Gen. appearance is normal there are no congenital abnormalities   The patient is oriented 3   Mood and affect are normal   Ambulation is without assistive device    Data Reviewed KNEE XRAYS : Both x-rays are taken today 3 views of each. On the right side we have a well fixed right total knee with no signs of loosening or polyethylene wear no ostial lysis is seen  This is a [place] fixed-bearing posterior stabilized knee  On the left side is a [employer] total knee. The patella is not resurfaced on the left  Please see my official radiology report  Assessment S/P BILATERAL TKA   Stable  Plan    Bilateral x-rays in 1 year       Arther Abbott 08/12/2016, 9:59 AM

## 2016-08-14 ENCOUNTER — Ambulatory Visit: Payer: PPO | Admitting: Nurse Practitioner

## 2016-08-22 ENCOUNTER — Ambulatory Visit
Admission: RE | Admit: 2016-08-22 | Discharge: 2016-08-22 | Disposition: A | Discharge status: Home / Self Care | Payer: PPO | Source: Ambulatory Visit | Attending: Family Medicine | Admitting: Family Medicine

## 2016-08-22 DIAGNOSIS — Z1231 Encounter for screening mammogram for malignant neoplasm of breast: Secondary | ICD-10-CM | POA: Diagnosis not present

## 2016-08-26 ENCOUNTER — Other Ambulatory Visit: Payer: Self-pay

## 2016-08-26 ENCOUNTER — Telehealth: Payer: Self-pay | Admitting: Family Medicine

## 2016-08-26 ENCOUNTER — Encounter: Payer: Self-pay | Admitting: Nurse Practitioner

## 2016-08-26 ENCOUNTER — Ambulatory Visit (INDEPENDENT_AMBULATORY_CARE_PROVIDER_SITE_OTHER): Payer: PPO | Admitting: Nurse Practitioner

## 2016-08-26 VITALS — BP 138/80 | Ht 60.0 in | Wt 264.2 lb

## 2016-08-26 DIAGNOSIS — R1011 Right upper quadrant pain: Secondary | ICD-10-CM | POA: Diagnosis not present

## 2016-08-26 DIAGNOSIS — R110 Nausea: Secondary | ICD-10-CM | POA: Diagnosis not present

## 2016-08-26 MED ORDER — EPINEPHRINE 0.3 MG/0.3ML IJ SOAJ: 0.3000 mg | Freq: Once | INTRAMUSCULAR | 0 refills | Status: AC

## 2016-08-26 MED ORDER — HYDROCODONE-ACETAMINOPHEN 10-325 MG PO TABS: 1.0000 | ORAL_TABLET | Freq: Four times a day (QID) | ORAL | 0 refills | Status: DC | PRN

## 2016-08-26 MED ORDER — KETOROLAC TROMETHAMINE 30 MG/ML IM SOLN: 30.0000 mg | Freq: Once | INTRAMUSCULAR | 0 refills | Status: AC

## 2016-08-26 MED ORDER — ONDANSETRON 8 MG PO TBDP: 8.0000 mg | ORAL_TABLET | Freq: Three times a day (TID) | ORAL | 0 refills | Status: DC | PRN

## 2016-08-26 NOTE — Addendum Note (Signed)
Addended by: Launa Grill on: 08/26/2016 04:49 PM   Modules accepted: Orders

## 2016-08-26 NOTE — Telephone Encounter (Signed)
sure

## 2016-08-26 NOTE — Progress Notes (Signed)
Subjective:  61 yo Sharon [name] presents today for weight loss follow up.  Pt. Has been taking Phentermine daily with good compliance and tolerance.  Has weight loss of 7lbs.  Would like to continue taking medications and is making changes in her diet such as portion control and cutting carbs.  Denies CP or palpitations.  Pt. Explains she is unable to complete wt. Loss surgery r/t financial strain and medication related.    Also mentioned c/o of chronic RUQ pain for several months.  Pain occurs approximately 2-3 times per week, lasts for several hours, and resolves on its own.  Pain occurs after eating, but not associated with any type of food.  Has intermittent nausea, no emesis.  No constipation or diarrhea.  Pain does not radiate to back or shoulder.  Denies any urinary symptoms.    Noticing increase in migraine frequency as well.  Occurring about once a month, can be severe, and causes nausea.  Takes Vicodin for pain with limited efficacy.  No change in symptoms associated with migraine.   Objective:   BP 138/80   Ht 5' (1.524 m)   Wt 264 lb 4 oz (119.9 kg)   BMI 51.61 kg/m  Alert and oriented, NAD.  Lungs: CTA Cardiac: RRR, no murmurs or gallops Abd: moderately tender to palpation of RUQ, BS+, soft, no obvious masses palpable, limited exam due to abdominal girth   Assessment:  Right upper quadrant abdominal pain - Plan: Lipase, Hepatic function panel, Basic metabolic panel, CBC with Differential/Platelet, US Abdomen Limited RUQ  Morbid obesity ([facility])  Nausea - Plan: Lipase, Hepatic function panel, Basic metabolic panel, CBC with Differential/Platelet, US Abdomen Limited RUQ   Plan:   Meds ordered this encounter  Medications  . HYDROcodone-acetaminophen (NORCO) 10-325 MG tablet    Sig: Take 1 tablet by mouth every 6 (six) hours as needed.    Dispense:  100 tablet    Refill:  0    Order Specific Question:   Supervising Provider    Answer:   [name] [[id number]]  . ondansetron  (ZOFRAN-ODT) 8 MG disintegrating tablet    Sig: Take 1 tablet (8 mg total) by mouth every 8 (eight) hours as needed for nausea or vomiting.    Dispense:  30 tablet    Refill:  0    Order Specific Question:   Supervising Provider    Answer:   [name] [[id number]]  . ketorolac (TORADOL) 30 MG/ML injection    Sig: Inject 1 mL (30 mg total) into the muscle once. Prn migraine    Dispense:  1 mL    Refill:  0    Order Specific Question:   Supervising Provider    Answer:   [name] [[id number]]   Abdominal pain- Plan to complete RUQ abdominal U/S.  Discussed with patient.  May potentially need a HIDA scan if U/S negative and symptoms persist.  Labs pending.   Migraines-  Refill RX for prn Vicodin.  Give one time dose of IM toradol for severe pain uncontrolled by po medications.  Given 30mg  dose, but will increase to 60 if needed.  Pt. Agrees with plan and is comfortable administering a shot.  Zofran prn for nausea given.  Notify office if symptoms worsen or fail to improve.    NCCSR reviewed  Phentermine RX was refilled beginning of March for another 3 months, then will stop the medication. Discussed importance of continuing increasing activity, 30 min 3Xweek if can tolerate and continuing  with diet improvements.    Return in about 3 months (around 11/25/2016) for physical.

## 2016-08-26 NOTE — Telephone Encounter (Signed)
Received fax requesting prescription for Epi pen. May we refill?

## 2016-08-27 ENCOUNTER — Encounter: Payer: Self-pay | Admitting: Nurse Practitioner

## 2016-08-27 LAB — BASIC METABOLIC PANEL
BUN/Creatinine Ratio: 15 (ref 12–28)
BUN: 12 mg/dL (ref 8–27)
CO2: 26 mmol/L (ref 18–29)
CREATININE: 0.79 mg/dL (ref 0.57–1.00)
Calcium: 9.3 mg/dL (ref 8.7–10.3)
Chloride: 100 mmol/L (ref 96–106)
GFR calc Af Amer: 93 mL/min/{1.73_m2} (ref >59)
GFR calc non Af Amer: 80 mL/min/{1.73_m2} (ref >59)
Glucose: 97 mg/dL (ref 65–99)
POTASSIUM: 4.1 mmol/L (ref 3.5–5.2)
SODIUM: 141 mmol/L (ref 134–144)

## 2016-08-27 LAB — CBC WITH DIFFERENTIAL/PLATELET
Basophils Absolute: 0 10*3/uL (ref 0.0–0.2)
Basos: 1 %
EOS (ABSOLUTE): 0.2 10*3/uL (ref 0.0–0.4)
EOS: 2 %
Hematocrit: 41.5 % (ref 34.0–46.6)
Hemoglobin: 14 g/dL (ref 11.1–15.9)
Immature Grans (Abs): 0 10*3/uL (ref 0.0–0.1)
Immature Granulocytes: 0 %
Lymphocytes Absolute: 2 10*3/uL (ref 0.7–3.1)
Lymphs: 31 %
MCH: 31.7 pg (ref 26.6–33.0)
MCHC: 33.7 g/dL (ref 31.5–35.7)
MCV: 94 fL (ref 79–97)
Monocytes Absolute: 0.8 10*3/uL (ref 0.1–0.9)
Monocytes: 13 %
Neutrophils Absolute: 3.5 10*3/uL (ref 1.4–7.0)
Neutrophils: 53 %
PLATELETS: 281 10*3/uL (ref 150–379)
RBC: 4.41 x10E6/uL (ref 3.77–5.28)
RDW: 14.1 % (ref 12.3–15.4)
WBC: 6.6 10*3/uL (ref 3.4–10.8)

## 2016-08-27 LAB — HEPATIC FUNCTION PANEL
ALBUMIN: 4.1 g/dL (ref 3.6–4.8)
ALT: 22 IU/L (ref 0–32)
AST: 20 IU/L (ref 0–40)
Alkaline Phosphatase: 84 IU/L (ref 39–117)
BILIRUBIN TOTAL: 0.4 mg/dL (ref 0.0–1.2)
BILIRUBIN, DIRECT: 0.14 mg/dL (ref 0.00–0.40)
Total Protein: 7.1 g/dL (ref 6.0–8.5)

## 2016-08-27 LAB — LIPASE: Lipase: 35 U/L (ref 14–72)

## 2016-08-29 ENCOUNTER — Encounter: Payer: Self-pay | Admitting: Nurse Practitioner

## 2016-08-29 ENCOUNTER — Ambulatory Visit (HOSPITAL_COMMUNITY)
Admission: RE | Admit: 2016-08-29 | Discharge: 2016-08-29 | Disposition: A | Discharge status: Home / Self Care | Payer: PPO | Source: Ambulatory Visit | Attending: Nurse Practitioner | Admitting: Nurse Practitioner

## 2016-08-29 DIAGNOSIS — R1011 Right upper quadrant pain: Secondary | ICD-10-CM | POA: Insufficient documentation

## 2016-08-29 DIAGNOSIS — R110 Nausea: Secondary | ICD-10-CM | POA: Insufficient documentation

## 2016-08-29 DIAGNOSIS — K760 Fatty (change of) liver, not elsewhere classified: Secondary | ICD-10-CM | POA: Diagnosis not present

## 2016-08-30 ENCOUNTER — Telehealth: Payer: Self-pay | Admitting: Nurse Practitioner

## 2016-08-30 DIAGNOSIS — R1011 Right upper quadrant pain: Secondary | ICD-10-CM

## 2016-08-30 NOTE — Telephone Encounter (Signed)
Ordered HIDA scan for patient per Linzie Collin. Originally got test ordered for 09/09/16 due to no available appointments next week. Informed patient and patient stated that she must have a Thursday or Friday. Rescheduled for 09/13/16. Patient then informed me that she has a vacation planned and can't go that week either. Gave patient number to nuclear medicine at [facility] and patient stated she will call and reschedule.

## 2016-08-30 NOTE — Telephone Encounter (Signed)
Please order HIDA scan hopefully next week. Diagnosis: RUQ abd pain (see last progress note). Thanks.

## 2016-08-30 NOTE — Telephone Encounter (Signed)
noted 

## 2016-09-03 DIAGNOSIS — D485 Neoplasm of uncertain behavior of skin: Secondary | ICD-10-CM | POA: Diagnosis not present

## 2016-09-03 DIAGNOSIS — D2261 Melanocytic nevi of right upper limb, including shoulder: Secondary | ICD-10-CM | POA: Diagnosis not present

## 2016-09-03 DIAGNOSIS — D225 Melanocytic nevi of trunk: Secondary | ICD-10-CM | POA: Diagnosis not present

## 2016-09-03 DIAGNOSIS — Z1283 Encounter for screening for malignant neoplasm of skin: Secondary | ICD-10-CM | POA: Diagnosis not present

## 2016-09-03 DIAGNOSIS — Z85820 Personal history of malignant melanoma of skin: Secondary | ICD-10-CM | POA: Diagnosis not present

## 2016-09-03 DIAGNOSIS — Z08 Encounter for follow-up examination after completed treatment for malignant neoplasm: Secondary | ICD-10-CM | POA: Diagnosis not present

## 2016-09-03 DIAGNOSIS — L820 Inflamed seborrheic keratosis: Secondary | ICD-10-CM | POA: Diagnosis not present

## 2016-09-05 ENCOUNTER — Encounter: Payer: Self-pay | Admitting: Nurse Practitioner

## 2016-09-06 ENCOUNTER — Encounter (HOSPITAL_COMMUNITY): Payer: PPO

## 2016-09-09 ENCOUNTER — Encounter (HOSPITAL_COMMUNITY): Payer: PPO

## 2016-09-11 ENCOUNTER — Telehealth: Payer: Self-pay | Admitting: Family Medicine

## 2016-09-11 NOTE — Telephone Encounter (Signed)
Received Approval for patient's HIDA scan through [facility].  HIDA Scan (CPT [id number]) is approved from 08/30/2016- 11/28/2016. Authorization number is [id number]

## 2016-09-12 ENCOUNTER — Other Ambulatory Visit: Payer: Self-pay | Admitting: Nurse Practitioner

## 2016-09-12 ENCOUNTER — Encounter (HOSPITAL_COMMUNITY): Payer: Self-pay

## 2016-09-12 ENCOUNTER — Encounter: Payer: Self-pay | Admitting: Nurse Practitioner

## 2016-09-12 ENCOUNTER — Encounter (HOSPITAL_COMMUNITY)
Admission: RE | Admit: 2016-09-12 | Discharge: 2016-09-12 | Disposition: A | Discharge status: Home / Self Care | Payer: PPO | Source: Ambulatory Visit | Attending: Nurse Practitioner | Admitting: Nurse Practitioner

## 2016-09-12 DIAGNOSIS — R1011 Right upper quadrant pain: Secondary | ICD-10-CM | POA: Diagnosis not present

## 2016-09-12 MED ORDER — TECHNETIUM TC 99M MEBROFENIN IV KIT
5.0000 | PACK | Freq: Once | INTRAVENOUS | Status: AC | PRN
  Administered 2016-09-12: 5 via INTRAVENOUS

## 2016-09-13 ENCOUNTER — Other Ambulatory Visit (HOSPITAL_COMMUNITY): Payer: PPO

## 2016-09-15 ENCOUNTER — Encounter: Payer: Self-pay | Admitting: Nurse Practitioner

## 2016-09-16 ENCOUNTER — Other Ambulatory Visit: Payer: Self-pay | Admitting: Nurse Practitioner

## 2016-09-16 ENCOUNTER — Encounter: Payer: Self-pay | Admitting: Family Medicine

## 2016-09-16 DIAGNOSIS — R1011 Right upper quadrant pain: Secondary | ICD-10-CM

## 2016-09-16 DIAGNOSIS — R110 Nausea: Secondary | ICD-10-CM

## 2016-09-17 ENCOUNTER — Encounter: Payer: Self-pay | Admitting: Nurse Practitioner

## 2016-09-17 ENCOUNTER — Encounter: Payer: Self-pay | Admitting: Gastroenterology

## 2016-10-10 ENCOUNTER — Ambulatory Visit (INDEPENDENT_AMBULATORY_CARE_PROVIDER_SITE_OTHER): Payer: PPO | Admitting: Allergy & Immunology

## 2016-10-10 ENCOUNTER — Other Ambulatory Visit: Payer: Self-pay | Admitting: *Deleted

## 2016-10-10 ENCOUNTER — Encounter: Payer: Self-pay | Admitting: Allergy & Immunology

## 2016-10-10 VITALS — BP 132/86 | HR 96 | Resp 16

## 2016-10-10 DIAGNOSIS — J4540 Moderate persistent asthma, uncomplicated: Secondary | ICD-10-CM | POA: Diagnosis not present

## 2016-10-10 DIAGNOSIS — J3089 Other allergic rhinitis: Secondary | ICD-10-CM

## 2016-10-10 DIAGNOSIS — L508 Other urticaria: Secondary | ICD-10-CM

## 2016-10-10 MED ORDER — TIOTROPIUM BROMIDE MONOHYDRATE 1.25 MCG/ACT IN AERS: 2.0000 | INHALATION_SPRAY | Freq: Every day | RESPIRATORY_TRACT | 3 refills | Status: DC

## 2016-10-10 MED ORDER — FLUTICASONE FUROATE-VILANTEROL 200-25 MCG/INH IN AEPB: 1.0000 | INHALATION_SPRAY | Freq: Every day | RESPIRATORY_TRACT | 3 refills | Status: DC

## 2016-10-10 NOTE — Patient Instructions (Addendum)
1. 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 - EpiPen up to date.  2. Chronic allergic rhinitis - Continue with Rhinocort two sprays per nostril daily. - Continue with Allegra daily.  3. Moderate persistent asthma, uncomplicated - Lung function is slightly worse than the last visit, but you did respond to a a nebulizer treatment.  - We will hold off on Xolair since the copay is so expensive. - We will change you to Breo 200/25 one inhalation once daily. - We gave you a sample today, therefore let us know if this helps you.  - We will add Spiriva two inhalations once daily.  - Daily controller medication(s): Breo 200/25 one inhalation once daily + Spiriva two inhalations once daily - Rescue medications: ProAir 4 puffs every 4-6 hours as needed or albuterol nebulizer one vial puffs every 4-6 hours as needed - Asthma control goals:  * Full participation in all desired activities (may need albuterol before activity) * Albuterol use two time or less a week on average (not counting use with activity) * Cough interfering with sleep two time or less a month * Oral steroids no more than once a year * No hospitalizations  3. Return in about 4 weeks (around 11/07/2016).   Please inform us of any Emergency Department visits, hospitalizations, or changes in symptoms. Call us before going to the ED for breathing or allergy symptoms since we might be able to fit you in for a sick visit. Feel free to contact us anytime with any questions, problems, or concerns.  It was a pleasure to see you again today! Happy spring!   Websites that have reliable patient information: 1. American Academy of Asthma, Allergy, and Immunology: www.aaaai.org 2. Food Allergy Research and Education (FARE): foodallergy.org 3. Mothers of Asthmatics: http://www.asthmacommunitynetwork.org 4. American College of Allergy, Asthma, and Immunology: www.acaai.org

## 2016-10-10 NOTE — Progress Notes (Signed)
FOLLOW UP  Date of Service/Encounter:  10/10/16   Assessment:   Moderate persistent asthma, uncomplicated  Chronic nonseasonal allergic rhinitis due to fungal spores  Chronic urticaria    Asthma Reportables:  Severity: moderate persistent  Risk: high Control: not well controlled  Plan/Recommendations:   1. 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 - EpiPen up to date.  2. Chronic allergic rhinitis (cat, dog, trees, weeds, molds, grass, dust mite) - Continue with Rhinocort two sprays per nostril daily. - Continue with Allegra daily.  3. Moderate persistent asthma, uncomplicated - Lung function is slightly worse than the last visit, but you did respond to a a nebulizer treatment.  - We will hold off on Xolair since the copay is so expensive. - We will change you to Breo 200/25 one inhalation once daily. - We gave you a sample today, therefore let us know if this helps you.  - We will add Spiriva two inhalations once daily.  - Daily controller medication(s): Breo 200/25 one inhalation once daily + Spiriva two inhalations once daily - Rescue medications: ProAir 4 puffs every 4-6 hours as needed or albuterol nebulizer one vial puffs every 4-6 hours as needed - Asthma control goals:  * Full participation in all desired activities (may need albuterol before activity) * Albuterol use two time or less a week on average (not counting use with activity) * Cough interfering with sleep two time or less a month * Oral steroids no more than once a year * No hospitalizations - Of note, she did have an absolute eosinophil count of 200 in April 2018, therefore, she would qualify for an anti-IL5 agent.  3. Return in about 4 weeks (around 11/07/2016).    Subjective:   Sharon [name] is a 61 y.o. female presenting today for follow up of  Chief Complaint  Patient presents with  . Asthma    doing the same    Sharon [name] has a  history of the following: Patient Active Problem List   Diagnosis Date Noted  . History of colonic polyps   . Diverticulosis of colon without hemorrhage   . Asthma, mild intermittent 08/30/2015  . Encounter for screening colonoscopy 08/25/2015  . Wheezing 08/08/2015  . GERD (gastroesophageal reflux disease) 08/08/2015  . Morbid obesity ([place]) 07/14/2013  . Status post right knee replacement 06/17/2013  . Status post left knee replacement 06/17/2013  . DDD (degenerative disc disease), lumbosacral 10/06/2012  . Hip pain 10/06/2012  . Radicular leg pain 10/06/2012  . Bursitis of hip, right 06/25/2012  . Trochanteric bursitis of both hips 09/11/2011  . S/P total knee replacement 07/03/2011  . Knee pain 03/20/2011  . OA (osteoarthritis) of knee 12/12/2010  . Mononeuritis of leg 10/24/2010  . Chronic pain 10/24/2010  . OTHER POSTSURGICAL STATUS OTHER 07/24/2010  . STRESS FRACTURE, TIBIA 04/11/2010  . TENOSYNOVITIS OF FOOT AND ANKLE 02/15/2010  . LOOSE BODY-KNEE 01/10/2010  . DERANGEMENT MENISCUS 01/01/2010  . [place] HEAD 01/01/2010  . SHOULDER PAIN 04/19/2009  . IMPINGEMENT SYNDROME 04/19/2009  . HERNIATED LUMBOSACRAL DISC 09/01/2007  . UNSPECIFIED NEURALGIA NEURITIS AND RADICULITIS 07/09/2007  . TOTAL KNEE FOLLOW-UP 06/25/2007  . OSTEOARTHROSIS, LOCAL, PRIMARY, LOWER LEG 03/24/2007  . Osteoarthrosis, unspecified whether generalized or localized, lower leg 03/09/2007  . KNEE PAIN 02/02/2007  . TEAR MEDIAL MENISCUS 02/02/2007    History obtained from: chart review and patient.  Sharon [name] was referred by [name], MD.  [name] is a 61 y.o. female presenting for a follow up visit. She was last seen in February 2018. At that time, her urticaria was stable with also are stable work avoidance. Her allergic rhinitis was controlled with Rhinocort 2 sprays per nostril daily as well as. She has not been interested in allergy shots in the past. Her asthma was  not well controlled. We continued her on Advair 230/21 two puffs in the morning and two puffs at night. She continued to have breakthrough symptoms, therefore we decided to start her on Xolair. We did give her a sample at that visit. However, her insurance told her that her monthly payments would be somewhere in the ballpark of $750, therefore she decided not to go ahead with this. She was told by Sharon [name] that she needed to look into copay programs, but she tells me today that she has not done that because they will ask her about her finances and then determine that she will not qualify. Therefore she has not tried this yet.    Since last visit, she reports that she continues to have problems with shortness of breath on a daily basis. She is using her rescue inhaler at least once per day, which does improve her symptoms. She endorses compliance with the Advair and does use a spacer. She did not feel any improvement with the one sample of Xolair we gave her, and she was not willing to pay the $750 per month. Her symptoms particularly worse and every afternoon when she feels hoarse with chest constriction. She has had a normal EKG in the past. Her most recent chest x-ray was normal earlier this year, according the patient. Unfortunately, this is not in our system. She has been on Symbicort in the past without improvement. She has never been on Breo or Spiriva.  From an allergic rhinitis standpoint, she remains on her Rhinocort 2 sprays per nostril daily and Allegra. This does not provide complete relief, but she is able to make it through. She is still not interested in starting allergy shots. This is all relatively new to her, she never had any asthma or allergy problems prior to last spring.   Otherwise, there have been no changes to her past medical history, surgical history, family history, or social history.    Review of Systems: a 14-point review of systems is pertinent for what is mentioned in HPI.   Otherwise, all other systems were negative. Constitutional: negative other than that listed in the HPI Eyes: negative other than that listed in the HPI Ears, nose, mouth, throat, and face: negative other than that listed in the HPI Respiratory: negative other than that listed in the HPI Cardiovascular: negative other than that listed in the HPI Gastrointestinal: negative other than that listed in the HPI Genitourinary: negative other than that listed in the HPI Integument: negative other than that listed in the HPI Hematologic: negative other than that listed in the HPI Musculoskeletal: negative other than that listed in the HPI Neurological: negative other than that listed in the HPI Allergy/Immunologic: negative other than that listed in the HPI    Objective:   Blood pressure 132/86, pulse 96, resp. rate 16. There is no height or weight on file to calculate BMI.   Physical Exam:  General: Alert, interactive, in no acute distress. Obese female.  Eyes: No conjunctival injection present on the right, No conjunctival injection present on the left, PERRL bilaterally, No discharge on the right, No discharge on the left  and No Horner-Trantas dots present Ears: Right TM pearly gray with normal light reflex, Left OME, Right TM intact without perforation and Left TM intact without perforation.  Nose/Throat: External nose within normal limits, nasal crease present and septum midline, turbinates edematous with clear discharge, post-pharynx erythematous with cobblestoning in the posterior oropharynx. Tonsils 2+ without exudates Neck: Supple without thyromegaly. Lungs: Decreased breath sounds bilaterally without wheezing, rhonchi or rales. No increased work of breathing. CV: Normal S1/S2, no murmurs. Capillary refill <2 seconds.  Skin: Warm and dry, without lesions or rashes. Neuro:   Grossly intact. No focal deficits appreciated. Responsive to questions.   Diagnostic studies:  Spirometry:  results abnormal (FEV1: 1.63/80%, FVC: 2.88/115%, FEV1/FVC: 56%).    Spirometry consistent with moderate obstructive disease. Albuterol/Atrovent nebulizer treatment given in clinic with significant improvement. Following the nebulizer treatment, her forced vital capacity normalized and her FEV1 remained stable. Her FEF 25-75 percent increase 885%. Overall, her flow volume loops appear much more normal following the treatment with less evidence of airway trapping.  Allergy Studies: none   Salvatore Marvel, MD [facility]

## 2016-10-14 ENCOUNTER — Encounter: Payer: Self-pay | Admitting: *Deleted

## 2016-10-15 ENCOUNTER — Ambulatory Visit (INDEPENDENT_AMBULATORY_CARE_PROVIDER_SITE_OTHER): Payer: PPO | Admitting: Gastroenterology

## 2016-10-15 ENCOUNTER — Encounter: Payer: Self-pay | Admitting: Nurse Practitioner

## 2016-10-15 ENCOUNTER — Encounter: Payer: Self-pay | Admitting: Gastroenterology

## 2016-10-15 VITALS — BP 128/72 | HR 82 | Ht 60.0 in | Wt 266.6 lb

## 2016-10-15 DIAGNOSIS — R110 Nausea: Secondary | ICD-10-CM

## 2016-10-15 DIAGNOSIS — R1011 Right upper quadrant pain: Secondary | ICD-10-CM

## 2016-10-15 NOTE — Progress Notes (Signed)
[facility] Consult Note:  History: Sharon [name] 10/15/2016  Referring physician: [name], MD  Reason for consult/chief complaint: Abdominal Pain (RUQ pain. Pt reports it comes and goes.  Lots of pressure. since December) and Nausea (due to the pain)   Subjective  HPI:  This is a 61 year old woman referred by primary care noted above for right upper quadrant pain. For the last 5 or 6 months she has had intermittent sharp right upper quadrant pain that radiates around to the back with associated nausea. It is often precipitated by eating, but not necessarily. It might occur at any time of day and does not seem to be exertional. She has no chest or arm pain when this right upper quadrant pain occurs. She had no fall or other trauma to that area. The entire right upper quadrant is tender during pain episodes, which usually last about 30 minutes and follow a crescendo decrescendo pattern. She denies dysphagia, odynophagia, vomiting or weight loss. Her bowel habits are regular without rectal bleeding. She is up-to-date on colorectal cancer screening with her last colonoscopy in April 2017 in [place], a hyperplastic polyp was found.  Workup for her problems so far includes a right upper quadrant ultrasound and a CCK HIDA scan as noted below.  ROS:  Review of Systems  Constitutional: Negative for appetite change and unexpected weight change.  HENT: Negative for mouth sores and voice change.   Eyes: Negative for pain and redness.  Respiratory: Positive for shortness of breath. Negative for cough.   Cardiovascular: Negative for chest pain and palpitations.  Genitourinary: Negative for dysuria and hematuria.  Musculoskeletal: Positive for back pain. Negative for myalgias.  Skin: Negative for pallor and rash.  Allergic/Immunologic: Positive for environmental allergies.  Neurological: Negative for weakness and headaches.  Hematological: Negative for  adenopathy.     Past Medical History: Past Medical History:  Diagnosis Date  . Anxiety   . Arthritis   . Carpal tunnel syndrome    bilateral  . Colon polyps   . Degenerative disk disease    mainly back  . Depression   . Fatty liver   . Fibrocystic breast disease 1996  . GERD (gastroesophageal reflux disease)   . Melanoma ([place])   . Migraine   . Tinnitus      Past Surgical History: Past Surgical History:  Procedure Laterality Date  . ABDOMINAL HYSTERECTOMY    . APPENDECTOMY    . BREAST BIOPSY     right  . BREAST EXCISIONAL BIOPSY Right    two biopsies on the right  . COLONOSCOPY  07/2005   RMR: normal rectum, left-sided diverticular disease, otehrwise colon mucosa normal.  . COLONOSCOPY N/A 09/14/2015   Procedure: COLONOSCOPY;  Surgeon: Daneil Dolin, MD;  Location: [facility] SU[facility];  Service: Endoscopy;  Laterality: N/A;  930 - moved to 9:15 - office to notify  . menisus repair on rt knee     bilateral  . TONSILLECTOMY    . TOTAL KNEE ARTHROPLASTY  2008   left   . TOTAL KNEE ARTHROPLASTY  06/17/2011   Procedure: TOTAL KNEE ARTHROPLASTY;  Surgeon: Arther Abbott, MD;  Location: [facility]S;  Service: Orthopedics;  Laterality: Right;  [place]  . TOTAL KNEE REVISION  2.12.2012   left  . TUBAL LIGATION    . VESICOVAGINAL FISTULA CLOSURE W/ TAH       Family History: Family History  Problem Relation Age of Onset  . Hypertension Father   .  Lung cancer Father 56       lung cancer  . Lung disease Unknown        family history   . Allergic rhinitis Daughter   . Food Allergy Grandchild   . Anesthesia problems Neg Hx   . Hypotension Neg Hx   . Malignant hyperthermia Neg Hx   . Pseudochol deficiency Neg Hx   . Colon cancer Neg Hx   . Angioedema Neg Hx   . Asthma Neg Hx   . Eczema Neg Hx   . Immunodeficiency Neg Hx   . Urticaria Neg Hx     Social History: Social History   Social History  . Marital status: Single    Spouse name: N/A  . Number of children: N/A  .  Years of education: N/A   Occupational History  . RN at [employer]  . Smoking status: Former Smoker    Packs/day: 1.00    Years: 20.00    Types: Cigarettes    Start date: 08/02/1972    Quit date: 05/27/2006  . Smokeless tobacco: Never Used  . Alcohol use 0.0 oz/week     Comment: 1 glass occassionally on holidays (about once a month)  . Drug use: No  . Sexual activity: Yes    Birth control/ protection: Surgical   Other Topics Concern  . None   Social History Narrative  . None    Allergies: Allergies  Allergen Reactions  . Adhesive [Tape] Hives and Dermatitis    Can tolerate tegaderm  . Augmentin [Amoxicillin-Pot Clavulanate] Diarrhea and Nausea Only  . Latex Hives    [place]  . Neomycin-Bacitracin Zn-Polymyx Dermatitis    Outpatient Meds: Current Outpatient Prescriptions  Medication Sig Dispense Refill  . albuterol (PROVENTIL HFA;VENTOLIN HFA) 108 (90 Base) MCG/ACT inhaler Inhale into the lungs every 6 (six) hours as needed for wheezing or shortness of breath.    Marland Kitchen albuterol (PROVENTIL) (2.5 MG/3ML) 0.083% nebulizer solution Use one vial via neb q 4 hours prn wheezing 25 vial 2  . ALPRAZolam (XANAX) 0.5 MG tablet TAKE ONE TABLET BY MOUTH TWICE DAILY AS NEEDED (  MAY  FILL  MONTHLY) 30 tablet 2  . Budesonide (RHINOCORT ALLERGY NA) Place 2 sprays into the nose daily.     . Calcium Carbonate-Vitamin D (CALCIUM + D) 600-200 MG-UNIT TABS Take 1 tablet by mouth 2 (two) times daily.     Marland Kitchen EPINEPHrine 0.3 mg/0.3 mL IJ SOAJ injection     . fexofenadine (ALLEGRA ALLERGY) 180 MG tablet Take 180 mg by mouth daily.    . fish oil-omega-3 fatty acids 1000 MG capsule Take 2 g by mouth at bedtime.     . fluticasone furoate-vilanterol (BREO ELLIPTA) 200-25 MCG/INH AEPB Inhale 1 puff into the lungs daily. 28 each 3  . furosemide (LASIX) 40 MG tablet Take 1.5 tablets by mouth each morning. (Patient taking differently: Take 40 mg by mouth daily. Take 1.5 tablets by  mouth each morning.) 45 tablet 3  . HYDROcodone-acetaminophen (NORCO) 10-325 MG tablet Take 1 tablet by mouth every 6 (six) hours as needed. 100 tablet 0  . KLOR-CON M10 10 MEQ tablet TAKE ONE TABLET BY MOUTH TWICE DAILY 180 tablet 1  . methocarbamol (ROBAXIN) 500 MG tablet TAKE ONE TABLET BY MOUTH EVERY 6 HOURS AS NEEDED FOR  BACK  PAIN 60 tablet 2  . Multiple Vitamin (MULITIVITAMIN WITH MINERALS) TABS Take 1 tablet by mouth at bedtime.    Marland Kitchen  nabumetone (RELAFEN) 750 MG tablet TAKE 1 TABLET BY MOUTH TWICE DAILY AS NEEDED FOR PAIN 60 tablet 2  . ondansetron (ZOFRAN-ODT) 8 MG disintegrating tablet Take 1 tablet (8 mg total) by mouth every 8 (eight) hours as needed for nausea or vomiting. 30 tablet 0  . pantoprazole (PROTONIX) 40 MG tablet TAKE ONE TABLET BY MOUTH ONCE DAILY FOR REFLUX 30 tablet 5  . phentermine (ADIPEX-P) 37.5 MG tablet TAKE ONE TABLET BY MOUTH ONCE DAILY BEFORE  BREAKFAST 30 tablet 2  . Tiotropium Bromide Monohydrate (SPIRIVA RESPIMAT) 1.25 MCG/ACT AERS Inhale 2 puffs into the lungs daily. 4 g 3   Current Facility-Administered Medications  Medication Dose Route Frequency Provider Last Rate Last Dose  . omalizumab Arvid Right) injection 300 mg  300 mg Subcutaneous Q28 days Valentina Shaggy, MD   300 mg at 07/11/16 1359      ___________________________________________________________________ Objective   Exam:  BP 128/72   Pulse 82   Ht 5' (1.524 m)   Wt 266 lb 9.6 oz (120.9 kg)   BMI 52.07 kg/m    General: this is a(n) Morbidly obese woman, comfortable and conversational today.   Eyes: sclera anicteric, no redness  ENT: oral mucosa moist without lesions, no cervical or supraclavicular lymphadenopathy, good dentition  CV: RRR without murmur, S1/S2, no JVD, no peripheral edema  Resp: clear to auscultation bilaterally, normal RR and effort noted  GI: soft, no tenderness, with active bowel sounds. No guarding or palpable organomegaly noted. No visible or palpable  right lower anterior chest wall abnormalities  Skin; warm and dry, no rash or jaundice noted  Neuro: awake, alert and oriented x 3. Normal gross motor function and fluent speech  Labs:  CMP Latest Ref Rng & Units 08/26/2016 04/10/2016 03/05/2016  Glucose 65 - 99 mg/dL 97 - 100(H)  BUN 8 - 27 mg/dL 12 - 9  Creatinine 0.57 - 1.00 mg/dL 0.79 - 0.68  Sodium 134 - 144 mmol/L 141 - 140  Potassium 3.5 - 5.2 mmol/L 4.1 4.1 4.4  Chloride 96 - 106 mmol/L 100 - 105  CO2 18 - 29 mmol/L 26 - 28  Calcium 8.7 - 10.3 mg/dL 9.3 - 9.2  Total Protein 6.0 - 8.5 g/dL 7.1 - 6.5  Total Bilirubin 0.0 - 1.2 mg/dL 0.4 - 0.4  Alkaline Phos 39 - 117 IU/L 84 - 78  AST 0 - 40 IU/L 20 - 17  ALT 0 - 32 IU/L 22 - 17   CBC Latest Ref Rng & Units 08/26/2016 03/19/2015 10/27/2014  WBC 3.4 - 10.8 x10E3/uL 6.6 8.2 7.7  Hemoglobin 12.0 - 15.0 g/dL - 13.0 -  Hematocrit 34.0 - 46.6 % 41.5 38.6 40.6  Platelets 150 - 379 x10E3/uL 281 238 305     Radiologic Studies:  [place] - fatty liver, no gallstones  nml CCK-HIDA and no symptoms with injection Colonoscopy 4/17 ([place]) hyperplastic polyp  Assessment: Encounter Diagnoses  Name Primary?  . RUQ pain Yes  . Nausea without vomiting     This sounds most typical of biliary colic, despite normal imaging. Ultrasound has limited sensitivity for gallstones with her body habitus. It does not sound musculoskeletal, and also not typical for peptic ulcer. However, her normal imaging thus far requires further workup to rule out other upper GI pathology prior to surgical consultation. I advised her to have an upper endoscopy and she is agreeable.  The benefits and risks of the planned procedure were described in detail with the patient or (when appropriate)  their health care proxy.  Risks were outlined as including, but not limited to, bleeding, infection, perforation, adverse medication reaction leading to cardiac or pulmonary decompensation, or pancreatitis (if ERCP).  The limitation of  incomplete mucosal visualization was also discussed.  No guarantees or warranties were given. It is an increased risk procedure due to her body habitus and risks of sedation, thus a must be done in the hospital endoscopy lab.  If the EGD is normal, surgical consultation to follow.  Thank you for the courtesy of this consult.  Please call me with any questions or concerns.  [name]  CC: [name], MD

## 2016-10-15 NOTE — Patient Instructions (Signed)
If you are age 62 or older, your body mass index should be between 23-30. Your Body mass index is 52.07 kg/m. If this is out of the aforementioned range listed, please consider follow up with your Primary Care Provider.  If you are age 29 or younger, your body mass index should be between 19-25. Your Body mass index is 52.07 kg/m. If this is out of the aformentioned range listed, please consider follow up with your Primary Care Provider.   You have been scheduled for an endoscopy. Please follow written instructions given to you at your visit today. If you use inhalers (even only as needed), please bring them with you on the day of your procedure. Your physician has requested that you go to www.startemmi.com and enter the access code given to you at your visit today. This web site gives a general overview about your procedure. However, you should still follow specific instructions given to you by our office regarding your preparation for the procedure.  Thank you for choosing [facility]  Dr Wilfrid Lund III

## 2016-10-25 ENCOUNTER — Other Ambulatory Visit: Payer: Self-pay | Admitting: Nurse Practitioner

## 2016-11-06 ENCOUNTER — Other Ambulatory Visit: Payer: Self-pay

## 2016-11-07 ENCOUNTER — Ambulatory Visit: Payer: PPO | Admitting: Allergy & Immunology

## 2016-11-20 ENCOUNTER — Ambulatory Visit: Payer: PPO | Admitting: Allergy & Immunology

## 2016-11-25 ENCOUNTER — Encounter (HOSPITAL_COMMUNITY): Payer: Self-pay | Admitting: *Deleted

## 2016-11-29 ENCOUNTER — Ambulatory Visit (HOSPITAL_COMMUNITY): Payer: PPO | Admitting: Certified Registered Nurse Anesthetist

## 2016-11-29 ENCOUNTER — Encounter (HOSPITAL_COMMUNITY)
Admission: RE | Disposition: A | Discharge status: Home / Self Care | Payer: Self-pay | Source: Ambulatory Visit | Attending: Gastroenterology

## 2016-11-29 ENCOUNTER — Ambulatory Visit (HOSPITAL_COMMUNITY)
Admission: RE | Admit: 2016-11-29 | Discharge: 2016-11-29 | Disposition: A | Discharge status: Home / Self Care | Payer: PPO | Source: Ambulatory Visit | Attending: Gastroenterology | Admitting: Gastroenterology

## 2016-11-29 ENCOUNTER — Encounter (HOSPITAL_COMMUNITY): Payer: Self-pay | Admitting: *Deleted

## 2016-11-29 ENCOUNTER — Telehealth: Payer: Self-pay

## 2016-11-29 DIAGNOSIS — Z888 Allergy status to other drugs, medicaments and biological substances status: Secondary | ICD-10-CM | POA: Insufficient documentation

## 2016-11-29 DIAGNOSIS — Z87891 Personal history of nicotine dependence: Secondary | ICD-10-CM | POA: Insufficient documentation

## 2016-11-29 DIAGNOSIS — N6019 Diffuse cystic mastopathy of unspecified breast: Secondary | ICD-10-CM | POA: Insufficient documentation

## 2016-11-29 DIAGNOSIS — Z91040 Latex allergy status: Secondary | ICD-10-CM | POA: Insufficient documentation

## 2016-11-29 DIAGNOSIS — K2950 Unspecified chronic gastritis without bleeding: Secondary | ICD-10-CM | POA: Diagnosis not present

## 2016-11-29 DIAGNOSIS — J45909 Unspecified asthma, uncomplicated: Secondary | ICD-10-CM | POA: Diagnosis not present

## 2016-11-29 DIAGNOSIS — R110 Nausea: Secondary | ICD-10-CM

## 2016-11-29 DIAGNOSIS — Z85820 Personal history of malignant melanoma of skin: Secondary | ICD-10-CM | POA: Insufficient documentation

## 2016-11-29 DIAGNOSIS — K760 Fatty (change of) liver, not elsewhere classified: Secondary | ICD-10-CM | POA: Insufficient documentation

## 2016-11-29 DIAGNOSIS — K2970 Gastritis, unspecified, without bleeding: Secondary | ICD-10-CM

## 2016-11-29 DIAGNOSIS — Z96653 Presence of artificial knee joint, bilateral: Secondary | ICD-10-CM | POA: Insufficient documentation

## 2016-11-29 DIAGNOSIS — M1990 Unspecified osteoarthritis, unspecified site: Secondary | ICD-10-CM | POA: Diagnosis not present

## 2016-11-29 DIAGNOSIS — R1011 Right upper quadrant pain: Secondary | ICD-10-CM | POA: Diagnosis not present

## 2016-11-29 DIAGNOSIS — Z90710 Acquired absence of both cervix and uterus: Secondary | ICD-10-CM | POA: Diagnosis not present

## 2016-11-29 DIAGNOSIS — Z6841 Body Mass Index (BMI) 40.0 and over, adult: Secondary | ICD-10-CM | POA: Insufficient documentation

## 2016-11-29 DIAGNOSIS — Z881 Allergy status to other antibiotic agents status: Secondary | ICD-10-CM | POA: Insufficient documentation

## 2016-11-29 DIAGNOSIS — Z87442 Personal history of urinary calculi: Secondary | ICD-10-CM | POA: Insufficient documentation

## 2016-11-29 DIAGNOSIS — G5603 Carpal tunnel syndrome, bilateral upper limbs: Secondary | ICD-10-CM | POA: Insufficient documentation

## 2016-11-29 DIAGNOSIS — K219 Gastro-esophageal reflux disease without esophagitis: Secondary | ICD-10-CM | POA: Diagnosis not present

## 2016-11-29 DIAGNOSIS — Z86010 Personal history of colonic polyps: Secondary | ICD-10-CM | POA: Insufficient documentation

## 2016-11-29 DIAGNOSIS — H9319 Tinnitus, unspecified ear: Secondary | ICD-10-CM | POA: Diagnosis not present

## 2016-11-29 DIAGNOSIS — G43909 Migraine, unspecified, not intractable, without status migrainosus: Secondary | ICD-10-CM | POA: Diagnosis not present

## 2016-11-29 HISTORY — DX: Personal history of urinary calculi: Z87.442

## 2016-11-29 HISTORY — DX: Other complications of anesthesia, initial encounter: T88.59XA

## 2016-11-29 HISTORY — DX: Adverse effect of unspecified anesthetic, initial encounter: T41.45XA

## 2016-11-29 HISTORY — PX: ESOPHAGOGASTRODUODENOSCOPY (EGD) WITH PROPOFOL: SHX5813

## 2016-11-29 SURGERY — ESOPHAGOGASTRODUODENOSCOPY (EGD) WITH PROPOFOL
Anesthesia: Monitor Anesthesia Care

## 2016-11-29 MED ORDER — PROPOFOL 500 MG/50ML IV EMUL
INTRAVENOUS | Status: DC | PRN
  Administered 2016-11-29: 125 ug/kg/min via INTRAVENOUS

## 2016-11-29 MED ORDER — PROPOFOL 10 MG/ML IV BOLUS
INTRAVENOUS | Status: AC
  Filled 2016-11-29: qty 40

## 2016-11-29 MED ORDER — SODIUM CHLORIDE 0.9 % IV SOLN: INTRAVENOUS | Status: DC

## 2016-11-29 MED ORDER — PROPOFOL 10 MG/ML IV BOLUS
INTRAVENOUS | Status: DC | PRN
  Administered 2016-11-29 (×2): 30 mg via INTRAVENOUS

## 2016-11-29 MED ORDER — LACTATED RINGERS IV SOLN
INTRAVENOUS | Status: DC
  Administered 2016-11-29 (×2): via INTRAVENOUS

## 2016-11-29 MED ORDER — LIDOCAINE HCL (CARDIAC) 20 MG/ML IV SOLN
INTRAVENOUS | Status: DC | PRN
  Administered 2016-11-29: 50 mg via INTRAVENOUS

## 2016-11-29 MED ORDER — LIDOCAINE 2% (20 MG/ML) 5 ML SYRINGE
INTRAMUSCULAR | Status: AC
  Filled 2016-11-29: qty 5

## 2016-11-29 SURGICAL SUPPLY — 14 items

## 2016-11-29 NOTE — Discharge Instructions (Signed)
YOU HAD AN ENDOSCOPIC PROCEDURE TODAY: Refer to the procedure report and other information in the discharge instructions given to you for any specific questions about what was found during the examination. If this information does not answer your questions, please call [facility] office at [phone] to clarify.  ° °YOU SHOULD EXPECT: Some feelings of bloating in the abdomen. Passage of more gas than usual. Walking can help get rid of the air that was put into your GI tract during the procedure and reduce the bloating. If you had a lower endoscopy (such as a colonoscopy or flexible sigmoidoscopy) you may notice spotting of blood in your stool or on the toilet paper. Some abdominal soreness may be present for a day or two, also. ° °DIET: Your first meal following the procedure should be a light meal and then it is ok to progress to your normal diet. A half-sandwich or bowl of soup is an example of a good first meal. Heavy or fried foods are harder to digest and may make you feel nauseous or bloated. Drink plenty of fluids but you should avoid alcoholic beverages for 24 hours. If you had a esophageal dilation, please see attached instructions for diet.   ° °ACTIVITY: Your care partner should take you home directly after the procedure. You should plan to take it easy, moving slowly for the rest of the day. You can resume normal activity the day after the procedure however YOU SHOULD NOT DRIVE, use power tools, machinery or perform tasks that involve climbing or major physical exertion for 24 hours (because of the sedation medicines used during the test).  ° °SYMPTOMS TO REPORT IMMEDIATELY: °A gastroenterologist can be reached at any hour. Please call [phone]  for any of the following symptoms:  °Following lower endoscopy (colonoscopy, flexible sigmoidoscopy) °Excessive amounts of blood in the stool  °Significant tenderness, worsening of abdominal pains  °Swelling of the abdomen that is new, acute  °Fever of 100° or  higher  °Following upper endoscopy (EGD, EUS, ERCP, esophageal dilation) °Vomiting of blood or coffee ground material  °New, significant abdominal pain  °New, significant chest pain or pain under the shoulder blades  °Painful or persistently difficult swallowing  °New shortness of breath  °Black, tarry-looking or red, bloody stools ° °FOLLOW UP:  °If any biopsies were taken you will be contacted by phone or by letter within the next 1-3 weeks. Call [phone]  if you have not heard about the biopsies in 3 weeks.  °Please also call with any specific questions about appointments or follow up tests. ° °

## 2016-11-29 NOTE — Anesthesia Preprocedure Evaluation (Signed)
Anesthesia Evaluation  Patient identified by MRN, date of birth, ID band Patient awake    Reviewed: Allergy & Precautions, NPO status , Patient's Chart, lab work & pertinent test results  History of Anesthesia Complications (+) history of anesthetic complications  Airway Mallampati: III  TM Distance: <3 FB Neck ROM: Full    Dental  (+) Teeth Intact   Pulmonary shortness of breath, asthma , neg sleep apnea, neg recent URI, former smoker,    breath sounds clear to auscultation       Cardiovascular negative cardio ROS   Rhythm:Regular     Neuro/Psych  Headaches,  Neuromuscular disease    GI/Hepatic Neg liver ROS, GERD  Medicated,  Endo/Other  Morbid obesity  Renal/GU negative Renal ROS     Musculoskeletal  (+) Arthritis ,   Abdominal   Peds  Hematology negative hematology ROS (+)   Anesthesia Other Findings Albuterol puffs preop  Reproductive/Obstetrics                             Anesthesia Physical Anesthesia Plan  ASA: III  Anesthesia Plan: MAC   Post-op Pain Management:    Induction: Intravenous  PONV Risk Score and Plan: 3 and Treatment may vary due to age or medical condition  Airway Management Planned: Nasal Cannula and Simple Face Mask  Additional Equipment: None  Intra-op Plan:   Post-operative Plan:   Informed Consent: I have reviewed the patients History and Physical, chart, labs and discussed the procedure including the risks, benefits and alternatives for the proposed anesthesia with the patient or authorized representative who has indicated his/her understanding and acceptance.   Dental advisory given  Plan Discussed with: CRNA and Surgeon  Anesthesia Plan Comments:         Anesthesia Quick Evaluation

## 2016-11-29 NOTE — Telephone Encounter (Signed)
Records sent to [facility]. Will await appointment information.

## 2016-11-29 NOTE — H&P (Signed)
History:  This patient presents for endoscopic testing for RUQ pain.  [name] Referring physician: [name], MD  Past Medical History: Past Medical History:  Diagnosis Date  . Arthritis   . Asthma   . Carpal tunnel syndrome    bilateral  . Colon polyps   . Complication of anesthesia    confusion for a couple of days after waking up, does well with spinal  . Degenerative disk disease    mainly back  . Fatty liver   . Fibrocystic breast disease 1996  . GERD (gastroesophageal reflux disease)   . History of kidney stones    passed on own  . Melanoma ([place])   . Migraine    since age 13 not as frequent now  . Pneumonia winter 2017  . Tinnitus      Past Surgical History: Past Surgical History:  Procedure Laterality Date  . ABDOMINAL HYSTERECTOMY    . APPENDECTOMY    . BREAST BIOPSY     right  . BREAST EXCISIONAL BIOPSY Right    two biopsies on the right  . COLONOSCOPY  07/2005   RMR: normal rectum, left-sided diverticular disease, otehrwise colon mucosa normal.  . COLONOSCOPY N/A 09/14/2015   Procedure: COLONOSCOPY;  Surgeon: Daneil Dolin, MD;  Location: [facility];  Service: Endoscopy;  Laterality: N/A;  930 - moved to 9:15 - office to notify  . menisus repair on rt knee     bilateral  . TONSILLECTOMY    . TOTAL KNEE ARTHROPLASTY  2008   left   . TOTAL KNEE ARTHROPLASTY  06/17/2011   Procedure: TOTAL KNEE ARTHROPLASTY;  Surgeon: Arther Abbott, MD;  Location: [facility];  Service: Orthopedics;  Laterality: Right;  [place]  . TOTAL KNEE REVISION  2.12.2012   left  . TUBAL LIGATION    . VESICOVAGINAL FISTULA CLOSURE W/ TAH      Allergies: Allergies  Allergen Reactions  . Adhesive [Tape] Hives and Dermatitis    Can tolerate tegaderm  . Augmentin [Amoxicillin-Pot Clavulanate] Diarrhea and Nausea Only  . Latex Hives    [place]  . Neomycin-Bacitracin Zn-Polymyx Dermatitis    Outpatient Meds: Current Facility-Administered Medications  Medication  Dose Route Frequency Provider Last Rate Last Dose  . lactated ringers infusion   Intravenous Continuous [name], MD 20 mL/hr at 11/29/16 0820        ___________________________________________________________________ Objective   Exam:  BP (!) 170/73   Pulse 76   Temp 98 F (36.7 C) (Oral)   Resp 19   Ht 5' (1.524 m)   Wt 263 lb (119.3 kg)   SpO2 96%   BMI 51.36 kg/m    CV: RRR without murmur, S1/S2, no JVD, no peripheral edema  Resp: clear to auscultation bilaterally, normal RR and effort noted  GI: soft, no tenderness, with active bowel sounds. No guarding or palpable organomegaly noted.  Neuro: awake, alert and oriented x 3. Normal gross motor function and fluent speech   Assessment:  RUQ pain   Plan:  EGD   [name]

## 2016-11-29 NOTE — Interval H&P Note (Signed)
History and Physical Interval Note:  11/29/2016 9:18 AM  Sharon [name]  has presented today for surgery, with the diagnosis of RIGHT UPPER QUADRANT PAIN  The various methods of treatment have been discussed with the patient and family. After consideration of risks, benefits and other options for treatment, the patient has consented to  Procedure(s): ESOPHAGOGASTRODUODENOSCOPY (EGD) WITH PROPOFOL (N/A) as a surgical intervention .  The patient's history has been reviewed, patient examined, no change in status, stable for surgery.  I have reviewed the patient's chart and labs.  Questions were answered to the patient's satisfaction.     [name]

## 2016-11-29 NOTE — Transfer of Care (Signed)
Immediate Anesthesia Transfer of Care Note  Patient: Sharon [name]  Procedure(s) Performed: Procedure(s): ESOPHAGOGASTRODUODENOSCOPY (EGD) WITH PROPOFOL (N/A)  Patient Location: PACU  Anesthesia Type:MAC  Level of Consciousness:  sedated, patient cooperative and responds to stimulation  Airway & Oxygen Therapy:Patient Spontanous Breathing and Patient connected to face mask oxgen  Post-op Assessment:  Report given to PACU RN and Post -op Vital signs reviewed and stable  Post vital signs:  Reviewed and stable  Last Vitals:  Vitals:   11/29/16 0810 11/29/16 0944  BP: (!) 170/73 (!) 170/83  Pulse: 76 76  Resp: 19 16  Temp: 82.0 C     Complications: No apparent anesthesia complications

## 2016-11-29 NOTE — Op Note (Signed)
[facility] Patient Name: Sharon [name] Procedure Date: 11/29/2016 MRN: [id number] Attending MD: Estill Cotta. Loletha Carrow , MD Date of Birth: 1956/03/13 CSN: [id number] Age: 61 Admit Type: Outpatient Procedure:                Upper GI endoscopy Indications:              Abdominal pain in the right upper quadrant Providers:                Mallie Mussel L. Loletha Carrow, MD, Laverta Baltimore RN, RN, Marcene Duos, Technician Referring MD:             Baltazar Apo, MD Medicines:                Monitored Anesthesia Care Complications:            No immediate complications. Estimated Blood Loss:     Estimated blood loss: none. Procedure:                Pre-Anesthesia Assessment:                           - Prior to the procedure, a History and Physical                            was performed, and patient medications and                            allergies were reviewed. The patient's tolerance of                            previous anesthesia was also reviewed. The risks                            and benefits of the procedure and the sedation                            options and risks were discussed with the patient.                            All questions were answered, and informed consent                            was obtained. Prior Anticoagulants: The patient has                            taken no previous anticoagulant or antiplatelet                            agents. ASA Grade Assessment: III - A patient with                            severe systemic disease. After reviewing the risks  and benefits, the patient was deemed in                            satisfactory condition to undergo the procedure.                           After obtaining informed consent, the endoscope was                            passed under direct vision. Throughout the                            procedure, the patient's blood pressure, pulse, and                        oxygen saturations were monitored continuously. The                            EG-[id number] ([id number]) scope was introduced through the                            mouth, and advanced to the second part of duodenum.                            The upper GI endoscopy was accomplished without                            difficulty. The patient tolerated the procedure                            well. Scope In: Scope Out: Findings:      The esophagus was normal.      Patchy mild inflammation characterized by adherent blood and congestion       (edema) was found in the gastric antrum. Biopsies were taken with a cold       forceps for histology.      The cardia and gastric fundus were normal on retroflexion.      The examined duodenum was normal. Impression:               - Normal esophagus.                           - Gastritis. Biopsied. Most likely NSAID-induced                            and not likely to be the cause of symptoms.                           - Normal examined duodenum.                           The pain sounds most like biliary colic, despite                            recent normal [place] and CCK-HIDA. Moderate Sedation:  N/A- Per Anesthesia Care Recommendation:           - Patient has a contact number available for                            emergencies. The signs and symptoms of potential                            delayed complications were discussed with the                            patient. Return to normal activities tomorrow.                            Written discharge instructions were provided to the                            patient.                           - Resume previous diet.                           - Continue present medications.                           - Await pathology results.                           - Refer to a surgeon at the next available                            appointment. Procedure Code(s):        --- Professional  ---                           [phone], Esophagogastroduodenoscopy, flexible,                            transoral; with biopsy, single or multiple Diagnosis Code(s):        --- Professional ---                           K29.70, Gastritis, unspecified, without bleeding                           R10.11, Right upper quadrant pain CPT copyright 2016 American Medical Association. All rights reserved. The codes documented in this report are preliminary and upon coder review may  be revised to meet current compliance requirements.  L. Loletha Carrow, MD 11/29/2016 9:42:54 AM This report has been signed electronically. Number of Addenda: 0

## 2016-11-30 NOTE — Anesthesia Postprocedure Evaluation (Signed)
Anesthesia Post Note  Patient: Sharon [name]  Procedure(s) Performed: Procedure(s) (LRB): ESOPHAGOGASTRODUODENOSCOPY (EGD) WITH PROPOFOL (N/A)     Patient location during evaluation: Endoscopy Anesthesia Type: MAC Level of consciousness: awake and alert Pain management: pain level controlled Vital Signs Assessment: post-procedure vital signs reviewed and stable Respiratory status: spontaneous breathing, nonlabored ventilation, respiratory function stable and patient connected to nasal cannula oxygen Cardiovascular status: stable and blood pressure returned to baseline Anesthetic complications: no    Last Vitals:  Vitals:   11/29/16 1000 11/29/16 1010  BP: (!) 179/82 (!) 205/81  Pulse: 68 72  Resp: 10 13  Temp:      Last Pain:  Vitals:   11/29/16 0944  TempSrc: Oral                 Ignace Mandigo

## 2016-12-02 ENCOUNTER — Ambulatory Visit (INDEPENDENT_AMBULATORY_CARE_PROVIDER_SITE_OTHER): Payer: PPO | Admitting: Allergy & Immunology

## 2016-12-02 ENCOUNTER — Encounter: Payer: Self-pay | Admitting: Allergy & Immunology

## 2016-12-02 ENCOUNTER — Encounter: Payer: Self-pay | Admitting: Nurse Practitioner

## 2016-12-02 ENCOUNTER — Ambulatory Visit (INDEPENDENT_AMBULATORY_CARE_PROVIDER_SITE_OTHER): Payer: PPO | Admitting: Nurse Practitioner

## 2016-12-02 VITALS — BP 142/86 | HR 85 | Temp 98.4°F | Ht 60.0 in | Wt 269.0 lb

## 2016-12-02 VITALS — BP 152/82 | Ht 60.0 in | Wt 262.1 lb

## 2016-12-02 DIAGNOSIS — Z01419 Encounter for gynecological examination (general) (routine) without abnormal findings: Secondary | ICD-10-CM | POA: Diagnosis not present

## 2016-12-02 DIAGNOSIS — L508 Other urticaria: Secondary | ICD-10-CM

## 2016-12-02 DIAGNOSIS — J4540 Moderate persistent asthma, uncomplicated: Secondary | ICD-10-CM | POA: Diagnosis not present

## 2016-12-02 DIAGNOSIS — J3089 Other allergic rhinitis: Secondary | ICD-10-CM | POA: Diagnosis not present

## 2016-12-02 DIAGNOSIS — K219 Gastro-esophageal reflux disease without esophagitis: Secondary | ICD-10-CM | POA: Diagnosis not present

## 2016-12-02 DIAGNOSIS — T7800XD Anaphylactic reaction due to unspecified food, subsequent encounter: Secondary | ICD-10-CM | POA: Diagnosis not present

## 2016-12-02 DIAGNOSIS — Z0000 Encounter for general adult medical examination without abnormal findings: Secondary | ICD-10-CM

## 2016-12-02 DIAGNOSIS — T7800XA Anaphylactic reaction due to unspecified food, initial encounter: Secondary | ICD-10-CM | POA: Insufficient documentation

## 2016-12-02 NOTE — Telephone Encounter (Signed)
[facility] appointment with Dr Kieth Brightly 12-25-2016 @ 1:45pm with arrival time of 1:15pm.

## 2016-12-02 NOTE — Patient Instructions (Addendum)
1. 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 - EpiPen up to date.  2. Chronic allergic rhinitis - Continue with Rhinocort two sprays per nostril daily. - Continue with Allegra daily.  3. Moderate persistent asthma, uncomplicated (failed Xolair) - Lung testing looked great today, so I hate to change much at this point. - We will see whether the surgery will help with the pain and the elevated blood pressure readings. - We will look into the charges for Fasenra (an anti-eosinophil injectable medication). - Continue with Breo 200/25 one inhalation once daily. - Continue with Spiriva two inhalations once daily.  - Daily controller medication(s): Breo 200/25 one inhalation once daily + Spiriva two inhalations once daily - Rescue medications: ProAir 4 puffs every 4-6 hours as needed or albuterol nebulizer one vial puffs every 4-6 hours as needed - Asthma control goals:  * Full participation in all desired activities (may need albuterol before activity) * Albuterol use two time or less a week on average (not counting use with activity) * Cough interfering with sleep two time or less a month * Oral steroids no more than once a year * No hospitalizations  3. Return in about 3 months (around 03/04/2017).   Please inform us of any Emergency Department visits, hospitalizations, or changes in symptoms. Call us before going to the ED for breathing or allergy symptoms since we might be able to fit you in for a sick visit. Feel free to contact us anytime with any questions, problems, or concerns.  It was a pleasure to see you again today! Happy summer!   Websites that have reliable patient information: 1. American Academy of Asthma, Allergy, and Immunology: www.aaaai.org 2. Food Allergy Research and Education (FARE): foodallergy.org 3. Mothers of Asthmatics: http://www.asthmacommunitynetwork.org 4. American College of Allergy, Asthma, and Immunology:  www.acaai.org

## 2016-12-02 NOTE — Progress Notes (Signed)
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 Chronic urticaria with alpha-gal sensitivity - Continue to avoid lamb and pork. - EpiPen up to date.  2. GERD - Continue with Protonix 40mg  once daily  3. Chronic allergic rhinitis - Continue with Rhinocort two sprays per nostril daily. - Continue with Allegra daily.  4. Moderate persistent asthma, uncomplicated (failed Xolair) - Lung testing looked great today, so I hate to change much at this point. - We will see whether the surgery will help with the pain and the elevated blood pressure readings. - We will look into the charges for Fasenra (an anti-eosinophil injectable medication). - Continue with Breo 200/25 one inhalation once daily. - Continue with Spiriva two inhalations once daily.  - Daily controller medication(s): Breo 200/25 one inhalation once daily + Spiriva two inhalations once daily - Rescue medications: ProAir 4 puffs every 4-6 hours as needed or albuterol nebulizer one vial puffs every 4-6 hours as needed - Asthma control goals:  * Full participation in all desired activities (may need albuterol before activity) * Albuterol use two time or less a week on average (not counting use with activity) * Cough interfering with sleep two time or less a month * Oral steroids no more than once a year * No hospitalizations  5. Return in about 3 months (around 03/04/2017).    Subjective:   Sharon [name] is a 61 y.o. female presenting today for follow up of  Chief Complaint  Patient presents with    . Asthma    Started Breo and Spiriva last visit. C/O shortness of breath in the afternoon.   . Follow-up    C/O swelling in feet since started the inhalers.    Sharon [name] has a history of the following: Patient Active Problem List   Diagnosis Date Noted  . Moderate persistent asthma, uncomplicated 16/38/4536  . Chronic nonseasonal allergic rhinitis due to fungal spores 12/02/2016  . Chronic urticaria 12/02/2016  . Anaphylactic shock due to adverse food reaction 12/02/2016  . History of colonic polyps   . Diverticulosis of colon without hemorrhage   . Asthma, mild intermittent 08/30/2015  . Encounter for screening colonoscopy 08/25/2015  . Wheezing 08/08/2015  . Gastroesophageal reflux disease 08/08/2015  . Morbid obesity ([place]) 07/14/2013  . Status post right knee replacement 06/17/2013  . Status post left knee replacement 06/17/2013  . DDD (degenerative disc disease), lumbosacral 10/06/2012  . Hip pain 10/06/2012  . Radicular leg pain 10/06/2012  . Bursitis of hip, right 06/25/2012  . Trochanteric bursitis of both hips 09/11/2011  . S/P total knee replacement 07/03/2011  . Knee pain 03/20/2011  . OA (osteoarthritis) of knee 12/12/2010  . Mononeuritis of leg 10/24/2010  . Chronic pain 10/24/2010  . OTHER POSTSURGICAL STATUS OTHER 07/24/2010  . STRESS FRACTURE, TIBIA 04/11/2010  . TENOSYNOVITIS OF FOOT AND ANKLE 02/15/2010  . LOOSE BODY-KNEE 01/10/2010  . DERANGEMENT MENISCUS 01/01/2010  . [place] HEAD 01/01/2010  . SHOULDER PAIN 04/19/2009  . IMPINGEMENT SYNDROME 04/19/2009  . HERNIATED LUMBOSACRAL DISC 09/01/2007  . UNSPECIFIED NEURALGIA NEURITIS AND RADICULITIS 07/09/2007  . TOTAL KNEE FOLLOW-UP 06/25/2007  .  OSTEOARTHROSIS, LOCAL, PRIMARY, LOWER LEG 03/24/2007  . Osteoarthrosis, unspecified whether generalized or localized, lower leg 03/09/2007  . KNEE PAIN 02/02/2007  . TEAR MEDIAL MENISCUS 02/02/2007    History obtained from: chart review and  patient.  Sharon [name] was referred by Sharon Kirschner, MD.     [name] is a 61yo female with a history of moderate persistent asthma as well as chronic allergic rhinitis presenting for a follow up appointment. She was last seen in mid May 2018. At that time, her spirometry was lackluster, therefore we changed her to [facility] 200/25 one puff once daily. Her asthma has not been well controlled. She did take Xolair for a couple of months, but she never felt improvement and the copayments were overly expensive. For her allergic rhinitis, we continued her on Rhinocort two sprays per nostril daily as well as Allegra daily.   Since the last visit, she has mostly done well. She remains on the Spiriva (in morning) and Breo (at night). However she endorses SOB every afternoon and she does take albuterol. She has been avoiding the outdoors due her breathing problems. She has been compliant with her Breo inhaler and has been using it on a daily basis. She reports that around 2 PM she starts to have hoarseness, which she then feels chest tightness. She does not develop thrush. She has been on other controller medications, including Symbicort and Dulera, with resulting thrush. She does rinse her mouth out very well after each administration. She does take albuterol during these episodes with minimal improvement. She does have a history of reflux and is on Protonix 40 mg 1 tablet daily. Of note, she recently had an upper endoscopy as part of a workup for right upper quadrant abdominal pain, which reportedly was normal. She denies postnasal drip.  She continues to use Allegra as well as Rhinocort for her allergic rhinitis. She does not feel that she has postnasal drip, but does clear her throat fairly often. Her allergy symptoms have been under good control as long she stays inside.  She continues to have right upper quadrant abdominal pain since December. She is scheduled to be evaluated by a surgeon on August  6 for possible gallbladder removal. She feels that the pain certainly has been contributing to her blood pressure. She has no history of hypertension and has never been on any hypertension medications. Her blood pressure has ranged up to a systolic in the 400Q, which occurred before her endoscopy. She feels that her inhalers might be contributing to her blood pressure, as the blood pressure she started after the Breo was started.   She has had no problems with hives and remains off of pork. She does have an up-to-date EpiPen.  Otherwise, there have been no changes to her past medical history, surgical history, family history, or social history.She is disabled, and does have a hard time affording her medications.    Review of Systems: a 14-point review of systems is pertinent for what is mentioned in HPI.  Otherwise, all other systems were negative. Constitutional: negative other than that listed in the HPI Eyes: negative other than that listed in the HPI Ears, nose, mouth, throat, and face: negative other than that listed in the HPI Respiratory: negative other than that listed in the HPI Cardiovascular: negative other than that listed in the HPI Gastrointestinal: negative other than that listed in the HPI Genitourinary: negative other than that listed in the HPI Integument: negative other than that listed  in the HPI Hematologic: negative other than that listed in the HPI Musculoskeletal: negative other than that listed in the HPI Neurological: negative other than that listed in the HPI Allergy/Immunologic: negative other than that listed in the HPI    Objective:   Blood pressure (!) 142/86, pulse 85, temperature 98.4 F (36.9 C), height 5' (1.524 m), weight 269 lb (122 kg), SpO2 96 %. Body mass index is 52.54 kg/m.   Physical Exam:  General: Alert, interactive, in no acute distress. Obese pleasant female.  Eyes: No conjunctival injection present on the right, No conjunctival  injection present on the left, PERRL bilaterally, No discharge on the right, No discharge on the left and No Horner-Trantas dots present Ears: Right TM pearly gray with normal light reflex, Left TM pearly gray with normal light reflex, Right TM intact without perforation and Left TM intact without perforation.  Nose/Throat: External nose within normal limits, nasal crease present and septum midline, turbinates markedly edematous with clear discharge, post-pharynx erythematous without cobblestoning in the posterior oropharynx. Tonsils 2+ without exudates Neck: Supple without thyromegaly. Lungs: Clear to auscultation without wheezing, rhonchi or rales. No increased work of breathing. CV: Normal S1/S2, no murmurs. Capillary refill <2 seconds.  Skin: Warm and dry, without lesions or rashes. Neuro:   Grossly intact. No focal deficits appreciated. Responsive to questions.   Diagnostic studies:   Spirometry: results normal (FEV1: 1.82/89%, FVC: 2.06/82%, FEV1/FVC: 88%).    Spirometry consistent with normal pattern. This is the best that her spirometry has ever looked since I have worked with her.  Allergy Studies: none     Salvatore Marvel, MD [facility]

## 2016-12-02 NOTE — Progress Notes (Signed)
   Subjective:    Patient ID: Sharon [name], female    DOB: [birth year], 61 y.o.   MRN: [id number]  HPI presents for her wellness exam. No new sexual partners. Has had a hysterectomy. No pelvic pain. Limited activity due to breathing issues; has a recheck with her asthma specialist this week. Worse with extreme heat this time of year. Taking daily vitamin D. Regular vision and dental exams. Persistent RUQ abdominal pain has been evaluated by GI specialist. Now referred to surgeon for evaluation of gallbladder.     Review of Systems  Constitutional: Negative for activity change, appetite change and fatigue.  HENT: Negative for dental problem, ear pain, sinus pressure and sore throat.   Respiratory: Positive for cough and wheezing. Negative for chest tightness and shortness of breath.        Denies symptoms of orthopnea.   Cardiovascular: Positive for leg swelling. Negative for chest pain and palpitations.  Gastrointestinal: Positive for abdominal pain. Negative for abdominal distention, constipation, diarrhea, nausea and vomiting.  Genitourinary: Negative for difficulty urinating, dysuria, enuresis, frequency, genital sores, pelvic pain, urgency and vaginal discharge.       Objective:   Physical Exam  Constitutional: She is oriented to person, place, and time. She appears well-developed. No distress.  HENT:  Right Ear: External ear normal.  Left Ear: External ear normal.  Mouth/Throat: Oropharynx is clear and moist.  Neck: Normal range of motion. Neck supple. No tracheal deviation present. No thyromegaly present.  Cardiovascular: Normal rate, regular rhythm and normal heart sounds.  Exam reveals no gallop.   No murmur heard. Pulmonary/Chest: Effort normal and breath sounds normal. No respiratory distress. She has no wheezes. She has no rales.  Abdominal: Soft. She exhibits no distension. There is tenderness. There is rebound and guarding.  Distinct tenderness RUQ; limited exam due to  pain.   Genitourinary:  Genitourinary Comments: Defers GU exam. Denies any problems.   Musculoskeletal: She exhibits edema.  Trace pitting edema LE.  Lymphadenopathy:    She has no cervical adenopathy.  Neurological: She is alert and oriented to person, place, and time.  Skin: Skin is warm and dry. No rash noted.  Psychiatric: She has a normal mood and affect. Her behavior is normal.  Vitals reviewed. Breast exam: no masses; axillae no adenopathy.        Assessment & Plan:  Well woman exam  Continue follow up with specialists as planned. Encouraged activity as tolerated. Given Rx for Shingrix if not too expensive.  Return in about 6 months (around 06/04/2017) for recheck.

## 2016-12-04 NOTE — Addendum Note (Signed)
Addended by: Lucrezia Starch I on: 12/04/2016 09:07 AM   Modules accepted: Orders

## 2016-12-25 DIAGNOSIS — R109 Unspecified abdominal pain: Secondary | ICD-10-CM | POA: Diagnosis not present

## 2016-12-25 DIAGNOSIS — K828 Other specified diseases of gallbladder: Secondary | ICD-10-CM | POA: Diagnosis not present

## 2016-12-27 ENCOUNTER — Other Ambulatory Visit (HOSPITAL_COMMUNITY): Payer: Self-pay | Admitting: General Surgery

## 2016-12-27 DIAGNOSIS — R109 Unspecified abdominal pain: Secondary | ICD-10-CM

## 2016-12-31 ENCOUNTER — Telehealth: Payer: Self-pay | Admitting: *Deleted

## 2016-12-31 ENCOUNTER — Encounter (HOSPITAL_COMMUNITY): Payer: Self-pay

## 2016-12-31 ENCOUNTER — Ambulatory Visit (HOSPITAL_COMMUNITY)
Admission: RE | Admit: 2016-12-31 | Discharge: 2016-12-31 | Disposition: A | Discharge status: Home / Self Care | Payer: PPO | Source: Ambulatory Visit | Attending: General Surgery | Admitting: General Surgery

## 2016-12-31 DIAGNOSIS — R109 Unspecified abdominal pain: Secondary | ICD-10-CM | POA: Diagnosis not present

## 2016-12-31 DIAGNOSIS — K760 Fatty (change of) liver, not elsewhere classified: Secondary | ICD-10-CM | POA: Diagnosis not present

## 2016-12-31 LAB — POCT I-STAT CREATININE: Creatinine, Ser: 0.8 mg/dL (ref 0.44–1.00)

## 2016-12-31 MED ORDER — IOPAMIDOL (ISOVUE-300) INJECTION 61%
100.0000 mL | Freq: Once | INTRAVENOUS | Status: AC | PRN
  Administered 2016-12-31: 100 mL via INTRAVENOUS

## 2016-12-31 NOTE — Telephone Encounter (Signed)
Have tried to contact patient numerous times regarding starting on biologics with no response.  The first time I spoke to patient on 07/26/16 she advised she could not afford therapy and I advised there are assistance programs out there that she may get help from. At that time she advised she would think it over and let me know.  On 10/14/16 Dr Ernst Bowler sent message advising to try and get patient on [place] so I L/M for patient to call me and mailed application for free drug and instructions to patient.  Since mailing application I have left message for patient to contact me on 12/10/16 and 12/26/16 with still no response. I will assume she is not interested in starting biologic so I will not try to contact her any further.

## 2016-12-31 NOTE — Telephone Encounter (Signed)
Noted - thanks for all of your hard work with this, Tammy!   Salvatore Marvel, MD [facility] [place]

## 2017-01-02 ENCOUNTER — Other Ambulatory Visit: Payer: Self-pay | Admitting: Nurse Practitioner

## 2017-01-06 ENCOUNTER — Encounter: Payer: Self-pay | Admitting: Nurse Practitioner

## 2017-01-09 ENCOUNTER — Ambulatory Visit (HOSPITAL_COMMUNITY): Payer: PPO

## 2017-01-20 ENCOUNTER — Ambulatory Visit: Payer: Self-pay | Admitting: General Surgery

## 2017-01-22 ENCOUNTER — Other Ambulatory Visit: Payer: Self-pay | Admitting: Nurse Practitioner

## 2017-01-24 ENCOUNTER — Encounter (HOSPITAL_BASED_OUTPATIENT_CLINIC_OR_DEPARTMENT_OTHER): Payer: Self-pay | Admitting: *Deleted

## 2017-01-28 ENCOUNTER — Encounter (HOSPITAL_BASED_OUTPATIENT_CLINIC_OR_DEPARTMENT_OTHER): Payer: Self-pay | Admitting: *Deleted

## 2017-01-28 NOTE — Progress Notes (Addendum)
NPO AFTER MN.  ARRIVE AT 0600.  NEEDS HEPARIN INJECTION ON ARRIVAL.  GETTING LAB WORK DONE AT [facility] Wednesday 01-29-2017, APPROX. 1000 (CBC AND CMET) , ORDERS NEED TO BE RELEASE THAT MORNING.  WILL DO SPIRIVA INHALER AM DOS W/ SIPS OF WATER AND IF NEEDED TAKE HYDROCODONE/ ZOFRAN.  WILL DO HIBICLENS SHOWER HS BEFORE AND AM DOS.   SPOKE W/ FELICIA , [place] STAFF MEMBER. LET HER KNOW PT COMING FOR LAB WORK TOMORROW , Wednesday 01-29-2017 AT 1000 AND FAXED HER REQUISTIONS  AND TOLD HER ORDERS WILL BE RELEASED AM 01-29-2017 IN EPIC.

## 2017-01-29 ENCOUNTER — Other Ambulatory Visit (HOSPITAL_COMMUNITY)
Admission: RE | Admit: 2017-01-29 | Discharge: 2017-01-29 | Disposition: A | Discharge status: Home / Self Care | Payer: PPO | Source: Ambulatory Visit | Attending: General Surgery | Admitting: General Surgery

## 2017-01-29 DIAGNOSIS — K828 Other specified diseases of gallbladder: Secondary | ICD-10-CM | POA: Diagnosis not present

## 2017-01-29 DIAGNOSIS — Z01812 Encounter for preprocedural laboratory examination: Secondary | ICD-10-CM | POA: Insufficient documentation

## 2017-01-29 LAB — CBC WITH DIFFERENTIAL/PLATELET
BASOS ABS: 0 10*3/uL (ref 0.0–0.1)
BASOS PCT: 0 %
EOS PCT: 3 %
Eosinophils Absolute: 0.3 10*3/uL (ref 0.0–0.7)
HCT: 45.1 % (ref 36.0–46.0)
Hemoglobin: 15.1 g/dL — ABNORMAL HIGH (ref 12.0–15.0)
Lymphocytes Relative: 40 %
Lymphs Abs: 3.2 10*3/uL (ref 0.7–4.0)
MCH: 32.5 pg (ref 26.0–34.0)
MCHC: 33.5 g/dL (ref 30.0–36.0)
MCV: 97 fL (ref 78.0–100.0)
MONO ABS: 0.9 10*3/uL (ref 0.1–1.0)
Monocytes Relative: 12 %
Neutro Abs: 3.6 10*3/uL (ref 1.7–7.7)
Neutrophils Relative %: 45 %
PLATELETS: 278 10*3/uL (ref 150–400)
RBC: 4.65 MIL/uL (ref 3.87–5.11)
RDW: 13.5 % (ref 11.5–15.5)
WBC: 8 10*3/uL (ref 4.0–10.5)

## 2017-01-29 LAB — COMPREHENSIVE METABOLIC PANEL
ALBUMIN: 3.9 g/dL (ref 3.5–5.0)
ALT: 24 U/L (ref 14–54)
AST: 28 U/L (ref 15–41)
Alkaline Phosphatase: 80 U/L (ref 38–126)
Anion gap: 11 (ref 5–15)
BUN: 22 mg/dL — AB (ref 6–20)
CHLORIDE: 102 mmol/L (ref 101–111)
CO2: 25 mmol/L (ref 22–32)
Calcium: 9.2 mg/dL (ref 8.9–10.3)
Creatinine, Ser: 0.75 mg/dL (ref 0.44–1.00)
GFR calc non Af Amer: >60 mL/min (ref >60)
GLUCOSE: 132 mg/dL — AB (ref 65–99)
Potassium: 3.9 mmol/L (ref 3.5–5.1)
SODIUM: 138 mmol/L (ref 135–145)
Total Bilirubin: 0.5 mg/dL (ref 0.3–1.2)
Total Protein: 7.5 g/dL (ref 6.5–8.1)

## 2017-01-30 ENCOUNTER — Ambulatory Visit (HOSPITAL_BASED_OUTPATIENT_CLINIC_OR_DEPARTMENT_OTHER)
Admission: RE | Admit: 2017-01-30 | Discharge: 2017-01-30 | Disposition: A | Discharge status: Home / Self Care | Payer: PPO | Source: Ambulatory Visit | Attending: General Surgery | Admitting: General Surgery

## 2017-01-30 ENCOUNTER — Encounter (HOSPITAL_BASED_OUTPATIENT_CLINIC_OR_DEPARTMENT_OTHER)
Admission: RE | Disposition: A | Discharge status: Home / Self Care | Payer: Self-pay | Source: Ambulatory Visit | Attending: General Surgery

## 2017-01-30 ENCOUNTER — Ambulatory Visit (HOSPITAL_BASED_OUTPATIENT_CLINIC_OR_DEPARTMENT_OTHER): Payer: PPO | Admitting: Anesthesiology

## 2017-01-30 ENCOUNTER — Encounter (HOSPITAL_BASED_OUTPATIENT_CLINIC_OR_DEPARTMENT_OTHER): Payer: Self-pay | Admitting: *Deleted

## 2017-01-30 DIAGNOSIS — K5790 Diverticulosis of intestine, part unspecified, without perforation or abscess without bleeding: Secondary | ICD-10-CM | POA: Diagnosis not present

## 2017-01-30 DIAGNOSIS — Z87891 Personal history of nicotine dependence: Secondary | ICD-10-CM | POA: Diagnosis not present

## 2017-01-30 DIAGNOSIS — Z85820 Personal history of malignant melanoma of skin: Secondary | ICD-10-CM | POA: Insufficient documentation

## 2017-01-30 DIAGNOSIS — Z6841 Body Mass Index (BMI) 40.0 and over, adult: Secondary | ICD-10-CM | POA: Insufficient documentation

## 2017-01-30 DIAGNOSIS — M1990 Unspecified osteoarthritis, unspecified site: Secondary | ICD-10-CM | POA: Diagnosis not present

## 2017-01-30 DIAGNOSIS — Z79899 Other long term (current) drug therapy: Secondary | ICD-10-CM | POA: Insufficient documentation

## 2017-01-30 DIAGNOSIS — K811 Chronic cholecystitis: Secondary | ICD-10-CM | POA: Insufficient documentation

## 2017-01-30 DIAGNOSIS — Z880 Allergy status to penicillin: Secondary | ICD-10-CM | POA: Diagnosis not present

## 2017-01-30 DIAGNOSIS — K828 Other specified diseases of gallbladder: Secondary | ICD-10-CM | POA: Insufficient documentation

## 2017-01-30 DIAGNOSIS — J4540 Moderate persistent asthma, uncomplicated: Secondary | ICD-10-CM | POA: Insufficient documentation

## 2017-01-30 DIAGNOSIS — Z86010 Personal history of colonic polyps: Secondary | ICD-10-CM | POA: Insufficient documentation

## 2017-01-30 DIAGNOSIS — Z91040 Latex allergy status: Secondary | ICD-10-CM | POA: Diagnosis not present

## 2017-01-30 DIAGNOSIS — Z87442 Personal history of urinary calculi: Secondary | ICD-10-CM | POA: Diagnosis not present

## 2017-01-30 DIAGNOSIS — K760 Fatty (change of) liver, not elsewhere classified: Secondary | ICD-10-CM | POA: Diagnosis not present

## 2017-01-30 DIAGNOSIS — Z888 Allergy status to other drugs, medicaments and biological substances status: Secondary | ICD-10-CM | POA: Insufficient documentation

## 2017-01-30 DIAGNOSIS — Z96653 Presence of artificial knee joint, bilateral: Secondary | ICD-10-CM | POA: Insufficient documentation

## 2017-01-30 DIAGNOSIS — J4520 Mild intermittent asthma, uncomplicated: Secondary | ICD-10-CM | POA: Diagnosis not present

## 2017-01-30 DIAGNOSIS — K219 Gastro-esophageal reflux disease without esophagitis: Secondary | ICD-10-CM | POA: Insufficient documentation

## 2017-01-30 HISTORY — DX: Presence of spectacles and contact lenses: Z97.3

## 2017-01-30 HISTORY — DX: Calculus of bile duct without cholangitis or cholecystitis without obstruction: K80.50

## 2017-01-30 HISTORY — DX: Other specified postprocedural states: Z98.890

## 2017-01-30 HISTORY — DX: Unspecified osteoarthritis, unspecified site: M19.90

## 2017-01-30 HISTORY — DX: Stress incontinence (female) (male): N39.3

## 2017-01-30 HISTORY — DX: Allergic rhinitis, unspecified: J30.9

## 2017-01-30 HISTORY — DX: Personal history of colon polyps, unspecified: Z86.0100

## 2017-01-30 HISTORY — DX: Moderate persistent asthma, uncomplicated: J45.40

## 2017-01-30 HISTORY — DX: Other urticaria: L50.8

## 2017-01-30 HISTORY — DX: Personal history of colonic polyps: Z86.010

## 2017-01-30 HISTORY — PX: CHOLECYSTECTOMY: SHX55

## 2017-01-30 HISTORY — DX: Personal history of malignant melanoma of skin: Z85.820

## 2017-01-30 HISTORY — DX: Diverticulosis of large intestine without perforation or abscess without bleeding: K57.30

## 2017-01-30 SURGERY — LAPAROSCOPIC CHOLECYSTECTOMY
Anesthesia: General | Site: Abdomen

## 2017-01-30 MED ORDER — MIDAZOLAM HCL 5 MG/5ML IJ SOLN
INTRAMUSCULAR | Status: DC | PRN
  Administered 2017-01-30: 2 mg via INTRAVENOUS

## 2017-01-30 MED ORDER — MIDAZOLAM HCL 2 MG/2ML IJ SOLN
INTRAMUSCULAR | Status: AC
  Filled 2017-01-30: qty 2

## 2017-01-30 MED ORDER — HEPARIN SODIUM (PORCINE) 5000 UNIT/ML IJ SOLN
5000.0000 [IU] | Freq: Once | INTRAMUSCULAR | Status: AC
  Administered 2017-01-30: 5000 [IU] via SUBCUTANEOUS
  Filled 2017-01-30: qty 1

## 2017-01-30 MED ORDER — CHLORHEXIDINE GLUCONATE CLOTH 2 % EX PADS
6.0000 | MEDICATED_PAD | Freq: Once | CUTANEOUS | Status: DC
  Filled 2017-01-30: qty 6

## 2017-01-30 MED ORDER — GABAPENTIN 300 MG PO CAPS
300.0000 mg | ORAL_CAPSULE | ORAL | Status: AC
  Administered 2017-01-30: 300 mg via ORAL
  Filled 2017-01-30: qty 1

## 2017-01-30 MED ORDER — FENTANYL CITRATE (PF) 100 MCG/2ML IJ SOLN
INTRAMUSCULAR | Status: DC | PRN
  Administered 2017-01-30: 50 ug via INTRAVENOUS
  Administered 2017-01-30: 150 ug via INTRAVENOUS
  Administered 2017-01-30: 50 ug via INTRAVENOUS

## 2017-01-30 MED ORDER — SUGAMMADEX SODIUM 200 MG/2ML IV SOLN
INTRAVENOUS | Status: DC | PRN
  Administered 2017-01-30: 400 mg via INTRAVENOUS

## 2017-01-30 MED ORDER — HEPARIN SODIUM (PORCINE) 5000 UNIT/ML IJ SOLN
INTRAMUSCULAR | Status: AC
  Filled 2017-01-30: qty 1

## 2017-01-30 MED ORDER — CELECOXIB 200 MG PO CAPS
ORAL_CAPSULE | ORAL | Status: AC
  Filled 2017-01-30: qty 2

## 2017-01-30 MED ORDER — ONDANSETRON HCL 4 MG/2ML IJ SOLN
INTRAMUSCULAR | Status: AC
  Filled 2017-01-30: qty 2

## 2017-01-30 MED ORDER — GABAPENTIN 300 MG PO CAPS
ORAL_CAPSULE | ORAL | Status: AC
  Filled 2017-01-30: qty 1

## 2017-01-30 MED ORDER — PROPOFOL 10 MG/ML IV BOLUS
INTRAVENOUS | Status: DC | PRN
  Administered 2017-01-30: 250 mg via INTRAVENOUS
  Administered 2017-01-30: 50 mg via INTRAVENOUS

## 2017-01-30 MED ORDER — LIDOCAINE 2% (20 MG/ML) 5 ML SYRINGE
INTRAMUSCULAR | Status: AC
  Filled 2017-01-30: qty 5

## 2017-01-30 MED ORDER — DEXAMETHASONE SODIUM PHOSPHATE 10 MG/ML IJ SOLN
INTRAMUSCULAR | Status: AC
  Filled 2017-01-30: qty 1

## 2017-01-30 MED ORDER — SUCCINYLCHOLINE CHLORIDE 200 MG/10ML IV SOSY
PREFILLED_SYRINGE | INTRAVENOUS | Status: AC
  Filled 2017-01-30: qty 10

## 2017-01-30 MED ORDER — SUGAMMADEX SODIUM 200 MG/2ML IV SOLN
INTRAVENOUS | Status: AC
  Filled 2017-01-30: qty 2

## 2017-01-30 MED ORDER — SODIUM CHLORIDE 0.9 % IR SOLN
Status: DC | PRN
  Administered 2017-01-30: 1000 mL

## 2017-01-30 MED ORDER — ONDANSETRON HCL 4 MG/2ML IJ SOLN
INTRAMUSCULAR | Status: DC | PRN
  Administered 2017-01-30: 4 mg via INTRAVENOUS

## 2017-01-30 MED ORDER — BUPIVACAINE HCL 0.5 % IJ SOLN
INTRAMUSCULAR | Status: DC | PRN
  Administered 2017-01-30: 20 mL

## 2017-01-30 MED ORDER — HYDROCODONE-ACETAMINOPHEN 5-325 MG PO TABS: 1.0000 | ORAL_TABLET | Freq: Four times a day (QID) | ORAL | 0 refills | Status: DC | PRN

## 2017-01-30 MED ORDER — KETOROLAC TROMETHAMINE 30 MG/ML IJ SOLN
INTRAMUSCULAR | Status: AC
  Filled 2017-01-30: qty 1

## 2017-01-30 MED ORDER — MEPERIDINE HCL 25 MG/ML IJ SOLN
6.2500 mg | INTRAMUSCULAR | Status: DC | PRN
  Filled 2017-01-30: qty 1

## 2017-01-30 MED ORDER — HYDROCODONE-ACETAMINOPHEN 5-325 MG PO TABS
ORAL_TABLET | ORAL | Status: AC
  Filled 2017-01-30: qty 1

## 2017-01-30 MED ORDER — CIPROFLOXACIN IN D5W 400 MG/200ML IV SOLN
400.0000 mg | INTRAVENOUS | Status: AC
  Administered 2017-01-30: 400 mg via INTRAVENOUS
  Filled 2017-01-30: qty 200

## 2017-01-30 MED ORDER — ROCURONIUM BROMIDE 50 MG/5ML IV SOSY
PREFILLED_SYRINGE | INTRAVENOUS | Status: AC
  Filled 2017-01-30: qty 5

## 2017-01-30 MED ORDER — METOCLOPRAMIDE HCL 5 MG/ML IJ SOLN
10.0000 mg | Freq: Once | INTRAMUSCULAR | Status: DC | PRN
  Filled 2017-01-30: qty 2

## 2017-01-30 MED ORDER — FENTANYL CITRATE (PF) 100 MCG/2ML IJ SOLN
INTRAMUSCULAR | Status: AC
  Filled 2017-01-30: qty 2

## 2017-01-30 MED ORDER — FENTANYL CITRATE (PF) 250 MCG/5ML IJ SOLN
INTRAMUSCULAR | Status: AC
  Filled 2017-01-30: qty 5

## 2017-01-30 MED ORDER — DEXAMETHASONE SODIUM PHOSPHATE 4 MG/ML IJ SOLN
INTRAMUSCULAR | Status: DC | PRN
  Administered 2017-01-30: 10 mg via INTRAVENOUS

## 2017-01-30 MED ORDER — SCOPOLAMINE 1 MG/3DAYS TD PT72
1.0000 | MEDICATED_PATCH | TRANSDERMAL | Status: DC
  Administered 2017-01-30: 1.5 mg via TRANSDERMAL
  Administered 2017-01-30: 1 via TRANSDERMAL
  Filled 2017-01-30: qty 1

## 2017-01-30 MED ORDER — ARTIFICIAL TEARS OPHTHALMIC OINT
TOPICAL_OINTMENT | OPHTHALMIC | Status: AC
  Filled 2017-01-30: qty 3.5

## 2017-01-30 MED ORDER — CIPROFLOXACIN IN D5W 400 MG/200ML IV SOLN
INTRAVENOUS | Status: AC
  Filled 2017-01-30: qty 200

## 2017-01-30 MED ORDER — ACETAMINOPHEN 500 MG PO TABS
ORAL_TABLET | ORAL | Status: AC
  Filled 2017-01-30: qty 2

## 2017-01-30 MED ORDER — HYDROCODONE-ACETAMINOPHEN 7.5-325 MG PO TABS
1.0000 | ORAL_TABLET | Freq: Once | ORAL | Status: DC | PRN
  Filled 2017-01-30: qty 1

## 2017-01-30 MED ORDER — IBUPROFEN 800 MG PO TABS: 800.0000 mg | ORAL_TABLET | Freq: Three times a day (TID) | ORAL | 0 refills | Status: DC | PRN

## 2017-01-30 MED ORDER — ACETAMINOPHEN 500 MG PO TABS
1000.0000 mg | ORAL_TABLET | ORAL | Status: AC
  Administered 2017-01-30: 1000 mg via ORAL
  Filled 2017-01-30: qty 2

## 2017-01-30 MED ORDER — ROCURONIUM BROMIDE 100 MG/10ML IV SOLN
INTRAVENOUS | Status: DC | PRN
  Administered 2017-01-30: 50 mg via INTRAVENOUS

## 2017-01-30 MED ORDER — SUCCINYLCHOLINE CHLORIDE 20 MG/ML IJ SOLN
INTRAMUSCULAR | Status: DC | PRN
  Administered 2017-01-30: 160 mg via INTRAVENOUS

## 2017-01-30 MED ORDER — SCOPOLAMINE 1 MG/3DAYS TD PT72
MEDICATED_PATCH | TRANSDERMAL | Status: AC
  Filled 2017-01-30: qty 1

## 2017-01-30 MED ORDER — LIDOCAINE HCL 1 % IJ SOLN
INTRAMUSCULAR | Status: DC | PRN
  Administered 2017-01-30: 60 mg via INTRADERMAL

## 2017-01-30 MED ORDER — FENTANYL CITRATE (PF) 100 MCG/2ML IJ SOLN
25.0000 ug | INTRAMUSCULAR | Status: DC | PRN
  Administered 2017-01-30 (×3): 25 ug via INTRAVENOUS
  Filled 2017-01-30: qty 1

## 2017-01-30 MED ORDER — HYDROCODONE-ACETAMINOPHEN 5-325 MG PO TABS
1.0000 | ORAL_TABLET | Freq: Once | ORAL | Status: AC
  Administered 2017-01-30: 1 via ORAL
  Filled 2017-01-30: qty 1

## 2017-01-30 MED ORDER — LACTATED RINGERS IV SOLN
INTRAVENOUS | Status: DC
  Administered 2017-01-30: 07:00:00 via INTRAVENOUS
  Filled 2017-01-30: qty 1000

## 2017-01-30 MED ORDER — CELECOXIB 400 MG PO CAPS
400.0000 mg | ORAL_CAPSULE | ORAL | Status: AC
  Administered 2017-01-30: 400 mg via ORAL
  Filled 2017-01-30: qty 1

## 2017-01-30 SURGICAL SUPPLY — 46 items
APPLIER CLIP ROT 10 11.4 M/L (STAPLE)
BLADE SURG 11 STRL SS (BLADE) ×2 IMPLANT
CABLE HIGH FREQUENCY MONO STRZ (ELECTRODE) ×2 IMPLANT
CATH CHOLANG 76X19 KUMAR (CATHETERS) IMPLANT
CHLORAPREP W/TINT 26ML (MISCELLANEOUS) ×2 IMPLANT
CLIP APPLIE ROT 10 11.4 M/L (STAPLE) IMPLANT
CLIP LIGATING HEM O LOK PURPLE (MISCELLANEOUS) IMPLANT
CLIP LIGATING HEMO LOK XL GOLD (MISCELLANEOUS) ×2 IMPLANT
COVER BACK TABLE 60X90IN (DRAPES) ×2 IMPLANT
COVER MAYO STAND STRL (DRAPES) ×2 IMPLANT
DECANTER SPIKE VIAL GLASS SM (MISCELLANEOUS) IMPLANT
DERMABOND ADVANCED (GAUZE/BANDAGES/DRESSINGS) ×1
DERMABOND ADVANCED .7 DNX12 (GAUZE/BANDAGES/DRESSINGS) ×1 IMPLANT
DEVICE PMI PUNCTURE CLOSURE (MISCELLANEOUS) IMPLANT
DRAIN CHANNEL 19F RND (DRAIN) IMPLANT
DRAPE C-ARM 42X72 X-RAY (DRAPES) IMPLANT
DRAPE LAPAROSCOPIC ABDOMINAL (DRAPES) ×2 IMPLANT
DRAPE UTILITY XL STRL (DRAPES) ×2 IMPLANT
ELECT REM PT RETURN 9FT ADLT (ELECTROSURGICAL) ×2
ELECTRODE REM PT RTRN 9FT ADLT (ELECTROSURGICAL) ×1 IMPLANT
EVACUATOR SILICONE 100CC (DRAIN) IMPLANT
GLOVE BIOGEL PI IND STRL 7.0 (GLOVE) ×1 IMPLANT
GLOVE BIOGEL PI INDICATOR 7.0 (GLOVE) ×1
GLOVE SURG SS PI 7.0 STRL IVOR (GLOVE) ×2 IMPLANT
GOWN STRL REUS W/TWL LRG LVL3 (GOWN DISPOSABLE) ×2 IMPLANT
HEMOSTAT SNOW SURGICEL 2X4 (HEMOSTASIS) IMPLANT
NEEDLE HYPO 22GX1.5 SAFETY (NEEDLE) ×2 IMPLANT
NS IRRIG 500ML POUR BTL (IV SOLUTION) ×2 IMPLANT
PACK BASIN DAY SURGERY FS (CUSTOM PROCEDURE TRAY) ×2 IMPLANT
POUCH RETRIEVAL ECOSAC 10 (ENDOMECHANICALS) ×1 IMPLANT
POUCH RETRIEVAL ECOSAC 10MM (ENDOMECHANICALS) ×1
SCISSORS LAP 5X35 DISP (ENDOMECHANICALS) IMPLANT
SET IRRIG TUBING LAPAROSCOPIC (IRRIGATION / IRRIGATOR) ×2 IMPLANT
SHEARS HARMONIC ACE PLUS 36CM (ENDOMECHANICALS) IMPLANT
SOLUTION ANTI FOG 6CC (MISCELLANEOUS) ×2 IMPLANT
STOPCOCK 4 WAY LG BORE MALE ST (IV SETS) IMPLANT
SUT ETHILON 2 0 PS N (SUTURE) IMPLANT
SUT MNCRL AB 4-0 PS2 18 (SUTURE) ×2 IMPLANT
SUT VICRYL 0 ENDOLOOP (SUTURE) IMPLANT
SUT VICRYL 0 UR6 27IN ABS (SUTURE) IMPLANT
SYR 20CC LL (SYRINGE) IMPLANT
SYR CONTROL 10ML LL (SYRINGE) ×2 IMPLANT
TOWEL OR 17X24 6PK STRL BLUE (TOWEL DISPOSABLE) ×4 IMPLANT
TROCAR BLADELESS OPT 5 100 (ENDOMECHANICALS) ×6 IMPLANT
TROCAR XCEL 12X100 BLDLESS (ENDOMECHANICALS) ×2 IMPLANT
TUBING INSUF HEATED (TUBING) ×2 IMPLANT

## 2017-01-30 NOTE — Anesthesia Postprocedure Evaluation (Signed)
Anesthesia Post Note  Patient: Sharon [name]  Procedure(s) Performed: Procedure(s) (LRB): LAPAROSCOPIC CHOLECYSTECTOMY (N/A)     Patient location during evaluation: PACU Anesthesia Type: General Level of consciousness: awake and alert and oriented Pain management: pain level controlled Vital Signs Assessment: post-procedure vital signs reviewed and stable Respiratory status: spontaneous breathing, nonlabored ventilation and respiratory function stable Cardiovascular status: blood pressure returned to baseline and stable Postop Assessment: no signs of nausea or vomiting Anesthetic complications: no    Last Vitals:  Vitals:   01/30/17 1001 01/30/17 1002  BP: (!) 193/85   Pulse: 70 74  Resp: 14 15  Temp:    SpO2: 98% 97%    Last Pain:  Vitals:   01/30/17 0945  TempSrc:   PainSc: 6                  Kalei Mckillop A.

## 2017-01-30 NOTE — Anesthesia Preprocedure Evaluation (Addendum)
Anesthesia Evaluation  Patient identified by MRN, date of birth, ID band Patient awake    Reviewed: Allergy & Precautions, NPO status , Patient's Chart, lab work & pertinent test results  Airway Mallampati: III  TM Distance: >3 FB Neck ROM: Full    Dental no notable dental hx. (+) Teeth Intact, Dental Advisory Given   Pulmonary asthma , former smoker,    Pulmonary exam normal breath sounds clear to auscultation       Cardiovascular negative cardio ROS Normal cardiovascular exam Rhythm:Regular Rate:Normal     Neuro/Psych  Headaches,  Neuromuscular disease negative psych ROS   GI/Hepatic Neg liver ROS, GERD  Medicated and Controlled,  Endo/Other  Morbid obesity  Renal/GU negative Renal ROS  negative genitourinary   Musculoskeletal  (+) Arthritis , Osteoarthritis,  Chronic low back pain with radiculopathy   Abdominal (+) + obese,   Peds  Hematology   Anesthesia Other Findings   Reproductive/Obstetrics                            Anesthesia Physical Anesthesia Plan  ASA: III  Anesthesia Plan: General   Post-op Pain Management:    Induction: Intravenous, Rapid sequence and Cricoid pressure planned  PONV Risk Score and Plan: 4 or greater and Ondansetron, Dexamethasone, Midazolam, Scopolamine patch - Pre-op and Propofol infusion  Airway Management Planned: Oral ETT  Additional Equipment:   Intra-op Plan:   Post-operative Plan: Extubation in OR  Informed Consent: I have reviewed the patients History and Physical, chart, labs and discussed the procedure including the risks, benefits and alternatives for the proposed anesthesia with the patient or authorized representative who has indicated his/her understanding and acceptance.   Dental advisory given  Plan Discussed with: CRNA, Anesthesiologist and Surgeon  Anesthesia Plan Comments:         Anesthesia Quick Evaluation

## 2017-01-30 NOTE — Transfer of Care (Signed)
Immediate Anesthesia Transfer of Care Note  Patient: Sharon [name]  Procedure(s) Performed: Procedure(s): LAPAROSCOPIC CHOLECYSTECTOMY (N/A)  Patient Location: PACU  Anesthesia Type:General  Level of Consciousness: awake, alert , oriented and patient cooperative  Airway & Oxygen Therapy: Patient Spontanous Breathing and Patient connected to nasal cannula oxygen  Post-op Assessment: Report given to RN and Post -op Vital signs reviewed and stable  Post vital signs: Reviewed and stable  Last Vitals:  Vitals:   01/30/17 0604  BP: 136/72  Pulse: 85  Resp: 18  Temp: 36.6 C  SpO2: 97%    Last Pain:  Vitals:   01/30/17 0604  TempSrc: Oral      Patients Stated Pain Goal: 8 (35/67/01 4103)  Complications: No apparent anesthesia complications

## 2017-01-30 NOTE — Op Note (Signed)
PATIENT:  [name]  61 y.o. female  PRE-OPERATIVE DIAGNOSIS:  BILIARY DYSKINESIA  POST-OPERATIVE DIAGNOSIS:  BILIARY DYSKINESIA  PROCEDURE:  Procedure(s): LAPAROSCOPIC CHOLECYSTECTOMY   SURGEON:  Surgeon(s): Kinsinger, Arta Bruce, MD  ASSISTANT: none  ANESTHESIA:   local and general  Indications for procedure: Sharon [name] is a 61 y.o. female with symptoms of Abdominal pain and Nausea and vomiting consistent with gallbladder disease, confirmed by overactivity on HIDA scan.  Description of procedure: The patient was brought into the operative suite, placed supine. Anesthesia was administered with endotracheal tube. Patient was strapped in place and foot board was secured. All pressure points were offloaded by foam padding. The patient was prepped and draped in the usual sterile fashion.  A small incision was made to the right of the umbilicus. A 24mm trocar was inserted into the peritoneal cavity with optical entry. Pneumoperitoneum was applied with high flow low pressure. 2 72mm trocars were placed in the RUQ. A 12mm trocar was placed in the subxiphoid space. All trocars sites were first anesthesized with 0.25% marcaine with epinephrine in the subcutaneous and preperitoneal layers. Next the patient was placed in reverse trendelenberg. The gallbladder was mildly dilated  The gallbladder was retracted cephalad and lateral. The peritoneum was reflected off the infundibulum working lateral to medial. The cystic duct and cystic artery were identified and further dissection revealed a critical view. The cystic duct and cystic artery were doubly clipped and ligated.   The gallbladder was removed off the liver bed with cautery. The Gallbladder was placed in a specimen bag. The gallbladder fossa was irrigated and hemostasis was applied with cautery. The gallbladder was removed via the 2mm trocar. No dilation was required for removal, therefore no fascial closure was performed.  Pneumoperitoneum was removed, all trocar were removed. All incisions were closed with 4-0 monocryl subcuticular stitch. The patient woke from anesthesia and was brought to PACU in stable condition. All counts were correct  Findings: mildly inflamed/dilated gallbladder  Specimen: gallbladder  Blood loss: Total I/O In: 500 [I.V.:500] Out: -  ml  Local anesthesia: 20 ml 9.4% marcaine  Complications: none  PLAN OF CARE: Discharge to home after PACU  PATIENT DISPOSITION:  PACU - hemodynamically stable.  Gurney Maxin, M.D. General, Bariatric, & Minimally Invasive Surgery [facility]

## 2017-01-30 NOTE — H&P (Signed)
Sharon [name] is an 61 y.o. female.   Chief Complaint: abdominal pain HPI: 61 yo female with epigastric pain intermittently especially with certain foods. She has made minor dietary changes with minimal improvement. She has some nausea and no vomiting.  Past Medical History:  Diagnosis Date  . Biliary colic   . Carpal tunnel syndrome    bilateral  . Chronic allergic rhinitis   . Chronic urticaria   . Complication of anesthesia    confusion for a couple of days after waking after having multiple knee surgery's, does well with spinal  . Degenerative disk disease   . Diverticulosis of colon   . Fatty liver   . Fibrocystic breast disease 1996  . GERD (gastroesophageal reflux disease)   . History of colon polyps    HYPERPLASTIC 09-14-2015  . History of kidney stones    passed on own  . History of melanoma excision    left thigh ,inside 2009--- per pt also has had severeal excision of office for almost melanoma  . Migraine    since age 45 not as frequent now  . Moderate persistent asthma, uncomplicated    followed by dr gallagher (cone allergy and asthma)  . OA (osteoarthritis)    all joints  . SUI (stress urinary incontinence, female)   . Wears glasses     Past Surgical History:  Procedure Laterality Date  . APPENDECTOMY  1973  . BREAST EXCISIONAL BIOPSY Right 1994;  10-01-2009   2011 per path mild ductal hyperplasia/ fibrocystic breast  . COLONOSCOPY N/A 09/14/2015   Procedure: COLONOSCOPY;  Surgeon: Daneil Dolin, MD;  Location: [facility];  Service: Endoscopy;  Laterality: N/A;  930 - moved to 9:15 - office to notify  . ESOPHAGOGASTRODUODENOSCOPY (EGD) WITH PROPOFOL N/A 11/29/2016   Procedure: ESOPHAGOGASTRODUODENOSCOPY (EGD) WITH PROPOFOL;  Surgeon: Doran Stabler, MD;  Location: [facility];  Service: Gastroenterology;  Laterality: N/A;  . KNEE ARTHROSCOPY W/ MENISCECTOMY Bilateral left 12-19-2006;  right 01-26-2010  . TONSILLECTOMY  1959  . TOTAL KNEE  ARTHROPLASTY Left 03/10/2007  . TOTAL KNEE ARTHROPLASTY  06/17/2011   Procedure: TOTAL KNEE ARTHROPLASTY;  Surgeon: Arther Abbott, MD;  Location: [facility];  Service: Orthopedics;  Laterality: Right;  [place]  . TOTAL KNEE REVISION Left 07/08/2010  (spinal anesthesia)  . TUBAL LIGATION Bilateral YRS AGO  . VAGINAL HYSTERECTOMY  1988    Family History  Problem Relation Age of Onset  . Hypertension Father   . Lung cancer Father 73       lung cancer  . Lung disease Unknown        family history   . Allergic rhinitis Daughter   . Food Allergy Grandchild   . Anesthesia problems Neg Hx   . Hypotension Neg Hx   . Malignant hyperthermia Neg Hx   . Pseudochol deficiency Neg Hx   . Colon cancer Neg Hx   . Angioedema Neg Hx   . Asthma Neg Hx   . Eczema Neg Hx   . Immunodeficiency Neg Hx   . Urticaria Neg Hx    Social History:  reports that she quit smoking about 10 years ago. Her smoking use included Cigarettes. She started smoking about 44 years ago. She has a 25.00 pack-year smoking history. She has never used smokeless tobacco. She reports that she does not drink alcohol or use drugs.  Allergies:  Allergies  Allergen Reactions  . 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 Adhesive [Tape] Hives and Dermatitis    Can tolerate tegaderm  . Augmentin [Amoxicillin-Pot Clavulanate] Diarrhea and Nausea Only  . Latex Hives  . Neomycin-Bacitracin Zn-Polymyx Dermatitis    NEOSPORIN    Medications Prior to Admission  Medication Sig Dispense Refill  . albuterol (PROVENTIL HFA;VENTOLIN HFA) 108 (90 Base) MCG/ACT inhaler Inhale into the lungs every 6 (six) hours as needed for wheezing or shortness of breath.    Marland Kitchen albuterol (PROVENTIL) (2.5 MG/3ML) 0.083% nebulizer solution Use one vial via neb q 4 hours prn wheezing 25 vial 2  . ALPRAZolam (XANAX) 0.5 MG tablet TAKE ONE TABLET BY MOUTH TWICE DAILY AS NEEDED (  MAY  FILL  MONTHLY) 30 tablet 2  . Budesonide  (RHINOCORT ALLERGY NA) Place 2 sprays into the nose every evening.     . Calcium Carbonate-Vitamin D (CALCIUM + D) 600-200 MG-UNIT TABS Take 1 tablet by mouth 2 (two) times daily.     . fexofenadine (ALLEGRA ALLERGY) 180 MG tablet Take 180 mg by mouth every morning.     . fish oil-omega-3 fatty acids 1000 MG capsule Take 1 g by mouth at bedtime.     . fluticasone furoate-vilanterol (BREO ELLIPTA) 200-25 MCG/INH AEPB Inhale 1 puff into the lungs daily. (Patient taking differently: Inhale 1 puff into the lungs every evening. ) 28 each 3  . furosemide (LASIX) 40 MG tablet Take 1.5 tablets by mouth each morning. (Patient taking differently: Take 40 mg by mouth daily. Take 1.5 tablets by mouth each morning.) 45 tablet 3  . HYDROcodone-acetaminophen (NORCO) 10-325 MG tablet Take 1 tablet by mouth every 6 (six) hours as needed. (Patient taking differently: Take 1 tablet by mouth every 6 (six) hours as needed for moderate pain or severe pain. ) 100 tablet 0  . KLOR-CON M10 10 MEQ tablet TAKE ONE TABLET BY MOUTH TWICE DAILY 180 tablet 1  . methocarbamol (ROBAXIN) 500 MG tablet TAKE ONE TABLET BY MOUTH EVERY 6 HOURS AS NEEDED FOR  BACK  PAIN 60 tablet 2  . Multiple Vitamin (MULITIVITAMIN WITH MINERALS) TABS Take 1 tablet by mouth at bedtime.    . nabumetone (RELAFEN) 750 MG tablet TAKE 1 TABLET BY MOUTH TWICE DAILY AS NEEDED FOR PAIN (Patient taking differently: TAKE 1 TABLET BY MOUTH TWICE DAILY  FOR PAIN) 60 tablet 2  . ondansetron (ZOFRAN-ODT) 8 MG disintegrating tablet DISSOLVE 1 TABLET IN MOUTH EVERY 8 HOURS AS NEEDED FOR NAUSEA OR VOMITING 30 tablet 0  . pantoprazole (PROTONIX) 40 MG tablet TAKE ONE TABLET BY MOUTH ONCE DAILY FOR REFLUX (Patient taking differently: TAKE ONE TABLET BY MOUTH ONCE DAILY FOR REFLUX-- TAKES IN PM) 30 tablet 5  . Tiotropium Bromide Monohydrate (SPIRIVA RESPIMAT) 1.25 MCG/ACT AERS Inhale 2 puffs into the lungs daily. (Patient taking differently: Inhale 2 puffs into the lungs every  morning. ) 4 g 3  . EPINEPHrine 0.3 mg/0.3 mL IJ SOAJ injection       Results for orders placed or performed during the hospital encounter of 01/29/17 (from the past 48 hour(s))  Comprehensive metabolic panel     Status: Abnormal   Collection Time: 01/29/17 10:15 AM  Result Value Ref Range   Sodium 138 135 - 145 mmol/L   Potassium 3.9 3.5 - 5.1 mmol/L   Chloride 102 101 - 111 mmol/L   CO2 25 22 - 32 mmol/L   Glucose, Bld 132 (H) 65 - 99 mg/dL   BUN 22 (H) 6 - 20 mg/dL  Creatinine, Ser 0.75 0.44 - 1.00 mg/dL   Calcium 9.2 8.9 - 10.3 mg/dL   Total Protein 7.5 6.5 - 8.1 g/dL   Albumin 3.9 3.5 - 5.0 g/dL   AST 28 15 - 41 U/L   ALT 24 14 - 54 U/L   Alkaline Phosphatase 80 38 - 126 U/L   Total Bilirubin 0.5 0.3 - 1.2 mg/dL   GFR calc non Af Amer >60 >60 mL/min   GFR calc Af Amer >60 >60 mL/min    Comment: (NOTE) The eGFR has been calculated using the CKD EPI equation. This calculation has not been validated in all clinical situations. eGFR's persistently <60 mL/min signify possible Chronic Kidney Disease.    Anion gap 11 5 - 15  CBC with Differential/Platelet     Status: Abnormal   Collection Time: 01/29/17 10:15 AM  Result Value Ref Range   WBC 8.0 4.0 - 10.5 K/uL   RBC 4.65 3.87 - 5.11 MIL/uL   Hemoglobin 15.1 (H) 12.0 - 15.0 g/dL   HCT 45.1 36.0 - 46.0 %   MCV 97.0 78.0 - 100.0 fL   MCH 32.5 26.0 - 34.0 pg   MCHC 33.5 30.0 - 36.0 g/dL   RDW 13.5 11.5 - 15.5 %   Platelets 278 150 - 400 K/uL   Neutrophils Relative % 45 %   Neutro Abs 3.6 1.7 - 7.7 K/uL   Lymphocytes Relative 40 %   Lymphs Abs 3.2 0.7 - 4.0 K/uL   Monocytes Relative 12 %   Monocytes Absolute 0.9 0.1 - 1.0 K/uL   Eosinophils Relative 3 %   Eosinophils Absolute 0.3 0.0 - 0.7 K/uL   Basophils Relative 0 %   Basophils Absolute 0.0 0.0 - 0.1 K/uL   No results found.  Review of Systems  Constitutional: Negative for chills and fever.  HENT: Negative for hearing loss.   Eyes: Negative for blurred vision  and double vision.  Respiratory: Negative for cough and hemoptysis.   Cardiovascular: Negative for chest pain and palpitations.  Gastrointestinal: Positive for abdominal pain and nausea. Negative for vomiting.  Genitourinary: Negative for dysuria and urgency.  Musculoskeletal: Negative for myalgias and neck pain.  Skin: Negative for itching and rash.  Neurological: Negative for dizziness, tingling and headaches.  Endo/Heme/Allergies: Does not bruise/bleed easily.  Psychiatric/Behavioral: Negative for depression and suicidal ideas.    Blood pressure 136/72, pulse 85, temperature 97.9 F (36.6 C), temperature source Oral, resp. rate 18, height 5' (1.524 m), weight 119.5 kg (263 lb 8 oz), SpO2 97 %. Physical Exam  Vitals reviewed. Constitutional: She is oriented to person, place, and time. She appears well-developed and well-nourished.  HENT:  Head: Normocephalic and atraumatic.  Eyes: Pupils are equal, round, and reactive to light. Conjunctivae and EOM are normal.  Neck: Normal range of motion. Neck supple.  Cardiovascular: Normal rate and regular rhythm.   Respiratory: Effort normal and breath sounds normal.  GI: Soft. Bowel sounds are normal. She exhibits no distension. There is tenderness in the right upper quadrant.  Musculoskeletal: Normal range of motion.  Neurological: She is alert and oriented to person, place, and time.  Skin: Skin is warm and dry.  Psychiatric: She has a normal mood and affect. Her behavior is normal.     Assessment/Plan 61 yo female with chronic calculous cholecystitis -lap chole -ERAS protocol -planned outpatient procedure  Mickeal Skinner, MD 01/30/2017, 8:21 AM

## 2017-01-30 NOTE — Anesthesia Procedure Notes (Addendum)
Procedure Name: Intubation Date/Time: 01/30/2017 8:29 AM Performed by: Wanita Chamberlain Pre-anesthesia Checklist: Patient identified, Emergency Drugs available, Suction available, Patient being monitored and Timeout performed Patient Re-evaluated:Patient Re-evaluated prior to induction Oxygen Delivery Method: Circle system utilized Preoxygenation: Pre-oxygenation with 100% oxygen Induction Type: IV induction Ventilation: Mask ventilation without difficulty Laryngoscope Size: Mac and 3 Grade View: Grade II Tube size: 7.0 mm Number of attempts: 1 Airway Equipment and Method: Stylet Placement Confirmation: ETT inserted through vocal cords under direct vision,  positive ETCO2 and breath sounds checked- equal and bilateral Secured at: 20 cm Tube secured with: Tape Dental Injury: Teeth and Oropharynx as per pre-operative assessment  Comments: A lot of redundant soft tissue in airway. V cords were viewed.

## 2017-01-30 NOTE — Discharge Instructions (Signed)

## 2017-01-31 ENCOUNTER — Encounter (HOSPITAL_BASED_OUTPATIENT_CLINIC_OR_DEPARTMENT_OTHER): Payer: Self-pay | Admitting: General Surgery

## 2017-02-21 ENCOUNTER — Other Ambulatory Visit: Payer: Self-pay | Admitting: Family Medicine

## 2017-02-25 ENCOUNTER — Other Ambulatory Visit: Payer: Self-pay | Admitting: *Deleted

## 2017-02-25 ENCOUNTER — Other Ambulatory Visit: Payer: Self-pay | Admitting: Nurse Practitioner

## 2017-02-25 ENCOUNTER — Encounter: Payer: Self-pay | Admitting: Nurse Practitioner

## 2017-02-26 ENCOUNTER — Other Ambulatory Visit: Payer: Self-pay | Admitting: Nurse Practitioner

## 2017-02-26 MED ORDER — HYDROCODONE-ACETAMINOPHEN 5-325 MG PO TABS: 1.0000 | ORAL_TABLET | Freq: Four times a day (QID) | ORAL | 0 refills | Status: DC | PRN

## 2017-02-26 MED ORDER — ALBUTEROL SULFATE HFA 108 (90 BASE) MCG/ACT IN AERS: 2.0000 | INHALATION_SPRAY | RESPIRATORY_TRACT | 5 refills | Status: DC | PRN

## 2017-03-06 ENCOUNTER — Encounter: Payer: Self-pay | Admitting: Allergy & Immunology

## 2017-03-06 ENCOUNTER — Ambulatory Visit (INDEPENDENT_AMBULATORY_CARE_PROVIDER_SITE_OTHER): Payer: PPO | Admitting: Allergy & Immunology

## 2017-03-06 VITALS — BP 144/84 | HR 78 | Temp 98.4°F | Resp 20

## 2017-03-06 DIAGNOSIS — J3089 Other allergic rhinitis: Secondary | ICD-10-CM

## 2017-03-06 DIAGNOSIS — T7800XD Anaphylactic reaction due to unspecified food, subsequent encounter: Secondary | ICD-10-CM

## 2017-03-06 DIAGNOSIS — J4540 Moderate persistent asthma, uncomplicated: Secondary | ICD-10-CM | POA: Diagnosis not present

## 2017-03-06 DIAGNOSIS — J302 Other seasonal allergic rhinitis: Secondary | ICD-10-CM | POA: Diagnosis not present

## 2017-03-06 DIAGNOSIS — K219 Gastro-esophageal reflux disease without esophagitis: Secondary | ICD-10-CM

## 2017-03-06 NOTE — Progress Notes (Signed)
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 - EpiPen up to date.  2. GERD - Continue with Protonix daily.   3. Chronic allergic rhinitis - Continue with Rhinocort two sprays per nostril daily. - Continue with Allegra daily.  4. Moderate persistent asthma, uncomplicated (failed Xolair) - Lung testing looked great today. - Daily controller medication(s): NONE - Rescue medications: ProAir 4 puffs every 4-6 hours as needed or albuterol nebulizer one vial puffs every 4-6 hours as needed - Asthma control goals:  * Full participation in all desired activities (may need albuterol before activity) * Albuterol use two time or less a week on average (not counting use with activity) * Cough interfering with sleep two time or less a month * Oral steroids no more than once a year * No hospitalizations  5. Return in about 3 months (around 06/06/2017).  Subjective:   Sharon [name] is a 61 y.o. female presenting today for follow up of  Chief Complaint  Patient presents with  . Asthma    Sharon [name] has a history of the following: Patient Active Problem List   Diagnosis Date Noted  . Moderate persistent asthma, uncomplicated 32/99/2426  . Chronic nonseasonal allergic rhinitis due to fungal spores 12/02/2016  . Chronic urticaria 12/02/2016  . Anaphylactic shock due to adverse food reaction 12/02/2016  . History of colonic polyps   . Diverticulosis of colon without  hemorrhage   . Asthma, mild intermittent 08/30/2015  . Encounter for screening colonoscopy 08/25/2015  . Wheezing 08/08/2015  . Gastroesophageal reflux disease 08/08/2015  . Morbid obesity ([place]) 07/14/2013  . Status post right knee replacement 06/17/2013  . Status post left knee replacement 06/17/2013  . DDD (degenerative disc disease), lumbosacral 10/06/2012  . Hip pain 10/06/2012  . Radicular leg pain 10/06/2012  . Bursitis of hip, right 06/25/2012  . Trochanteric bursitis of both hips 09/11/2011  . S/P total knee replacement 07/03/2011  . Knee pain 03/20/2011  . OA (osteoarthritis) of knee 12/12/2010  . Mononeuritis of leg 10/24/2010  . Chronic pain 10/24/2010  . OTHER POSTSURGICAL STATUS OTHER 07/24/2010  . STRESS FRACTURE, TIBIA 04/11/2010  . TENOSYNOVITIS OF FOOT AND ANKLE 02/15/2010  . LOOSE BODY-KNEE 01/10/2010  . DERANGEMENT MENISCUS 01/01/2010  . [place] HEAD 01/01/2010  . SHOULDER PAIN 04/19/2009  . IMPINGEMENT SYNDROME 04/19/2009  . HERNIATED LUMBOSACRAL DISC 09/01/2007  . UNSPECIFIED NEURALGIA NEURITIS AND RADICULITIS 07/09/2007  . TOTAL KNEE FOLLOW-UP 06/25/2007  . OSTEOARTHROSIS, LOCAL, PRIMARY, LOWER LEG 03/24/2007  . Osteoarthrosis, unspecified whether generalized or localized, lower leg 03/09/2007  . KNEE PAIN 02/02/2007  . TEAR MEDIAL MENISCUS 02/02/2007    History obtained from: chart review and patient.  [facility] Provider is [name], MD.     Sharon [name] is a 61 y.o. female presenting for a follow up visit. Sharon [name] was last seen in July 2018. At that time, her asthma was not under good control. We looked into starting Fasenra as a means of controlling her eosinophilic  asthma. We continued her on both Breo and Spiriva. For her allergies, we continue with Rhinocort and Allegra daily. We also continued her Protonix 40 mg daily for her reflux.  Since the last visit, she has been feeling really well. She actually made the  decision to get rid of the cat in her home, which has improved her symptoms markedly. She weaned herself off of both of her controller medications prior to her gallbladder removal on September 6. Since that time, she has had minimal breakthrough symptoms. She has been using albuterol maybe 1-2 times per week at the most. Most her symptoms at this point are secondary to increased exertion. She denies nighttime coughing as well as daytime coughing or wheezing. He has had no prednisone burst since the last visit. Her ACT score is 22 today, indicating excellent asthma control.   Her allergic rhinitis continues to be well controlled with Allegra and Rhinocort. She remains on Protonix for her reflux. She continues to avoid laminate pork due to a history of urticaria. She has recovered well from her gallbladder surgery, which was laparoscopic. 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 She has had no recent travels.   Otherwise, there have been no changes to her past medical history, surgical history, family history, or social history.    Review of Systems: a 14-point review of systems is pertinent for what is mentioned in HPI.  Otherwise, all other systems were negative. Constitutional: negative other than that listed in the HPI Eyes: negative other than that listed in the HPI Ears, nose, mouth, throat, and face: negative other than that listed in the HPI Respiratory: negative other than that listed in the HPI Cardiovascular: negative other than that listed in the HPI Gastrointestinal: negative other than that listed in the HPI Genitourinary: negative other than that listed in the HPI Integument: negative other than that listed in the HPI Hematologic: negative other than that listed in the HPI Musculoskeletal: negative other than that listed in the HPI Neurological: negative other than that listed in the HPI Allergy/Immunologic: negative other than that  listed in the HPI    Objective:   Blood pressure (!) 144/84, pulse 78, temperature 98.4 F (36.9 C), resp. rate 20, SpO2 95 %. There is no height or weight on file to calculate BMI.   Physical Exam:  General: Alert, interactive, in no acute distress. Pleasant obese female.  Eyes: No conjunctival injection bilaterally, no discharge on the right, no discharge on the left and no Horner-Trantas dots present. PERRL bilaterally. EOMI without pain. No photophobia.  Ears: Right TM pearly gray with normal light reflex, Left TM pearly gray with normal light reflex, Right TM intact without perforation and Left TM intact without perforation.  Nose/Throat: External nose within normal limits and septum midline. Turbinates edematous and pale with clear discharge. Posterior oropharynx mildly erythematous without cobblestoning in the posterior oropharynx. Tonsils 2+ without exudates.  Tongue without thrush. Adenopathy: shoddy bilateral anterior cervical lymphadenopathy Lungs: Clear to auscultation without wheezing, rhonchi or rales. No increased work of breathing. CV: Normal S1/S2. No murmurs. Capillary refill <2 seconds.  Skin: Warm and dry, without lesions or rashes. Neuro:   Grossly intact. No focal deficits appreciated. Responsive to questions.  Diagnostic studies: none  Spirometry: results abnormal (FEV1: 1.60/75%, FVC: 2.00/72%, FEV1/FVC: 80%).    Spirometry consistent with normal pattern. Compared to her last visit, her FEV1 is decreased from 1.82 L to 1.60 L and  her FVC is decreased from 2.06 L to 2.00 L.    Review of last two years of spirometric findings    Allergy Studies: none     Salvatore Marvel, MD [facility]

## 2017-03-06 NOTE — Patient Instructions (Addendum)
1. 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 - EpiPen up to date.  2. GERD - Continue with Protonix daily.   3. Chronic allergic rhinitis - Continue with Rhinocort two sprays per nostril daily. - Continue with Allegra daily.  4. Moderate persistent asthma, uncomplicated (failed Xolair) - Lung testing looked great today. - Daily controller medication(s): NONE - Rescue medications: ProAir 4 puffs every 4-6 hours as needed or albuterol nebulizer one vial puffs every 4-6 hours as needed - Asthma control goals:  * Full participation in all desired activities (may need albuterol before activity) * Albuterol use two time or less a week on average (not counting use with activity) * Cough interfering with sleep two time or less a month * Oral steroids no more than once a year * No hospitalizations  5. Return in about 3 months (around 06/06/2017).   Please inform us of any Emergency Department visits, hospitalizations, or changes in symptoms. Call us before going to the ED for breathing or allergy symptoms since we might be able to fit you in for a sick visit. Feel free to contact us anytime with any questions, problems, or concerns.  It was a pleasure to see you again today! Enjoy the upcoming fall season!  Websites that have reliable patient information: 1. American Academy of Asthma, Allergy, and Immunology: www.aaaai.org 2. Food Allergy Research and Education (FARE): foodallergy.org 3. Mothers of Asthmatics: http://www.asthmacommunitynetwork.org 4. American College of Allergy, Asthma, and Immunology: www.acaai.org   Election Day is coming up on Tuesday, November 6th! Make your voice heard! Register to vote at [email or web address]!     The last day to register is October 12th!   You can check the status of your registration by looking it up at [email or web address].  [place]:  [email or web address]  [facility] of Elections:  [email or web address]

## 2017-03-20 ENCOUNTER — Encounter: Payer: Self-pay | Admitting: Nurse Practitioner

## 2017-03-21 ENCOUNTER — Other Ambulatory Visit: Payer: Self-pay | Admitting: Nurse Practitioner

## 2017-03-21 MED ORDER — HYDROCODONE-ACETAMINOPHEN 10-325 MG PO TABS
1.0000 | ORAL_TABLET | Freq: Four times a day (QID) | ORAL | 0 refills | Status: DC | PRN
Start: 2017-03-21 — End: 2017-07-15

## 2017-03-21 NOTE — Progress Notes (Signed)
NCCSR reviewed. 

## 2017-04-01 ENCOUNTER — Ambulatory Visit: Payer: PPO | Admitting: Allergy & Immunology

## 2017-04-03 ENCOUNTER — Encounter: Payer: Self-pay | Admitting: Nurse Practitioner

## 2017-04-09 ENCOUNTER — Other Ambulatory Visit: Payer: Self-pay | Admitting: Family Medicine

## 2017-05-24 ENCOUNTER — Other Ambulatory Visit: Payer: Self-pay | Admitting: Nurse Practitioner

## 2017-05-26 ENCOUNTER — Other Ambulatory Visit: Payer: Self-pay | Admitting: *Deleted

## 2017-05-29 ENCOUNTER — Other Ambulatory Visit: Payer: Self-pay | Admitting: *Deleted

## 2017-05-30 MED ORDER — NABUMETONE 750 MG PO TABS: 750.0000 mg | ORAL_TABLET | Freq: Two times a day (BID) | ORAL | 2 refills | Status: DC | PRN

## 2017-06-04 ENCOUNTER — Ambulatory Visit (INDEPENDENT_AMBULATORY_CARE_PROVIDER_SITE_OTHER): Payer: PPO | Admitting: Nurse Practitioner

## 2017-06-04 ENCOUNTER — Encounter: Payer: Self-pay | Admitting: Nurse Practitioner

## 2017-06-04 VITALS — BP 134/80 | Temp 98.4°F | Ht 60.0 in | Wt 273.0 lb

## 2017-06-04 DIAGNOSIS — J329 Chronic sinusitis, unspecified: Secondary | ICD-10-CM | POA: Diagnosis not present

## 2017-06-04 DIAGNOSIS — R609 Edema, unspecified: Secondary | ICD-10-CM | POA: Diagnosis not present

## 2017-06-04 DIAGNOSIS — K219 Gastro-esophageal reflux disease without esophagitis: Secondary | ICD-10-CM | POA: Diagnosis not present

## 2017-06-04 DIAGNOSIS — G894 Chronic pain syndrome: Secondary | ICD-10-CM | POA: Diagnosis not present

## 2017-06-04 MED ORDER — CEFDINIR 300 MG PO CAPS
300.0000 mg | ORAL_CAPSULE | Freq: Two times a day (BID) | ORAL | 0 refills | Status: DC
Start: 2017-06-04 — End: 2017-10-02

## 2017-06-04 NOTE — Progress Notes (Signed)
Subjective:  Presents for c/o cough and congestion x 10 d, better x 2 d. Producing green mucus. Facial area headache. Sore throat. Chills. Right ear pressure. Body aches and fatigue. Continues follow up with asthma specialist. GERD basically resolved after cholecystectomy. Plans to cut back and try to stop Protonix. Takes hydrocodone only with severe pain. Edema well controlled with Lasix.   Objective:   BP 134/80   Temp 98.4 F (36.9 C) (Oral)   Ht 5' (1.524 m)   Wt 273 lb (123.8 kg)   BMI 53.32 kg/m  NAD. Alert, oriented. TMs clear effusion, no erythema. Pharynx injected with green PND noted. Neck supple with mild anterior adenopathy. Lungs clear. Heart RRR. LE: no edema.   Assessment:   Problem List Items Addressed This Visit      Digestive   Gastroesophageal reflux disease     Other   Chronic pain    Other Visit Diagnoses    Rhinosinusitis    -  Primary   Relevant Medications   cefdinir (OMNICEF) 300 MG capsule   Peripheral edema           Plan:   Meds ordered this encounter  Medications  . cefdinir (OMNICEF) 300 MG capsule    Sig: Take 1 capsule (300 mg total) by mouth 2 (two) times daily.    Dispense:  20 capsule    Refill:  0    Order Specific Question:   Supervising Provider    Answer:   [name] [[id number]]   OTC meds as directed for cough and congestion. Call back if worsens or persists. Activity as tolerated. Start weaning off Protonix if tolerated.  Return in about 4 months (around 10/02/2017) for recheck.

## 2017-06-05 ENCOUNTER — Ambulatory Visit: Payer: PPO | Admitting: Allergy & Immunology

## 2017-07-01 ENCOUNTER — Ambulatory Visit (HOSPITAL_COMMUNITY)
Admission: RE | Admit: 2017-07-01 | Discharge: 2017-07-01 | Disposition: A | Discharge status: Home / Self Care | Payer: PPO | Source: Ambulatory Visit | Attending: Family Medicine | Admitting: Family Medicine

## 2017-07-01 ENCOUNTER — Ambulatory Visit (INDEPENDENT_AMBULATORY_CARE_PROVIDER_SITE_OTHER): Payer: PPO | Admitting: Family Medicine

## 2017-07-01 ENCOUNTER — Other Ambulatory Visit (HOSPITAL_COMMUNITY)
Admission: RE | Admit: 2017-07-01 | Discharge: 2017-07-01 | Disposition: A | Discharge status: Home / Self Care | Payer: PPO | Source: Ambulatory Visit | Attending: Family Medicine | Admitting: Family Medicine

## 2017-07-01 ENCOUNTER — Encounter: Payer: Self-pay | Admitting: Family Medicine

## 2017-07-01 VITALS — BP 128/66 | Temp 98.4°F | Ht 60.0 in | Wt 277.4 lb

## 2017-07-01 DIAGNOSIS — M79661 Pain in right lower leg: Secondary | ICD-10-CM | POA: Diagnosis not present

## 2017-07-01 DIAGNOSIS — Z96652 Presence of left artificial knee joint: Secondary | ICD-10-CM | POA: Diagnosis not present

## 2017-07-01 DIAGNOSIS — R600 Localized edema: Secondary | ICD-10-CM | POA: Diagnosis not present

## 2017-07-01 DIAGNOSIS — R609 Edema, unspecified: Secondary | ICD-10-CM | POA: Insufficient documentation

## 2017-07-01 DIAGNOSIS — M7989 Other specified soft tissue disorders: Secondary | ICD-10-CM | POA: Insufficient documentation

## 2017-07-01 DIAGNOSIS — M25562 Pain in left knee: Secondary | ICD-10-CM

## 2017-07-01 DIAGNOSIS — S8011XA Contusion of right lower leg, initial encounter: Secondary | ICD-10-CM | POA: Diagnosis not present

## 2017-07-01 DIAGNOSIS — W03XXXA Other fall on same level due to collision with another person, initial encounter: Secondary | ICD-10-CM | POA: Diagnosis not present

## 2017-07-01 DIAGNOSIS — S8991XA Unspecified injury of right lower leg, initial encounter: Secondary | ICD-10-CM | POA: Diagnosis not present

## 2017-07-01 LAB — D-DIMER, QUANTITATIVE: D-Dimer, Quant: 0.42 ug/mL-FEU (ref 0.00–0.50)
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
## 2017-07-09 ENCOUNTER — Other Ambulatory Visit: Payer: Self-pay | Admitting: Family Medicine

## 2017-07-14 ENCOUNTER — Encounter: Payer: Self-pay | Admitting: Nurse Practitioner

## 2017-07-14 ENCOUNTER — Other Ambulatory Visit: Payer: Self-pay | Admitting: Nurse Practitioner

## 2017-07-15 ENCOUNTER — Other Ambulatory Visit: Payer: Self-pay | Admitting: Nurse Practitioner

## 2017-07-15 MED ORDER — HYDROCODONE-ACETAMINOPHEN 10-325 MG PO TABS: 1.0000 | ORAL_TABLET | Freq: Four times a day (QID) | ORAL | 0 refills | Status: DC | PRN

## 2017-07-15 NOTE — Progress Notes (Signed)
PMP reviewed

## 2017-07-22 ENCOUNTER — Encounter: Payer: Self-pay | Admitting: Orthopedic Surgery

## 2017-07-28 DIAGNOSIS — Z0289 Encounter for other administrative examinations: Secondary | ICD-10-CM

## 2017-07-29 ENCOUNTER — Telehealth: Payer: Self-pay | Admitting: Family Medicine

## 2017-07-29 NOTE — Telephone Encounter (Signed)
Message for Sharon [name]--patient dropped off form to be filled in.I viewed form but needs to be fill in by someone who has had face to face with patient.Form is in your box

## 2017-08-04 NOTE — Telephone Encounter (Signed)
Done

## 2017-08-07 ENCOUNTER — Ambulatory Visit (INDEPENDENT_AMBULATORY_CARE_PROVIDER_SITE_OTHER): Payer: PPO

## 2017-08-07 ENCOUNTER — Ambulatory Visit: Payer: PPO | Admitting: Orthopedic Surgery

## 2017-08-07 DIAGNOSIS — Z96651 Presence of right artificial knee joint: Secondary | ICD-10-CM

## 2017-08-07 DIAGNOSIS — Z96652 Presence of left artificial knee joint: Secondary | ICD-10-CM

## 2017-08-07 DIAGNOSIS — M7632 Iliotibial band syndrome, left leg: Secondary | ICD-10-CM | POA: Diagnosis not present

## 2017-08-07 NOTE — Progress Notes (Signed)
Annual follow-up status post knee replacement  Chief Complaint  Patient presents with  . Follow-up    Recheck on bilateral knee replacements. DOS, lt 2012 and rt 06-17-11.     Chief complaint the patient is here today for x-rays and follow-up regarding knee replacement  History The patient is  7 yrs years status post   Left  knee replacement with a [place] fixed bearing posterior stabilized total knee //6 years postop right total knee sigma fixed-bearing [place]  Overall her knees have been doing well however on the lateral side of the left knee which did require revision surgery for loose body she is complaining of pain along the lateral iliotibial band which is been present for several weeks mild to moderate in severity described as a sharp episodic pain associated with some giving way symptoms of the left knee   Review of Systems  Constitutional: Negative for chills and fever.  Musculoskeletal: Positive for back pain.  Neurological: Negative for tingling.   Physical Exam  Constitutional: She is oriented to person, place, and time. She appears well-developed and well-nourished.  Neurological: She is alert and oriented to person, place, and time. Gait normal.  Psychiatric: She has a normal mood and affect. Judgment normal.  Vitals reviewed.    Right knee exam  Incision normal Flexion about 120 degrees Extension 0 degrees Stability stable front to back side to side No effusion no tenderness  Gait pattern is normal status post total knee no assistive devices are required  Left knee exam  Clean incision normal healing Flexion about 120 degrees Extension 0 degrees Stability normal side to side No effusion tenderness in the iliotibial band  Distally the neurovascular exam is intact in the right and left lower extremity  Encounter Diagnoses  Name Primary?  . Status post left knee replacement 2012   . Status post right knee replacement 06/17/2011   . 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

## 2017-08-12 ENCOUNTER — Ambulatory Visit: Payer: PPO | Admitting: Orthopedic Surgery

## 2017-09-22 ENCOUNTER — Ambulatory Visit (INDEPENDENT_AMBULATORY_CARE_PROVIDER_SITE_OTHER): Payer: PPO | Admitting: Family Medicine

## 2017-09-22 ENCOUNTER — Encounter: Payer: Self-pay | Admitting: Family Medicine

## 2017-09-22 VITALS — BP 128/70 | Temp 98.6°F | Ht 60.0 in | Wt 279.0 lb

## 2017-09-22 DIAGNOSIS — J329 Chronic sinusitis, unspecified: Secondary | ICD-10-CM | POA: Diagnosis not present

## 2017-09-22 MED ORDER — PREDNISONE 20 MG PO TABS: ORAL_TABLET | ORAL | 0 refills | Status: DC

## 2017-09-22 MED ORDER — HYDROCODONE-HOMATROPINE 5-1.5 MG/5ML PO SYRP: ORAL_SOLUTION | ORAL | 0 refills | Status: DC

## 2017-09-22 MED ORDER — LEVOFLOXACIN 500 MG PO TABS: 500.0000 mg | ORAL_TABLET | Freq: Every day | ORAL | 0 refills | Status: DC

## 2017-09-22 NOTE — Progress Notes (Signed)
   Subjective:    Patient ID: Sharon [name], female    DOB: Apr 17, 1956, 62 y.o.   MRN: [id number]  Sinusitis  This is a new problem. Episode onset: 4 days. Associated symptoms include congestion, coughing, headaches and a sore throat. (Fever, wheezing) Past treatments include acetaminophen.   Woke up had bad inflamm and sore throat and sinus   Uses relafen and still hAD FEVER WITH IT   HAS ADDED TYL TO THE REL,  BAD HEADACHE   POS GUNKY GREEN DISCHARGE  DRAING DOWN INTO THE CHEST  Using the inhlaer four times per day           +    Review of Systems  HENT: Positive for congestion and sore throat.   Respiratory: Positive for cough.   Neurological: Positive for headaches.       Objective:   Physical Exam  Alert, mild malaise. Hydration good Vitals stable. frontal/ maxillary tenderness evident positive nasal congestion. pharynx normal neck supple  lungs clear/no crackles or wheezes. heart regular in rhythm       Assessment & Plan:  Impression rhinosinusitis likely post viral, discussed with patient. plan antibiotics prescribed. Questions answered. Symptomatic care discussed. warning signs discussed. WSL

## 2017-10-02 ENCOUNTER — Encounter: Payer: Self-pay | Admitting: Nurse Practitioner

## 2017-10-02 ENCOUNTER — Ambulatory Visit (INDEPENDENT_AMBULATORY_CARE_PROVIDER_SITE_OTHER): Payer: PPO | Admitting: Nurse Practitioner

## 2017-10-02 VITALS — BP 146/86 | Ht 60.0 in | Wt 276.0 lb

## 2017-10-02 DIAGNOSIS — J209 Acute bronchitis, unspecified: Secondary | ICD-10-CM | POA: Diagnosis not present

## 2017-10-02 DIAGNOSIS — K219 Gastro-esophageal reflux disease without esophagitis: Secondary | ICD-10-CM

## 2017-10-02 DIAGNOSIS — J4521 Mild intermittent asthma with (acute) exacerbation: Secondary | ICD-10-CM | POA: Diagnosis not present

## 2017-10-02 MED ORDER — LEVOFLOXACIN 500 MG PO TABS: 500.0000 mg | ORAL_TABLET | Freq: Every day | ORAL | 0 refills | Status: DC

## 2017-10-02 MED ORDER — FLUTICASONE FUROATE-VILANTEROL 200-25 MCG/INH IN AEPB: 1.0000 | INHALATION_SPRAY | Freq: Every day | RESPIRATORY_TRACT | 5 refills | Status: DC

## 2017-10-02 MED ORDER — EPINEPHRINE 0.3 MG/0.3ML IJ SOAJ: 0.3000 mg | Freq: Once | INTRAMUSCULAR | 2 refills | Status: AC

## 2017-10-03 ENCOUNTER — Encounter: Payer: Self-pay | Admitting: Nurse Practitioner

## 2017-10-03 NOTE — Progress Notes (Signed)
Subjective: Presents for recheck on her cough.  After her recent course of Levaquin and prednisone her mucus was almost clear but has gone back to yellow again.  Not using her albuterol as much, around 2-3 times per day.  Has been off her daily inhalers for a while since her asthma was under good control.  Was able to skip some of her doses of Protonix for her reflux at one point after her cholecystectomy but is back on daily medication especially with recent prednisone use.  Symptoms are under good control at this point.  Having a frequent occasionally productive cough.  No fever.  Mild ear pressure.  No sinus headache.  Scratchy throat.  Objective:   BP (!) 146/86   Ht 5' (1.524 m)   Wt 276 lb 0.6 oz (125.2 kg)   BMI 53.91 kg/m  NAD.  Alert, oriented.  TMs clear effusion, no erythema.  Pharynx minimally injected with PND noted.  Neck supple with mild soft anterior adenopathy.  Lungs scattered faint expiratory crackles mostly upper lobes bilaterally clear in the bases.  Occasional nonproductive cough noted.  No wheezing or tachypnea.  Heart regular rate rhythm.  Abdomen soft with minimal epigastric area tenderness.  Assessment:   Problem List Items Addressed This Visit      Respiratory   Asthma, mild intermittent - Primary   Relevant Medications   fluticasone furoate-vilanterol (BREO ELLIPTA) 200-25 MCG/INH AEPB     Digestive   Gastroesophageal reflux disease    Other Visit Diagnoses    Acute bronchitis, unspecified organism           Plan:   Meds ordered this encounter  Medications  . levofloxacin (LEVAQUIN) 500 MG tablet    Sig: Take 1 tablet (500 mg total) by mouth daily.    Dispense:  10 tablet    Refill:  0    Order Specific Question:   Supervising Provider    Answer:   [name] [[id number]]  . fluticasone furoate-vilanterol (BREO ELLIPTA) 200-25 MCG/INH AEPB    Sig: Inhale 1 puff into the lungs daily.    Dispense:  28 each    Refill:  5    Order Specific Question:    Supervising Provider    Answer:   [name] [[id number]]  . EPINEPHrine 0.3 mg/0.3 mL IJ SOAJ injection    Sig: Inject 0.3 mLs (0.3 mg total) into the muscle once for 1 dose. Prn severe allergies    Dispense:  1 Device    Refill:  2    Order Specific Question:   Supervising Provider    Answer:   [name] [[id number]]   Repeat course of Levaquin.  Restart Breo inhaler at least until the season changes.  Several factors have contributed to patient's persistent cough and congestion including weather change and pollen.  Continue follow-up with her allergist.  Call back in 7 to 10 days if cough persist, sooner if worse.  Warning signs reviewed.

## 2017-10-13 ENCOUNTER — Encounter: Payer: Self-pay | Admitting: Nurse Practitioner

## 2017-10-15 ENCOUNTER — Ambulatory Visit (INDEPENDENT_AMBULATORY_CARE_PROVIDER_SITE_OTHER): Payer: PPO

## 2017-10-15 ENCOUNTER — Encounter: Payer: Self-pay | Admitting: Orthopedic Surgery

## 2017-10-15 ENCOUNTER — Ambulatory Visit (INDEPENDENT_AMBULATORY_CARE_PROVIDER_SITE_OTHER): Payer: PPO | Admitting: Orthopedic Surgery

## 2017-10-15 VITALS — BP 123/80 | HR 78 | Wt 276.0 lb

## 2017-10-15 DIAGNOSIS — Z96652 Presence of left artificial knee joint: Secondary | ICD-10-CM

## 2017-10-15 DIAGNOSIS — M792 Neuralgia and neuritis, unspecified: Secondary | ICD-10-CM | POA: Diagnosis not present

## 2017-10-15 DIAGNOSIS — G8929 Other chronic pain: Secondary | ICD-10-CM

## 2017-10-15 DIAGNOSIS — M5442 Lumbago with sciatica, left side: Secondary | ICD-10-CM

## 2017-10-15 DIAGNOSIS — Z96651 Presence of right artificial knee joint: Secondary | ICD-10-CM

## 2017-10-15 MED ORDER — NORTRIPTYLINE HCL 10 MG PO CAPS: 10.0000 mg | ORAL_CAPSULE | Freq: Every day | ORAL | 0 refills | Status: DC

## 2017-10-15 NOTE — Progress Notes (Signed)
OFFICE VISIT   Chief Complaint  Patient presents with  . Knee Pain    Recheck on LEFT knee.     MEDICAL DECISION SECTION  xrays ordered? yes  My independent reading of xrays: X-rays from 2 previous rounds of films show a stable prosthesis without loosening left knee  X-ray today lumbar spine shows multilevel disc disease with degeneration   Encounter Diagnoses  Name Primary?  . 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 Status post right knee replacement 06/17/2011   . Chronic bilateral low back pain with left-sided sciatica   . Neurogenic pain, leg, left Yes     PLAN:  The patient appears to have radicular pain or neurogenic pain in the left side of her lower extremity with some patellofemoral symptoms which may be related to the hyperextension stress on the left knee.  However the patient was functioning fine until she fell and then started having pain in the left knee lateral left leg left hip and left lower back requiring her to go back to using a cane  She does not do well with the gabapentinnoiaids so we will try some Pamelor  Meds ordered this encounter  Medications  . nortriptyline (PAMELOR) 10 MG capsule    Sig: Take 1 capsule (10 mg total) by mouth at bedtime.    Dispense:  30 capsule    Refill:  0   We will also get an MRI if we can get it approved to evaluate her neuraxis as she has a history of prior disc disease disc problems and radicular pain    Chief Complaint  Patient presents with  . Knee Pain    Recheck on LEFT knee.    62 year old female in her normal state of health fell since that time is had left knee pain severe radiates into the left hip and lower back and down the left leg  She reports weakness difficulty walking but pain less range of motion no swelling erythema related to the knee no other associated symptoms duration of symptoms 5 to 6 months   Review of Systems  Constitutional: Negative for chills and fever.   Musculoskeletal: Positive for back pain and joint pain.  Neurological: Positive for focal weakness and weakness. Negative for tingling and sensory change.     Past Medical History:  Diagnosis Date  . Biliary colic   . Carpal tunnel syndrome    bilateral  . Chronic allergic rhinitis   . Chronic urticaria   . Complication of anesthesia    confusion for a couple of days after waking after having multiple knee surgery's, does well with spinal  . Degenerative disk disease   . Diverticulosis of colon   . Fatty liver   . Fibrocystic breast disease 1996  . GERD (gastroesophageal reflux disease)   . History of colon polyps    HYPERPLASTIC 09-14-2015  . History of kidney stones    passed on own  . History of melanoma excision    left thigh ,inside 2009--- per pt also has had severeal excision of office for almost melanoma  . Migraine    since age 27 not as frequent now  . Moderate persistent asthma, uncomplicated    followed by dr gallagher (cone allergy and asthma)  . OA (osteoarthritis)    all joints  . SUI (stress urinary incontinence, female)   . Wears glasses     Past Surgical History:  Procedure Laterality Date  . APPENDECTOMY  1973  .  BREAST EXCISIONAL BIOPSY Right 1994;  10-01-2009   2011 per path mild ductal hyperplasia/ fibrocystic breast  . CHOLECYSTECTOMY N/A 01/30/2017   Procedure: LAPAROSCOPIC CHOLECYSTECTOMY;  Surgeon: Kieth Brightly Arta Bruce, MD;  Location: [facility];  Service: General;  Laterality: N/A;  . COLONOSCOPY N/A 09/14/2015   Procedure: COLONOSCOPY;  Surgeon: Daneil Dolin, MD;  Location: [facility];  Service: Endoscopy;  Laterality: N/A;  930 - moved to 9:15 - office to notify  . ESOPHAGOGASTRODUODENOSCOPY (EGD) WITH PROPOFOL N/A 11/29/2016   Procedure: ESOPHAGOGASTRODUODENOSCOPY (EGD) WITH PROPOFOL;  Surgeon: Doran Stabler, MD;  Location: [facility];  Service: Gastroenterology;  Laterality: N/A;  . KNEE ARTHROSCOPY W/ MENISCECTOMY  Bilateral left 12-19-2006;  right 01-26-2010  . TONSILLECTOMY  1959  . TOTAL KNEE ARTHROPLASTY Left 03/10/2007  . TOTAL KNEE ARTHROPLASTY  06/17/2011   Procedure: TOTAL KNEE ARTHROPLASTY;  Surgeon: Arther Abbott, MD;  Location: [facility];  Service: Orthopedics;  Laterality: Right;  [place]  . TOTAL KNEE REVISION Left 07/08/2010  (spinal anesthesia)  . TUBAL LIGATION Bilateral YRS AGO  . VAGINAL HYSTERECTOMY  1988    Family History  Problem Relation Age of Onset  . Hypertension Father   . Lung cancer Father 53       lung cancer  . Lung disease Unknown        family history   . Allergic rhinitis Daughter   . Food Allergy Grandchild   . Anesthesia problems Neg Hx   . Hypotension Neg Hx   . Malignant hyperthermia Neg Hx   . Pseudochol deficiency Neg Hx   . Colon cancer Neg Hx   . Angioedema Neg Hx   . Asthma Neg Hx   . Eczema Neg Hx   . Immunodeficiency Neg Hx   . Urticaria Neg Hx    Social History   Tobacco Use  . Smoking status: Former Smoker    Packs/day: 1.00    Years: 25.00    Pack years: 25.00    Types: Cigarettes    Start date: 08/02/1972    Last attempt to quit: 05/27/2006    Years since quitting: 11.3  . Smokeless tobacco: Never Used  Substance Use Topics  . Alcohol use: No    Alcohol/week: 0.0 oz  . Drug use: No    @ALL @  Current Meds  Medication Sig  . albuterol (PROVENTIL HFA;VENTOLIN HFA) 108 (90 Base) MCG/ACT inhaler Inhale 2 puffs into the lungs every 4 (four) hours as needed for wheezing or shortness of breath.  Marland Kitchen albuterol (PROVENTIL) (2.5 MG/3ML) 0.083% nebulizer solution USE ONE VIAL IN NEBULIZER EVERY 4 HOURS AS NEEDED FOR WHEEZING  . ALPRAZolam (XANAX) 0.5 MG tablet TAKE ONE TABLET BY MOUTH TWICE DAILY AS NEEDED (  MAY  FILL  MONTHLY)  . Budesonide (RHINOCORT ALLERGY NA) Place 2 sprays into the nose every evening.   . Calcium Carbonate-Vitamin D (CALCIUM + D) 600-200 MG-UNIT TABS Take 1 tablet by mouth 2 (two) times daily.   . fexofenadine (ALLEGRA  ALLERGY) 180 MG tablet Take 180 mg by mouth every morning.   . fish oil-omega-3 fatty acids 1000 MG capsule Take 1 g by mouth at bedtime.   . fluticasone furoate-vilanterol (BREO ELLIPTA) 200-25 MCG/INH AEPB Inhale 1 puff into the lungs daily.  . furosemide (LASIX) 40 MG tablet Take 1.5 tablets by mouth each morning. (Patient taking differently: Take 40 mg by mouth daily. Take 1.5 tablets by mouth each morning.)  . KLOR-CON M10 10 MEQ tablet  TAKE ONE TABLET BY MOUTH TWICE DAILY  . Multiple Vitamin (MULITIVITAMIN WITH MINERALS) TABS Take 1 tablet by mouth at bedtime.  . nabumetone (RELAFEN) 750 MG tablet Take 1 tablet (750 mg total) by mouth 2 (two) times daily as needed. for pain  . ondansetron (ZOFRAN-ODT) 8 MG disintegrating tablet DISSOLVE 1 TABLET IN MOUTH EVERY 8 HOURS AS NEEDED FOR NAUSEA OR VOMITING  . pantoprazole (PROTONIX) 40 MG tablet TAKE ONE TABLET BY MOUTH ONCE DAILY FOR REFLUX (Patient taking differently: TAKE ONE TABLET BY MOUTH ONCE DAILY FOR REFLUX-- TAKES IN PM)  . pantoprazole (PROTONIX) 40 MG tablet TAKE 1 TABLET BY MOUTH ONCE DAILY FOR  REFLUX    BP 123/80   Pulse 78   Wt 276 lb (125.2 kg)   BMI 53.90 kg/m   Physical Exam  Constitutional: She is oriented to person, place, and time. She appears well-developed and well-nourished.  Significant obesity  Neurological: She is alert and oriented to person, place, and time.  Psychiatric: She has a normal mood and affect. Judgment normal.  Vitals reviewed.   Ortho Exam Left knee exam shows a stable prosthesis in flexion extension with a total range of motion of 115 degrees there is noticed instability and lateral plane of the sagittal plane  There is tenderness of the patellar tendon lateral iliotibial band left greater trochanter left hip left lower back left buttock  She is requiring the use of a cane she walks with a forward flexed spine may indicate some spinal stenosis she has excellent quadriceps strength against  resistance normal muscle tone in the left knee no joint effusion no erythema straight leg raise is mildly positive  Decreased flexion extension of the spine patient seems to do better with the flat spine flexed  Right lower extremity normal alignment appearance motion strength no laxity skin normal   Arther Abbott, MD  10/15/2017 5:31 PM

## 2017-10-16 ENCOUNTER — Encounter: Payer: Self-pay | Admitting: Nurse Practitioner

## 2017-10-17 ENCOUNTER — Other Ambulatory Visit: Payer: Self-pay | Admitting: Nurse Practitioner

## 2017-10-17 MED ORDER — HYDROCODONE-ACETAMINOPHEN 10-325 MG PO TABS: 1.0000 | ORAL_TABLET | Freq: Four times a day (QID) | ORAL | 0 refills | Status: DC | PRN

## 2017-10-17 NOTE — Progress Notes (Signed)
PMP reviewed

## 2017-10-21 ENCOUNTER — Encounter: Payer: Self-pay | Admitting: Nurse Practitioner

## 2017-10-21 NOTE — Addendum Note (Signed)
Addended by: Moreen Fowler R on: 10/21/2017 02:59 PM   Modules accepted: Orders

## 2017-10-25 ENCOUNTER — Ambulatory Visit (HOSPITAL_COMMUNITY): Payer: PPO

## 2017-10-26 ENCOUNTER — Ambulatory Visit
Admission: RE | Admit: 2017-10-26 | Discharge: 2017-10-26 | Disposition: A | Discharge status: Home / Self Care | Payer: PPO | Source: Ambulatory Visit | Attending: Orthopedic Surgery | Admitting: Orthopedic Surgery

## 2017-10-26 DIAGNOSIS — G8929 Other chronic pain: Secondary | ICD-10-CM

## 2017-10-26 DIAGNOSIS — M5442 Lumbago with sciatica, left side: Principal | ICD-10-CM

## 2017-10-26 DIAGNOSIS — M5416 Radiculopathy, lumbar region: Secondary | ICD-10-CM | POA: Diagnosis not present

## 2017-10-30 ENCOUNTER — Ambulatory Visit: Payer: PPO | Admitting: Orthopedic Surgery

## 2017-10-30 VITALS — BP 173/95 | HR 90 | Ht 60.0 in | Wt 276.0 lb

## 2017-10-30 DIAGNOSIS — Z96651 Presence of right artificial knee joint: Secondary | ICD-10-CM | POA: Diagnosis not present

## 2017-10-30 DIAGNOSIS — M5442 Lumbago with sciatica, left side: Secondary | ICD-10-CM | POA: Diagnosis not present

## 2017-10-30 DIAGNOSIS — Z96652 Presence of left artificial knee joint: Secondary | ICD-10-CM | POA: Diagnosis not present

## 2017-10-30 DIAGNOSIS — G8929 Other chronic pain: Secondary | ICD-10-CM | POA: Diagnosis not present

## 2017-10-30 DIAGNOSIS — M7632 Iliotibial band syndrome, left leg: Secondary | ICD-10-CM | POA: Diagnosis not present

## 2017-10-30 NOTE — Progress Notes (Signed)
FOLLOW UP VISIT : MRI RESULTS   Chief Complaint  Patient presents with  . Back Pain    MRI results of L-spine.     HPI: The patient is here TO DISCUSS THE RESULTS OF MRI  Sharon [name] comes in to reevaluate her situation regarding her left lower extremity leg pain.  She is 62 years old she started having severe left leg pain radiating down her left lower extremity starting in her hip.  She has a history of degenerative disc disease previous history of epidural steroid injections.  She had a left total knee which was revised secondary to rupture of the posterior cruciate ligament.  She complains of anterior knee pain and was diagnosed with patellar tendinitis treated with injection and topical medication.  She has not improved since we last saw her   ROS  Bowel and bladder function remain intact BP (!) 173/95   Pulse 90   Ht 5' (1.524 m)   Wt 276 lb (125.2 kg)   BMI 53.90 kg/m     Medical decision-making section   DATA  MRI REPORT:  CLINICAL DATA:  Severe left hip pain radiating to the knee for 5 months. History of melanoma.   EXAM: MRI LUMBAR SPINE WITHOUT CONTRAST   TECHNIQUE: Multiplanar, multisequence MR imaging of the lumbar spine was performed. No intravenous contrast was administered.   COMPARISON:  10/13/2012   FINDINGS: Segmentation:  Standard.   Alignment:  Normal.   Vertebrae: No fracture, suspicious osseous lesion, or significant marrow edema. Mild chronic degenerative endplate changes at O9-B3.   Conus medullaris and cauda equina: Conus extends to the L1-2 level. Conus and cauda equina appear normal.   Paraspinal and other soft tissues: Unremarkable.   Disc levels:   Disc desiccation throughout the lumbar spine. Mild disc space narrowing at L3-4 and L5-S1.   T12-L1: Mild facet arthrosis without disc herniation or stenosis, unchanged.   L1-2: Mild facet arthrosis without disc herniation or stenosis, unchanged.   L2-3: Mild-to-moderate right  and mild left facet arthrosis and minimal disc bulging without stenosis, unchanged.   L3-4: Mild disc bulging and mild-to-moderate facet arthrosis, slightly progressed but without stenosis.   L4-5: Mild disc bulging and mild-to-moderate facet arthrosis result in borderline to mild left lateral recess stenosis without spinal or neural foraminal stenosis, stable to at most minimally progressed.   L5-S1: Disc bulging greater to the left and mild facet arthrosis result in new mild left lateral recess stenosis without significant spinal or neural foraminal stenosis.   IMPRESSION: Slight progression of lumbar disc and facet degeneration without high-grade stenosis. Mild left lateral recess stenosis at L4-5 and L5-S1.     Electronically Signed   By: Logan Bores M.D.   On: 10/26/2017 14:09   MY READING: MRI OF THE lumbar spine shows multilevel disc disease with mild mild to moderate facet arthritis at L3-4 which could contribute to knee pain as well as L4-5 and L5-S1 which is on the left side and could contribute to her left lateral leg pain   Encounter Diagnoses  Name Primary?  . 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 Status post right knee replacement 06/17/2011   . Chronic bilateral low back pain with left-sided sciatica Yes  . Iliotibial band syndrome affecting left lower leg       PLAN: She is agreeable to epidural steroid injections.  Her patella tendinitis may be resolved once her gait returns to normal.  We did an x-ray last time it  was normal she has no signs of infection in her left tot  It should be noted that we were unable to successfully treated with amitriptyline she has trouble with gabapentin so that was not an option.  We are trying to avoid opioid therapy for her at this time.  She will follow-up after 2 injectionsal knee.  We also injected her for iliotibial band syndrome left leg which may have actually been pain radicular in nature although clinical  exam showed there was tenderness in the iliotibial band so that diagnosis is unclear.

## 2017-10-30 NOTE — Patient Instructions (Signed)
Epidural Steroid Injection  An epidural steroid injection is a shot of steroid medicine and numbing medicine that is given into the space between the spinal cord and the bones in your back (epidural space). The shot helps relieve pain caused by an irritated or swollen nerve root.  The amount of pain relief you get from the injection depends on what is causing the nerve to be swollen and irritated, and how long your pain lasts. You are more likely to benefit from this injection if your pain is strong and comes on suddenly rather than if you have had pain for a long time.  Tell a health care provider about:  · Any allergies you have.  · All medicines you are taking, including vitamins, herbs, eye drops, creams, and over-the-counter medicines.  · Any problems you or family members have had with anesthetic medicines.  · Any blood disorders you have.  · Any surgeries you have had.  · Any medical conditions you have.  · Whether you are pregnant or may be pregnant.  What are the risks?  Generally, this is a safe procedure. However, problems may occur, including:  · Headache.  · Bleeding.  · Infection.  · Allergic reaction to medicines.  · Damage to your nerves.    What happens before the procedure?  Staying hydrated  Follow instructions from your health care provider about hydration, which may include:  · Up to 2 hours before the procedure - you may continue to drink clear liquids, such as water, clear fruit juice, black coffee, and plain tea.    Eating and drinking restrictions  Follow instructions from your health care provider about eating and drinking, which may include:  · 8 hours before the procedure - stop eating heavy meals or foods such as meat, fried foods, or fatty foods.  · 6 hours before the procedure - stop eating light meals or foods, such as toast or cereal.  · 6 hours before the procedure - stop drinking milk or drinks that contain milk.  · 2 hours before the procedure - stop drinking clear  liquids.    Medicine  · You may be given medicines to lower anxiety.  · Ask your health care provider about:  ? Changing or stopping your regular medicines. This is especially important if you are taking diabetes medicines or blood thinners.  ? Taking medicines such as aspirin and ibuprofen. These medicines can thin your blood. Do not take these medicines before your procedure if your health care provider instructs you not to.  General instructions  · Plan to have someone take you home from the hospital or clinic.  What happens during the procedure?  · You may receive a medicine to help you relax (sedative).  · You will be asked to lie on your abdomen.  · The injection site will be cleaned.  · A numbing medicine (local anesthetic) will be used to numb the injection site.  · A needle will be inserted through your skin into the epidural space. You may feel some discomfort when this happens. An X-ray machine will be used to make sure the needle is put as close as possible to the affected nerve.  · A steroid medicine and a local anesthetic will be injected into the epidural space.  · The needle will be removed.  · A bandage (dressing) will be put over the injection site.  What happens after the procedure?  · Your blood pressure, heart rate, breathing rate, and blood   oxygen level will be monitored until the medicines you were given have worn off.  · Your arm or leg may feel weak or numb for a few hours.  · The injection site may feel sore.  · Do not drive for 24 hours if you received a sedative.  This information is not intended to replace advice given to you by your health care provider. Make sure you discuss any questions you have with your health care provider.  Document Released: 08/20/2007 Document Revised: 10/25/2015 Document Reviewed: 08/29/2015  Elsevier Interactive Patient Education © 2018 [employer]

## 2017-10-31 ENCOUNTER — Other Ambulatory Visit: Payer: Self-pay | Admitting: Nurse Practitioner

## 2017-10-31 ENCOUNTER — Telehealth: Payer: Self-pay | Admitting: *Deleted

## 2017-10-31 ENCOUNTER — Other Ambulatory Visit: Payer: Self-pay | Admitting: Orthopedic Surgery

## 2017-10-31 DIAGNOSIS — M5442 Lumbago with sciatica, left side: Principal | ICD-10-CM

## 2017-10-31 DIAGNOSIS — G8929 Other chronic pain: Secondary | ICD-10-CM

## 2017-10-31 NOTE — Telephone Encounter (Signed)
Fax from [employer] asking to clarify the need for the benzo along with opioid. See form on your desk in yellow folder

## 2017-10-31 NOTE — Telephone Encounter (Signed)
Done. Given to nurse for faxing.

## 2017-11-12 ENCOUNTER — Ambulatory Visit: Payer: PPO | Admitting: Orthopedic Surgery

## 2017-11-14 ENCOUNTER — Ambulatory Visit
Admission: RE | Admit: 2017-11-14 | Discharge: 2017-11-14 | Disposition: A | Discharge status: Home / Self Care | Payer: PPO | Source: Ambulatory Visit | Attending: Orthopedic Surgery | Admitting: Orthopedic Surgery

## 2017-11-14 DIAGNOSIS — M5116 Intervertebral disc disorders with radiculopathy, lumbar region: Secondary | ICD-10-CM | POA: Diagnosis not present

## 2017-11-14 DIAGNOSIS — M5442 Lumbago with sciatica, left side: Principal | ICD-10-CM

## 2017-11-14 DIAGNOSIS — G8929 Other chronic pain: Secondary | ICD-10-CM

## 2017-11-14 MED ORDER — IOPAMIDOL (ISOVUE-M 200) INJECTION 41%
1.0000 mL | Freq: Once | INTRAMUSCULAR | Status: AC
  Administered 2017-11-14: 1 mL via EPIDURAL

## 2017-11-14 MED ORDER — METHYLPREDNISOLONE ACETATE 40 MG/ML INJ SUSP (RADIOLOG
120.0000 mg | Freq: Once | INTRAMUSCULAR | Status: AC
  Administered 2017-11-14: 120 mg via EPIDURAL

## 2017-11-14 NOTE — Discharge Instructions (Signed)

## 2017-12-08 ENCOUNTER — Other Ambulatory Visit: Payer: Self-pay | Admitting: Orthopedic Surgery

## 2017-12-08 DIAGNOSIS — G8929 Other chronic pain: Secondary | ICD-10-CM

## 2017-12-08 DIAGNOSIS — M5442 Lumbago with sciatica, left side: Principal | ICD-10-CM

## 2017-12-17 ENCOUNTER — Ambulatory Visit
Admission: RE | Admit: 2017-12-17 | Discharge: 2017-12-17 | Disposition: A | Discharge status: Home / Self Care | Payer: PPO | Source: Ambulatory Visit | Attending: Orthopedic Surgery | Admitting: Orthopedic Surgery

## 2017-12-17 DIAGNOSIS — M5442 Lumbago with sciatica, left side: Principal | ICD-10-CM

## 2017-12-17 DIAGNOSIS — G8929 Other chronic pain: Secondary | ICD-10-CM

## 2017-12-17 DIAGNOSIS — M5126 Other intervertebral disc displacement, lumbar region: Secondary | ICD-10-CM | POA: Diagnosis not present

## 2017-12-17 MED ORDER — IOPAMIDOL (ISOVUE-M 200) INJECTION 41%
1.0000 mL | Freq: Once | INTRAMUSCULAR | Status: AC
  Administered 2017-12-17: 1 mL via EPIDURAL

## 2017-12-17 MED ORDER — METHYLPREDNISOLONE ACETATE 40 MG/ML INJ SUSP (RADIOLOG
120.0000 mg | Freq: Once | INTRAMUSCULAR | Status: AC
  Administered 2017-12-17: 120 mg via EPIDURAL

## 2017-12-31 ENCOUNTER — Other Ambulatory Visit: Payer: Self-pay | Admitting: Family Medicine

## 2018-01-20 ENCOUNTER — Ambulatory Visit: Payer: PPO | Admitting: Orthopedic Surgery

## 2018-01-20 VITALS — BP 142/73 | HR 83 | Ht 60.0 in | Wt 276.0 lb

## 2018-01-20 DIAGNOSIS — G8929 Other chronic pain: Secondary | ICD-10-CM

## 2018-01-20 DIAGNOSIS — M5442 Lumbago with sciatica, left side: Secondary | ICD-10-CM | POA: Diagnosis not present

## 2018-01-20 DIAGNOSIS — M7652 Patellar tendinitis, left knee: Secondary | ICD-10-CM

## 2018-01-20 DIAGNOSIS — M79662 Pain in left lower leg: Secondary | ICD-10-CM

## 2018-01-20 DIAGNOSIS — Z96651 Presence of right artificial knee joint: Secondary | ICD-10-CM | POA: Diagnosis not present

## 2018-01-20 DIAGNOSIS — Z96652 Presence of left artificial knee joint: Secondary | ICD-10-CM | POA: Diagnosis not present

## 2018-01-20 NOTE — Progress Notes (Signed)
Chief Complaint  Patient presents with  . Follow-up    Recheck on back after injections.,  Complains of pain in the left calf   Encounter Diagnoses  Name Primary?  . Chronic bilateral low back pain with left-sided sciatica   . 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 Status post right knee replacement 06/17/2011   . Patellar tendonitis of left knee   . Pain of left calf Yes    Routine follow-up status post 2 epidural injections: Improvement in left leg pain radicular symptoms and back pain  Complains of pain left calf especially with sitting he has had 2 Episodes [address] left knee  Review of Systems  Musculoskeletal: Positive for back pain, falls and joint pain.   Physical Exam  Constitutional: She is oriented to person, place, and time. She appears well-developed and well-nourished.  Neurological: She is alert and oriented to person, place, and time.  Psychiatric: She has a normal mood and affect. Judgment normal.  Vitals reviewed.  Tenderness in the posterior popliteal fossa left calf Stable left knee flexion arc 125    MRI REPORT:  CLINICAL DATA:  Severe left hip pain radiating to the knee for 5 months. History of melanoma.   EXAM: MRI LUMBAR SPINE WITHOUT CONTRAST   TECHNIQUE: Multiplanar, multisequence MR imaging of the lumbar spine was performed. No intravenous contrast was administered.   COMPARISON:  10/13/2012   FINDINGS: Segmentation:  Standard.   Alignment:  Normal.   Vertebrae: No fracture, suspicious osseous lesion, or significant marrow edema. Mild chronic degenerative endplate changes at C1-Y6.   Conus medullaris and cauda equina: Conus extends to the L1-2 level. Conus and cauda equina appear normal.   Paraspinal and other soft tissues: Unremarkable.   Disc levels:   Disc desiccation throughout the lumbar spine. Mild disc space narrowing at L3-4 and L5-S1.   T12-L1: Mild facet arthrosis without disc herniation or  stenosis, unchanged.   L1-2: Mild facet arthrosis without disc herniation or stenosis, unchanged.   L2-3: Mild-to-moderate right and mild left facet arthrosis and minimal disc bulging without stenosis, unchanged.   L3-4: Mild disc bulging and mild-to-moderate facet arthrosis, slightly progressed but without stenosis.   L4-5: Mild disc bulging and mild-to-moderate facet arthrosis result in borderline to mild left lateral recess stenosis without spinal or neural foraminal stenosis, stable to at most minimally progressed.   L5-S1: Disc bulging greater to the left and mild facet arthrosis result in new mild left lateral recess stenosis without significant spinal or neural foraminal stenosis.   IMPRESSION: Slight progression of lumbar disc and facet degeneration without high-grade stenosis. Mild left lateral recess stenosis at L4-5 and-S1.     Electronically Signed   By: Logan Bores M.D.   On: 10/26/2017 14:09   She keeps saying her knee is the issue seems to be the back is the issue the knee is been worked up there is no pathology really in the knee at this time that can surgically be corrected I do not see any loosening no instability    Her calf is hurting some nondistended for some iontophoresis and I will see her again in 2 months

## 2018-01-29 ENCOUNTER — Other Ambulatory Visit: Payer: Self-pay | Admitting: Family Medicine

## 2018-01-29 ENCOUNTER — Encounter (HOSPITAL_COMMUNITY): Payer: Self-pay | Admitting: Physical Therapy

## 2018-01-29 ENCOUNTER — Other Ambulatory Visit: Payer: Self-pay

## 2018-01-29 ENCOUNTER — Ambulatory Visit (HOSPITAL_COMMUNITY): Payer: PPO | Attending: Orthopedic Surgery | Admitting: Physical Therapy

## 2018-01-29 DIAGNOSIS — R29898 Other symptoms and signs involving the musculoskeletal system: Secondary | ICD-10-CM | POA: Insufficient documentation

## 2018-01-29 DIAGNOSIS — R2689 Other abnormalities of gait and mobility: Secondary | ICD-10-CM | POA: Insufficient documentation

## 2018-01-29 DIAGNOSIS — M79605 Pain in left leg: Secondary | ICD-10-CM | POA: Insufficient documentation

## 2018-01-29 DIAGNOSIS — R600 Localized edema: Secondary | ICD-10-CM | POA: Insufficient documentation

## 2018-01-29 NOTE — Telephone Encounter (Signed)
Unfortunately pain medication is not something that we can just fill 100 tablets here and there Please talk with the patient find out from the patient how she is using these medicines how many per day how many per week? Of this level of pain medications it is necessary for the patient to be on every 12-month basis with pain contract Further information would be appreciated I could send in a small amount to get her by until we can get her in for a visit Let me know

## 2018-01-29 NOTE — Telephone Encounter (Signed)
Pt calling in regards of wanting her HYDROcodone-acetaminophen (NORCO) 10-325 MG tablet    refilled. Please advise and call patient.

## 2018-01-29 NOTE — Therapy (Signed)
[facility] [address] [place], [place], [address] Phone: [phone]   Fax:  [phone]  Physical Therapy Evaluation  Patient Details  Name: Sharon [name] MRN: [id number] Date of Birth: [birth year] Referring Provider: Arther Abbott, MD   Encounter Date: 01/29/2018  PT End of Session - 01/29/18 1745    Visit Number  1    Number of Visits  13    Date for PT Re-Evaluation  03/13/18   Mini re-assess 02/19/18   Authorization Type  [employer]    Authorization Time Period  01/29/18 - 03/13/18    Authorization - Visit Number  1    Authorization - Number of Visits  10    PT Start Time  2542    PT Stop Time  1115    PT Time Calculation (min)  40 min    Equipment Utilized During Treatment  Gait belt    Activity Tolerance  Patient tolerated treatment well    Behavior During Therapy  [facility] for tasks assessed/performed       Past Medical History:  Diagnosis Date  . Biliary colic   . Carpal tunnel syndrome    bilateral  . Chronic allergic rhinitis   . Chronic urticaria   . Complication of anesthesia    confusion for a couple of days after waking after having multiple knee surgery's, does well with spinal  . Degenerative disk disease   . Diverticulosis of colon   . Fatty liver   . Fibrocystic breast disease 1996  . GERD (gastroesophageal reflux disease)   . History of colon polyps    HYPERPLASTIC 09-14-2015  . History of kidney stones    passed on own  . History of melanoma excision    left thigh ,inside 2009--- per pt also has had severeal excision of office for almost melanoma  . Migraine    since age 21 not as frequent now  . Moderate persistent asthma, uncomplicated    followed by dr gallagher (cone allergy and asthma)  . OA (osteoarthritis)    all joints  . SUI (stress urinary incontinence, female)   . Wears glasses     Past Surgical History:  Procedure Laterality Date  . APPENDECTOMY  1973  . BREAST  EXCISIONAL BIOPSY Right 1994;  10-01-2009   2011 per path mild ductal hyperplasia/ fibrocystic breast  . CHOLECYSTECTOMY N/A 01/30/2017   Procedure: LAPAROSCOPIC CHOLECYSTECTOMY;  Surgeon: Kieth Brightly Arta Bruce, MD;  Location: [facility];  Service: General;  Laterality: N/A;  . COLONOSCOPY N/A 09/14/2015   Procedure: COLONOSCOPY;  Surgeon: Daneil Dolin, MD;  Location: [facility];  Service: Endoscopy;  Laterality: N/A;  930 - moved to 9:15 - office to notify  . ESOPHAGOGASTRODUODENOSCOPY (EGD) WITH PROPOFOL N/A 11/29/2016   Procedure: ESOPHAGOGASTRODUODENOSCOPY (EGD) WITH PROPOFOL;  Surgeon: Doran Stabler, MD;  Location: [facility];  Service: Gastroenterology;  Laterality: N/A;  . KNEE ARTHROSCOPY W/ MENISCECTOMY Bilateral left 12-19-2006;  right 01-26-2010  . TONSILLECTOMY  1959  . TOTAL KNEE ARTHROPLASTY Left 03/10/2007  . TOTAL KNEE ARTHROPLASTY  06/17/2011   Procedure: TOTAL KNEE ARTHROPLASTY;  Surgeon: Arther Abbott, MD;  Location: [facility];  Service: Orthopedics;  Laterality: Right;  [place]  . TOTAL KNEE REVISION Left 07/08/2010  (spinal anesthesia)  . TUBAL LIGATION Bilateral YRS AGO  . VAGINAL HYSTERECTOMY  1988    There were no vitals filed for this visit.   Subjective Assessment - 01/29/18 1041  Subjective  Patient reported that the tendon that is lateral behind her left knee is causing her pain. She stated that in January she fell onto her knees and has had this pain since then. Patient reported that she has had a lot of falls recently. She stated she has had both knees replaced. Up until January she stated she was not using an assistive device. She stated she uses her 4-wheeled walker at home and she presented to evaluation today with single point cane. Patient denied any tingling or numbness currently. She stated at times the left leg feels colder than the right. Patient stated she is unable to do stairs right now. She stated when she falls she feels it is  because her knee just gives out on her. Patient denied any changes in bowel and bladder function.     Pertinent History  Bilateral total knee arthroplasties    Limitations  Sitting    How long can you sit comfortably?  15 minutes tops    How long can you stand comfortably?  20 minutes    How long can you walk comfortably?  15 minutes    Diagnostic tests  Knee x-rays negative    Patient Stated Goals  Be able to walk without assistive device    Currently in Pain?  Yes    Pain Score  6     Pain Location  Leg    Pain Orientation  Left    Pain Descriptors / Indicators  Aching    Pain Type  Chronic pain    Pain Onset  More than a month ago    Pain Frequency  Intermittent    Aggravating Factors   Exertion and when first standing up    Pain Relieving Factors  Ice    Effect of Pain on Daily Activities  Severely affects         [facility] PT Assessment - 01/29/18 0001      Assessment   Medical Diagnosis  Left Calf Pain    Referring Provider  Arther Abbott, MD    Onset Date/Surgical Date  --   June 10, 2017   Next MD Visit  --   Depends on therapy   Prior Therapy  Yes, for both knee replacements      Precautions   Precautions  None      Restrictions   Weight Bearing Restrictions  No      Balance Screen   Has the patient fallen in the past 6 months  Yes    How many times?  10    Has the patient had a decrease in activity level because of a fear of falling?   Yes    Is the patient reluctant to leave their home because of a fear of falling?   Yes      [place] residence    Living Arrangements  Alone    Type of [place] to enter    Entrance Stairs-Number of Steps  3    Entrance Stairs-Rails  None    Home Layout  Able to live on main level with bedroom/bathroom    Home Equipment  [place] - single point;Walker - 4 wheels;Shower seat      Prior Function   Level of Independence  Independent;Independent with basic ADLs     Vocation  Retired      Associate Professor   Overall Cognitive Status  Within  Functional Limits for tasks assessed      Observation/Other Assessments   Focus on Therapeutic Outcomes (FOTO)   49% (51% limited)      Observation/Other Assessments-Edema    Edema  Circumferential      Circumferential Edema   Circumferential - Right  45 cm   Right knee    Circumferential - Left   55 cm   Left knee     ROM / Strength   AROM / PROM / Strength  Strength;AROM      AROM   AROM Assessment Site  Knee    Right/Left Knee  Right;Left   WFL bilaterally     Strength   Strength Assessment Site  Hip;Knee;Ankle    Right/Left Hip  Right;Left    Right Hip Flexion  4+/5    Right Hip ABduction  4/5    Left Hip Flexion  4/5    Left Hip ABduction  3/5    Right/Left Knee  Right;Left    Right Knee Flexion  5/5    Right Knee Extension  5/5    Left Knee Flexion  4+/5   painful   Left Knee Extension  4+/5   Painful    Right/Left Ankle  Right;Left    Right Ankle Dorsiflexion  5/5    Left Ankle Dorsiflexion  5/5      Flexibility   Soft Tissue Assessment /Muscle Length  yes    Hamstrings  WFL      Palpation   Palpation comment  Patient reported tenderness to palpation at lateral aspect of left popliteal fossa      Special Tests    Special Tests  Laxity/Instability Tests    Laxity/Instability   other      Other   Findings  Negative    side  Left    comment  MCL/LCL laxity testing negative      Ambulation/Gait   Ambulation/Gait  Yes    Ambulation Distance (Feet)  220 Feet   2MWT   Assistive device  Straight cane    Gait Pattern  Decreased stance time - left;Trendelenburg;Antalgic    Ambulation Surface  Level;Indoor    Gait velocity  0.56      Standardized Balance Assessment   Standardized Balance Assessment  Five Times Sit to Stand    Five times sit to stand comments   31.72 seconds                Objective measurements completed on examination: See above findings.               PT Education - 01/29/18 1744    Education Details  Educated patient on examination findings and plan of care.     Person(s) Educated  Patient    Methods  Explanation    Comprehension  Verbalized understanding       PT Short Term Goals - 01/29/18 1756      PT SHORT TERM GOAL #1   Title  Patient will report understanding and report regular compliance with HEP to improve strength, decrease edema, and improve overall functional mobility.     Time  3    Period  Weeks    Status  New    Target Date  02/19/18      PT SHORT TERM GOAL #2   Title  Patient will demonstrate improvement of 1/2 MMT grade in all muscle groups tested as deficient at evaluation in order to improve patient's mechanics with ambulation and improve overall  functional mobility.     Time  3    Period  Weeks    Status  New    Target Date  02/19/18      PT SHORT TERM GOAL #3   Title  Patient will report that the pain in her left leg has not exceeded a 5/10 over the course of 1 week period indicating improved tolerance to daily activities.     Time  3    Period  Weeks    Status  New    Target Date  02/19/18        PT Long Term Goals - 01/29/18 1758      PT LONG TERM GOAL #1   Title  Patient will demonstrate improvement of 1 MMT grade in all muscle groups tested as deficient at evaluation in order to improve patient's mechanics with ambulation and improve overall functional mobility.     Time  6    Period  Weeks    Status  New    Target Date  03/12/18      PT LONG TERM GOAL #2   Title  Patient will demonstrate improvement of 10 seconds on the 5xSTS test indicating improved balance and functional strength.     Time  6    Period  Weeks    Status  New    Target Date  03/12/18      PT LONG TERM GOAL #3   Title  Patient will report that the pain in her left leg has not exceeded a 2/10 over the course of 1 week period indicating improved tolerance to daily activities.     Time  6     Period  Weeks    Status  New    Target Date  03/12/18      PT LONG TERM GOAL #4   Title  Patient will demonstrate ability to ambulate at a gait velocity of at least 0.8 m/s on the 2MWT with LRAD indicating improved safety with household and community ambulation.      Time  6    Period  Weeks    Status  New    Target Date  03/12/18             Plan - 01/29/18 1807    Clinical Impression Statement  Patient is a 62 year old female who presented to physical therapy with primary complaints of left leg pain. Upon examination, noted decreased strength in patient's bilateral lower extremities, more significantly on the left lower extremity. Patient's balance was also decreased as noted by the 5xSTS which patient performed in 31.72 seconds. With the 2MWT patient required a cane and ambulated at a decreased gait velocity. Patient reported that previously she was able to ambulate independently without assistive device in the community and therefore her gait is impaired to compared to her baseline. On ligament testing of knee MCL and LCL, patient was negative. In addition, patient had localized edema around her left knee as well as was very tender to palpation around the lateral aspect of the posterior left knee. Patient would benefit from skilled physical therapy in order to address the abovementioned deficits and help patient return to prior level of function.     History and Personal Factors relevant to plan of care:  Bilateral total knee replacements, frequent falls, asthma    Clinical Presentation  Stable    Clinical Presentation due to:  FOTO, 5xSTS, MMT, 2MWT, clinical judgement    Clinical Decision Making  Low    Rehab Potential  Fair    Clinical Impairments Affecting Rehab Potential  Positive: Patient motivated; Negative: chronicity of issue    PT Frequency  2x / week    PT Duration  6 weeks    PT Treatment/Interventions  ADLs/Self Care Home Management;Aquatic Therapy;Cryotherapy;Electrical  Stimulation;Iontophoresis 4mg /ml Dexamethasone;DME Instruction;Gait training;Stair training;Functional mobility training;Therapeutic activities;Therapeutic exercise;Balance training;Neuromuscular re-education;Patient/family education;Manual techniques;Passive range of motion;Dry needling;Energy conservation;Taping    PT Next Visit Plan  Review evaluation/goals; Physician requested iontophoresis: iontophoresis 1x/week for 4 weeks. Initiate HEP including supine exercises for lower extremity strengthening, re-measure edema and begin retrograde massage if still present    PT Home Exercise Plan  Initiate at first session    Consulted and Agree with Plan of Care  Patient       Patient will benefit from skilled therapeutic intervention in order to improve the following deficits and impairments:  Abnormal gait, Decreased balance, Decreased endurance, Difficulty walking, Increased edema, Decreased activity tolerance, Decreased strength, Pain  Visit Diagnosis: Pain in left leg  Localized edema  Other abnormalities of gait and mobility  Other symptoms and signs involving the musculoskeletal system     Problem List Patient Active Problem List   Diagnosis Date Noted  . Seasonal and perennial allergic rhinitis 03/06/2017  . Moderate persistent asthma, uncomplicated 94/58/5929  . Chronic nonseasonal allergic rhinitis due to fungal spores 12/02/2016  . Chronic urticaria 12/02/2016  . Anaphylactic shock due to adverse food reaction 12/02/2016  . History of colonic polyps   . Diverticulosis of colon without hemorrhage   . Asthma, mild intermittent 08/30/2015  . Encounter for screening colonoscopy 08/25/2015  . Wheezing 08/08/2015  . Gastroesophageal reflux disease 08/08/2015  . Morbid obesity ([place]) 07/14/2013  . Status post right knee replacement 06/17/2011 06/17/2013  . Status post left knee replacement 02/28/2007 06/17/2013  . DDD (degenerative disc disease), lumbosacral 10/06/2012  . Hip pain  10/06/2012  . Radicular leg pain 10/06/2012  . Bursitis of hip, right 06/25/2012  . Trochanteric bursitis of both hips 09/11/2011  . Left knee pain 03/20/2011  . OA (osteoarthritis) of knee 12/12/2010  . Mononeuritis of leg 10/24/2010  . Chronic pain 10/24/2010  . OTHER POSTSURGICAL STATUS OTHER 07/24/2010  . STRESS FRACTURE, TIBIA 04/11/2010  . TENOSYNOVITIS OF FOOT AND ANKLE 02/15/2010  . LOOSE BODY-KNEE 01/10/2010  . DERANGEMENT MENISCUS 01/01/2010  . [place] HEAD 01/01/2010  . SHOULDER PAIN 04/19/2009  . IMPINGEMENT SYNDROME 04/19/2009  . HERNIATED LUMBOSACRAL DISC 09/01/2007  . UNSPECIFIED NEURALGIA NEURITIS AND RADICULITIS 07/09/2007  . TOTAL KNEE FOLLOW-UP 06/25/2007  . Osteoarthrosis, unspecified whether generalized or localized, lower leg 03/09/2007  . KNEE PAIN 02/02/2007  . TEAR MEDIAL MENISCUS 02/02/2007   Clarene Critchley PT, DPT 6:22 PM, 01/29/18 [place], [place], [address] Phone: [phone]   Fax:  [phone]  Name: Sharon [name] MRN: [id number] Date of Birth: 08/19/1955

## 2018-01-29 NOTE — Telephone Encounter (Signed)
Left message to return call 

## 2018-01-30 ENCOUNTER — Other Ambulatory Visit: Payer: Self-pay | Admitting: Family Medicine

## 2018-01-30 MED ORDER — HYDROCODONE-ACETAMINOPHEN 10-325 MG PO TABS: 1.0000 | ORAL_TABLET | ORAL | 0 refills | Status: DC | PRN

## 2018-01-30 NOTE — Telephone Encounter (Signed)
Patient notified and will follow up as scheduled.

## 2018-01-30 NOTE — Telephone Encounter (Signed)
I will send her in 30 tablets, she can clarify with Dr. Richardson Landry the amount that will be done on a regular basis

## 2018-01-30 NOTE — Telephone Encounter (Signed)
Patient states the amount per day and per week varies as it depends on her pain. I advised the state and federal guidelines and that she would need to be schedule for a pain management here with Dr.Steve.She is scheduled for 02/04/2018 at 1:10 with Dr.Steve. She would like the medication sent in to [facility].

## 2018-02-02 ENCOUNTER — Other Ambulatory Visit: Payer: Self-pay

## 2018-02-02 ENCOUNTER — Ambulatory Visit: Payer: PPO | Admitting: Nurse Practitioner

## 2018-02-02 MED ORDER — NABUMETONE 750 MG PO TABS: 750.0000 mg | ORAL_TABLET | Freq: Two times a day (BID) | ORAL | 2 refills | Status: DC | PRN

## 2018-02-03 ENCOUNTER — Ambulatory Visit (HOSPITAL_COMMUNITY): Payer: PPO | Admitting: Physical Therapy

## 2018-02-03 DIAGNOSIS — R29898 Other symptoms and signs involving the musculoskeletal system: Secondary | ICD-10-CM

## 2018-02-03 DIAGNOSIS — M79605 Pain in left leg: Secondary | ICD-10-CM

## 2018-02-03 DIAGNOSIS — R2689 Other abnormalities of gait and mobility: Secondary | ICD-10-CM

## 2018-02-03 DIAGNOSIS — R600 Localized edema: Secondary | ICD-10-CM

## 2018-02-03 NOTE — Therapy (Signed)
[place], [place], [address] Phone: [phone]   Fax:  [phone]  Physical Therapy Treatment  Patient Details  Name: Sharon [name] MRN: [id number] Date of Birth: Dec 08, 1955 Referring Provider: Arther Abbott, MD   Encounter Date: 02/03/2018  PT End of Session - 02/03/18 1049    Visit Number  2    Number of Visits  13    Date for PT Re-Evaluation  03/13/18   Mini re-assess 02/19/18   Authorization Type  [employer]    Authorization Time Period  01/29/18 - 03/13/18    Authorization - Visit Number  2    Authorization - Number of Visits  10    PT Start Time  0950    PT Stop Time  1035    PT Time Calculation (min)  45 min    Equipment Utilized During Treatment  Gait belt    Activity Tolerance  Patient tolerated treatment well    Behavior During Therapy  [facility] for tasks assessed/performed       Past Medical History:  Diagnosis Date  . Biliary colic   . Carpal tunnel syndrome    bilateral  . Chronic allergic rhinitis   . Chronic urticaria   . Complication of anesthesia    confusion for a couple of days after waking after having multiple knee surgery's, does well with spinal  . Degenerative disk disease   . Diverticulosis of colon   . Fatty liver   . Fibrocystic breast disease 1996  . GERD (gastroesophageal reflux disease)   . History of colon polyps    HYPERPLASTIC 09-14-2015  . History of kidney stones    passed on own  . History of melanoma excision    left thigh ,inside 2009--- per pt also has had severeal excision of office for almost melanoma  . Migraine    since age 89 not as frequent now  . Moderate persistent asthma, uncomplicated    followed by dr gallagher (cone allergy and asthma)  . OA (osteoarthritis)    all joints  . SUI (stress urinary incontinence, female)   . Wears glasses     Past Surgical History:  Procedure Laterality Date  . APPENDECTOMY  1973  . BREAST  EXCISIONAL BIOPSY Right 1994;  10-01-2009   2011 per path mild ductal hyperplasia/ fibrocystic breast  . CHOLECYSTECTOMY N/A 01/30/2017   Procedure: LAPAROSCOPIC CHOLECYSTECTOMY;  Surgeon: Kieth Brightly Arta Bruce, MD;  Location: [facility];  Service: General;  Laterality: N/A;  . COLONOSCOPY N/A 09/14/2015   Procedure: COLONOSCOPY;  Surgeon: Daneil Dolin, MD;  Location: [facility];  Service: Endoscopy;  Laterality: N/A;  930 - moved to 9:15 - office to notify  . ESOPHAGOGASTRODUODENOSCOPY (EGD) WITH PROPOFOL N/A 11/29/2016   Procedure: ESOPHAGOGASTRODUODENOSCOPY (EGD) WITH PROPOFOL;  Surgeon: Doran Stabler, MD;  Location: [facility];  Service: Gastroenterology;  Laterality: N/A;  . KNEE ARTHROSCOPY W/ MENISCECTOMY Bilateral left 12-19-2006;  right 01-26-2010  . TONSILLECTOMY  1959  . TOTAL KNEE ARTHROPLASTY Left 03/10/2007  . TOTAL KNEE ARTHROPLASTY  06/17/2011   Procedure: TOTAL KNEE ARTHROPLASTY;  Surgeon: Arther Abbott, MD;  Location: [facility];  Service: Orthopedics;  Laterality: Right;  [place]  . TOTAL KNEE REVISION Left 07/08/2010  (spinal anesthesia)  . TUBAL LIGATION Bilateral YRS AGO  . VAGINAL HYSTERECTOMY  1988    There were no vitals filed for this visit.  Subjective Assessment - 02/03/18 1055    Subjective  PT comes today with all wheel walker stating she can move quicker and feels more secure using this.  States her pain is also high today in her LT knee at 8/10.    Currently in Pain?  Yes    Pain Score  8     Pain Location  Knee    Pain Orientation  Left    Pain Descriptors / Indicators  Aching                       [facility] Adult PT Treatment/Exercise - 02/03/18 0001      Knee/Hip Exercises: Seated   Sit to Sand  5 reps;without UE support   hands on knees     Knee/Hip Exercises: Supine   Bridges  10 reps    Straight Leg Raises  10 reps      Knee/Hip Exercises: Sidelying   Hip ABduction  Left;10 reps    Clams  Lt 10X5" holds       Modalities   Modalities  Iontophoresis      Iontophoresis   Type of Iontophoresis  Dexamethasone    Location  Lt lateral head of gastroc    Dose  1 cc    Time  6 hour take home patch      Manual Therapy   Manual Therapy  Soft tissue mobilization;Edema management    Manual therapy comments  completed seperately from all other skilled interventions.  Supine with LE's elevated and prior to ionto application    Edema Management  retro massage to Lt knee    Soft tissue mobilization  to posteriolateral Lt knee at tendon             PT Education - 02/03/18 1023    Education Details  reviewed goals and plan moving forward.  Explained ionto and given information sheet with precautions.     Person(s) Educated  Patient    Methods  Explanation;Demonstration;Tactile cues;Verbal cues;Handout    Comprehension  Verbalized understanding       PT Short Term Goals - 01/29/18 1756      PT SHORT TERM GOAL #1   Title  Patient will report understanding and report regular compliance with HEP to improve strength, decrease edema, and improve overall functional mobility.     Time  3    Period  Weeks    Status  New    Target Date  02/19/18      PT SHORT TERM GOAL #2   Title  Patient will demonstrate improvement of 1/2 MMT grade in all muscle groups tested as deficient at evaluation in order to improve patient's mechanics with ambulation and improve overall functional mobility.     Time  3    Period  Weeks    Status  New    Target Date  02/19/18      PT SHORT TERM GOAL #3   Title  Patient will report that the pain in her left leg has not exceeded a 5/10 over the course of 1 week period indicating improved tolerance to daily activities.     Time  3    Period  Weeks    Status  New    Target Date  02/19/18        PT Long Term Goals - 01/29/18 1758      PT LONG TERM GOAL #1   Title  Patient will demonstrate improvement of 1 MMT grade in all muscle groups tested as deficient at  evaluation in order to improve patient's mechanics with ambulation and improve overall functional mobility.     Time  6    Period  Weeks    Status  New    Target Date  03/12/18      PT LONG TERM GOAL #2   Title  Patient will demonstrate improvement of 10 seconds on the 5xSTS test indicating improved balance and functional strength.     Time  6    Period  Weeks    Status  New    Target Date  03/12/18      PT LONG TERM GOAL #3   Title  Patient will report that the pain in her left leg has not exceeded a 2/10 over the course of 1 week period indicating improved tolerance to daily activities.     Time  6    Period  Weeks    Status  New    Target Date  03/12/18      PT LONG TERM GOAL #4   Title  Patient will demonstrate ability to ambulate at a gait velocity of at least 0.8 m/s on the 2MWT with LRAD indicating improved safety with household and community ambulation.      Time  6    Period  Weeks    Status  New    Target Date  03/12/18            Plan - 02/03/18 1050    Clinical Impression Statement  Reveiwed evaluation goals and POC moving forward.  Progressed to LE strengthening, mostly non weight bearing today due to high pain level.  Pt was able to complete sit to stands, however requires UE's to push up on thighs.  Palpation and soft tissue techniuqes reveal extreme tenderness in Lt knee and perimeter.  Ionto introduced and place on head of lateral gastroc with instructions to remove in 6 hours.  Pt without change of symptoms at end of session.     Rehab Potential  Fair    Clinical Impairments Affecting Rehab Potential  Positive: Patient motivated; Negative: chronicity of issue    PT Frequency  2x / week    PT Duration  6 weeks    PT Treatment/Interventions  ADLs/Self Care Home Management;Aquatic Therapy;Cryotherapy;Electrical Stimulation;Iontophoresis 4mg /ml Dexamethasone;DME Instruction;Gait training;Stair training;Functional mobility training;Therapeutic  activities;Therapeutic exercise;Balance training;Neuromuscular re-education;Patient/family education;Manual techniques;Passive range of motion;Dry needling;Energy conservation;Taping    PT Next Visit Plan  continue with iontophoresis 1x/week for 4 weeks. Initiate HEP including supine exercises for lower extremity strengthening.  continue manual techniques to reduce edema and pain.   Begin standing hip and knee strengthening exercises if pain reduced next session.     PT Home Exercise Plan  Initiate at first session    Consulted and Agree with Plan of Care  Patient       Patient will benefit from skilled therapeutic intervention in order to improve the following deficits and impairments:  Abnormal gait, Decreased balance, Decreased endurance, Difficulty walking, Increased edema, Decreased activity tolerance, Decreased strength, Pain  Visit Diagnosis: Pain in left leg  Localized edema  Other abnormalities of gait and mobility  Other symptoms and signs involving the musculoskeletal system     Problem List Patient Active Problem List   Diagnosis Date Noted  . Seasonal and perennial allergic rhinitis 03/06/2017  . Moderate persistent asthma, uncomplicated 88/50/2774  . Chronic nonseasonal allergic rhinitis due to fungal spores 12/02/2016  . Chronic urticaria 12/02/2016  . Anaphylactic shock due to adverse food reaction  12/02/2016  . History of colonic polyps   . Diverticulosis of colon without hemorrhage   . Asthma, mild intermittent 08/30/2015  . Encounter for screening colonoscopy 08/25/2015  . Wheezing 08/08/2015  . Gastroesophageal reflux disease 08/08/2015  . Morbid obesity ([place]) 07/14/2013  . Status post right knee replacement 06/17/2011 06/17/2013  . Status post left knee replacement 02/28/2007 06/17/2013  . DDD (degenerative disc disease), lumbosacral 10/06/2012  . Hip pain 10/06/2012  . Radicular leg pain 10/06/2012  . Bursitis of hip, right 06/25/2012  . Trochanteric  bursitis of both hips 09/11/2011  . Left knee pain 03/20/2011  . OA (osteoarthritis) of knee 12/12/2010  . Mononeuritis of leg 10/24/2010  . Chronic pain 10/24/2010  . OTHER POSTSURGICAL STATUS OTHER 07/24/2010  . STRESS FRACTURE, TIBIA 04/11/2010  . TENOSYNOVITIS OF FOOT AND ANKLE 02/15/2010  . LOOSE BODY-KNEE 01/10/2010  . DERANGEMENT MENISCUS 01/01/2010  . [place] HEAD 01/01/2010  . SHOULDER PAIN 04/19/2009  . IMPINGEMENT SYNDROME 04/19/2009  . HERNIATED LUMBOSACRAL DISC 09/01/2007  . UNSPECIFIED NEURALGIA NEURITIS AND RADICULITIS 07/09/2007  . TOTAL KNEE FOLLOW-UP 06/25/2007  . Osteoarthrosis, unspecified whether generalized or localized, lower leg 03/09/2007  . KNEE PAIN 02/02/2007  . TEAR MEDIAL MENISCUS 02/02/2007   Teena Irani, PTA/CLT [phone]  Teena Irani 02/03/2018, 10:56 AM  [place], [place], [address] Phone: [phone]   Fax:  [phone]  Name: Sharon [name] MRN: [id number] Date of Birth: March 04, 1956

## 2018-02-03 NOTE — Patient Instructions (Signed)

## 2018-02-04 ENCOUNTER — Ambulatory Visit (INDEPENDENT_AMBULATORY_CARE_PROVIDER_SITE_OTHER): Payer: PPO | Admitting: Family Medicine

## 2018-02-04 ENCOUNTER — Encounter: Payer: Self-pay | Admitting: Family Medicine

## 2018-02-04 VITALS — BP 136/82 | Ht 60.0 in | Wt 279.4 lb

## 2018-02-04 DIAGNOSIS — J4521 Mild intermittent asthma with (acute) exacerbation: Secondary | ICD-10-CM

## 2018-02-04 DIAGNOSIS — G894 Chronic pain syndrome: Secondary | ICD-10-CM

## 2018-02-04 DIAGNOSIS — Z23 Encounter for immunization: Secondary | ICD-10-CM

## 2018-02-04 MED ORDER — HYDROCODONE-ACETAMINOPHEN 10-325 MG PO TABS: 1.0000 | ORAL_TABLET | Freq: Two times a day (BID) | ORAL | 0 refills | Status: DC | PRN

## 2018-02-04 NOTE — Progress Notes (Signed)
   Subjective:    Patient ID: Sharon [name], female    DOB: November 05, 1955, 62 y.o.   MRN: [id number]  HPI  Patient arrives to discuss pain medication. Patient normally was getting #100 hydrocodone every 3 months-last filled in May 2019. Patient had a rough summer with pain but is doing better since back injection. Patient does not take everyday- Relafen normally helps.  Asthma overall stable, doing cgood, compliant with the med.  No major issues with wheezing.  Overall under good control.  Patient claims full compliance with medicine.  Rare use of rescue nebulizer.   Pt has had freq falls, and has chronic back pain, uses hydrocod prn  Has trouble with chronic left knee pain and instability  Dr Aline Brochure has in phys therapy , two d per wk, helping some  One in June and july   Uses one half to one on the pain med   Hold on Urine Drug Screen per Dr Richardson Landry- patient takes medication intermittently.  Patient has substantial issue with morbid obesity.  She has attended classes in this regard.  She feels that she could not tolerate this because she would no longer able to take anti-inflammatory medication for her chronic arthritis   Review of Systems No headache, no major weight loss or weight gain, no chest pain  abdominal pain no change in bowel habits complete ROS otherwise negative     Objective:   Physical Exam Alert and oriented, vitals reviewed and stable, NAD ENT-TM's and ext canals WNL bilat via otoscopic exam Soft palate, tonsils and post pharynx WNL via oropharyngeal exam Neck-symmetric, no masses; thyroid nonpalpable and nontender Pulmonary-no tachypnea or accessory muscle use; Clear without wheezes via auscultation Card--no abnrml murmurs, rhythm reg and rate WNL Carotid pulses symmetric, without bruits Substantial morbid obesity present.       Assessment & Plan:  Impression asthma.  Clinically stable compliant with medications controlling symptoms well.  Definitely  should get flu shot discussed patient in agreement  2.  Morbid obesity.  Patient working hard on diet but unable to exercise with her challenges  3.  Advanced arthritis of knees.  Followed by Dr. Aline Brochure for this  4.  Chronic pain.  Patient uses hydrocodone intermittently.  Hoyle Sauer has been giving 100 tablets at a time that she stretches out.  We will need to change this to monthly amount.  Patient feels that 30/month should manage  Flu shot today  Impression: Chronic pain. Patient compliant with medication. No substantial side effects. [place] controlled substance registry reviewed to ensure compliance and proper use of medication. Patient aware goal of medicine is not complete resolution of pain but to control his symptoms to improve his functional capacity. Aware of potential adverse side effects

## 2018-02-05 ENCOUNTER — Telehealth (HOSPITAL_COMMUNITY): Payer: Self-pay | Admitting: Family Medicine

## 2018-02-05 ENCOUNTER — Ambulatory Visit (HOSPITAL_COMMUNITY): Payer: PPO | Admitting: Physical Therapy

## 2018-02-05 NOTE — Telephone Encounter (Signed)
02/05/18  pt called to cx she just said that she wouldn't be able to make it today

## 2018-02-10 ENCOUNTER — Ambulatory Visit (HOSPITAL_COMMUNITY): Payer: PPO | Admitting: Physical Therapy

## 2018-02-10 ENCOUNTER — Encounter (HOSPITAL_COMMUNITY): Payer: Self-pay | Admitting: Physical Therapy

## 2018-02-10 DIAGNOSIS — R2689 Other abnormalities of gait and mobility: Secondary | ICD-10-CM

## 2018-02-10 DIAGNOSIS — M79605 Pain in left leg: Secondary | ICD-10-CM | POA: Diagnosis not present

## 2018-02-10 DIAGNOSIS — R29898 Other symptoms and signs involving the musculoskeletal system: Secondary | ICD-10-CM

## 2018-02-10 DIAGNOSIS — R600 Localized edema: Secondary | ICD-10-CM

## 2018-02-10 NOTE — Therapy (Signed)
Sharon [name], [place], [address] Phone: [phone]   Fax:  [phone]  Physical Therapy Treatment  Patient Details  Name: Sharon [name] MRN: [id number] Date of Birth: February 13, 1956 Referring Provider: Arther Abbott, MD   Encounter Date: 02/10/2018  PT End of Session - 02/10/18 1054    Visit Number  3    Number of Visits  13    Date for PT Re-Evaluation  03/13/18   Mini re-assess 02/19/18   Authorization Type  [employer]    Authorization Time Period  01/29/18 - 03/13/18    Authorization - Visit Number  3    Authorization - Number of Visits  10    PT Start Time  1033    PT Stop Time  1111    PT Time Calculation (min)  38 min    Equipment Utilized During Treatment  Gait belt    Activity Tolerance  Patient tolerated treatment well    Behavior During Therapy  [facility] for tasks assessed/performed       Past Medical History:  Diagnosis Date  . Biliary colic   . Carpal tunnel syndrome    bilateral  . Chronic allergic rhinitis   . Chronic urticaria   . Complication of anesthesia    confusion for a couple of days after waking after having multiple knee surgery's, does well with spinal  . Degenerative disk disease   . Diverticulosis of colon   . Fatty liver   . Fibrocystic breast disease 1996  . GERD (gastroesophageal reflux disease)   . History of colon polyps    HYPERPLASTIC 09-14-2015  . History of kidney stones    passed on own  . History of melanoma excision    left thigh ,inside 2009--- per pt also has had severeal excision of office for almost melanoma  . Migraine    since age 66 not as frequent now  . Moderate persistent asthma, uncomplicated    followed by dr gallagher (cone allergy and asthma)  . OA (osteoarthritis)    all joints  . SUI (stress urinary incontinence, female)   . Wears glasses     Past Surgical History:  Procedure Laterality Date  . APPENDECTOMY  1973  . BREAST  EXCISIONAL BIOPSY Right 1994;  10-01-2009   2011 per path mild ductal hyperplasia/ fibrocystic breast  . CHOLECYSTECTOMY N/A 01/30/2017   Procedure: LAPAROSCOPIC CHOLECYSTECTOMY;  Surgeon: Kieth Brightly Arta Bruce, MD;  Location: [facility];  Service: General;  Laterality: N/A;  . COLONOSCOPY N/A 09/14/2015   Procedure: COLONOSCOPY;  Surgeon: Daneil Dolin, MD;  [facility];  Service: Endoscopy;  Laterality: N/A;  930 - moved to 9:15 - office to notify  . ESOPHAGOGASTRODUODENOSCOPY (EGD) WITH PROPOFOL N/A 11/29/2016   Procedure: ESOPHAGOGASTRODUODENOSCOPY (EGD) WITH PROPOFOL;  Surgeon: Doran Stabler, MD;  [facility]: [facility];  Service: Gastroenterology;  Laterality: N/A;  . KNEE ARTHROSCOPY W/ MENISCECTOMY Bilateral left 12-19-2006;  right 01-26-2010  . TONSILLECTOMY  1959  . TOTAL KNEE ARTHROPLASTY Left 03/10/2007  . TOTAL KNEE ARTHROPLASTY  06/17/2011   Procedure: TOTAL KNEE ARTHROPLASTY;  Surgeon: Arther Abbott, MD;  Location: [facility];  Service: Orthopedics;  Laterality: Right;  [place]  . TOTAL KNEE REVISION Left 07/08/2010  (spinal anesthesia)  . TUBAL LIGATION Bilateral YRS AGO  . VAGINAL HYSTERECTOMY  1988    There were no vitals filed for this visit.  Subjective Assessment - 02/10/18 1035    Subjective  Patient reported that she is having 8/10 pain which she stated is in her left hip and knee. She stated she had another fall last wednesday.     Currently in Pain?  Yes    Pain Score  8     Pain Location  Knee    Pain Orientation  Left    Pain Descriptors / Indicators  Burning    Pain Type  Chronic pain         [facility] PT Assessment - 02/10/18 0001      Other   Findings  Unable to test    side  Left    comment  Anterior/posterior drawer unable to perform due to pain level                   [facility] Adult PT Treatment/Exercise - 02/10/18 0001      Knee/Hip Exercises: Supine   Quad Sets  Strengthening;Left;1 set;10 reps    Quad Sets  Limitations  5 second holds    Bridges Limitations  Attempted 1, but was too painful so discontinued    Straight Leg Raises  10 reps    Other Supine Knee/Hip Exercises  Gluteal sets x 10 5'' holds    Other Supine Knee/Hip Exercises  Ankle pumps x30 to decrease edema       Knee/Hip Exercises: Sidelying   Hip ABduction  Left;10 reps    Clams  Lt 15X5" holds      Modalities   Modalities  Iontophoresis      Iontophoresis   Type of Iontophoresis  Dexamethasone    Location  Lt lateral head of gastroc    Dose  1 mL    Time  6 hour take home patch      Manual Therapy   Manual Therapy  Soft tissue mobilization;Edema management    Manual therapy comments  completed seperately from all other skilled interventions.  Supine with LE's elevated and prior to ionto application    Edema Management  retro massage to Lt knee    Soft tissue mobilization  to posteriolateral Lt knee at tendon             PT Education - 02/10/18 1209    Education Details  Discussed plan to continue with therapy using iontophoresis and then possible re-assessment to see how patient has progressed. Explained purpose of iontophoresis and to remove the patch if patient experienced any increased discomfort as well as to remove the patch after 6 hours.     Person(s) Educated  Patient    Methods  Explanation    Comprehension  Verbalized understanding       PT Short Term Goals - 02/10/18 1213      PT SHORT TERM GOAL #1   Title  Patient will report understanding and report regular compliance with HEP to improve strength, decrease edema, and improve overall functional mobility.     Time  3    Period  Weeks    Status  On-going      PT SHORT TERM GOAL #2   Title  Patient will demonstrate improvement of 1/2 MMT grade in all muscle groups tested as deficient at evaluation in order to improve patient's mechanics with ambulation and improve overall functional mobility.     Time  3    Period  Weeks    Status  On-going       PT SHORT TERM GOAL #3   Title  Patient will report that the pain  in her left leg has not exceeded a 5/10 over the course of 1 week period indicating improved tolerance to daily activities.     Time  3    Period  Weeks    Status  On-going        PT Long Term Goals - 02/10/18 1213      PT LONG TERM GOAL #1   Title  Patient will demonstrate improvement of 1 MMT grade in all muscle groups tested as deficient at evaluation in order to improve patient's mechanics with ambulation and improve overall functional mobility.     Time  6    Period  Weeks    Status  On-going      PT LONG TERM GOAL #2   Title  Patient will demonstrate improvement of 10 seconds on the 5xSTS test indicating improved balance and functional strength.     Time  6    Period  Weeks    Status  On-going      PT LONG TERM GOAL #3   Title  Patient will report that the pain in her left leg has not exceeded a 2/10 over the course of 1 week period indicating improved tolerance to daily activities.     Time  6    Period  Weeks    Status  On-going      PT LONG TERM GOAL #4   Title  Patient will demonstrate ability to ambulate at a gait velocity of at least 0.8 m/s on the 2MWT with LRAD indicating improved safety with household and community ambulation.      Time  6    Period  Weeks    Status  On-going            Plan - 02/10/18 1210    Clinical Impression Statement  This session patient reported a high level of pain as she experienced another fall last week. Modified treatment session within patient's pain level. Had patient perform mat exercises this session. Unable to more completely test knee for laxity due to pain level. Ended session with manual therapy to decrease edema and patient's pain level. Patient reported pain reduction to 6/10 at end of session. Informed patient of safety information about iontophoresis and received patient's consent for patch placement at end of session. Plan to continue following up  regarding effectiveness.     Rehab Potential  Fair    Clinical Impairments Affecting Rehab Potential  Positive: Patient motivated; Negative: chronicity of issue    PT Frequency  2x / week    PT Duration  6 weeks    PT Treatment/Interventions  ADLs/Self Care Home Management;Aquatic Therapy;Cryotherapy;Electrical Stimulation;Iontophoresis 4mg /ml Dexamethasone;DME Instruction;Gait training;Stair training;Functional mobility training;Therapeutic activities;Therapeutic exercise;Balance training;Neuromuscular re-education;Patient/family education;Manual techniques;Passive range of motion;Dry needling;Energy conservation;Taping    PT Next Visit Plan  Continue iontophoresis for 4 consecutive times (This session was 2/4) Discuss or initaite HEP including supine exercises for lower extremity strengthening and ankle pumps.  continue manual techniques to reduce edema and pain.   Begin standing hip and knee strengthening exercises if pain reduced next session.     PT Home Exercise Plan  Initiate at first session    Consulted and Agree with Plan of Care  Patient       Patient will benefit from skilled therapeutic intervention in order to improve the following deficits and impairments:  Abnormal gait, Decreased balance, Decreased endurance, Difficulty walking, Increased edema, Decreased activity tolerance, Decreased strength, Pain  Visit Diagnosis: Pain in left leg  Localized edema  Other abnormalities of gait and mobility  Other symptoms and signs involving the musculoskeletal system     Problem List Patient Active Problem List   Diagnosis Date Noted  . Seasonal and perennial allergic rhinitis 03/06/2017  . Moderate persistent asthma, uncomplicated 61/60/7371  . Chronic nonseasonal allergic rhinitis due to fungal spores 12/02/2016  . Chronic urticaria 12/02/2016  . Anaphylactic shock due to adverse food reaction 12/02/2016  . History of colonic polyps   . Diverticulosis of colon without  hemorrhage   . Asthma, mild intermittent 08/30/2015  . Encounter for screening colonoscopy 08/25/2015  . Wheezing 08/08/2015  . Gastroesophageal reflux disease 08/08/2015  . Morbid obesity ([place]) 07/14/2013  . Status post right knee replacement 06/17/2011 06/17/2013  . Status post left knee replacement 02/28/2007 06/17/2013  . DDD (degenerative disc disease), lumbosacral 10/06/2012  . Hip pain 10/06/2012  . Radicular leg pain 10/06/2012  . Bursitis of hip, right 06/25/2012  . Trochanteric bursitis of both hips 09/11/2011  . Left knee pain 03/20/2011  . OA (osteoarthritis) of knee 12/12/2010  . Mononeuritis of leg 10/24/2010  . Chronic pain 10/24/2010  . OTHER POSTSURGICAL STATUS OTHER 07/24/2010  . STRESS FRACTURE, TIBIA 04/11/2010  . TENOSYNOVITIS OF FOOT AND ANKLE 02/15/2010  . LOOSE BODY-KNEE 01/10/2010  . DERANGEMENT MENISCUS 01/01/2010  . [place] HEAD 01/01/2010  . SHOULDER PAIN 04/19/2009  . IMPINGEMENT SYNDROME 04/19/2009  . HERNIATED LUMBOSACRAL DISC 09/01/2007  . UNSPECIFIED NEURALGIA NEURITIS AND RADICULITIS 07/09/2007  . TOTAL KNEE FOLLOW-UP 06/25/2007  . Osteoarthrosis, unspecified whether generalized or localized, lower leg 03/09/2007  . KNEE PAIN 02/02/2007  . TEAR MEDIAL MENISCUS 02/02/2007   Clarene Critchley PT, DPT 12:15 PM, 02/10/18 [place], [place], [address] Phone: [phone]   Fax:  [phone]  Name: JONITA HIROTA MRN: [id number] Date of Birth: Sep 30, 1955

## 2018-02-12 ENCOUNTER — Encounter (HOSPITAL_COMMUNITY): Payer: Self-pay

## 2018-02-12 ENCOUNTER — Ambulatory Visit (HOSPITAL_COMMUNITY): Payer: PPO

## 2018-02-12 DIAGNOSIS — M79605 Pain in left leg: Secondary | ICD-10-CM

## 2018-02-12 DIAGNOSIS — R600 Localized edema: Secondary | ICD-10-CM

## 2018-02-12 DIAGNOSIS — R2689 Other abnormalities of gait and mobility: Secondary | ICD-10-CM

## 2018-02-12 DIAGNOSIS — R29898 Other symptoms and signs involving the musculoskeletal system: Secondary | ICD-10-CM

## 2018-02-12 NOTE — Therapy (Signed)
[place], [place], [address] Phone: [phone]   Fax:  [phone]  Physical Therapy Treatment  Patient Details  Name: Sharon [name] MRN: [id number] Date of Birth: [birth year] Referring Provider: Arther Abbott, MD   Encounter Date: 02/12/2018  PT End of Session - 02/12/18 1033    Visit Number  4    Number of Visits  13    Date for PT Re-Evaluation  03/13/18   Mini re-assess 02/19/18   Authorization Type  [employer]    Authorization Time Period  01/29/18 - 03/13/18    Authorization - Visit Number  4    Authorization - Number of Visits  10    PT Start Time  1034    PT Stop Time  1116    PT Time Calculation (min)  42 min    Equipment Utilized During Treatment  Gait belt    Activity Tolerance  Patient tolerated treatment well    Behavior During Therapy  [facility] for tasks assessed/performed       Past Medical History:  Diagnosis Date  . Biliary colic   . Carpal tunnel syndrome    bilateral  . Chronic allergic rhinitis   . Chronic urticaria   . Complication of anesthesia    confusion for a couple of days after waking after having multiple knee surgery's, does well with spinal  . Degenerative disk disease   . Diverticulosis of colon   . Fatty liver   . Fibrocystic breast disease 1996  . GERD (gastroesophageal reflux disease)   . History of colon polyps    HYPERPLASTIC 09-14-2015  . History of kidney stones    passed on own  . History of melanoma excision    left thigh ,inside 2009--- per pt also has had severeal excision of office for almost melanoma  . Migraine    since age 21 not as frequent now  . Moderate persistent asthma, uncomplicated    followed by dr gallagher (cone allergy and asthma)  . OA (osteoarthritis)    all joints  . SUI (stress urinary incontinence, female)   . Wears glasses     Past Surgical History:  Procedure Laterality Date  . APPENDECTOMY  1973  . BREAST  EXCISIONAL BIOPSY Right 1994;  10-01-2009   2011 per path mild ductal hyperplasia/ fibrocystic breast  . CHOLECYSTECTOMY N/A 01/30/2017   Procedure: LAPAROSCOPIC CHOLECYSTECTOMY;  Surgeon: Kieth Brightly Arta Bruce, MD;  Location: [facility];  Service: General;  Laterality: N/A;  . COLONOSCOPY N/A 09/14/2015   Procedure: COLONOSCOPY;  Surgeon: Daneil Dolin, MD;  [facility];  Service: Endoscopy;  Laterality: N/A;  930 - moved to 9:15 - office to notify  . ESOPHAGOGASTRODUODENOSCOPY (EGD) WITH PROPOFOL N/A 11/29/2016   Procedure: ESOPHAGOGASTRODUODENOSCOPY (EGD) WITH PROPOFOL;  Surgeon: Doran Stabler, MD;  [facility];  Service: Gastroenterology;  Laterality: N/A;  . KNEE ARTHROSCOPY W/ MENISCECTOMY Bilateral left 12-19-2006;  right 01-26-2010  . TONSILLECTOMY  1959  . TOTAL KNEE ARTHROPLASTY Left 03/10/2007  . TOTAL KNEE ARTHROPLASTY  06/17/2011   Procedure: TOTAL KNEE ARTHROPLASTY;  Surgeon: Arther Abbott, MD;  Location: [facility];  Service: Orthopedics;  Laterality: Right;  [place]  . TOTAL KNEE REVISION Left 07/08/2010  (spinal anesthesia)  . TUBAL LIGATION Bilateral YRS AGO  . VAGINAL HYSTERECTOMY  1988    There were no vitals filed for this visit.  Subjective Assessment - 02/12/18 1033    Subjective  Patient reported 7/10 in left hip and knee, swelling better but coming back. Hasn't fallen this week. No improvement with PT thus far but needs to continue strengthening to help with pain. Medication patch didn't help pain last time but did the first time.    Currently in Pain?  Yes    Pain Score  7     Pain Orientation  Left    Pain Descriptors / Indicators  Burning    Pain Type  Chronic pain    Pain Radiating Towards  hip and knee                       [facility] Adult PT Treatment/Exercise - 02/12/18 0001      Knee/Hip Exercises: Supine   Quad Sets  Strengthening;Left;1 set;10 reps    Quad Sets Limitations  5 sec holds    Bridges  2  sets;5 reps;Limitations    Bridges Limitations  w/ abd set 1st    Straight Leg Raises  10 reps    Other Supine Knee/Hip Exercises  Gluteal sets x 10 5'' holds    Other Supine Knee/Hip Exercises  Ankle pumps x30 to decrease edema       Knee/Hip Exercises: Sidelying   Hip ABduction  Left;10 reps    Clams  Lt 15X5" holds      Modalities   Modalities  Iontophoresis      Iontophoresis   Type of Iontophoresis  Dexamethasone    Location  Lt lateral head of gastroc    Dose  1cc    Time  6 hour take home patch      Manual Therapy   Manual Therapy  Soft tissue mobilization    Manual therapy comments  completed seperately from all other skilled interventions.  Supine with LE's elevated and prior to ionto application    Soft tissue mobilization  left gluteals/piriformis, ITB and left lateral hamstring insertion.             PT Education - 02/12/18 1212    Education Details  Discussed purpose and technique of treatment throughout session, including iontophoresis; advanced HEP.     Person(s) Educated  Patient    Methods  Explanation;Demonstration;Handout    Comprehension  Verbalized understanding;Returned demonstration       PT Short Term Goals - 02/10/18 1213      PT SHORT TERM GOAL #1   Title  Patient will report understanding and report regular compliance with HEP to improve strength, decrease edema, and improve overall functional mobility.     Time  3    Period  Weeks    Status  On-going      PT SHORT TERM GOAL #2   Title  Patient will demonstrate improvement of 1/2 MMT grade in all muscle groups tested as deficient at evaluation in order to improve patient's mechanics with ambulation and improve overall functional mobility.     Time  3    Period  Weeks    Status  On-going      PT SHORT TERM GOAL #3   Title  Patient will report that the pain in her left leg has not exceeded a 5/10 over the course of 1 week period indicating improved tolerance to daily activities.      Time  3    Period  Weeks    Status  On-going        PT Long Term Goals - 02/10/18 1213  PT LONG TERM GOAL #1   Title  Patient will demonstrate improvement of 1 MMT grade in all muscle groups tested as deficient at evaluation in order to improve patient's mechanics with ambulation and improve overall functional mobility.     Time  6    Period  Weeks    Status  On-going      PT LONG TERM GOAL #2   Title  Patient will demonstrate improvement of 10 seconds on the 5xSTS test indicating improved balance and functional strength.     Time  6    Period  Weeks    Status  On-going      PT LONG TERM GOAL #3   Title  Patient will report that the pain in her left leg has not exceeded a 2/10 over the course of 1 week period indicating improved tolerance to daily activities.     Time  6    Period  Weeks    Status  On-going      PT LONG TERM GOAL #4   Title  Patient will demonstrate ability to ambulate at a gait velocity of at least 0.8 m/s on the 2MWT with LRAD indicating improved safety with household and community ambulation.      Time  6    Period  Weeks    Status  On-going            Plan - 02/12/18 1033    Clinical Impression Statement  This session focused on pain decrease, strengthening of abdominals and left LE, and ability to perform exercises, such as supine bridging, without pain increases. Patient responded well to low level core strengthening of abdominal set and was able to perform supine bridges without pain increase this session. Added supine bridges to HEP for additional strengthening between PT sessions. Continuing trial of iontophoresis. Patient quite tender to palpation of left lateral gastroc insertion and gluteals. No tenderness noted in quadratus lumborum this date. Patient exhibits significant muscular weakness in left LE and core. Patient would continue to benefit from skilled OPPT to address the aforementioned deficits.     Rehab Potential  Fair    Clinical  Impairments Affecting Rehab Potential  Positive: Patient motivated; Negative: chronicity of issue    PT Frequency  2x / week    PT Duration  6 weeks    PT Treatment/Interventions  ADLs/Self Care Home Management;Aquatic Therapy;Cryotherapy;Electrical Stimulation;Iontophoresis 4mg /ml Dexamethasone;DME Instruction;Gait training;Stair training;Functional mobility training;Therapeutic activities;Therapeutic exercise;Balance training;Neuromuscular re-education;Patient/family education;Manual techniques;Passive range of motion;Dry needling;Energy conservation;Taping    PT Next Visit Plan  Continue iontophoresis for 4 consecutive times (This session was 3/4). Review HEP including supine exercises for lower extremity strengthening, ankle pumps and supine bridges.  continue manual techniques to reduce edema and pain.   Begin standing hip and knee strengthening exercises if pain reduced next session.     PT Home Exercise Plan  02/12/18 - supine bridge added to SLR flexion/abduction and side-lying clams    Consulted and Agree with Plan of Care  Patient       Patient will benefit from skilled therapeutic intervention in order to improve the following deficits and impairments:  Abnormal gait, Decreased balance, Decreased endurance, Difficulty walking, Increased edema, Decreased activity tolerance, Decreased strength, Pain  Visit Diagnosis: Pain in left leg  Localized edema  Other abnormalities of gait and mobility  Other symptoms and signs involving the musculoskeletal system     Problem List Patient Active Problem List   Diagnosis Date Noted  . Seasonal  and perennial allergic rhinitis 03/06/2017  . Moderate persistent asthma, uncomplicated 75/64/3329  . Chronic nonseasonal allergic rhinitis due to fungal spores 12/02/2016  . Chronic urticaria 12/02/2016  . Anaphylactic shock due to adverse food reaction 12/02/2016  . History of colonic polyps   . Diverticulosis of colon without hemorrhage   .  Asthma, mild intermittent 08/30/2015  . Encounter for screening colonoscopy 08/25/2015  . Wheezing 08/08/2015  . Gastroesophageal reflux disease 08/08/2015  . Morbid obesity ([place]) 07/14/2013  . Status post right knee replacement 06/17/2011 06/17/2013  . Status post left knee replacement 02/28/2007 06/17/2013  . DDD (degenerative disc disease), lumbosacral 10/06/2012  . Hip pain 10/06/2012  . Radicular leg pain 10/06/2012  . Bursitis of hip, right 06/25/2012  . Trochanteric bursitis of both hips 09/11/2011  . Left knee pain 03/20/2011  . OA (osteoarthritis) of knee 12/12/2010  . Mononeuritis of leg 10/24/2010  . Chronic pain 10/24/2010  . OTHER POSTSURGICAL STATUS OTHER 07/24/2010  . STRESS FRACTURE, TIBIA 04/11/2010  . TENOSYNOVITIS OF FOOT AND ANKLE 02/15/2010  . LOOSE BODY-KNEE 01/10/2010  . DERANGEMENT MENISCUS 01/01/2010  . [place] HEAD 01/01/2010  . SHOULDER PAIN 04/19/2009  . IMPINGEMENT SYNDROME 04/19/2009  . HERNIATED LUMBOSACRAL DISC 09/01/2007  . UNSPECIFIED NEURALGIA NEURITIS AND RADICULITIS 07/09/2007  . TOTAL KNEE FOLLOW-UP 06/25/2007  . Osteoarthrosis, unspecified whether generalized or localized, lower leg 03/09/2007  . KNEE PAIN 02/02/2007  . TEAR MEDIAL MENISCUS 02/02/2007    Floria Raveling. Hartnett-Rands, MS, PT Per [facility] #[id number] 02/12/2018, 12:19 PM  [place] [address] [place], [place], [address] Phone: [phone]   Fax:  [phone]  Name: TATYANA BIBER MRN: [id number] Date of Birth: Nov 19, 1955

## 2018-02-12 NOTE — Patient Instructions (Signed)
Flexors, Supine Bridge    Lie supine, feet shoulder-width apart. Lift hips toward ceiling. Hold _2-5__ seconds. Repeat 5-10___ times per session. Do 2 sets Do _1__ sessions per day.  Copyright  [employer]. All rights reserved.

## 2018-02-17 ENCOUNTER — Telehealth (HOSPITAL_COMMUNITY): Payer: Self-pay | Admitting: Physical Therapy

## 2018-02-17 ENCOUNTER — Ambulatory Visit (HOSPITAL_COMMUNITY): Payer: PPO | Admitting: Physical Therapy

## 2018-02-17 NOTE — Telephone Encounter (Signed)
Therapist called regarding patient not showing up for appointment scheduled for 11:15 this morning. Left message stating hoped that patient was doing well and reminded her of next scheduled appointment as well as how to contact the clinic if she is unable to attend future appointments.   Clarene Critchley PT, DPT 11:59 AM, 02/17/18 [phone]

## 2018-02-19 ENCOUNTER — Ambulatory Visit (HOSPITAL_COMMUNITY): Payer: PPO | Admitting: Physical Therapy

## 2018-02-19 ENCOUNTER — Telehealth (HOSPITAL_COMMUNITY): Payer: Self-pay | Admitting: Family Medicine

## 2018-02-19 NOTE — Telephone Encounter (Signed)
02/19/18  pt called to cx said that she was in too much pain to come today

## 2018-02-22 ENCOUNTER — Other Ambulatory Visit: Payer: Self-pay | Admitting: Family Medicine

## 2018-02-24 ENCOUNTER — Telehealth (HOSPITAL_COMMUNITY): Payer: Self-pay | Admitting: Family Medicine

## 2018-02-24 ENCOUNTER — Ambulatory Visit (HOSPITAL_COMMUNITY): Payer: PPO | Admitting: Physical Therapy

## 2018-02-24 NOTE — Telephone Encounter (Signed)
10/1 cx via the phone tree

## 2018-03-03 ENCOUNTER — Encounter (HOSPITAL_COMMUNITY): Payer: Self-pay | Admitting: Physical Therapy

## 2018-03-03 ENCOUNTER — Ambulatory Visit (HOSPITAL_COMMUNITY): Payer: PPO | Attending: Orthopedic Surgery | Admitting: Physical Therapy

## 2018-03-03 DIAGNOSIS — R29898 Other symptoms and signs involving the musculoskeletal system: Secondary | ICD-10-CM | POA: Diagnosis not present

## 2018-03-03 DIAGNOSIS — M79605 Pain in left leg: Secondary | ICD-10-CM | POA: Diagnosis not present

## 2018-03-03 DIAGNOSIS — R2689 Other abnormalities of gait and mobility: Secondary | ICD-10-CM | POA: Diagnosis not present

## 2018-03-03 DIAGNOSIS — R600 Localized edema: Secondary | ICD-10-CM | POA: Diagnosis not present

## 2018-03-03 NOTE — Therapy (Signed)
[place], [place], [address] Phone: [phone]   Fax:  [phone]  Physical Therapy Treatment / Re-assessment / Discharge summary  Patient Details  Name: Sharon [name] MRN: [id number] Date of Birth: December 15, 1955 Referring Provider (PT): Arther Abbott, MD   Encounter Date: 03/03/2018   PHYSICAL THERAPY DISCHARGE SUMMARY  Visits from Start of Care: 5  Current functional level related to goals / functional outcomes: See below   Remaining deficits: See below   Education / Equipment: Educated on initial HEP, at discharge visit educated patient to contact physician and return for further follow-up.  Plan: Patient agrees to discharge.  Patient goals were not met. Patient is being discharged due to lack of progress.  ?????           PT End of Session - 03/03/18 1121    Visit Number  5    Number of Visits  13    Date for PT Re-Evaluation  03/13/18   Mini re-assess 02/19/18   Authorization Type  [employer]    Authorization Time Period  01/29/18 - 03/13/18    Authorization - Visit Number  5    Authorization - Number of Visits  10    PT Start Time  1120    PT Stop Time  1150   Some time unbilled due to re-assessment and discharge   PT Time Calculation (min)  30 min    Equipment Utilized During Treatment  Gait belt    Activity Tolerance  Patient tolerated treatment well    Behavior During Therapy  WFL for tasks assessed/performed       Past Medical History:  Diagnosis Date  . Biliary colic   . Carpal tunnel syndrome    bilateral  . Chronic allergic rhinitis   . Chronic urticaria   . Complication of anesthesia    confusion for a couple of days after waking after having multiple knee surgery's, does well with spinal  . Degenerative disk disease   . Diverticulosis of colon   . Fatty liver   . Fibrocystic breast disease 1996  . GERD (gastroesophageal reflux disease)   . History of  colon polyps    HYPERPLASTIC 09-14-2015  . History of kidney stones    passed on own  . History of melanoma excision    left thigh ,inside 2009--- per pt also has had severeal excision of office for almost melanoma  . Migraine    since age 65 not as frequent now  . Moderate persistent asthma, uncomplicated    followed by dr gallagher (cone allergy and asthma)  . OA (osteoarthritis)    all joints  . SUI (stress urinary incontinence, female)   . Wears glasses     Past Surgical History:  Procedure Laterality Date  . APPENDECTOMY  1973  . BREAST EXCISIONAL BIOPSY Right 1994;  10-01-2009   2011 per path mild ductal hyperplasia/ fibrocystic breast  . CHOLECYSTECTOMY N/A 01/30/2017   Procedure: LAPAROSCOPIC CHOLECYSTECTOMY;  Surgeon: Kieth Brightly Arta Bruce, MD;  Location: [facility];  Service: General;  Laterality: N/A;  . COLONOSCOPY N/A 09/14/2015   Procedure: COLONOSCOPY;  Surgeon: Daneil Dolin, MD;  Location: [facility];  Service: Endoscopy;  Laterality: N/A;  930 - moved to 9:15 - office to notify  . ESOPHAGOGASTRODUODENOSCOPY (EGD) WITH PROPOFOL N/A 11/29/2016   Procedure: ESOPHAGOGASTRODUODENOSCOPY (EGD) WITH PROPOFOL;  Surgeon: Doran Stabler, MD;  Location: [facility];  Service: Gastroenterology;  Laterality: N/A;  . KNEE ARTHROSCOPY W/ MENISCECTOMY Bilateral left 12-19-2006;  right 01-26-2010  . TONSILLECTOMY  1959  . TOTAL KNEE ARTHROPLASTY Left 03/10/2007  . TOTAL KNEE ARTHROPLASTY  06/17/2011   Procedure: TOTAL KNEE ARTHROPLASTY;  Surgeon: Arther Abbott, MD;  Location: [facility];  Service: Orthopedics;  Laterality: Right;  [place]  . TOTAL KNEE REVISION Left 07/08/2010  (spinal anesthesia)  . TUBAL LIGATION Bilateral YRS AGO  . VAGINAL HYSTERECTOMY  1988    There were no vitals filed for this visit.  Subjective Assessment - 03/03/18 1120    Subjective  Patient reported that the pain in her left leg is a 7/10. She stated she fell again 2 Fridays ago.      How long can you stand comfortably?  10 minutes (was 20 minutes)    How long can you walk comfortably?  5 minutes (was 15)    Currently in Pain?  Yes    Pain Score  7     Pain Location  Leg    Pain Orientation  Left    Pain Descriptors / Indicators  Burning;Aching    Pain Type  Chronic pain         [facility] PT Assessment - 03/03/18 0001      Assessment   Medical Diagnosis  Left Calf Pain    Referring Provider (PT)  Arther Abbott, MD    Onset Date/Surgical Date  --   June 10, 2017   Next MD Visit  --   Later October   Prior Therapy  Yes, for both knee replacements      Balance Screen   Has the patient fallen in the past 6 months  Yes    How many times?  --   More than [place] residence    Living Arrangements  Alone    Type of [place] to enter    Entrance Stairs-Number of Steps  3    Entrance Stairs-Rails  None    Home Layout  Able to live on main level with bedroom/bathroom    Home Equipment  [place] - single point;Walker - 4 wheels;Shower seat      Prior Function   Level of Independence  Independent;Independent with basic ADLs      Cognition   Overall Cognitive Status  Within Functional Limits for tasks assessed      Observation/Other Assessments   Focus on Therapeutic Outcomes (FOTO)   25% (was 36%)      Circumferential Edema   Circumferential - Right  46 cm   was 45 cm   Circumferential - Left   55 cm   was 55 cm     Strength   Right Hip Flexion  4/5   was 4+   Right Hip ABduction  4/5   was 4/5   Left Hip Flexion  4/5   was 4   Left Hip ABduction  3-/5   was 3/5   Right Knee Flexion  4+/5   was 5   Right Knee Extension  4+/5   was 5   Left Knee Flexion  4+/5   was 4+   Left Knee Extension  4+/5   was 4+   Right Ankle Dorsiflexion  5/5   was 5   Left Ankle Dorsiflexion  5/5   was 5     Palpation   Palpation comment  Patient indicated  pain at lateral aspect of left  popliteal fossa      Special Tests    Special Tests  Hip Special Tests    Hip Special Tests   Hip Scouring      Other   Findings  --    side  --    comment  --      Hip Scouring   Findings  Unable to test    Side  Left    Comments  Painful getting into test position      Ambulation/Gait   Ambulation Distance (Feet)  170 Feet   was 220   Assistive device  4-wheeled walker    Gait Pattern  Decreased stance time - left;Trendelenburg;Antalgic    Gait velocity  0.43 m/s      Standardized Balance Assessment   Standardized Balance Assessment  Five Times Sit to Stand    Five times sit to stand comments   33.63 seconds                    [facility] Adult PT Treatment/Exercise - 03/03/18 0001      Iontophoresis   Type of Iontophoresis  Dexamethasone    Location  Lt lateral head of gastroc    Dose  1cc    Time  6 hour take home patch               PT Short Term Goals - 03/03/18 1201      PT SHORT TERM GOAL #1   Title  Patient will report understanding and report regular compliance with HEP to improve strength, decrease edema, and improve overall functional mobility.     Baseline  03/03/18: Patient having significant pain and unable.     Time  3    Period  Weeks    Status  On-going      PT SHORT TERM GOAL #2   Title  Patient will demonstrate improvement of 1/2 MMT grade in all muscle groups tested as deficient at evaluation in order to improve patient's mechanics with ambulation and improve overall functional mobility.     Baseline  03/03/18: Patient did not make improvements in strength and some areas decreased in strength. See MMT.     Time  3    Period  Weeks    Status  On-going      PT SHORT TERM GOAL #3   Title  Patient will report that the pain in her left leg has not exceeded a 5/10 over the course of 1 week period indicating improved tolerance to daily activities.     Baseline  03/03/18: Patient reported 7/10 pain at this session.     Time  3     Period  Weeks    Status  On-going        PT Long Term Goals - 03/03/18 1203      PT LONG TERM GOAL #1   Title  Patient will demonstrate improvement of 1 MMT grade in all muscle groups tested as deficient at evaluation in order to improve patient's mechanics with ambulation and improve overall functional mobility.     Baseline  03/03/18: Patient did not make improvements in strength and some areas decreased in strength. See MMT.     Time  6    Period  Weeks    Status  On-going      PT LONG TERM GOAL #2   Title  Patient will demonstrate improvement of 10 seconds on the 5xSTS  test indicating improved balance and functional strength.     Baseline  03/03/18: Patient's time on the 5xSTS increased.     Time  6    Period  Weeks    Status  On-going      PT LONG TERM GOAL #3   Title  Patient will report that the pain in her left leg has not exceeded a 2/10 over the course of 1 week period indicating improved tolerance to daily activities.     Baseline  03/03/18: Patient reported a 7/10 pain this session.     Time  6    Period  Weeks    Status  On-going      PT LONG TERM GOAL #4   Title  Patient will demonstrate ability to ambulate at a gait velocity of at least 0.8 m/s on the 2MWT with LRAD indicating improved safety with household and community ambulation.      Baseline  03/03/18: Patient's gait velocity was found to be 0.43 m/s this session with 4 wheeled walker.     Time  6    Period  Weeks    Status  On-going            Plan - 03/03/18 1213    Clinical Impression Statement  This session performed a re-assessment of patient's progress towards goals. Patient had not met any short or long term goals and had actually regressed in some areas. She had reported a recent fall. With attempt to test patient's left hip with scouring test patient reported a high level of pain and was unable to attain position. Ended session with placement of the last of 4 recommended dexamethasone patches this  session with instructions to remove the patch after 6 hours and to remove the patch if any irritation occurs. Patient is being discharged from physical therapy as patient has not made progress with therapy and with the recommendation to return to her physician as soon as possible for follow-up on possible causes of pain and loss of balance. Patient agreed to plan.     Rehab Potential  Fair    Clinical Impairments Affecting Rehab Potential  Positive: Patient motivated; Negative: chronicity of issue    PT Frequency  2x / week    PT Duration  6 weeks    PT Treatment/Interventions  ADLs/Self Care Home Management;Aquatic Therapy;Cryotherapy;Electrical Stimulation;Iontophoresis 38m/ml Dexamethasone;DME Instruction;Gait training;Stair training;Functional mobility training;Therapeutic activities;Therapeutic exercise;Balance training;Neuromuscular re-education;Patient/family education;Manual techniques;Passive range of motion;Dry needling;Energy conservation;Taping    PT Next Visit Plan  Discharged    PT Home Exercise Plan  02/12/18 - supine bridge added to SLR flexion/abduction and side-lying clams    Consulted and Agree with Plan of Care  Patient       Patient will benefit from skilled therapeutic intervention in order to improve the following deficits and impairments:  Abnormal gait, Decreased balance, Decreased endurance, Difficulty walking, Increased edema, Decreased activity tolerance, Decreased strength, Pain  Visit Diagnosis: Pain in left leg  Localized edema  Other abnormalities of gait and mobility  Other symptoms and signs involving the musculoskeletal system     Problem List Patient Active Problem List   Diagnosis Date Noted  . Seasonal and perennial allergic rhinitis 03/06/2017  . Moderate persistent asthma, uncomplicated 056/38/7564 . Chronic nonseasonal allergic rhinitis due to fungal spores 12/02/2016  . Chronic urticaria 12/02/2016  . Anaphylactic shock due to adverse food  reaction 12/02/2016  . History of colonic polyps   . Diverticulosis of colon without hemorrhage   .  Asthma, mild intermittent 08/30/2015  . Encounter for screening colonoscopy 08/25/2015  . Wheezing 08/08/2015  . Gastroesophageal reflux disease 08/08/2015  . Morbid obesity ([place]) 07/14/2013  . Status post right knee replacement 06/17/2011 06/17/2013  . Status post left knee replacement 02/28/2007 06/17/2013  . DDD (degenerative disc disease), lumbosacral 10/06/2012  . Hip pain 10/06/2012  . Radicular leg pain 10/06/2012  . Bursitis of hip, right 06/25/2012  . Trochanteric bursitis of both hips 09/11/2011  . Left knee pain 03/20/2011  . OA (osteoarthritis) of knee 12/12/2010  . Mononeuritis of leg 10/24/2010  . Chronic pain 10/24/2010  . OTHER POSTSURGICAL STATUS OTHER 07/24/2010  . STRESS FRACTURE, TIBIA 04/11/2010  . TENOSYNOVITIS OF FOOT AND ANKLE 02/15/2010  . LOOSE BODY-KNEE 01/10/2010  . DERANGEMENT MENISCUS 01/01/2010  . [place] HEAD 01/01/2010  . SHOULDER PAIN 04/19/2009  . IMPINGEMENT SYNDROME 04/19/2009  . HERNIATED LUMBOSACRAL DISC 09/01/2007  . UNSPECIFIED NEURALGIA NEURITIS AND RADICULITIS 07/09/2007  . TOTAL KNEE FOLLOW-UP 06/25/2007  . Osteoarthrosis, unspecified whether generalized or localized, lower leg 03/09/2007  . KNEE PAIN 02/02/2007  . TEAR MEDIAL MENISCUS 02/02/2007   Clarene Critchley PT, DPT 12:16 PM, 03/03/18 [place], [place], [address] Phone: [phone]   Fax:  [phone]  Name: Sharon [name] MRN: [id number] Date of Birth: 04-01-1956

## 2018-03-05 ENCOUNTER — Encounter (HOSPITAL_COMMUNITY): Payer: PPO | Admitting: Physical Therapy

## 2018-03-10 ENCOUNTER — Encounter (HOSPITAL_COMMUNITY): Payer: PPO | Admitting: Physical Therapy

## 2018-03-12 ENCOUNTER — Encounter (HOSPITAL_COMMUNITY): Payer: PPO | Admitting: Physical Therapy

## 2018-03-17 ENCOUNTER — Encounter (HOSPITAL_COMMUNITY): Payer: PPO | Admitting: Physical Therapy

## 2018-03-23 ENCOUNTER — Ambulatory Visit: Payer: PPO | Admitting: Orthopedic Surgery

## 2018-03-23 VITALS — BP 143/86 | HR 80 | Ht 60.0 in | Wt 275.0 lb

## 2018-03-23 DIAGNOSIS — G8929 Other chronic pain: Secondary | ICD-10-CM | POA: Diagnosis not present

## 2018-03-23 DIAGNOSIS — M792 Neuralgia and neuritis, unspecified: Secondary | ICD-10-CM | POA: Diagnosis not present

## 2018-03-23 DIAGNOSIS — M5442 Lumbago with sciatica, left side: Secondary | ICD-10-CM | POA: Diagnosis not present

## 2018-03-23 DIAGNOSIS — Z96652 Presence of left artificial knee joint: Secondary | ICD-10-CM | POA: Diagnosis not present

## 2018-03-23 NOTE — Patient Instructions (Signed)
You will get a call from [facility] with the Neurosurgeon appointment for your low back  You will get a call from Dr Damita Dunnings office regarding second opinion for your left knee he is at [facility] in [place].

## 2018-03-23 NOTE — Progress Notes (Signed)
Progress Note   Patient ID: Sharon [name], female   DOB: 02-Feb-1956, 62 y.o.   MRN: [id number]   Chief Complaint  Patient presents with  . Leg Pain    Left leg pain no better    HPI 2 problems with Sharon [name] right now.  When she has left hip pain which she describes but points to her lower back and she has left lateral leg pain which is most likely coming from her lower back  The second problem is continued pain in the anterior portion of her knee which is been a consistent problem for her and did not respond to physical therapy  ROS Does not really describe numbness or tingling lower back pain  Allergies  Allergen Reactions  . 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 Adhesive [Tape] Hives and Dermatitis    Can tolerate tegaderm  . Augmentin [Amoxicillin-Pot Clavulanate] Diarrhea and Nausea Only  . Latex Hives  . Neomycin-Bacitracin Zn-Polymyx Dermatitis    NEOSPORIN     BP (!) 143/86   Pulse 80   Ht 5' (1.524 m)   Wt 275 lb (124.7 kg)   BMI 53.71 kg/m   Physical Exam  Constitutional: She is oriented to person, place, and time. She appears well-developed and well-nourished.  Neurological: She is alert and oriented to person, place, and time.  Psychiatric: She has a normal mood and affect. Judgment normal.  Vitals reviewed.  She is tender in the lower back actually on the left side in the midline she is also tender in the left lateral anterior compartment at L5 dermatome  She is tender in her left knee she has full extension if not hyperextension and then she has flexion of about 115 degrees is difficult to tell because of her leg size her leg feels very stable on anterior posterior drawer test as well as collateral ligament testing.  Medical decisions:   Data  Imaging:   Performed  Encounter Diagnoses  Name Primary?  . 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 Neurogenic pain, leg, left   . Chronic bilateral low  back pain with left-sided sciatica     PLAN:   I am going to get a second opinion about her knee  I would like her to see a back specialist there is nothing else I can do for her back try therapy injection medication it is really out of my level of expertise   Arther Abbott, MD 03/23/2018 9:59 AM

## 2018-04-02 DIAGNOSIS — G8929 Other chronic pain: Secondary | ICD-10-CM | POA: Diagnosis not present

## 2018-04-02 DIAGNOSIS — R030 Elevated blood-pressure reading, without diagnosis of hypertension: Secondary | ICD-10-CM | POA: Diagnosis not present

## 2018-04-02 DIAGNOSIS — M25552 Pain in left hip: Secondary | ICD-10-CM | POA: Diagnosis not present

## 2018-04-02 DIAGNOSIS — M25562 Pain in left knee: Secondary | ICD-10-CM | POA: Diagnosis not present

## 2018-04-09 DIAGNOSIS — M25552 Pain in left hip: Secondary | ICD-10-CM | POA: Diagnosis not present

## 2018-04-09 DIAGNOSIS — M25572 Pain in left ankle and joints of left foot: Secondary | ICD-10-CM | POA: Diagnosis not present

## 2018-04-10 ENCOUNTER — Telehealth: Payer: Self-pay | Admitting: Orthopedic Surgery

## 2018-04-10 NOTE — Telephone Encounter (Signed)
BRING HER IN FOR ME TO TAKE MY OWN HIP FILMS WITH PELVIS

## 2018-04-10 NOTE — Telephone Encounter (Signed)
Patient called stating she needs to speak with Dr. Aline Brochure in regards to her referral appointment with Dr. Mayer Camel. Stated she was told she needed a hip replacement and stated that the doctor talked like he was going to do the procedure. I told her that Dr. Aline Brochure would need to get the notes from Dr. Mayer Camel to go over. I told her as soon as our office gets the notes we will give her a call about an appointment with Dr. Aline Brochure. She stated she wanted Dr. Aline Brochure to do the surgery if possible and I told her that I understood. I told her that Ii would be sure Dr. Aline Brochure was aware of her call and concern.

## 2018-04-13 NOTE — Telephone Encounter (Signed)
Appointment scheduled.  Patient aware. 

## 2018-04-13 NOTE — Telephone Encounter (Signed)
Called patient to offer appointment per Dr Ruthe Mannan response. Left voice message to return call.

## 2018-04-22 ENCOUNTER — Ambulatory Visit (INDEPENDENT_AMBULATORY_CARE_PROVIDER_SITE_OTHER): Payer: PPO

## 2018-04-22 ENCOUNTER — Encounter: Payer: Self-pay | Admitting: Orthopedic Surgery

## 2018-04-22 ENCOUNTER — Ambulatory Visit: Payer: PPO | Admitting: Orthopedic Surgery

## 2018-04-22 VITALS — BP 110/80 | HR 85 | Ht 60.0 in | Wt 271.0 lb

## 2018-04-22 DIAGNOSIS — M25552 Pain in left hip: Secondary | ICD-10-CM

## 2018-04-22 DIAGNOSIS — M1612 Unilateral primary osteoarthritis, left hip: Secondary | ICD-10-CM

## 2018-04-22 DIAGNOSIS — M5442 Lumbago with sciatica, left side: Secondary | ICD-10-CM

## 2018-04-22 DIAGNOSIS — Z96652 Presence of left artificial knee joint: Secondary | ICD-10-CM | POA: Diagnosis not present

## 2018-04-22 DIAGNOSIS — G8929 Other chronic pain: Secondary | ICD-10-CM | POA: Diagnosis not present

## 2018-04-22 DIAGNOSIS — Z96651 Presence of right artificial knee joint: Secondary | ICD-10-CM | POA: Diagnosis not present

## 2018-04-22 NOTE — Progress Notes (Signed)
Progress Note   Patient ID: Sharon [name], female   DOB: 09-09-1955, 62 y.o.   MRN: [id number]   Chief Complaint  Patient presents with  . Hip Pain    Left hip     Pain of left hip joint  (primary encounter diagnosis) Arthritis of left hip Status post left knee replacement 02/28/2007 Chronic bilateral low back pain with left-sided sciatica Status post right knee replacement 06/17/2011  Cadence went to see Dr. Mayer [name] regarding her left knee as a second opinion for ongoing left knee pain status post left knee replacement with underlying diagnoses as listed above.  He took x-rays of her hip but found that she has end-stage arthritis of the left hip and recommended a hip replacement but her BMI is in the range of 50 and of course that made her not a candidate for hip replacement surgery at this time.  She is already been to bariatric clinic she is concerned that bariatric surgery will render her unable to take NSAIDs for her other arthritic joints and she is known several people who put the weight back on  She comes in today for my opinion regarding the newly found diagnosis of left hip arthritis.  Again Sharon [name] complains of pain in her left leg it runs from her left hip/lower back down into her left lateral leg including pain near the trochanter iliac crest and when asked today she says yes I do have groin pain.  She does have loss of functional activities such as getting out of a chair easily climbing steps and sitting down and getting back up.  Her pain which is moderate to severe at times is controlled by Vicodin.  I do have Dr. Damita [name] notes and I will have them scanned into the medical record  For ease of retrieval I will read his assessment and plan  I believe her primary pain generators end-stage arthritis left hip.  Her total knees appear to be in good shape with no evidence of loosening although the bearing on the left total knee appears to be at least 15 mm thick.  Plan patient will  need to be optimized for the left total hip which she will need to have done.  She weighs 270 pounds at this time needs to slim down to around 200 pounds for surgical intervention.  We have given her a bariatric referral although she states she is seeing the bariatric clinic at [facility] in the past.  Regarding pain control she is presently taking Vicodin will have to continue to get that from her primary care physician or pain management physician    Review of Systems  Musculoskeletal: Positive for joint pain.       Feeling that she may fall at any time  Neurological: Positive for weakness.     Allergies  Allergen Reactions  . 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 Adhesive [Tape] Hives and Dermatitis    Can tolerate tegaderm  . Augmentin [Amoxicillin-Pot Clavulanate] Diarrhea and Nausea Only  . Latex Hives  . Neomycin-Bacitracin Zn-Polymyx Dermatitis    NEOSPORIN     BP 110/80   Pulse 85   Ht 5' (1.524 m)   Wt 271 lb (122.9 kg)   BMI 52.93 kg/m   Physical Exam  Constitutional: She is oriented to person, place, and time. She appears well-developed and well-nourished.  Neurological: She is alert and oriented to person, place, and time.  Psychiatric: She has  a normal mood and affect. Judgment normal.  Vitals reviewed.  The patient has a severely labored gait she walks with a walker she leans forward she seems to be in the grocery cart sign position with poor get up and go testing.   Medical decisions:   Data  Imaging:   I took x-rays in our office today and because of the patient's weight and size they came out somewhat underpenetrated however  You can see she has bilateral hip arthritis she does have decreased joint space on the sore seal area of the hip I would grade this as [place] grade 1  Encounter Diagnoses  Name Primary?  . Pain of left hip joint Yes  . Arthritis of left hip   . Status post left knee replacement  02/28/2007   . Chronic bilateral low back pain with left-sided sciatica   . Status post right knee replacement 06/17/2011     PLAN:   Left knee replacement stable Right knee replacement stable  Pain left hip, left hip arthritis and chronic lower back pain with left-sided sciatica:  I still think she has a lot of lumbar issues here.  Groin pain has not been a major factor in her complaints throughout the last 2 years that I have seen her.  I do agree that she does have arthritis of the hip with joint space narrowing but I would not graded as severe arthritis and without significant groin and thigh pain.  I would think that most of the pain is coming from the lumbar spine condition  In any event she does weigh 270 pounds BMI is 52 and 2 high for hip replacement surgery  I did advise her to go ahead with the bariatric procedure because most patients in her condition will be limited in their NSAID intake after surgery but once the joints have been repaired the need for NSAID therapy should be very little or minimal  She wants to try nutrition we suggested the pool she will let us know     Sharon Abbott, MD 04/22/2018 3:33 PM

## 2018-05-04 ENCOUNTER — Encounter: Payer: Self-pay | Admitting: Family Medicine

## 2018-05-04 ENCOUNTER — Ambulatory Visit (INDEPENDENT_AMBULATORY_CARE_PROVIDER_SITE_OTHER): Payer: PPO | Admitting: Family Medicine

## 2018-05-04 VITALS — BP 140/88 | Ht 60.0 in | Wt 269.0 lb

## 2018-05-04 DIAGNOSIS — Z6841 Body Mass Index (BMI) 40.0 and over, adult: Secondary | ICD-10-CM

## 2018-05-04 DIAGNOSIS — J4521 Mild intermittent asthma with (acute) exacerbation: Secondary | ICD-10-CM | POA: Diagnosis not present

## 2018-05-04 DIAGNOSIS — G894 Chronic pain syndrome: Secondary | ICD-10-CM | POA: Diagnosis not present

## 2018-05-04 MED ORDER — HYDROCODONE-ACETAMINOPHEN 10-325 MG PO TABS: 1.0000 | ORAL_TABLET | Freq: Two times a day (BID) | ORAL | 0 refills | Status: DC | PRN

## 2018-05-04 NOTE — Progress Notes (Signed)
   Subjective:    Patient ID: Sharon [name], female    DOB: [birth year], 62 y.o.   MRN: [id number] Patient arrives for discussion of multiple concerns HPI This patient was seen today for chronic pain  The medication list was reviewed and updated.   -Compliance with medication: takes when needed  - Number patient states they take daily: takes only as needed  -when was the last dose patient took? Last night around 10:30pm  The patient was advised the importance of maintaining medication and not using illegal substances with these.  Here for refills and follow up  The patient was educated that we can provide 3 monthly scripts for their medication, it is their responsibility to follow the instructions.  Side effects or complications from medications: none  Patient is aware that pain medications are meant to minimize the severity of the pain to allow their pain levels to improve to allow for better function. They are aware of that pain medications cannot totally remove their pain.  Due for UDT ( at least once per year) :   Pt was given 3 scripts at last visit and still has one script to fill plus 6 pills in bottle.    Impression: Chronic pain. Patient compliant with medication. No substantial side effects. [place] controlled substance registry reviewed to ensure compliance and proper use of medication. Patient aware goal of medicine is not complete resolution of pain but to control his symptoms to improve his functional capacity. Aware of potential adverse side effects  Asthma overall stable, no sig use of albuterol  Off the hronic meds  Morbid obesity, pt unabe to take arthritis meds afte any thing done, therefore pt does not want and feels cannot do procedure   Has bi time arthritis in the hip, and needs surg, but told needs to lose 60 pounds     Review of Systems No headache, no major weight loss or weight gain, no chest pain no back pain abdominal pain no change in  bowel habits complete ROS otherwise negative     Objective:   Physical Exam Alert and oriented, vitals reviewed and stable, NAD ENT-TM's and ext canals WNL bilat via otoscopic exam Soft palate, tonsils and post pharynx WNL via oropharyngeal exam Neck-symmetric, no masses; thyroid nonpalpable and nontender Pulmonary-no tachypnea or accessory muscle use; Clear without wheezes via auscultation Card--no abnrml murmurs, rhythm reg and rate WNL Carotid pulses symmetric, without bruits Using walker/hip very painful with rotation       Assessment & Plan:  Impression 1 chronic pain  Impression: Chronic pain. Patient compliant with medication. No substantial side effects. [place] controlled substance registry reviewed to ensure compliance and proper use of medication. Patient aware goal of medicine is not complete resolution of pain but to control his symptoms to improve his functional capacity. Aware of potential adverse side effects  #2 morbid obesity.  BMI of 52.  Diet and exercise discussed.  Exercise compromise due to #3.  Patient has been to a bariatric session and not interested in that because of need to stop arthritis medicines after bariatric intervention  3.  Severe left hip arthritis.  Patient needs surgery but needs to also lose substantial weight  4.  Asthma mild intermittent in nature  Pain medicines written.  Follow-up in 3 months  Greater than 50% of this 25 minute face to face visit was spent in counseling and discussion and coordination of care regarding the above diagnosis/diagnosies

## 2018-05-31 ENCOUNTER — Other Ambulatory Visit: Payer: Self-pay | Admitting: Family Medicine

## 2018-07-14 ENCOUNTER — Ambulatory Visit (INDEPENDENT_AMBULATORY_CARE_PROVIDER_SITE_OTHER): Payer: PPO | Admitting: Family Medicine

## 2018-07-14 ENCOUNTER — Encounter: Payer: Self-pay | Admitting: Family Medicine

## 2018-07-14 VITALS — BP 122/88 | Temp 98.9°F | Ht 60.0 in | Wt 264.0 lb

## 2018-07-14 DIAGNOSIS — M79605 Pain in left leg: Secondary | ICD-10-CM | POA: Diagnosis not present

## 2018-07-14 DIAGNOSIS — S8012XA Contusion of left lower leg, initial encounter: Secondary | ICD-10-CM

## 2018-07-14 NOTE — Progress Notes (Signed)
   Subjective:    Patient ID: Sharon [name], female    DOB: 08-04-1955, 63 y.o.   MRN: [id number]  HPI  Patient is here today with complaints of left leg pain after fall two weeks ago.  The pain started a week ago.She states at the bruise site was hot and warm to touch with a knot under the skin.  Patient was concerned about potential for a clot.  No chest pain no shortness of breath  She has been taking her Hydrocodone 10-325 mg prn.  Review of Systems No fever no chills    Objective:   Physical Exam Alert vitals stable, NAD. Blood pressure good on repeat. HEENT normal. Lungs clear. Heart regular rate and rhythm. Hematoma evident left anterior leg.  Central firm hard nodule somewhat tender nodule palpated/no ankle pain      Assessment & Plan:  Impression organizing hematoma with central nodule.  This hematoma is extravascular.  Negative Homans sign.  Negative calf tenderness.  Tenderness at nodule which is extravascular symptom care discussed.  No need for DVT work-up.  Petra Kuba of an organizing hematoma with often residual central tender nodule discussed

## 2018-07-20 ENCOUNTER — Other Ambulatory Visit: Payer: Self-pay | Admitting: Family Medicine

## 2018-08-03 ENCOUNTER — Ambulatory Visit (INDEPENDENT_AMBULATORY_CARE_PROVIDER_SITE_OTHER): Payer: PPO | Admitting: Family Medicine

## 2018-08-03 ENCOUNTER — Encounter: Payer: Self-pay | Admitting: Family Medicine

## 2018-08-03 ENCOUNTER — Other Ambulatory Visit: Payer: Self-pay | Admitting: *Deleted

## 2018-08-03 VITALS — BP 132/74 | Ht 60.0 in | Wt 266.0 lb

## 2018-08-03 DIAGNOSIS — J4540 Moderate persistent asthma, uncomplicated: Secondary | ICD-10-CM | POA: Diagnosis not present

## 2018-08-03 DIAGNOSIS — G894 Chronic pain syndrome: Secondary | ICD-10-CM | POA: Diagnosis not present

## 2018-08-03 DIAGNOSIS — Z79891 Long term (current) use of opiate analgesic: Secondary | ICD-10-CM | POA: Diagnosis not present

## 2018-08-03 NOTE — Progress Notes (Signed)
   Subjective:    Patient ID: Sharon [name], female    DOB: Aug 07, 1955, 62 y.o.   MRN: [id number]  HPI This patient was seen today for chronic pain.  Takes for bilateral hip pain. Needs left hip replacement but needs to lose 50 more pounds before they will do surgery.   The medication list was reviewed and updated.   -Compliance with medication: takes one daily  - Number patient states they take daily: one daily  -when was the last dose patient took? Last night  The patient was advised the importance of maintaining medication and not using illegal substances with these.  Here for refills and follow up  The patient was educated that we can provide 3 monthly scripts for their medication, it is their responsibility to follow the instructions.  Side effects or complications from medications: none  Patient is aware that pain medications are meant to minimize the severity of the pain to allow their pain levels to improve to allow for better function. They are aware of that pain medications cannot totally remove their pain.  Due for UDT ( at least once per year) : due today. Pt states her insurance does not pay for UDT and she cannot afford to pay for it.   Takes hydrocodone 10/325 one bid prn and gets #30 tablets. Pt states she is only taking one daily but is struggling with the pain. Has been up all night with no sleep due to pain.    Patient compliant with pain medication. Continues to experience the pain which led to initiation of analgesic intervention. No significant negative side effects. States definitely needs the pain medication to maintain current level of functioning. Does not receive controlled substance pain medication elsewhere.   Patient compliant with insomnia medication. Generally takes most nights. No obvious morning drowsiness. Definitely helps patient sleep. Without it patient states would not get a good nights  rest.                                                                                          Review of Systems No headache, no major weight loss or weight gain, no chest pain no back pain abdominal pain no change in bowel habits complete ROS otherwise negative     Objective:   Physical Exam  Alert and oriented, vitals reviewed and stable, NAD ENT-TM's and ext canals WNL bilat via otoscopic exam Soft palate, tonsils and post pharynx WNL via oropharyngeal exam Neck-symmetric, no masses; thyroid nonpalpable and nontender Pulmonary-no tachypnea or accessory muscle use; Clear without wheezes via auscultation Card--no abnrml murmurs, rhythm reg and rate WNL Carotid pulses symmetric, without bruits       Assessment & Plan:  Impression: Chronic pain. Patient compliant with medication. No substantial side effects. [place] controlled substance registry reviewed to ensure compliance and proper use of medication. Patient aware goal of medicine is not complete resolution of pain but to control his symptoms to improve his functional capacity. Aware of potential adverse side effects

## 2018-08-08 LAB — TOXASSURE SELECT 13 (MW), URINE

## 2018-08-09 ENCOUNTER — Encounter: Payer: Self-pay | Admitting: Family Medicine

## 2018-08-10 ENCOUNTER — Telehealth: Payer: Self-pay | Admitting: Family Medicine

## 2018-08-10 MED ORDER — HYDROCODONE-ACETAMINOPHEN 10-325 MG PO TABS: ORAL_TABLET | ORAL | 0 refills | Status: DC

## 2018-08-10 MED ORDER — HYDROCODONE-ACETAMINOPHEN 10-325 MG PO TABS: 1.0000 | ORAL_TABLET | Freq: Two times a day (BID) | ORAL | 0 refills | Status: DC | PRN

## 2018-08-10 NOTE — Telephone Encounter (Signed)
Requesting refill for HYDROcodone-acetaminophen (NORCO) 10-325 MG tablet   Pharmacy:  [facility], [place] [address] [place]

## 2018-08-10 NOTE — Telephone Encounter (Signed)
Lets do three monthly scripts

## 2018-08-10 NOTE — Telephone Encounter (Signed)
Prescriptions sent electronically to pharmacy by Dr Richardson Landry. Patient notified.

## 2018-08-11 ENCOUNTER — Other Ambulatory Visit: Payer: Self-pay | Admitting: Family Medicine

## 2018-08-12 NOTE — Telephone Encounter (Signed)
Ok six mo worth 

## 2018-08-16 ENCOUNTER — Telehealth: Payer: Self-pay | Admitting: Orthopedic Surgery

## 2018-08-17 ENCOUNTER — Encounter: Payer: Self-pay | Admitting: Orthopedic Surgery

## 2018-08-17 ENCOUNTER — Ambulatory Visit: Payer: PPO | Admitting: Orthopedic Surgery

## 2018-08-17 NOTE — Telephone Encounter (Signed)
Message due to [phone]

## 2018-08-21 ENCOUNTER — Telehealth: Payer: Self-pay | Admitting: Family Medicine

## 2018-08-21 MED ORDER — ALBUTEROL SULFATE HFA 108 (90 BASE) MCG/ACT IN AERS: 2.0000 | INHALATION_SPRAY | RESPIRATORY_TRACT | 2 refills | Status: DC | PRN

## 2018-08-21 MED ORDER — ALBUTEROL SULFATE (2.5 MG/3ML) 0.083% IN NEBU: INHALATION_SOLUTION | RESPIRATORY_TRACT | 2 refills | Status: AC

## 2018-08-21 NOTE — Telephone Encounter (Signed)
Pharmacy requesting refill on Albuterol 0.083% 3 mo nebulier solution. Use one vial in nebulizer every 4 hours prn wheezing. Also requesting refill for Proair Inhaler. Inhale 2 puffs into lungs every 4 hours prn wheezing or shortness of breath.

## 2018-08-21 NOTE — Telephone Encounter (Signed)
Sure plus 3 ref ea

## 2018-08-21 NOTE — Addendum Note (Signed)
Addended by: Dairl Ponder on: 08/21/2018 03:56 PM   Modules accepted: Orders

## 2018-11-04 ENCOUNTER — Other Ambulatory Visit: Payer: Self-pay

## 2018-11-04 ENCOUNTER — Ambulatory Visit (INDEPENDENT_AMBULATORY_CARE_PROVIDER_SITE_OTHER): Payer: PPO | Admitting: Family Medicine

## 2018-11-04 ENCOUNTER — Encounter: Payer: Self-pay | Admitting: Family Medicine

## 2018-11-04 DIAGNOSIS — G894 Chronic pain syndrome: Secondary | ICD-10-CM

## 2018-11-04 DIAGNOSIS — Z79891 Long term (current) use of opiate analgesic: Secondary | ICD-10-CM

## 2018-11-04 DIAGNOSIS — F5101 Primary insomnia: Secondary | ICD-10-CM | POA: Diagnosis not present

## 2018-11-04 MED ORDER — ALPRAZOLAM 0.5 MG PO TABS: ORAL_TABLET | ORAL | 2 refills | Status: DC

## 2018-11-04 MED ORDER — HYDROCODONE-ACETAMINOPHEN 10-325 MG PO TABS: ORAL_TABLET | ORAL | 0 refills | Status: DC

## 2018-11-04 MED ORDER — METHOCARBAMOL 500 MG PO TABS: ORAL_TABLET | ORAL | 2 refills | Status: DC

## 2018-11-04 MED ORDER — HYDROCODONE-ACETAMINOPHEN 10-325 MG PO TABS: 1.0000 | ORAL_TABLET | Freq: Two times a day (BID) | ORAL | 0 refills | Status: DC | PRN

## 2018-11-04 NOTE — Progress Notes (Signed)
   Subjective:    Patient ID: Sharon [name], female    DOB: January 20, 1956, 63 y.o.   MRN: [id number] Audio only  Patient calls with multiple concerns HPI This patient was seen today for chronic pain. Takes for pain in left hip  The medication list was reviewed and updated.   -Compliance with medication: takes one to two a day.   - Number patient states they take daily: most days one tablet some days has to take 2   -when was the last dose patient took? Last night  The patient was advised the importance of maintaining medication and not using illegal substances with these.  Here for refills and follow up  The patient was educated that we can provide 3 monthly scripts for their medication, it is their responsibility to follow the instructions.  Side effects or complications from medications: none  Patient is aware that pain medications are meant to minimize the severity of the pain to allow their pain levels to improve to allow for better function. They are aware of that pain medications cannot totally remove their pain.  Due for UDT ( at least once per year) : last one done 08/03/18  Needs refill on xanax and robaxin. States she has not had robaxin filled in about 3 years.   Patient compliant with pain medication. Continues to experience the pain which led to initiation of analgesic intervention. No significant negative side effects. States definitely needs the pain medication to maintain current level of functioning. Does not receive controlled substance pain medication elsewhere.   Utilizes Robaxin as needed for muscle spasms discussed definitely would like refill no major negative side effects   Patient compliant with insomnia medication. Generally takes most nights. No obvious morning drowsiness. Definitely helps patient sleep. Without it patient states would not get a good nights rest.  Review of Systems     Objective:   Physical Exam  Virtual      Assessment & Plan:   Impression: Chronic pain. Patient compliant with medication. No substantial side effects. [place] controlled substance registry reviewed to ensure compliance and proper use of medication. Patient aware goal of medicine is not complete resolution of pain but to control his symptoms to improve his functional capacity. Aware of potential adverse side effects Using capsaicin and lidocaine patches  2.  Insomnia ongoing with definite need for Xanax side effects benefits discussed prescription given.  3.  Intermittent muscle spasms discussed  4.  Chronic virus concerns discussed questions answered  Greater than 50% of this 25 minute face to face visit was spent in counseling and discussion and coordination of care regarding the above diagnosis/diagnosies

## 2018-12-14 ENCOUNTER — Other Ambulatory Visit: Payer: Self-pay

## 2018-12-14 ENCOUNTER — Ambulatory Visit (INDEPENDENT_AMBULATORY_CARE_PROVIDER_SITE_OTHER): Payer: PPO

## 2018-12-14 ENCOUNTER — Encounter: Payer: Self-pay | Admitting: Orthopedic Surgery

## 2018-12-14 ENCOUNTER — Ambulatory Visit (INDEPENDENT_AMBULATORY_CARE_PROVIDER_SITE_OTHER): Payer: PPO | Admitting: Orthopedic Surgery

## 2018-12-14 VITALS — Temp 98.1°F | Resp 16 | Ht 60.0 in | Wt 260.0 lb

## 2018-12-14 DIAGNOSIS — Z96652 Presence of left artificial knee joint: Secondary | ICD-10-CM

## 2018-12-14 DIAGNOSIS — Z96651 Presence of right artificial knee joint: Secondary | ICD-10-CM

## 2018-12-14 NOTE — Progress Notes (Signed)
ANNUAL FOLLOW UP FOR bilateral TKA   Chief Complaint  Patient presents with  . Routine Post Op    bilateral knee replacementsleft 10//2008 right 06/17/2011     HPI: The patient is here for the annual  follow-up x-ray for knee replacement. The patient is not complaining of pain weakness instability or stiffness in the repaired knee.   Review of Systems  Constitutional: Negative for malaise/fatigue.  Musculoskeletal: Positive for back pain and joint pain.  Neurological: Positive for weakness.     Examination of the left  KNEE  Resp 16   Ht 5' (1.524 m)   Wt 260 lb (117.9 kg)   BMI 50.78 kg/m   General the patient is normally groomed in no distress  Range of motion total range of motion is 115  Stability the knee is stable anterior to posterior as well as medial to lateral  Strength quadriceps strength is normal  Skin no erythema around the skin incision   Gait: abnormal severe OA hip using a walker    Medical decision-making section  X-rays ordered, internal imaging shows (see full dictated report) stable implant with no signs of loosening Both knees in stable configuration Diagnosis  Encounter Diagnoses  Name Primary?  . 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 Status post right knee replacement 06/17/2011    The patient's BMI is much higher than would be conducive to doing a hip replacement although that is what she needs.  We have offered her bariatric treatments first are recommended that and then weight loss and then replacement but she is wary of her arthritis pain after the bariatric treatment as well as the side effects associated with that  Plan follow-up   1 year repeat x-rays both knees

## 2019-01-01 ENCOUNTER — Telehealth: Payer: Self-pay | Admitting: Family Medicine

## 2019-01-01 NOTE — Telephone Encounter (Signed)
Please complete form for pt's medical necessity to have bariatric surgery - please call daughter when done & fax to [facility]  Daughter - [phone] Fx# [phone]   Please forward to [facility] when done to be sent in    In nurses forms basket

## 2019-01-19 ENCOUNTER — Other Ambulatory Visit (HOSPITAL_COMMUNITY): Payer: Self-pay | Admitting: Family Medicine

## 2019-01-19 DIAGNOSIS — Z1231 Encounter for screening mammogram for malignant neoplasm of breast: Secondary | ICD-10-CM

## 2019-01-20 ENCOUNTER — Other Ambulatory Visit: Payer: Self-pay

## 2019-01-20 ENCOUNTER — Ambulatory Visit (HOSPITAL_COMMUNITY)
Admission: RE | Admit: 2019-01-20 | Discharge: 2019-01-20 | Disposition: A | Discharge status: Home / Self Care | Payer: PPO | Source: Ambulatory Visit | Attending: Family Medicine | Admitting: Family Medicine

## 2019-01-20 DIAGNOSIS — Z1231 Encounter for screening mammogram for malignant neoplasm of breast: Secondary | ICD-10-CM | POA: Insufficient documentation

## 2019-01-27 DIAGNOSIS — M1990 Unspecified osteoarthritis, unspecified site: Secondary | ICD-10-CM | POA: Diagnosis not present

## 2019-01-27 DIAGNOSIS — K219 Gastro-esophageal reflux disease without esophagitis: Secondary | ICD-10-CM | POA: Diagnosis not present

## 2019-02-05 ENCOUNTER — Other Ambulatory Visit: Payer: Self-pay

## 2019-02-05 ENCOUNTER — Ambulatory Visit (INDEPENDENT_AMBULATORY_CARE_PROVIDER_SITE_OTHER): Payer: PPO | Admitting: Family Medicine

## 2019-02-05 DIAGNOSIS — Z6841 Body Mass Index (BMI) 40.0 and over, adult: Secondary | ICD-10-CM | POA: Diagnosis not present

## 2019-02-05 DIAGNOSIS — G894 Chronic pain syndrome: Secondary | ICD-10-CM

## 2019-02-05 DIAGNOSIS — F5101 Primary insomnia: Secondary | ICD-10-CM | POA: Diagnosis not present

## 2019-02-05 NOTE — Progress Notes (Signed)
Subjective:  Audio only  Patient ID: Sharon [name], female    DOB: 11-28-1955, 63 y.o.   MRN: [id number]  HPI This patient was seen today for chronic pain  The medication list was reviewed and updated.   -Compliance with medication: hydrocodone 10-325 one tablet bid prn pain   - Number patient states they take daily: 2  -when was the last dose patient took? Last night  The patient was advised the importance of maintaining medication and not using illegal substances with these.  Here for refills and follow up  The patient was educated that we can provide 3 monthly scripts for their medication, it is their responsibility to follow the instructions.  Side effects or complications from medications: none  Patient is aware that pain medications are meant to minimize the severity of the pain to allow their pain levels to improve to allow for better function. They are aware of that pain medications cannot totally remove their pain.  Due for UDT ( at least once per year) :    Pt states that pain has became worse. She is unable to drive or go anywhere by herself. Pt states that she now spends about 80% of her time in bed.   Virtual Visit via Video Note  I connected with Sharon [name] on 02/05/19 at  1:10 PM EDT by a video enabled telemedicine application and verified that I am speaking with the correct person using two identifiers.  Location: Patient: home Provider: office   I discussed the limitations of evaluation and management by telemedicine and the availability of in person appointments. The patient expressed understanding and agreed to proceed.  History of Present Illness:    Observations/Objective:   Assessment and Plan:   Follow Up Instructions:    I discussed the assessment and treatment plan with the patient. The patient was provided an opportunity to ask questions and all were answered. The patient agreed with the plan and demonstrated an understanding  of the instructions.   The patient was advised to call back or seek an in-person evaluation if the symptoms worsen or if the condition fails to improve as anticipated.  I provided 25 minutes of non-face-to-face time during this encounter.   Vicente Males, LPN   Patient goes into a major protracted discussion regarding her morbid obesity.  In anticipation of the surgery a few charts in the last several years.  Of note last year the patient was unwilling to consider bariatric interventions because she felt it could keep her from taking anti-inflammatory medication.  She had a meeting recently with bariatric surgeon who stated she could take this if she had a sleeve procedure.  Patient now frustrated due to the requirements for psychology referral dietary referral.  Along with physician guided weight loss.  She states basically she is been doing this all along.  Dr. Aline Brochure is unwilling to work on her orthopedic concerns until she has substantial weight loss she is in need of a joint replacement.  Patient in a lot of pain now.  And utilizing a walker pretty much to get around.  Frustrated that this process will take a very long time  Review of Systems     Objective:   Physical Exam  Virtual      Assessment & Plan:  Impression 1 chronic pain.  Discussed.  Will increase hydrocodone to 2 half tablets per day equivalent.  Proper use discussed  2.  Morbid obesity.  Discussed at great length.  We will fill out all forms it insurance is insisting on along with the specialist.

## 2019-02-07 ENCOUNTER — Encounter: Payer: Self-pay | Admitting: Family Medicine

## 2019-02-07 MED ORDER — HYDROCODONE-ACETAMINOPHEN 10-325 MG PO TABS: ORAL_TABLET | ORAL | 0 refills | Status: DC

## 2019-02-07 MED ORDER — HYDROCODONE-ACETAMINOPHEN 10-325 MG PO TABS: 1.0000 | ORAL_TABLET | Freq: Two times a day (BID) | ORAL | 0 refills | Status: DC | PRN

## 2019-02-15 ENCOUNTER — Other Ambulatory Visit: Payer: Self-pay | Admitting: General Surgery

## 2019-02-15 ENCOUNTER — Other Ambulatory Visit (HOSPITAL_COMMUNITY): Payer: Self-pay | Admitting: General Surgery

## 2019-02-19 ENCOUNTER — Ambulatory Visit (HOSPITAL_COMMUNITY)
Admission: RE | Admit: 2019-02-19 | Discharge: 2019-02-19 | Disposition: A | Discharge status: Home / Self Care | Payer: PPO | Source: Ambulatory Visit | Attending: General Surgery | Admitting: General Surgery

## 2019-02-19 ENCOUNTER — Other Ambulatory Visit: Payer: Self-pay

## 2019-02-19 DIAGNOSIS — K224 Dyskinesia of esophagus: Secondary | ICD-10-CM | POA: Diagnosis not present

## 2019-02-19 DIAGNOSIS — Z01818 Encounter for other preprocedural examination: Secondary | ICD-10-CM | POA: Diagnosis not present

## 2019-03-24 ENCOUNTER — Encounter: Payer: Self-pay | Admitting: Dietician

## 2019-03-24 ENCOUNTER — Encounter: Payer: PPO | Attending: General Surgery | Admitting: Dietician

## 2019-03-24 ENCOUNTER — Other Ambulatory Visit: Payer: Self-pay

## 2019-03-24 DIAGNOSIS — E669 Obesity, unspecified: Secondary | ICD-10-CM

## 2019-03-24 DIAGNOSIS — Z85820 Personal history of malignant melanoma of skin: Secondary | ICD-10-CM | POA: Insufficient documentation

## 2019-03-24 DIAGNOSIS — Z8249 Family history of ischemic heart disease and other diseases of the circulatory system: Secondary | ICD-10-CM | POA: Insufficient documentation

## 2019-03-24 DIAGNOSIS — K219 Gastro-esophageal reflux disease without esophagitis: Secondary | ICD-10-CM | POA: Diagnosis not present

## 2019-03-24 DIAGNOSIS — Z836 Family history of other diseases of the respiratory system: Secondary | ICD-10-CM | POA: Diagnosis not present

## 2019-03-24 DIAGNOSIS — Z87891 Personal history of nicotine dependence: Secondary | ICD-10-CM | POA: Insufficient documentation

## 2019-03-24 DIAGNOSIS — Z6841 Body Mass Index (BMI) 40.0 and over, adult: Secondary | ICD-10-CM | POA: Diagnosis not present

## 2019-03-24 DIAGNOSIS — J45909 Unspecified asthma, uncomplicated: Secondary | ICD-10-CM | POA: Diagnosis not present

## 2019-03-24 DIAGNOSIS — Z87442 Personal history of urinary calculi: Secondary | ICD-10-CM | POA: Diagnosis not present

## 2019-03-24 DIAGNOSIS — M1990 Unspecified osteoarthritis, unspecified site: Secondary | ICD-10-CM | POA: Diagnosis not present

## 2019-03-24 DIAGNOSIS — Z79899 Other long term (current) drug therapy: Secondary | ICD-10-CM | POA: Diagnosis not present

## 2019-03-24 DIAGNOSIS — Z823 Family history of stroke: Secondary | ICD-10-CM | POA: Insufficient documentation

## 2019-03-24 DIAGNOSIS — Z8261 Family history of arthritis: Secondary | ICD-10-CM | POA: Diagnosis not present

## 2019-03-24 DIAGNOSIS — Z713 Dietary counseling and surveillance: Secondary | ICD-10-CM | POA: Diagnosis not present

## 2019-03-24 NOTE — Progress Notes (Signed)
Nutrition Assessment for Bariatric Surgery Medical Nutrition Therapy  Appt Start Time: 2:00pm    End Time: 2:35pm  Patient was seen on 03/24/2019 for Pre-Operative Nutrition Assessment. Letter of approval faxed to [facility] bariatric surgery program coordinator on 03/24/2019.   Referral stated Supervised Weight Loss (SWL) visits needed: 6  *To be completed with PCP  Planned surgery: Sleeve  Pt expectation of surgery: to lose enough weight to qualify for hip replacement   NUTRITION ASSESSMENT   Anthropometrics  Start weight at [facility]: 258 lbs (date: 03/24/2019) Height: 60 in BMI: 50.4 kg/m2    Clinical  Medical hx: asthma, GERD, kidney stone, melanoma, migraine, appendectomy, cholecystectomy, hysterectomy, knee, oral, tonsillectomy, breast biopsy  Medications: Spiriva, allegra-d, Flonase, lasix, nabumetone, Zofran, Flovent, ketorolac tromethamine, xanax, epinephrine, benadryl, fish oil, vitamin C, biotin    Lifestyle & Dietary Hx Patient arrived today with her friend who is also a former [employer], both worked as Financial risk analyst. Transportation and mobility are issues, patient requires walker or wheelchair assistance and cannot drive d/t medications she takes. Patient lives alone but has her friend or children/ grandchildren perform chores and food shopping for her. Patient states she relies heavily on her pain meds to control hip pain. Main goal of surgery is to qualify for hip surgery. States her daughter had bariatric surgery so she is familiar with the process. Did not seem interested during today's visit and was more frustrated with having SWL requirements prior to surgery.   Typical meal pattern includes 2-3 meals per day with snacks. Beverages include coffee and water, states she always keeps water bottle with her. States she has been supplementing her lunch meal with Atkins protein shake lately in efforts to follow a lower calorie diet.   24-Hr Dietary Recall First Meal:  oatmeal (or eggs)  Snack: banana  Second Meal: Atkins protein shake  Snack: apple + peanut butter  Third Meal: meat + vegetables  Snack: - Beverages: coffee w/ cream & sugar, water   Estimated Energy Needs Calories: 1600 Carbohydrate: 180g Protein: 100g Fat: 53g   NUTRITION DIAGNOSIS  Overweight/obesity ([place]-3.3) related to past poor dietary habits and physical inactivity as evidenced by patient w/ planned Sleeve surgery following dietary guidelines for continued weight loss.    NUTRITION INTERVENTION  Nutrition counseling (C-1) and education (E-2) to facilitate bariatric surgery goals.  Pre-Op Goals Reviewed with the Patient . Track food and beverage intake (pen and paper, MyFitness Pal, Baritastic app, etc.) . Make healthy food choices while monitoring portion sizes . Consume 3 meals per day or try to eat every 3-5 hours . Avoid concentrated sugars and fried foods . Keep sugar & fat in the single digits per serving on food labels . Practice CHEWING your food (aim for applesauce consistency) . Practice not drinking 15 minutes before, during, and 30 minutes after each meal and snack . Avoid all carbonated beverages (ex: soda, sparkling beverages)  . Limit caffeinated beverages (ex: coffee, tea, energy drinks) . Avoid all sugar-sweetened beverages (ex: regular soda, sports drinks)  . Avoid alcohol  . Aim for 64-100 ounces of FLUID daily (with at least half of fluid intake being plain water)  . Aim for at least 60-80 grams of PROTEIN daily . Look for a liquid protein source that contains ?15 g protein and ?5 g carbohydrate (ex: shakes, drinks, shots) . Make a list of non-food related activities . Physical activity is an important part of a healthy lifestyle so keep it moving! The goal is to  reach 150 minutes of exercise per week, including cardiovascular and weight baring activity.  Handouts Provided Include  . Bariatric Surgery handouts (Nutrition Visits, Pre-Op Goals, Protein  Shakes, Vitamins & Minerals)  Learning Style & Readiness for Change Teaching method utilized: Visual & Auditory  Demonstrated degree of understanding via: Teach Back  Barriers to learning/adherence to lifestyle change: None Stated    MONITORING & EVALUATION Dietary intake, weekly physical activity, body weight, and pre-op goals reached at next nutrition visit.   Next Steps Patient is to call [facility] to enroll in Pre-Op Class (>2 weeks before surgery) and Post-Op Class (2 weeks after surgery) for further nutrition education when surgery date is scheduled.

## 2019-03-30 ENCOUNTER — Telehealth: Payer: Self-pay | Admitting: Family Medicine

## 2019-03-30 NOTE — Telephone Encounter (Signed)
Pt would like to speak to Dr. Jeannine Kitten nurse about her upcoming surgery (wouldn't give any more details)

## 2019-03-30 NOTE — Telephone Encounter (Signed)
Pt has been trying to get in touch with [facility] regarding her weight loss surgery. Pt contacted insurance and they have not received any thing from [facility]. Pt states that it has been 9 weeks and has not heard anything. Pt states that her pain is getting worse. Pt is wanting to know what we can do to help her with the monthly weight loss. Pt states that insurance also stated that nothing has been sent from our office. Pt is down to 257; pt has to get down to 205 lbs. Pt has to have bariatric surgery to have her hip replacement.   On last pain management, pt states that her pain med was going to be increased to 75. Pt only picked up 60 tablets. 1/2 tablet 4 hrs 1 whole tablet at bedtime   Please advise. Thank you

## 2019-03-31 NOTE — Telephone Encounter (Signed)
1 need to ask insur co forms to be sent once again. At that time forms asked results of supervised wieght loss visit, I had not done these. I really prefer she does this thru a nurtisitonist but I will do if she requests  2 pts nurtition consult last week stated that the pt would have to have six supervised wieght loss visits. I thought she was doing these with the dietician. If she is doing with [place], will need a visit next wk and one monthly for six months. We can disc weight loss meds then  3 rec incr pain pills up to 75 as originally rec

## 2019-03-31 NOTE — Telephone Encounter (Signed)
Contacted patient. Pt states that [facility] is needing forms from [place], not insurance. Tried to call [facility] to get this cleared up and to see exactly was is needed. Left voicemail on Levada Dy (bariatric navigator) voicemail. Pt has been transferred up front to set up appt. Will pend pain meds.

## 2019-03-31 NOTE — Telephone Encounter (Signed)
75 tablets were sent in on day of visit. Contacted [facility] and they states there is a limit on opioids and that is why patient only received 60 tablets.

## 2019-04-01 NOTE — Telephone Encounter (Signed)
Angie from [facility] returned call. They are needing a letter of medical necessity as to why patient is needing bariatric surgery. This can be faxed to [facility] at [phone].

## 2019-04-03 ENCOUNTER — Other Ambulatory Visit: Payer: Self-pay | Admitting: Family Medicine

## 2019-04-09 ENCOUNTER — Encounter: Payer: Self-pay | Admitting: Family Medicine

## 2019-04-09 ENCOUNTER — Ambulatory Visit (INDEPENDENT_AMBULATORY_CARE_PROVIDER_SITE_OTHER): Payer: PPO | Admitting: Family Medicine

## 2019-04-09 ENCOUNTER — Other Ambulatory Visit: Payer: Self-pay

## 2019-04-09 VITALS — BP 138/92 | Temp 97.6°F | Ht 60.0 in | Wt 259.6 lb

## 2019-04-09 DIAGNOSIS — G894 Chronic pain syndrome: Secondary | ICD-10-CM | POA: Diagnosis not present

## 2019-04-09 NOTE — Progress Notes (Signed)
   Subjective:  audio  Patient ID: Sharon [name], female    DOB: 10-Feb-1956, 63 y.o.   MRN: [id number]  HPIobesity. Would like to discuss weight. BMI needs to be under 40 before they will do hip surgery.   Hydrocodone was changed at last visit but the quantity was not changed. She states she is now taking 5 halfs a day and one whole at night.   Patient compliant with pain medication. Continues to experience the pain which led to initiation of analgesic intervention. No significant negative side effects. States definitely needs the pain medication to maintain current level of functioning. Does not receive controlled substance pain medication elsewhere.   Review of Systems No headache, no major weight loss or weight gain, no chest pain no back pain abdominal pain no change in bowel habits complete ROS otherwise negative     Objective:   Physical Exam   virt      Assessment & Plan:  Impression: Chronic pain. Patient compliant with medication. No substantial side effects. [place] controlled substance registry reviewed to ensure compliance and proper use of medication. Patient aware goal of medicine is not complete resolution of pain but to control his symptoms to improve his functional capacity. Aware of potential adverse side effects  Ongoing challenges with obesity discussed

## 2019-04-11 MED ORDER — HYDROCODONE-ACETAMINOPHEN 10-325 MG PO TABS: ORAL_TABLET | ORAL | 0 refills | Status: DC

## 2019-05-10 ENCOUNTER — Ambulatory Visit (INDEPENDENT_AMBULATORY_CARE_PROVIDER_SITE_OTHER): Payer: PPO | Admitting: Family Medicine

## 2019-05-10 ENCOUNTER — Encounter: Payer: Self-pay | Admitting: Family Medicine

## 2019-05-10 ENCOUNTER — Other Ambulatory Visit: Payer: Self-pay

## 2019-05-10 VITALS — Ht 60.0 in | Wt 253.8 lb

## 2019-05-10 DIAGNOSIS — Z6841 Body Mass Index (BMI) 40.0 and over, adult: Secondary | ICD-10-CM

## 2019-05-10 DIAGNOSIS — G894 Chronic pain syndrome: Secondary | ICD-10-CM

## 2019-05-10 NOTE — Progress Notes (Signed)
   Subjective:  Audio plus video  Patient ID: Sharon [name], female    DOB: 1956-05-12, 63 y.o.   MRN: [id number]  HPIfollow up on weight. Needs 6 visits for weight before having surgery. Pt states her weight today is 253 lbs.   Virtual Visit via Video Note  I connected with Laural [name] on 05/10/19 at  3:00 PM EST by a video enabled telemedicine application and verified that I am speaking with the correct person using two identifiers.  Location: Patient: home Provider: office   I discussed the limitations of evaluation and management by telemedicine and the availability of in person appointments. The patient expressed understanding and agreed to proceed.  History of Present Illness:    Observations/Objective:   Assessment and Plan:   Follow Up Instructions:    I discussed the assessment and treatment plan with the patient. The patient was provided an opportunity to ask questions and all were answered. The patient agreed with the plan and demonstrated an understanding of the instructions.   The patient was advised to call back or seek an in-person evaluation if the symptoms worsen or if the condition fails to improve as anticipated.  I provided 18 minutes of non-face-to-face time during this encounter.  Patient continues to have ongoing orthopedic challenges.  hoping for surgery. Needs to lose weight in order to get the surgery.  Cannot exercise because of her orthopedic problems so having a harder time losing weight.  Has lost 6 pounds since last month with strict attentiveness to diet.  Continues work with the bariatric folks aiming towards an intervention hopefully sooner   Review of Systems Ongoing hip pain    Objective:   Physical Exam  Virtual      Assessment & Plan:  Impression 1 physician guided weight loss.  Exercise discussed.  Diet discussed.  6 pound weight loss since 1 month ago patient gradually symptom care discussed regarding joints.   Follow-up in 1 month

## 2019-06-01 ENCOUNTER — Other Ambulatory Visit: Payer: Self-pay | Admitting: Family Medicine

## 2019-06-04 NOTE — Telephone Encounter (Signed)
Ok six ref each 

## 2019-06-14 ENCOUNTER — Ambulatory Visit (INDEPENDENT_AMBULATORY_CARE_PROVIDER_SITE_OTHER): Payer: PPO | Admitting: Family Medicine

## 2019-06-14 ENCOUNTER — Other Ambulatory Visit: Payer: Self-pay

## 2019-06-14 NOTE — Progress Notes (Signed)
   Subjective:    Patient ID: Sharon [name], female    DOB: [birth year], 64 y.o.   MRN: [id number]  HPI Pt following up on weight loss. Pt states her weight is 255.2 lb. Pt is not on any weight loss medication.   Virtual Visit via Video Note  I connected with Sharon [name] on 06/14/19 at  3:00 PM EST by a video enabled telemedicine application and verified that I am speaking with the correct person using two identifiers.  Location: Patient: home Provider: office   I discussed the limitations of evaluation and management by telemedicine and the availability of in person appointments. The patient expressed understanding and agreed to proceed.  History of Present Illness:    Observations/Objective:   Assessment and Plan:   Follow Up Instructions:    I discussed the assessment and treatment plan with the patient. The patient was provided an opportunity to ask questions and all were answered. The patient agreed with the plan and demonstrated an understanding of the instructions.   The patient was advised to call back or seek an in-person evaluation if the symptoms worsen or if the condition fails to improve as anticipated.  I provided 4minutes of non-face-to-face time during this encounter.  Weight up a couple pounds  Medicare insisting on a 6 mo interval of counselling  Testing notes challenges losing weight.  Challenges with holiday diet.  Also inability to get out and exercise.  Patient has actually gained weight.     Review of Systems No headache no chest pain no shortness of breath    Objective:   Physical Exam   Virtual     Assessment & Plan:  I impression morbid obesity.  Encouraged to still work hard on elements of diet and exercise.  Patient plans to.  Also due to have bariatric intervention once her insurance allows.  Diet exercise discussed at length.  Follow-up as scheduled

## 2019-06-23 ENCOUNTER — Other Ambulatory Visit: Payer: Self-pay | Admitting: Family Medicine

## 2019-07-01 ENCOUNTER — Encounter: Payer: Self-pay | Admitting: Family Medicine

## 2019-07-22 ENCOUNTER — Other Ambulatory Visit: Payer: Self-pay

## 2019-07-22 ENCOUNTER — Ambulatory Visit (INDEPENDENT_AMBULATORY_CARE_PROVIDER_SITE_OTHER): Payer: PPO | Admitting: Family Medicine

## 2019-07-22 DIAGNOSIS — F5101 Primary insomnia: Secondary | ICD-10-CM | POA: Diagnosis not present

## 2019-07-22 DIAGNOSIS — G894 Chronic pain syndrome: Secondary | ICD-10-CM

## 2019-07-22 NOTE — Progress Notes (Signed)
   Subjective:    Patient ID: Sharon [name], female    DOB: 1955-12-15, 64 y.o.   MRN: [id number]  HPI Pt is having bariatric surgery and is having physician assisted weight loss visits. Pt is to have 6 monthly visits with provider.   Virtual Visit via Video Note  I connected with Sharon [name] on 07/22/19 at  1:10 PM EST by a video enabled telemedicine application and verified that I am speaking with the correct person using two identifiers.  Location: Patient: home Provider: office   I discussed the limitations of evaluation and management by telemedicine and the availability of in person appointments. The patient expressed understanding and agreed to proceed.  History of Present Illness:    Observations/Objective:   Assessment and Plan:   Follow Up Instructions:    I discussed the assessment and treatment plan with the patient. The patient was provided an opportunity to ask questions and all were answered. The patient agreed with the plan and demonstrated an understanding of the instructions.   The patient was advised to call back or seek an in-person evaluation if the symptoms worsen or if the condition fails to improve as anticipated.  I provided 22 minutes of non-face-to-face time during this encounter.  Patient continues to struggle with morbid obesity.  Her severe joint pain precludes her from any meaningful exercise.  Patient has lost only 2 pounds since our last visit a month ago.  Trying really hard to watch her diet as laid out by the dietitian.  Frustrated she cannot be physically active.     Review of Systems No headache no chest pain some shortness of breath with exertion    Objective:   Physical Exam Virtual       Assessment & Plan:  Impression severe morbid obesity with minimal improvement thus far with her own efforts and our physician guided efforts and dietitian directed efforts at diet and exercise.  Patient hopefully will qualify for  bariatric intervention shortly.  I think she needs this in order to lose weight.  And she must lose weight in order to have her hip replacement.

## 2019-08-03 ENCOUNTER — Ambulatory Visit: Payer: Self-pay | Admitting: General Surgery

## 2019-08-04 NOTE — Patient Instructions (Signed)
DUE TO COVID-19 ONLY ONE VISITOR IS ALLOWED TO COME WITH YOU AND STAY IN THE WAITING ROOM ONLY DURING PRE OP AND PROCEDURE DAY OF SURGERY. THE 1 VISITOR MAY VISIT WITH YOU AFTER SURGERY IN YOUR PRIVATE ROOM DURING VISITING HOURS ONLY!  YOU NEED TO HAVE A COVID 19 TEST ON_3/18______ @___10 :30____, THIS TEST MUST BE DONE BEFORE SURGERY, COME  Sharon [name], Sharon [name] , Sharon [name].  ([place]) ONCE YOUR COVID TEST IS COMPLETED, PLEASE BEGIN THE QUARANTINE INSTRUCTIONS AS OUTLINED IN YOUR HANDOUT.                Sharon [name]    Your procedure is scheduled on: 08/16/19   Report to [facility] Main  Entrance   Report to Short Stay at 5:30  AM     Call this number if you have problems the morning of surgery [phone]    Midnight. BRUSH YOUR TEETH MORNING OF SURGERY AND RINSE YOUR MOUTH OUT, NO CHEWING GUM CANDY OR MINTS.   Do not eat food After 6:00 PM   YOU MAY HAVE CLEAR LIQUIDS FROM MIDNIGHT UNTIL 4:30AM.   At 4:30AM Please finish the prescribed Pre-Surgery Gatorade drink.   Nothing by mouth after you finish the Gatorade drink !   MORNING OF SURGERY DRINK:   DRINK 1 G2 drink BEFORE YOU LEAVE HOME, DRINK ALL OF THE  G2 DRINK AT ONE TIME.    NO SOLID FOOD AFTER 600 PM THE NIGHT BEFORE YOUR SURGERY. YOU MAY DRINK CLEAR FLUIDS. THE G2 DRINK YOU DRINK BEFORE YOU LEAVE HOME WILL BE THE LAST FLUIDS YOU DRINK BEFORE SURGERY.  PAIN IS EXPECTED AFTER SURGERY AND WILL NOT BE COMPLETELY ELIMINATED. AMBULATION AND TYLENOL WILL HELP REDUCE INCISIONAL AND GAS PAIN. MOVEMENT IS KEY!  YOU ARE EXPECTED TO BE OUT OF BED WITHIN 4 HOURS OF ADMISSION TO YOUR PATIENT ROOM.  SITTING IN THE RECLINER THROUGHOUT THE DAY IS IMPORTANT FOR DRINKING FLUIDS AND MOVING GAS THROUGHOUT THE GI TRACT.  COMPRESSION STOCKINGS SHOULD BE WORN [place] UNLESS YOU ARE WALKING.   INCENTIVE SPIROMETER SHOULD BE USED EVERY HOUR WHILE AWAKE TO DECREASE POST-OPERATIVE  COMPLICATIONS SUCH AS PNEUMONIA.  WHEN DISCHARGED HOME, IT IS IMPORTANT TO CONTINUE TO WALK EVERY HOUR AND USE THE INCENTIVE SPIROMETER EVERY HOUR.          Take these medicines the morning of surgery with A SIP OF WATER: Use your inhalers and bring them with you to the hospital                                You may not have any metal on your body including hair pins and              piercings  Do not wear jewelry, make-up, lotions, powders or perfumes, deodorant             Do not wear nail polish on your fingernails.  Do not shave  48 hours prior to surgery.     Do not bring valuables to the hospital. [place].  Contacts, dentures or bridgework may not be worn into surgery.       Name and phone number of your driver:  Special Instructions: N/A  Please read over the following fact sheets you were given: _____________________________________________________________________             [facility] - Preparing for Surgery  Before surgery, you can play an important role.   Because skin is not sterile, your skin needs to be as free of germs as possible.   You can reduce the number of germs on your skin by washing with CHG (chlorahexidine gluconate) soap before surgery.   CHG is an antiseptic cleaner which kills germs and bonds with the skin to continue killing germs even after washing. Please DO NOT use if you have an allergy to CHG or antibacterial soaps.   If your skin becomes reddened/irritated stop using the CHG and inform your nurse when you arrive at Short Stay. Do not shave (including legs and underarms) for at least 48 hours prior to the first CHG shower . Please follow these instructions carefully:  1.  Shower with CHG Soap the night before surgery and the  morning of Surgery.  2.  If you choose to wash your hair, wash your hair first as usual with your  normal  shampoo.  3.  After you shampoo, rinse your  hair and body thoroughly to remove the  shampoo.                                        4.  Use CHG as you would any other liquid soap.  You can apply chg directly  to the skin and wash                       Gently with a scrungie or clean washcloth.  5.  Apply the CHG Soap to your body ONLY FROM THE NECK DOWN.   Do not use on face/ open                           Wound or open sores. Avoid contact with eyes, ears mouth and genitals (private parts).                       Wash face,  Genitals (private parts) with your normal soap.             6.  Wash thoroughly, paying special attention to the area where your surgery  will be performed.  7.  Thoroughly rinse your body with warm water from the neck down.  8.  DO NOT shower/wash with your normal soap after using and rinsing off  the CHG Soap.            9.  Pat yourself dry with a clean towel.            10.  Wear clean pajamas.            11.  Place clean sheets on your bed the night of your first shower and do not  sleep with pets. Day of Surgery : Do not apply any lotions/deodorants the morning of surgery.  Please wear clean clothes to the hospital/surgery center.  FAILURE TO FOLLOW THESE INSTRUCTIONS MAY RESULT IN THE CANCELLATION OF YOUR SURGERY PATIENT SIGNATURE_________________________________  NURSE SIGNATURE__________________________________  ________________________________________________________________________   Sharon [name]  An incentive spirometer is a tool that can help keep your lungs clear and active. This tool measures how well you  are filling your lungs with each breath. Taking long deep breaths may help reverse or decrease the chance of developing breathing (pulmonary) problems (especially infection) following:  A long period of time when you are unable to move or be active. BEFORE THE PROCEDURE   If the spirometer includes an indicator to show your best effort, your nurse or respiratory therapist will set it  to a desired goal.  If possible, sit up straight or lean slightly forward. Try not to slouch.  Hold the incentive spirometer in an upright position. INSTRUCTIONS FOR USE  1. Sit on the edge of your bed if possible, or sit up as far as you can in bed or on a chair. 2. Hold the incentive spirometer in an upright position. 3. Breathe out normally. 4. Place the mouthpiece in your mouth and seal your lips tightly around it. 5. Breathe in slowly and as deeply as possible, raising the piston or the ball toward the top of the column. 6. Hold your breath for 3-5 seconds or for as long as possible. Allow the piston or ball to fall to the bottom of the column. 7. Remove the mouthpiece from your mouth and breathe out normally. 8. Rest for a few seconds and repeat Steps 1 through 7 at least 10 times every 1-2 hours when you are awake. Take your time and take a few normal breaths between deep breaths. 9. The spirometer may include an indicator to show your best effort. Use the indicator as a goal to work toward during each repetition. 10. After each set of 10 deep breaths, practice coughing to be sure your lungs are clear. If you have an incision (the cut made at the time of surgery), support your incision when coughing by placing a pillow or rolled up towels firmly against it. Once you are able to get out of bed, walk around indoors and cough well. You may stop using the incentive spirometer when instructed by your caregiver.  RISKS AND COMPLICATIONS  Take your time so you do not get dizzy or light-headed.  If you are in pain, you may need to take or ask for pain medication before doing incentive spirometry. It is harder to take a deep breath if you are having pain. AFTER USE  Rest and breathe slowly and easily.  It can be helpful to keep track of a log of your progress. Your caregiver can provide you with a simple table to help with this. If you are using the spirometer at home, follow these  instructions: Sharon [name] IF:   You are having difficultly using the spirometer.  You have trouble using the spirometer as often as instructed.  Your pain medication is not giving enough relief while using the spirometer.  You develop fever of 100.5 F (38.1 C) or higher. SEEK IMMEDIATE MEDICAL CARE IF:   You cough up bloody sputum that had not been present before.  You develop fever of 102 F (38.9 C) or greater.  You develop worsening pain at or near the incision site. MAKE SURE YOU:   Understand these instructions.  Will watch your condition.  Will get help right away if you are not doing well or get worse. Document Released: 09/23/2006 Document Revised: 08/05/2011 Document Reviewed: 11/24/2006 [facility] Patient Information 2014 [place], [place].   ________________________________________________________________________

## 2019-08-05 ENCOUNTER — Encounter (HOSPITAL_COMMUNITY): Payer: Self-pay

## 2019-08-05 ENCOUNTER — Encounter (HOSPITAL_COMMUNITY)
Admission: RE | Admit: 2019-08-05 | Discharge: 2019-08-05 | Disposition: A | Discharge status: Home / Self Care | Payer: PPO | Source: Ambulatory Visit | Attending: General Surgery | Admitting: General Surgery

## 2019-08-05 ENCOUNTER — Other Ambulatory Visit: Payer: Self-pay

## 2019-08-05 DIAGNOSIS — Z01812 Encounter for preprocedural laboratory examination: Secondary | ICD-10-CM | POA: Insufficient documentation

## 2019-08-05 NOTE — Progress Notes (Signed)
PCP - Dr. Marsa Aris Cardiologist - none  Chest x-ray - 02/19/19 EKG - 02/19/19 Stress Test - no ECHO - no Cardiac Cath -no   Sleep Study - no CPAP -   Fasting Blood Sugar - NA Checks Blood Sugar _____ times a day  Blood Thinner Instructions:NA Aspirin Instructions: Last Dose:  Anesthesia review:   Patient denies shortness of breath, fever, cough and chest pain at PAT appointment yes  Patient verbalized understanding of instructions that were given to them at the PAT appointment. Patient was also instructed that they will need to review over the PAT instructions again at home before surgery. Yes  BMI 50.0

## 2019-08-06 ENCOUNTER — Encounter: Payer: Self-pay | Admitting: Dietician

## 2019-08-06 ENCOUNTER — Encounter (HOSPITAL_COMMUNITY)
Admission: RE | Admit: 2019-08-06 | Discharge: 2019-08-06 | Disposition: A | Discharge status: Home / Self Care | Payer: PPO | Source: Ambulatory Visit | Attending: General Surgery | Admitting: General Surgery

## 2019-08-06 ENCOUNTER — Telehealth (HOSPITAL_COMMUNITY): Payer: Self-pay

## 2019-08-06 ENCOUNTER — Encounter: Payer: PPO | Attending: General Surgery | Admitting: Dietician

## 2019-08-06 DIAGNOSIS — Z8249 Family history of ischemic heart disease and other diseases of the circulatory system: Secondary | ICD-10-CM | POA: Insufficient documentation

## 2019-08-06 DIAGNOSIS — Z8261 Family history of arthritis: Secondary | ICD-10-CM | POA: Diagnosis not present

## 2019-08-06 DIAGNOSIS — Z6841 Body Mass Index (BMI) 40.0 and over, adult: Secondary | ICD-10-CM | POA: Diagnosis not present

## 2019-08-06 DIAGNOSIS — Z87442 Personal history of urinary calculi: Secondary | ICD-10-CM | POA: Insufficient documentation

## 2019-08-06 DIAGNOSIS — J45909 Unspecified asthma, uncomplicated: Secondary | ICD-10-CM | POA: Insufficient documentation

## 2019-08-06 DIAGNOSIS — E669 Obesity, unspecified: Secondary | ICD-10-CM

## 2019-08-06 DIAGNOSIS — Z01812 Encounter for preprocedural laboratory examination: Secondary | ICD-10-CM | POA: Diagnosis not present

## 2019-08-06 DIAGNOSIS — M1990 Unspecified osteoarthritis, unspecified site: Secondary | ICD-10-CM | POA: Insufficient documentation

## 2019-08-06 DIAGNOSIS — Z79899 Other long term (current) drug therapy: Secondary | ICD-10-CM | POA: Insufficient documentation

## 2019-08-06 DIAGNOSIS — Z713 Dietary counseling and surveillance: Secondary | ICD-10-CM | POA: Diagnosis not present

## 2019-08-06 DIAGNOSIS — Z85820 Personal history of malignant melanoma of skin: Secondary | ICD-10-CM | POA: Diagnosis not present

## 2019-08-06 DIAGNOSIS — K219 Gastro-esophageal reflux disease without esophagitis: Secondary | ICD-10-CM | POA: Insufficient documentation

## 2019-08-06 DIAGNOSIS — Z836 Family history of other diseases of the respiratory system: Secondary | ICD-10-CM | POA: Insufficient documentation

## 2019-08-06 DIAGNOSIS — Z87891 Personal history of nicotine dependence: Secondary | ICD-10-CM | POA: Insufficient documentation

## 2019-08-06 DIAGNOSIS — E785 Hyperlipidemia, unspecified: Secondary | ICD-10-CM | POA: Diagnosis not present

## 2019-08-06 DIAGNOSIS — I10 Essential (primary) hypertension: Secondary | ICD-10-CM | POA: Diagnosis not present

## 2019-08-06 LAB — CBC WITH DIFFERENTIAL/PLATELET
Abs Immature Granulocytes: 0.02 10*3/uL (ref 0.00–0.07)
Basophils Absolute: 0.1 10*3/uL (ref 0.0–0.1)
Basophils Relative: 1 %
Eosinophils Absolute: 0.2 10*3/uL (ref 0.0–0.5)
Eosinophils Relative: 2 %
HCT: 46.3 % — ABNORMAL HIGH (ref 36.0–46.0)
Hemoglobin: 15.2 g/dL — ABNORMAL HIGH (ref 12.0–15.0)
Immature Granulocytes: 0 %
Lymphocytes Relative: 34 %
Lymphs Abs: 3.1 10*3/uL (ref 0.7–4.0)
MCH: 32.3 pg (ref 26.0–34.0)
MCHC: 32.8 g/dL (ref 30.0–36.0)
MCV: 98.5 fL (ref 80.0–100.0)
Monocytes Absolute: 1.1 10*3/uL — ABNORMAL HIGH (ref 0.1–1.0)
Monocytes Relative: 12 %
Neutro Abs: 4.7 10*3/uL (ref 1.7–7.7)
Neutrophils Relative %: 51 %
Platelets: 262 10*3/uL (ref 150–400)
RBC: 4.7 MIL/uL (ref 3.87–5.11)
RDW: 12.4 % (ref 11.5–15.5)
WBC: 9.1 10*3/uL (ref 4.0–10.5)
nRBC: 0 % (ref 0.0–0.2)

## 2019-08-06 LAB — COMPREHENSIVE METABOLIC PANEL
ALT: 20 U/L (ref 0–44)
AST: 24 U/L (ref 15–41)
Albumin: 4.4 g/dL (ref 3.5–5.0)
Alkaline Phosphatase: 78 U/L (ref 38–126)
Anion gap: 12 (ref 5–15)
BUN: 31 mg/dL — ABNORMAL HIGH (ref 8–23)
CO2: 28 mmol/L (ref 22–32)
Calcium: 9.5 mg/dL (ref 8.9–10.3)
Chloride: 100 mmol/L (ref 98–111)
Creatinine, Ser: 0.85 mg/dL (ref 0.44–1.00)
GFR calc Af Amer: >60 mL/min (ref >60)
GFR calc non Af Amer: >60 mL/min (ref >60)
Glucose, Bld: 118 mg/dL — ABNORMAL HIGH (ref 70–99)
Potassium: 3.5 mmol/L (ref 3.5–5.1)
Sodium: 140 mmol/L (ref 135–145)
Total Bilirubin: 1.1 mg/dL (ref 0.3–1.2)
Total Protein: 7.9 g/dL (ref 6.5–8.1)

## 2019-08-06 LAB — ABO/RH: ABO/RH(D): B NEG

## 2019-08-06 NOTE — Progress Notes (Signed)
Pre-Op Nutrition Class   Bariatric Nutrition Education   Patient was seen on 08/06/2019 for Pre-Operative Bariatric Surgery Education at [facility].    Surgery date: 08/16/2019 Surgery type: Sleeve   Start weight at [facility]: 258 lbs (date: 03/24/2019) Weight today: 257.1 lbs BMI: 50.2 kg/m2   Samples Given per MNT Protocol (pt educated on appropriate usage) Multivitamin: Bariatric Advantage Multi Chewy Bite Lot #[id number] Exp: 08/16/2019   Calcium: Celebrate Calcium Citrate Chew (Blackberry- Sugar Free) Lot #[id number] Exp: 04/2020   Protein Drink: Unjury High Protein Drink Mix  Lot #[id number]  Exp: 07/2019   The following the learning objectives were met by the patient during this course:  Identify Pre-Op Dietary Goals and begin 2 weeks prior to surgery  Identify appropriate sources of fluids and proteins   State protein recommendations and appropriate sources pre and post-operatively  Identify Post-Operative Dietary Goals and follow for 2 weeks post-operatively  Identify appropriate multivitamin and calcium sources  Describe the need for physical activity post-operatively and will follow MD recommendations  State when to call healthcare provider regarding medication questions or post-operative complications   Handouts given include:  Pre-Op Bariatric Surgery Diet Handout  Protein Shake Handout  Post-Op Bariatric Surgery Nutrition Handout  BELT Program Information Flyer  Support Group Information Flyer  [facility] Bariatric Supplements Price List   Follow-Up Plan: Patient will follow-up at [facility] 2 weeks post operatively for diet advancement per MD.

## 2019-08-06 NOTE — Telephone Encounter (Signed)
Patient had questions concerns related to medication (nsaid) in Bariatric pre op education class.  Educated patient to stop 7 days prior to surgery and 6 weeks after surgery..  Patient asked [place] to confirm with surgeon.  Spoke with surgeon who wants patient to stop medication as directed in education class, patient called.  Voicemail left for patient to stop medication and provided the patient contact information should questions arise.

## 2019-08-09 ENCOUNTER — Ambulatory Visit (INDEPENDENT_AMBULATORY_CARE_PROVIDER_SITE_OTHER): Payer: PPO | Admitting: Family Medicine

## 2019-08-09 ENCOUNTER — Other Ambulatory Visit: Payer: Self-pay

## 2019-08-09 DIAGNOSIS — M2550 Pain in unspecified joint: Secondary | ICD-10-CM

## 2019-08-09 NOTE — Progress Notes (Signed)
   Subjective:  Audio plus video  Patient ID: Sharon [name], female    DOB: 1955-11-28, 64 y.o.   MRN: [id number]  HPI  Patient calls to discuss upcoming surgery last week. Patient states her surgeon is having her stop some of her meds. Patient is also concerned about pain management after her surgery.  Virtual Visit via Video Note  I connected with Sharon [name] on 08/09/19 at  2:30 PM EDT by a video enabled telemedicine application and verified that I am speaking with the correct person using two identifiers.  Location: Patient: home Provider: office   I discussed the limitations of evaluation and management by telemedicine and the availability of in person appointments. The patient expressed understanding and agreed to proceed.  History of Present Illness:    Observations/Objective:   Assessment and Plan:   Follow Up Instructions:    I discussed the assessment and treatment plan with the patient. The patient was provided an opportunity to ask questions and all were answered. The patient agreed with the plan and demonstrated an understanding of the instructions.   The patient was advised to call back or seek an in-person evaluation if the symptoms worsen or if the condition fails to improve as anticipated.  I provided 20 minutes of non-face-to-face time during this encounter.  Due to have bariatric intervention soon  Patient having progressive challenges with her arthritis.  She is always wondered if it is something more serious than osteoarthritis.  No headache no chest pain no shortness of breath  Review of Systems    Objective:   Physical Exam   Virtual     Assessment & Plan:  Impression progressive arthritis.  Due to have bariatric surgery shortly.  Patient would like some blood work to further assess for potential of other more serious etiologies for her arthritis.  Will comply with this request

## 2019-08-12 ENCOUNTER — Other Ambulatory Visit: Payer: Self-pay | Admitting: Family Medicine

## 2019-08-12 ENCOUNTER — Other Ambulatory Visit (HOSPITAL_COMMUNITY)
Admission: RE | Admit: 2019-08-12 | Discharge: 2019-08-12 | Disposition: A | Discharge status: Home / Self Care | Payer: PPO | Source: Ambulatory Visit | Attending: General Surgery | Admitting: General Surgery

## 2019-08-12 DIAGNOSIS — Z01812 Encounter for preprocedural laboratory examination: Secondary | ICD-10-CM | POA: Diagnosis not present

## 2019-08-12 DIAGNOSIS — Z20822 Contact with and (suspected) exposure to covid-19: Secondary | ICD-10-CM | POA: Insufficient documentation

## 2019-08-12 LAB — SARS CORONAVIRUS 2 (TAT 6-24 HRS): SARS Coronavirus 2: NEGATIVE

## 2019-08-13 ENCOUNTER — Telehealth: Payer: Self-pay | Admitting: Family Medicine

## 2019-08-13 MED ORDER — ONDANSETRON 8 MG PO TBDP: ORAL_TABLET | ORAL | 5 refills | Status: DC

## 2019-08-13 NOTE — Telephone Encounter (Signed)
Zofran refilled and sent to pharmacy. Pt is aware and verbalized understanding

## 2019-08-13 NOTE — Telephone Encounter (Signed)
Ref zofran plus five ref

## 2019-08-13 NOTE — Telephone Encounter (Signed)
Pt needs a refill on zofran to [facility].   Pt dropped off form. Form placed in nurse basket at nurse station.

## 2019-08-13 NOTE — Telephone Encounter (Signed)
Form in dr steve's folder and see note below pt requesting refill on zofran

## 2019-08-15 ENCOUNTER — Encounter (HOSPITAL_COMMUNITY): Payer: Self-pay | Admitting: General Surgery

## 2019-08-15 NOTE — Anesthesia Preprocedure Evaluation (Addendum)
Anesthesia Evaluation  Patient identified by MRN, date of birth, ID band Patient awake    Reviewed: Allergy & Precautions, NPO status , Patient's Chart, lab work & pertinent test results  History of Anesthesia Complications (+) Emergence Delirium and history of anesthetic complications  Airway Mallampati: II       Dental  (+) Poor Dentition, Missing,    Pulmonary asthma , former smoker,    Pulmonary exam normal breath sounds clear to auscultation       Cardiovascular Normal cardiovascular exam Rhythm:Regular Rate:Normal     Neuro/Psych  Headaches, negative psych ROS   GI/Hepatic Neg liver ROS, GERD  Medicated and Controlled,  Endo/Other  Morbid obesity  Renal/GU negative Renal ROS  negative genitourinary   Musculoskeletal   Abdominal (+) + obese,   Peds  Hematology negative hematology ROS (+)   Anesthesia Other Findings   Reproductive/Obstetrics                            Anesthesia Physical Anesthesia Plan  ASA: III  Anesthesia Plan: General   Post-op Pain Management:    Induction: Intravenous  PONV Risk Score and Plan: 4 or greater and Ondansetron and Dexamethasone  Airway Management Planned: Oral ETT  Additional Equipment: None  Intra-op Plan:   Post-operative Plan: Extubation in OR  Informed Consent: I have reviewed the patients History and Physical, chart, labs and discussed the procedure including the risks, benefits and alternatives for the proposed anesthesia with the patient or authorized representative who has indicated his/her understanding and acceptance.     Dental advisory given  Plan Discussed with: CRNA  Anesthesia Plan Comments:        Anesthesia Quick Evaluation

## 2019-08-16 ENCOUNTER — Other Ambulatory Visit: Payer: Self-pay

## 2019-08-16 ENCOUNTER — Encounter (HOSPITAL_COMMUNITY): Payer: Self-pay | Admitting: General Surgery

## 2019-08-16 ENCOUNTER — Encounter (HOSPITAL_COMMUNITY)
Admission: RE | Disposition: A | Discharge status: Home / Self Care | Payer: Self-pay | Source: Home / Self Care | Attending: General Surgery

## 2019-08-16 ENCOUNTER — Inpatient Hospital Stay (HOSPITAL_COMMUNITY)
Admission: RE | Admit: 2019-08-16 | Discharge: 2019-08-17 | DRG: 621 | Disposition: A | Discharge status: Home / Self Care | Payer: PPO | Attending: General Surgery | Admitting: General Surgery

## 2019-08-16 ENCOUNTER — Inpatient Hospital Stay (HOSPITAL_COMMUNITY): Payer: PPO | Admitting: Anesthesiology

## 2019-08-16 ENCOUNTER — Inpatient Hospital Stay (HOSPITAL_COMMUNITY): Payer: PPO | Admitting: Physician Assistant

## 2019-08-16 DIAGNOSIS — Z96652 Presence of left artificial knee joint: Secondary | ICD-10-CM | POA: Diagnosis not present

## 2019-08-16 DIAGNOSIS — Z6841 Body Mass Index (BMI) 40.0 and over, adult: Secondary | ICD-10-CM | POA: Diagnosis not present

## 2019-08-16 DIAGNOSIS — Z79899 Other long term (current) drug therapy: Secondary | ICD-10-CM | POA: Diagnosis not present

## 2019-08-16 DIAGNOSIS — Z9109 Other allergy status, other than to drugs and biological substances: Secondary | ICD-10-CM

## 2019-08-16 DIAGNOSIS — Z91040 Latex allergy status: Secondary | ICD-10-CM

## 2019-08-16 DIAGNOSIS — M1990 Unspecified osteoarthritis, unspecified site: Secondary | ICD-10-CM | POA: Diagnosis not present

## 2019-08-16 DIAGNOSIS — Z8249 Family history of ischemic heart disease and other diseases of the circulatory system: Secondary | ICD-10-CM

## 2019-08-16 DIAGNOSIS — Z801 Family history of malignant neoplasm of trachea, bronchus and lung: Secondary | ICD-10-CM

## 2019-08-16 DIAGNOSIS — J45909 Unspecified asthma, uncomplicated: Secondary | ICD-10-CM | POA: Diagnosis present

## 2019-08-16 DIAGNOSIS — I10 Essential (primary) hypertension: Secondary | ICD-10-CM | POA: Diagnosis not present

## 2019-08-16 DIAGNOSIS — M1712 Unilateral primary osteoarthritis, left knee: Secondary | ICD-10-CM | POA: Diagnosis not present

## 2019-08-16 DIAGNOSIS — Z91018 Allergy to other foods: Secondary | ICD-10-CM | POA: Diagnosis not present

## 2019-08-16 DIAGNOSIS — F419 Anxiety disorder, unspecified: Secondary | ICD-10-CM | POA: Diagnosis not present

## 2019-08-16 DIAGNOSIS — Z881 Allergy status to other antibiotic agents status: Secondary | ICD-10-CM | POA: Diagnosis not present

## 2019-08-16 DIAGNOSIS — Z87891 Personal history of nicotine dependence: Secondary | ICD-10-CM

## 2019-08-16 DIAGNOSIS — J302 Other seasonal allergic rhinitis: Secondary | ICD-10-CM

## 2019-08-16 DIAGNOSIS — K219 Gastro-esophageal reflux disease without esophagitis: Secondary | ICD-10-CM | POA: Diagnosis present

## 2019-08-16 DIAGNOSIS — Z20822 Contact with and (suspected) exposure to covid-19: Secondary | ICD-10-CM | POA: Diagnosis present

## 2019-08-16 DIAGNOSIS — E669 Obesity, unspecified: Secondary | ICD-10-CM | POA: Diagnosis present

## 2019-08-16 HISTORY — PX: LAPAROSCOPIC GASTRIC SLEEVE RESECTION: SHX5895

## 2019-08-16 LAB — TYPE AND SCREEN
ABO/RH(D): B NEG
Antibody Screen: NEGATIVE

## 2019-08-16 LAB — HEMOGLOBIN AND HEMATOCRIT, BLOOD
HCT: 47.5 % — ABNORMAL HIGH (ref 36.0–46.0)
Hemoglobin: 15.1 g/dL — ABNORMAL HIGH (ref 12.0–15.0)

## 2019-08-16 LAB — SURGICAL PATHOLOGY

## 2019-08-16 SURGERY — GASTRECTOMY, SLEEVE, LAPAROSCOPIC
Anesthesia: General | Site: Abdomen

## 2019-08-16 MED ORDER — HEPARIN SODIUM (PORCINE) 5000 UNIT/ML IJ SOLN
5000.0000 [IU] | INTRAMUSCULAR | Status: AC
  Administered 2019-08-16: 5000 [IU] via SUBCUTANEOUS
  Filled 2019-08-16: qty 1

## 2019-08-16 MED ORDER — ENOXAPARIN SODIUM 30 MG/0.3ML ~~LOC~~ SOLN
30.0000 mg | Freq: Two times a day (BID) | SUBCUTANEOUS | Status: DC
  Administered 2019-08-16 – 2019-08-17 (×2): 30 mg via SUBCUTANEOUS
  Filled 2019-08-16 (×2): qty 0.3

## 2019-08-16 MED ORDER — GABAPENTIN 300 MG PO CAPS
300.0000 mg | ORAL_CAPSULE | ORAL | Status: AC
  Administered 2019-08-16: 300 mg via ORAL
  Filled 2019-08-16: qty 1

## 2019-08-16 MED ORDER — PROMETHAZINE HCL 25 MG/ML IJ SOLN: 6.2500 mg | INTRAMUSCULAR | Status: DC | PRN

## 2019-08-16 MED ORDER — LACTATED RINGERS IV SOLN: INTRAVENOUS | Status: DC | PRN

## 2019-08-16 MED ORDER — ENOXAPARIN (LOVENOX) PATIENT EDUCATION KIT
PACK | Freq: Once | Status: AC
  Filled 2019-08-16: qty 1

## 2019-08-16 MED ORDER — STERILE WATER FOR IRRIGATION IR SOLN
Status: DC | PRN
  Administered 2019-08-16: 1000 mL

## 2019-08-16 MED ORDER — MORPHINE SULFATE (PF) 2 MG/ML IV SOLN: 1.0000 mg | INTRAVENOUS | Status: DC | PRN

## 2019-08-16 MED ORDER — HYDROMORPHONE HCL 1 MG/ML IJ SOLN
INTRAMUSCULAR | Status: AC
  Filled 2019-08-16: qty 1

## 2019-08-16 MED ORDER — CHLORHEXIDINE GLUCONATE 4 % EX LIQD: 60.0000 mL | Freq: Once | CUTANEOUS | Status: DC

## 2019-08-16 MED ORDER — PROMETHAZINE HCL 25 MG/ML IJ SOLN: 12.5000 mg | Freq: Once | INTRAMUSCULAR | Status: AC

## 2019-08-16 MED ORDER — LIDOCAINE 2% (20 MG/ML) 5 ML SYRINGE
INTRAMUSCULAR | Status: AC
  Filled 2019-08-16: qty 5

## 2019-08-16 MED ORDER — ACETAMINOPHEN 160 MG/5ML PO SOLN: 1000.0000 mg | Freq: Three times a day (TID) | ORAL | Status: DC

## 2019-08-16 MED ORDER — PROPOFOL 10 MG/ML IV BOLUS
INTRAVENOUS | Status: AC
  Filled 2019-08-16: qty 20

## 2019-08-16 MED ORDER — SCOPOLAMINE 1 MG/3DAYS TD PT72
1.0000 | MEDICATED_PATCH | TRANSDERMAL | Status: DC
  Administered 2019-08-16: 1.5 mg via TRANSDERMAL
  Filled 2019-08-16: qty 1

## 2019-08-16 MED ORDER — BUPIVACAINE HCL (PF) 0.25 % IJ SOLN
INTRAMUSCULAR | Status: AC
  Filled 2019-08-16: qty 30

## 2019-08-16 MED ORDER — ROCURONIUM BROMIDE 10 MG/ML (PF) SYRINGE
PREFILLED_SYRINGE | INTRAVENOUS | Status: DC | PRN
  Administered 2019-08-16: 70 mg via INTRAVENOUS

## 2019-08-16 MED ORDER — SIMETHICONE 80 MG PO CHEW
80.0000 mg | CHEWABLE_TABLET | Freq: Four times a day (QID) | ORAL | Status: DC | PRN
  Filled 2019-08-16: qty 1

## 2019-08-16 MED ORDER — LIDOCAINE 2% (20 MG/ML) 5 ML SYRINGE
INTRAMUSCULAR | Status: DC | PRN
  Administered 2019-08-16: 1.5 mg/kg/h via INTRAVENOUS

## 2019-08-16 MED ORDER — FENTANYL CITRATE (PF) 250 MCG/5ML IJ SOLN
INTRAMUSCULAR | Status: AC
  Filled 2019-08-16: qty 5

## 2019-08-16 MED ORDER — ACETAMINOPHEN 500 MG PO TABS
1000.0000 mg | ORAL_TABLET | ORAL | Status: AC
  Administered 2019-08-16: 1000 mg via ORAL
  Filled 2019-08-16: qty 2

## 2019-08-16 MED ORDER — GABAPENTIN 100 MG PO CAPS
200.0000 mg | ORAL_CAPSULE | Freq: Two times a day (BID) | ORAL | Status: DC
  Administered 2019-08-16 – 2019-08-17 (×2): 200 mg via ORAL
  Filled 2019-08-16 (×2): qty 2

## 2019-08-16 MED ORDER — BUPIVACAINE LIPOSOME 1.3 % IJ SUSP
20.0000 mL | Freq: Once | INTRAMUSCULAR | Status: DC
  Filled 2019-08-16: qty 20

## 2019-08-16 MED ORDER — HYDRALAZINE HCL 20 MG/ML IJ SOLN
10.0000 mg | Freq: Once | INTRAMUSCULAR | Status: AC
  Administered 2019-08-16: 10 mg via INTRAVENOUS

## 2019-08-16 MED ORDER — DEXAMETHASONE SODIUM PHOSPHATE 4 MG/ML IJ SOLN: 4.0000 mg | INTRAMUSCULAR | Status: DC

## 2019-08-16 MED ORDER — METHOCARBAMOL 500 MG PO TABS: 500.0000 mg | ORAL_TABLET | Freq: Four times a day (QID) | ORAL | Status: DC | PRN

## 2019-08-16 MED ORDER — FAMOTIDINE IN NACL 20-0.9 MG/50ML-% IV SOLN
20.0000 mg | Freq: Two times a day (BID) | INTRAVENOUS | Status: DC
  Administered 2019-08-16 – 2019-08-17 (×2): 20 mg via INTRAVENOUS
  Filled 2019-08-16 (×2): qty 50

## 2019-08-16 MED ORDER — SUGAMMADEX SODIUM 500 MG/5ML IV SOLN
INTRAVENOUS | Status: DC | PRN
  Administered 2019-08-16: 350 mg via INTRAVENOUS

## 2019-08-16 MED ORDER — SODIUM CHLORIDE 0.9 % IV SOLN
2.0000 g | INTRAVENOUS | Status: AC
  Administered 2019-08-16: 2 g via INTRAVENOUS
  Filled 2019-08-16: qty 2

## 2019-08-16 MED ORDER — OXYCODONE HCL 5 MG/5ML PO SOLN
5.0000 mg | Freq: Four times a day (QID) | ORAL | Status: DC | PRN
  Administered 2019-08-16 – 2019-08-17 (×2): 5 mg via ORAL
  Filled 2019-08-16 (×2): qty 5

## 2019-08-16 MED ORDER — PROMETHAZINE HCL 25 MG/ML IJ SOLN
INTRAMUSCULAR | Status: AC
  Administered 2019-08-16: 12.5 mg via INTRAVENOUS
  Filled 2019-08-16: qty 1

## 2019-08-16 MED ORDER — ALPRAZOLAM 0.5 MG PO TABS: 0.5000 mg | ORAL_TABLET | Freq: Two times a day (BID) | ORAL | Status: DC | PRN

## 2019-08-16 MED ORDER — LABETALOL HCL 5 MG/ML IV SOLN
INTRAVENOUS | Status: DC | PRN
  Administered 2019-08-16: 2.5 mg via INTRAVENOUS
  Administered 2019-08-16: 5 mg via INTRAVENOUS
  Administered 2019-08-16: 2.5 mg via INTRAVENOUS

## 2019-08-16 MED ORDER — HYDROMORPHONE HCL 1 MG/ML IJ SOLN
0.2500 mg | INTRAMUSCULAR | Status: DC | PRN
  Administered 2019-08-16: 0.25 mg via INTRAVENOUS

## 2019-08-16 MED ORDER — KETAMINE HCL 10 MG/ML IJ SOLN
INTRAMUSCULAR | Status: DC | PRN
  Administered 2019-08-16: 20 mg via INTRAVENOUS

## 2019-08-16 MED ORDER — FENTANYL CITRATE (PF) 250 MCG/5ML IJ SOLN
INTRAMUSCULAR | Status: DC | PRN
  Administered 2019-08-16 (×5): 50 ug via INTRAVENOUS

## 2019-08-16 MED ORDER — BUPIVACAINE HCL 0.25 % IJ SOLN
INTRAMUSCULAR | Status: DC | PRN
  Administered 2019-08-16: 30 mL

## 2019-08-16 MED ORDER — DEXTROSE-NACL 5-0.45 % IV SOLN: INTRAVENOUS | Status: DC

## 2019-08-16 MED ORDER — ENSURE MAX PROTEIN PO LIQD
2.0000 [oz_av] | ORAL | Status: DC
  Administered 2019-08-17 (×4): 2 [oz_av] via ORAL

## 2019-08-16 MED ORDER — HYDRALAZINE HCL 20 MG/ML IJ SOLN
INTRAMUSCULAR | Status: AC
  Filled 2019-08-16: qty 1

## 2019-08-16 MED ORDER — ACETAMINOPHEN 500 MG PO TABS
1000.0000 mg | ORAL_TABLET | Freq: Three times a day (TID) | ORAL | Status: DC
  Administered 2019-08-16 – 2019-08-17 (×3): 1000 mg via ORAL
  Filled 2019-08-16 (×2): qty 2

## 2019-08-16 MED ORDER — ONDANSETRON HCL 4 MG/2ML IJ SOLN
INTRAMUSCULAR | Status: DC | PRN
  Administered 2019-08-16: 4 mg via INTRAVENOUS

## 2019-08-16 MED ORDER — LIDOCAINE 2% (20 MG/ML) 5 ML SYRINGE
INTRAMUSCULAR | Status: DC | PRN
  Administered 2019-08-16: 100 mg via INTRAVENOUS

## 2019-08-16 MED ORDER — MEPERIDINE HCL 50 MG/ML IJ SOLN: 6.2500 mg | INTRAMUSCULAR | Status: DC | PRN

## 2019-08-16 MED ORDER — KETOROLAC TROMETHAMINE 30 MG/ML IJ SOLN: 30.0000 mg | Freq: Once | INTRAMUSCULAR | Status: DC | PRN

## 2019-08-16 MED ORDER — BUPIVACAINE LIPOSOME 1.3 % IJ SUSP
INTRAMUSCULAR | Status: DC | PRN
  Administered 2019-08-16: 20 mL

## 2019-08-16 MED ORDER — APREPITANT 40 MG PO CAPS
40.0000 mg | ORAL_CAPSULE | ORAL | Status: AC
  Administered 2019-08-16: 40 mg via ORAL
  Filled 2019-08-16: qty 1

## 2019-08-16 MED ORDER — PROPOFOL 10 MG/ML IV BOLUS
INTRAVENOUS | Status: DC | PRN
  Administered 2019-08-16: 200 mg via INTRAVENOUS

## 2019-08-16 MED ORDER — 0.9 % SODIUM CHLORIDE (POUR BTL) OPTIME
TOPICAL | Status: DC | PRN
  Administered 2019-08-16: 1000 mL

## 2019-08-16 MED ORDER — MIDAZOLAM HCL 2 MG/2ML IJ SOLN
INTRAMUSCULAR | Status: AC
  Filled 2019-08-16: qty 2

## 2019-08-16 MED ORDER — ONDANSETRON HCL 4 MG/2ML IJ SOLN: 4.0000 mg | INTRAMUSCULAR | Status: DC | PRN

## 2019-08-16 MED ORDER — MIDAZOLAM HCL 2 MG/2ML IJ SOLN
INTRAMUSCULAR | Status: DC | PRN
  Administered 2019-08-16 (×2): 1 mg via INTRAVENOUS

## 2019-08-16 MED ORDER — DEXAMETHASONE SODIUM PHOSPHATE 10 MG/ML IJ SOLN
INTRAMUSCULAR | Status: DC | PRN
  Administered 2019-08-16: 4 mg via INTRAVENOUS

## 2019-08-16 MED ORDER — LACTATED RINGERS IV SOLN: INTRAVENOUS | Status: DC

## 2019-08-16 MED ORDER — ALBUTEROL SULFATE (2.5 MG/3ML) 0.083% IN NEBU: 2.5000 mg | INHALATION_SOLUTION | Freq: Four times a day (QID) | RESPIRATORY_TRACT | Status: DC | PRN

## 2019-08-16 MED ORDER — KETAMINE HCL 10 MG/ML IJ SOLN
INTRAMUSCULAR | Status: AC
  Filled 2019-08-16: qty 1

## 2019-08-16 MED ORDER — LACTATED RINGERS IR SOLN
Status: DC | PRN
  Administered 2019-08-16: 1000 mL

## 2019-08-16 SURGICAL SUPPLY — 55 items
APPLIER CLIP ROT 13.4 12 LRG (CLIP) ×2
BAG LAPAROSCOPIC 12 15 PORT 16 (BASKET) ×1 IMPLANT
BAG RETRIEVAL 12/15 (BASKET) ×2
BENZOIN TINCTURE PRP APPL 2/3 (GAUZE/BANDAGES/DRESSINGS) ×2 IMPLANT
BLADE SURG SZ11 CARB STEEL (BLADE) ×2 IMPLANT
BNDG ADH 1X3 SHEER STRL LF (GAUZE/BANDAGES/DRESSINGS) ×12 IMPLANT
CABLE HIGH FREQUENCY MONO STRZ (ELECTRODE) IMPLANT
CHLORAPREP W/TINT 26 (MISCELLANEOUS) ×4 IMPLANT
CLIP APPLIE ROT 13.4 12 LRG (CLIP) ×1 IMPLANT
COVER SURGICAL LIGHT HANDLE (MISCELLANEOUS) ×2 IMPLANT
COVER WAND RF STERILE (DRAPES) IMPLANT
DRAPE UTILITY XL STRL (DRAPES) ×4 IMPLANT
ELECT REM PT RETURN 15FT ADLT (MISCELLANEOUS) ×2 IMPLANT
GAUZE 4X4 16PLY RFD (DISPOSABLE) ×2 IMPLANT
GLOVE BIOGEL PI IND STRL 6.5 (GLOVE) ×3 IMPLANT
GLOVE BIOGEL PI IND STRL 7.0 (GLOVE) ×3 IMPLANT
GLOVE BIOGEL PI INDICATOR 6.5 (GLOVE) ×3
GLOVE BIOGEL PI INDICATOR 7.0 (GLOVE) ×3
GLOVE SURG SS PI 6.0 STRL IVOR (GLOVE) ×2 IMPLANT
GLOVE SURG SS PI 7.0 STRL IVOR (GLOVE) ×4 IMPLANT
GOWN STRL REUS W/TWL LRG LVL3 (GOWN DISPOSABLE) ×4 IMPLANT
GOWN STRL REUS W/TWL XL LVL3 (GOWN DISPOSABLE) ×4 IMPLANT
GRASPER SUT TROCAR 14GX15 (MISCELLANEOUS) ×2 IMPLANT
HOVERMATT SINGLE USE (MISCELLANEOUS) ×2 IMPLANT
KIT BASIN OR (CUSTOM PROCEDURE TRAY) ×2 IMPLANT
KIT TURNOVER KIT A (KITS) IMPLANT
MARKER SKIN DUAL TIP RULER LAB (MISCELLANEOUS) ×2 IMPLANT
NEEDLE SPNL 22GX3.5 QUINCKE BK (NEEDLE) ×2 IMPLANT
PACK UNIVERSAL I (CUSTOM PROCEDURE TRAY) ×2 IMPLANT
PENCIL SMOKE EVACUATOR (MISCELLANEOUS) IMPLANT
RELOAD STAPLER BLUE 60MM (STAPLE) ×3 IMPLANT
RELOAD STAPLER GOLD 60MM (STAPLE) ×1 IMPLANT
RELOAD STAPLER GREEN 60MM (STAPLE) ×1 IMPLANT
SCISSORS LAP 5X45 EPIX DISP (ENDOMECHANICALS) IMPLANT
SET IRRIG TUBING LAPAROSCOPIC (IRRIGATION / IRRIGATOR) ×2 IMPLANT
SET TUBE SMOKE EVAC HIGH FLOW (TUBING) ×2 IMPLANT
SHEARS HARMONIC ACE PLUS 45CM (MISCELLANEOUS) ×2 IMPLANT
SLEEVE GASTRECTOMY 40FR VISIGI (MISCELLANEOUS) ×2 IMPLANT
SLEEVE XCEL OPT CAN 5 100 (ENDOMECHANICALS) ×4 IMPLANT
SOL ANTI FOG 6CC (MISCELLANEOUS) ×1 IMPLANT
SOLUTION ANTI FOG 6CC (MISCELLANEOUS) ×1
STAPLER ECHELON LONG 60 440 (INSTRUMENTS) ×2 IMPLANT
STAPLER RELOAD BLUE 60MM (STAPLE) ×6
STAPLER RELOAD GOLD 60MM (STAPLE) ×2
STAPLER RELOAD GREEN 60MM (STAPLE) ×2
STRIP CLOSURE SKIN 1/2X4 (GAUZE/BANDAGES/DRESSINGS) ×2 IMPLANT
SUT ETHIBOND 0 36 GRN (SUTURE) IMPLANT
SUT MNCRL AB 4-0 PS2 18 (SUTURE) ×2 IMPLANT
SUT VICRYL 0 TIES 12 18 (SUTURE) ×2 IMPLANT
SYR 20ML LL LF (SYRINGE) ×2 IMPLANT
TOWEL OR 17X26 10 PK STRL BLUE (TOWEL DISPOSABLE) ×2 IMPLANT
TOWEL OR NON WOVEN STRL DISP B (DISPOSABLE) ×2 IMPLANT
TROCAR BLADELESS 15MM (ENDOMECHANICALS) ×2 IMPLANT
TROCAR BLADELESS OPT 5 100 (ENDOMECHANICALS) ×2 IMPLANT
TUBING CONNECTING 10 (TUBING) ×2 IMPLANT

## 2019-08-16 NOTE — Anesthesia Procedure Notes (Signed)
Date/Time: 08/16/2019 8:55 AM Performed by: Cynda Familia, CRNA Oxygen Delivery Method: Simple face mask Placement Confirmation: positive ETCO2 and breath sounds checked- equal and bilateral Dental Injury: Teeth and Oropharynx as per pre-operative assessment

## 2019-08-16 NOTE — Progress Notes (Signed)
Lovenox teaching complete. All questions answered.

## 2019-08-16 NOTE — Op Note (Signed)
Preop Diagnosis: Obesity Class III  Postop Diagnosis: same  Procedure performed: laparoscopic Sleeve Gastrectomy  Assitant: Romana Juniper  Indications:  The patient is a 64 y.o. year-old morbidly obese female who has been followed in the Bariatric Clinic as an outpatient. This patient was diagnosed with morbid obesity with a BMI of Body mass index is 50.2 kg/m. and significant co-morbidities including hypertension.  The patient was counseled extensively in the Bariatric Outpatient Clinic and after a thorough explanation of the risks and benefits of surgery (including death from complications, bowel leak, infection such as peritonitis and/or sepsis, internal hernia, bleeding, need for blood transfusion, bowel obstruction, organ failure, pulmonary embolus, deep venous thrombosis, wound infection, incisional hernia, skin breakdown, and others entailed on the consent form) and after a compliant diet and exercise program, the patient was scheduled for an elective laparoscopic sleeve gastrectomy.  Description of Operation:  Following informed consent, the patient was taken to the operating room and placed on the operating table in the supine position.  She had previously received prophylactic antibiotics and subcutaneous heparin for DVT prophylaxis in the pre-op holding area.  After induction of general endotracheal anesthesia by the anesthesiologist, the patient underwent placement of sequential compression devices and an oro-gastric tube.  A timeout was confirmed by the surgery and anesthesia teams.  The patient was adequately padded at all pressure points and placed on a footboard to prevent slippage from the OR table during extremes of position during surgery.  She underwent a routine sterile prep and drape of her entire abdomen.    Next, A transverse incision was made under the left subcostal area and a 75mm optical viewing trocar was introduced into the peritoneal cavity. Pneumoperitoneum was applied  with a high flow and low pressure. A laparoscope was inserted to confirm placement. A extraperitoneal block was then placed at the lateral abdominal wall using exparel diluted with marcaine. 5 additional incisions were placed: 1 7mm trocar to the left of the midline. 1 additional 95mm trocar in the left lateral area, 1 47mm trocar in the right mid abdomen, 1 57mm trocar in the right subcostal area, and a Nathanson retractor was placed through a subxiphoid incision.  The fat pad at the GE junction was incised and the gastrodiaphragmatic ligament was divided using the Harmonic scalpel. Next, a hole was created through the lesser omentum along the greater curve of the stomach to enter the lesser sac. The vessels along the greater omentum were  Then ligated and divided using the Harmonic scalpel moving towards the spleen and then short gastric vessels were ligated and divided in the same fashion to fully mobilize the fundus. The left crus was identified to ensure completion of the dissection. Next the antrum was measured and dissection continued inferiorly along the greater curve towards the pylorus and stopped 6cm from the pylorus.   A 40Fr ViSiGi dilator was placed into the esophgaus and along the lesser curve of the stomach and placed on suction. 1 non-reinforced 71mm Green load echelon stapler(s) followed by 1 44mm Gold load echelon stapler(s) followed by 3 48mm blue load echelon stapler(s) were used to make the resection along the antrum being sure to stay well away from the angularis by angling the jaws of the stapler towards the greater curve and later completing the resection staying along the [place] and ensuring the fundus was not retained by appropriately retracting it lateral. Air was inserted through the [place] to perform a leak test showing no bubbles and a neutral lie  of the stomach.  The assistant then went and performed an upper endoscopy and leak test. No bubbles were seen and the sleeve and antrum  distended appropriately. The specimen was then placed in an endocatch bag and removed by the 50mm port. A hemoclip applier was applied to 4 areas of bleeding on the staple line. The fascia of the 36mm port was closed with a 0 vicryl by suture passer. Hemostasis was ensured. Pneumoperitoneum was evacuated, all ports were removed and all incisions closed with 4-0 monocryl suture in subcuticular fashion. Steristrips and bandaids were put in place for dressing. The patient awoke from anesthesia and was brought to pacu in stable condition. All counts were correct.  Estimated blood loss: <106ml  Specimens:  Sleeve gastrectomy  Local Anesthesia: 50 ml Exparel:0.5% Marcaine mix  Post-Op Plan:       Pain Management: PO, prn      Antibiotics: Prophylactic      Anticoagulation: Prophylactic, Starting now      Post Op Studies/Consults: Not applicable      Intended Discharge: within 48h      Intended Outpatient Follow-Up: Two Week      Intended Outpatient Studies: Not Applicable      Other: Not Applicable  Images:    Arta Bruce Abir Craine

## 2019-08-16 NOTE — Progress Notes (Signed)
PHARMACY CONSULT FOR:  Risk Assessment for Post-Discharge VTE Following Bariatric Surgery  Post-Discharge VTE Risk Assessment: This patient's probability of 30-day post-discharge VTE is increased due to the factors marked:   Female   x Age >/=60 years    BMI >/=50 kg/m2    CHF    Dyspnea at Rest    Paraplegia   x Non-gastric-band surgery    Operation Time >/=3 hr    Return to OR     Length of Stay >/= 3 d      Hx of VTE   Hypercoagulable condition   Significant venous stasis   Predicted probability of 30-day post-discharge VTE: 0.31%  Other patient-specific factors to consider:   Recommendation for Discharge: No pharmacologic prophylaxis post-discharge  Sharon [name] is a 64 y.o. female who underwent  laparoscopic sleeve gastrectomy on 08/16/19   Case start: 0752 Case end: 0843   Allergies  Allergen Reactions  . 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 Adhesive [Tape] Hives and Dermatitis    Can tolerate tegaderm  . Augmentin [Amoxicillin-Pot Clavulanate] Diarrhea and Nausea Only  . Latex Hives  . Neomycin-Bacitracin Zn-Polymyx Dermatitis    NEOSPORIN    Patient Measurements: Height: 5' (152.4 cm) Weight: 257 lb 0.9 oz (116.6 kg) IBW/kg (Calculated) : 45.5 Body mass index is 50.2 kg/m.  Recent Labs    08/16/19 0945  HGB 15.1*  HCT 47.5*   Estimated Creatinine Clearance: 77 mL/min (by C-G formula based on SCr of 0.85 mg/dL).    Past Medical History:  Diagnosis Date  . Biliary colic   . Carpal tunnel syndrome    bilateral  . Chronic allergic rhinitis   . Chronic urticaria   . Complication of anesthesia    confusion for a couple of days after waking after having multiple knee surgery's, does well with spinal  . Degenerative disk disease   . Diverticulosis of colon   . Fatty liver   . Fibrocystic breast disease 1996  . GERD (gastroesophageal reflux disease)   . History of colon polyps    HYPERPLASTIC  09-14-2015  . History of kidney stones    passed on own  . History of melanoma excision    left thigh ,inside 2009--- per pt also has had severeal excision of office for almost melanoma  . Migraine    since age 53 not as frequent now  . Moderate persistent asthma, uncomplicated    followed by dr gallagher (cone allergy and asthma)  . OA (osteoarthritis)    all joints  . SUI (stress urinary incontinence, female)   . Wears glasses      Medications Prior to Admission  Medication Sig Dispense Refill Last Dose  . ALPRAZolam (XANAX) 0.5 MG tablet TAKE 1 TABLET BY MOUTH TWICE DAILY AS NEEDED (Patient taking differently: Take 0.5 mg by mouth 2 (two) times daily as needed (anxiety.). ) 30 tablet 2 08/10/2019  . Calcium-Magnesium-Vitamin D (CALCIUM 1200+D3 PO) Take 1 tablet by mouth daily.   08/15/2019 at Unknown time  . fexofenadine-pseudoephedrine (ALLEGRA-D 24) 180-240 MG 24 hr tablet Take 1 tablet by mouth daily.   08/15/2019 at Unknown time  . fluticasone (FLONASE) 50 MCG/ACT nasal spray Place 2 sprays into both nostrils at bedtime.   08/15/2019 at Unknown time  . furosemide (LASIX) 40 MG tablet TAKE 1 & 1/2 (ONE & ONE-HALF) TABLETS BY MOUTH ONCE DAILY IN THE MORNING (Patient taking differently: Take 40 mg by mouth  daily. ) 45 tablet 5 08/15/2019 at Unknown time  . HYDROcodone-acetaminophen (NORCO) 10-325 MG tablet Take one half to one tablet up to qid. Must last one month (Patient taking differently: Take 0.5-1 tablets by mouth every 6 (six) hours as needed (pain.). Take one half to one tablet up to qid. Must last one month) 105 tablet 0 08/16/2019 at 0435  . methocarbamol (ROBAXIN) 500 MG tablet TAKE ONE TABLET BY MOUTH EVERY 6 HOURS AS NEEDED FOR  BACK  PAIN (Patient taking differently: Take 500 mg by mouth every 6 (six) hours as needed (back pain.). TAKE ONE TABLET BY MOUTH EVERY 6 HOURS AS NEEDED FOR  BACK  PAI) 60 tablet 2 Past Month at Unknown time  . Multiple Vitamin (MULITIVITAMIN WITH  MINERALS) TABS Take 1 tablet by mouth daily.    08/15/2019 at Unknown time  . nabumetone (RELAFEN) 750 MG tablet Take 1 tablet by mouth twice daily as needed for pain (Patient taking differently: Take 750 mg by mouth in the morning and at bedtime. ) 60 tablet 5 Past Month at Unknown time  . pantoprazole (PROTONIX) 40 MG tablet TAKE 1 TABLET BY MOUTH ONCE DAILY FOR  REFLUX 90 tablet 0 08/15/2019 at Unknown time  . potassium chloride (KLOR-CON) 10 MEQ tablet Take 1 tablet by mouth twice daily (Patient taking differently: Take 10 mEq by mouth in the morning and at bedtime. ) 180 tablet 1 08/15/2019 at Unknown time  . albuterol (PROVENTIL HFA;VENTOLIN HFA) 108 (90 Base) MCG/ACT inhaler Inhale 2 puffs into the lungs every 4 (four) hours as needed for wheezing or shortness of breath. 1 Inhaler 2 More than a month at Unknown time  . albuterol (PROVENTIL) (2.5 MG/3ML) 0.083% nebulizer solution USE ONE VIAL IN NEBULIZER EVERY 4 HOURS AS NEEDED FOR WHEEZING 50 vial 2 More than a month at Unknown time  . HYDROcodone-acetaminophen (NORCO) 10-325 MG tablet Take one half to one tablet up to qid. Must last one month (Patient not taking: Reported on 08/03/2019) 105 tablet 0 Not Taking at Unknown time  . HYDROcodone-acetaminophen (NORCO) 10-325 MG tablet Take one half to one tablet up to qid. Must last one month (Patient not taking: Reported on 08/03/2019) 105 tablet 0 Not Taking at Unknown time  . ondansetron (ZOFRAN-ODT) 8 MG disintegrating tablet DISSOLVE 1 TABLET IN MOUTH EVERY 8 HOURS AS NEEDED FOR NAUSEA OR VOMITING 30 tablet 5 More than a month at Unknown time       Napoleon Form 08/16/2019,10:41 AM

## 2019-08-16 NOTE — Progress Notes (Signed)
Pt reports she is unable to ambulate from stretcher to bed.

## 2019-08-16 NOTE — Progress Notes (Signed)
Patient has started water and PO medications. Patient has demonstrated IS use and walked to bathroom and is now up in the chair. POC reviewed with her and her daughter. All questions were answered.

## 2019-08-16 NOTE — Progress Notes (Addendum)
Discussed post op day goals with patient and bedside RN Hollie Beach) including ambulation, IS, diet progression, pain, and nausea control.  BSTOP education provided including BSTOP information guide, "Guide for Pain Management after your Bariatric Procedure".  Plan to go home with Lovenox, teaching kit ordered staff made aware.  Questions answered.

## 2019-08-16 NOTE — H&P (Signed)
Sharon [name] is an 64 y.o. female.   Chief Complaint: obesity HPI: 64 yo female with long history of obesity and severe osteoarthritis. She has completed all requirements and is ready to proceed with bariatric surgery.  Past Medical History:  Diagnosis Date  . Biliary colic   . Carpal tunnel syndrome    bilateral  . Chronic allergic rhinitis   . Chronic urticaria   . Complication of anesthesia    confusion for a couple of days after waking after having multiple knee surgery's, does well with spinal  . Degenerative disk disease   . Diverticulosis of colon   . Fatty liver   . Fibrocystic breast disease 1996  . GERD (gastroesophageal reflux disease)   . History of colon polyps    HYPERPLASTIC 09-14-2015  . History of kidney stones    passed on own  . History of melanoma excision    left thigh ,inside 2009--- per pt also has had severeal excision of office for almost melanoma  . Migraine    since age 21 not as frequent now  . Moderate persistent asthma, uncomplicated    followed by dr gallagher (cone allergy and asthma)  . OA (osteoarthritis)    all joints  . SUI (stress urinary incontinence, female)   . Wears glasses     Past Surgical History:  Procedure Laterality Date  . APPENDECTOMY  1973  . BREAST EXCISIONAL BIOPSY Right 1994;  10-01-2009   2011 per path mild ductal hyperplasia/ fibrocystic breast  . CHOLECYSTECTOMY N/A 01/30/2017   Procedure: LAPAROSCOPIC CHOLECYSTECTOMY;  Surgeon: Kieth Brightly Arta Bruce, MD;  Location: [facility];  Service: General;  Laterality: N/A;  . COLONOSCOPY N/A 09/14/2015   Procedure: COLONOSCOPY;  Surgeon: Daneil Dolin, MD;  Location: [facility];  Service: Endoscopy;  Laterality: N/A;  930 - moved to 9:15 - office to notify  . ESOPHAGOGASTRODUODENOSCOPY (EGD) WITH PROPOFOL N/A 11/29/2016   Procedure: ESOPHAGOGASTRODUODENOSCOPY (EGD) WITH PROPOFOL;  Surgeon: Doran Stabler, MD;  Location: [facility];  Service:  Gastroenterology;  Laterality: N/A;  . KNEE ARTHROSCOPY W/ MENISCECTOMY Bilateral left 12-19-2006;  right 01-26-2010  . TONSILLECTOMY  1959  . TOTAL KNEE ARTHROPLASTY Left 03/10/2007  . TOTAL KNEE ARTHROPLASTY  06/17/2011   Procedure: TOTAL KNEE ARTHROPLASTY;  Surgeon: Arther Abbott, MD;  Location: [facility];  Service: Orthopedics;  Laterality: Right;  [place]  . TOTAL KNEE REVISION Left 07/08/2010  (spinal anesthesia)  . TUBAL LIGATION Bilateral YRS AGO  . VAGINAL HYSTERECTOMY  1988    Family History  Problem Relation Age of Onset  . Hypertension Father   . Lung cancer Father 67       lung cancer  . Lung disease Other        family history   . Allergic rhinitis Daughter   . Food Allergy Grandchild   . Anesthesia problems Neg Hx   . Hypotension Neg Hx   . Malignant hyperthermia Neg Hx   . Pseudochol deficiency Neg Hx   . Colon cancer Neg Hx   . Angioedema Neg Hx   . Asthma Neg Hx   . Eczema Neg Hx   . Immunodeficiency Neg Hx   . Urticaria Neg Hx    Social History:  reports that she quit smoking about 13 years ago. Her smoking use included cigarettes. She started smoking about 47 years ago. She has a 25.00 pack-year smoking history. She has never used smokeless tobacco. She reports that she does not drink  alcohol or use drugs.  Allergies:  Allergies  Allergen Reactions  . 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 Adhesive [Tape] Hives and Dermatitis    Can tolerate tegaderm  . Augmentin [Amoxicillin-Pot Clavulanate] Diarrhea and Nausea Only  . Latex Hives  . Neomycin-Bacitracin Zn-Polymyx Dermatitis    NEOSPORIN    Medications Prior to Admission  Medication Sig Dispense Refill  . ALPRAZolam (XANAX) 0.5 MG tablet TAKE 1 TABLET BY MOUTH TWICE DAILY AS NEEDED (Patient taking differently: Take 0.5 mg by mouth 2 (two) times daily as needed (anxiety.). ) 30 tablet 2  . Calcium-Magnesium-Vitamin D (CALCIUM 1200+D3 PO) Take 1 tablet by mouth  daily.    . fexofenadine-pseudoephedrine (ALLEGRA-D 24) 180-240 MG 24 hr tablet Take 1 tablet by mouth daily.    . fluticasone (FLONASE) 50 MCG/ACT nasal spray Place 2 sprays into both nostrils at bedtime.    . furosemide (LASIX) 40 MG tablet TAKE 1 & 1/2 (ONE & ONE-HALF) TABLETS BY MOUTH ONCE DAILY IN THE MORNING (Patient taking differently: Take 40 mg by mouth daily. ) 45 tablet 5  . HYDROcodone-acetaminophen (NORCO) 10-325 MG tablet Take one half to one tablet up to qid. Must last one month (Patient taking differently: Take 0.5-1 tablets by mouth every 6 (six) hours as needed (pain.). Take one half to one tablet up to qid. Must last one month) 105 tablet 0  . methocarbamol (ROBAXIN) 500 MG tablet TAKE ONE TABLET BY MOUTH EVERY 6 HOURS AS NEEDED FOR  BACK  PAIN (Patient taking differently: Take 500 mg by mouth every 6 (six) hours as needed (back pain.). TAKE ONE TABLET BY MOUTH EVERY 6 HOURS AS NEEDED FOR  BACK  PAI) 60 tablet 2  . Multiple Vitamin (MULITIVITAMIN WITH MINERALS) TABS Take 1 tablet by mouth daily.     . nabumetone (RELAFEN) 750 MG tablet Take 1 tablet by mouth twice daily as needed for pain (Patient taking differently: Take 750 mg by mouth in the morning and at bedtime. ) 60 tablet 5  . pantoprazole (PROTONIX) 40 MG tablet TAKE 1 TABLET BY MOUTH ONCE DAILY FOR  REFLUX 90 tablet 0  . potassium chloride (KLOR-CON) 10 MEQ tablet Take 1 tablet by mouth twice daily (Patient taking differently: Take 10 mEq by mouth in the morning and at bedtime. ) 180 tablet 1  . albuterol (PROVENTIL HFA;VENTOLIN HFA) 108 (90 Base) MCG/ACT inhaler Inhale 2 puffs into the lungs every 4 (four) hours as needed for wheezing or shortness of breath. 1 Inhaler 2  . albuterol (PROVENTIL) (2.5 MG/3ML) 0.083% nebulizer solution USE ONE VIAL IN NEBULIZER EVERY 4 HOURS AS NEEDED FOR WHEEZING 50 vial 2  . HYDROcodone-acetaminophen (NORCO) 10-325 MG tablet Take one half to one tablet up to qid. Must last one month (Patient  not taking: Reported on 08/03/2019) 105 tablet 0  . HYDROcodone-acetaminophen (NORCO) 10-325 MG tablet Take one half to one tablet up to qid. Must last one month (Patient not taking: Reported on 08/03/2019) 105 tablet 0  . ondansetron (ZOFRAN-ODT) 8 MG disintegrating tablet DISSOLVE 1 TABLET IN MOUTH EVERY 8 HOURS AS NEEDED FOR NAUSEA OR VOMITING 30 tablet 5    No results found for this or any previous visit (from the past 48 hour(s)). No results found.  Review of Systems  Constitutional: Negative for chills and fever.  HENT: Negative for hearing loss.   Respiratory: Negative for cough.   Cardiovascular: Negative for chest pain and palpitations.  Gastrointestinal: Negative for abdominal pain, nausea and vomiting.  Genitourinary: Negative for dysuria and urgency.  Musculoskeletal: Negative for myalgias and neck pain.  Skin: Negative for rash.  Neurological: Negative for dizziness and headaches.  Hematological: Does not bruise/bleed easily.  Psychiatric/Behavioral: Negative for suicidal ideas.    Blood pressure (!) 141/117, pulse 89, temperature 98.6 F (37 C), temperature source Oral, resp. rate 18, height 5' (1.524 m), weight 116.6 kg, SpO2 98 %. Physical Exam  Vitals reviewed. Constitutional: She is oriented to person, place, and time. She appears well-developed and well-nourished.  HENT:  Head: Normocephalic and atraumatic.  Eyes: Pupils are equal, round, and reactive to light. Conjunctivae and EOM are normal.  Cardiovascular: Normal rate and regular rhythm.  Respiratory: Effort normal and breath sounds normal.  GI: Soft. Bowel sounds are normal. She exhibits no distension. There is no abdominal tenderness.  Musculoskeletal:        General: Normal range of motion.     Cervical back: Normal range of motion and neck supple.  Neurological: She is alert and oriented to person, place, and time.  Skin: Skin is warm and dry.  Psychiatric: She has a normal mood and affect. Her behavior is  normal.     Assessment/Plan 64 yo female with class III obesity. -lap sleeve gastrectomy -bariatric and ERAS protocol -inpatient admission  Mickeal Skinner, MD 08/16/2019, 7:11 AM

## 2019-08-16 NOTE — Discharge Instructions (Signed)
Enoxaparin injection What is this medicine? ENOXAPARIN (ee nox a PA rin) is used after knee, hip, or abdominal surgeries to prevent blood clotting. It is also used to treat existing blood clots in the lungs or in the veins. This medicine may be used for other purposes; ask your health care provider or pharmacist if you have questions. COMMON BRAND NAME(S): Lovenox What should I tell my health care provider before I take this medicine? They need to know if you have any of these conditions:  bleeding disorders, hemorrhage, or hemophilia  infection of the heart or heart valves  kidney or liver disease  previous stroke  prosthetic heart valve  recent surgery or delivery of a baby  ulcer in the stomach or intestine, diverticulitis, or other bowel disease  an unusual or allergic reaction to enoxaparin, heparin, pork or pork products, other medicines, foods, dyes, or preservatives  pregnant or trying to get pregnant  breast-feeding How should I use this medicine? This medicine is for injection under the skin. It is usually given by a health-care professional. You or a family member may be trained on how to give the injections. If you are to give yourself injections, make sure you understand how to use the syringe, measure the dose if necessary, and give the injection. To avoid bruising, do not rub the site where this medicine has been injected. Do not take your medicine more often than directed. Do not stop taking except on the advice of your doctor or health care professional. Make sure you receive a puncture-resistant container to dispose of the needles and syringes once you have finished with them. Do not reuse these items. Return the container to your doctor or health care professional for proper disposal. Talk to your pediatrician regarding the use of this medicine in children. Special care may be needed. Overdosage: If you think you have taken too much of this medicine contact a poison  control center or emergency room at once. NOTE: This medicine is only for you. Do not share this medicine with others. What if I miss a dose? If you miss a dose, take it as soon as you can. If it is almost time for your next dose, take only that dose. Do not take double or extra doses. What may interact with this medicine?  aspirin and aspirin-like medicines  certain medicines that treat or prevent blood clots  dipyridamole  NSAIDs, medicines for pain and inflammation, like ibuprofen or naproxen This list may not describe all possible interactions. Give your health care provider a list of all the medicines, herbs, non-prescription drugs, or dietary supplements you use. Also tell them if you smoke, drink alcohol, or use illegal drugs. Some items may interact with your medicine. What should I watch for while using this medicine? Visit your healthcare professional for regular checks on your progress. You may need blood work done while you are taking this medicine. Your condition will be monitored carefully while you are receiving this medicine. It is important not to miss any appointments. If you are going to need surgery or other procedure, tell your healthcare professional that you are using this medicine. Using this medicine for a long time may weaken your bones and increase the risk of bone fractures. Avoid sports and activities that might cause injury while you are using this medicine. Severe falls or injuries can cause unseen bleeding. Be careful when using sharp tools or knives. Consider using an Copy. Take special care brushing or flossing your  teeth. Report any injuries, bruising, or red spots on the skin to your healthcare professional. Wear a medical ID bracelet or chain. Carry a card that describes your disease and details of your medicine and dosage times. What side effects may I notice from receiving this medicine? Side effects that you should report to your doctor or health  care professional as soon as possible:  allergic reactions like skin rash, itching or hives, swelling of the face, lips, or tongue  bone pain  signs and symptoms of bleeding such as bloody or black, tarry stools; red or dark-brown urine; spitting up blood or brown material that looks like coffee grounds; red spots on the skin; unusual bruising or bleeding from the eye, gums, or nose  signs and symptoms of a blood clot such as chest pain; shortness of breath; pain, swelling, or warmth in the leg  signs and symptoms of a stroke such as changes in vision; confusion; trouble speaking or understanding; severe headaches; sudden numbness or weakness of the face, arm or leg; trouble walking; dizziness; loss of coordination Side effects that usually do not require medical attention (report to your doctor or health care professional if they continue or are bothersome):  hair loss  pain, redness, or irritation at site where injected This list may not describe all possible side effects. Call your doctor for medical advice about side effects. You may report side effects to FDA at 1-800-FDA-1088. Where should I keep my medicine? Keep out of the reach of children. Store at room temperature between 15 and 30 degrees C (59 and 86 degrees F). Do not freeze. If your injections have been specially prepared, you may need to store them in the refrigerator. Ask your pharmacist. Throw away any unused medicine after the expiration date. NOTE: This sheet is a summary. It may not cover all possible information. If you have questions about this medicine, talk to your doctor, pharmacist, or health care provider.  2020 Elsevier/Gold Standard (2017-05-08 11:25:34)     GASTRIC BYPASS/SLEEVE  Home Care Instructions   These instructions are to help you care for yourself when you go home.  Call: If you have any problems. . Call [phone] and ask for the surgeon on call . If you need immediate help, come to the ER  at [facility].  . Tell the ER staff that you are a new post-op gastric bypass or gastric sleeve patient   Signs and symptoms to report: . Severe vomiting or nausea o If you cannot keep down clear liquids for longer than 1 day, call your surgeon  . Abdominal pain that does not get better after taking your pain medication . Fever over 100.4 F with chills . Heart beating over 100 beats a minute . Shortness of breath at rest . Chest pain .  Redness, swelling, drainage, or foul odor at incision (surgical) sites .  If your incisions open or pull apart . Swelling or pain in calf (lower leg) . Diarrhea (Loose bowel movements that happen often), frequent watery, uncontrolled bowel movements . Constipation, (no bowel movements for 3 days) if this happens: Pick one o Milk of Magnesia, 2 tablespoons by mouth, 3 times a day for 2 days if needed o Stop taking Milk of Magnesia once you have a bowel movement o Call your doctor if constipation continues Or o Miralax  (instead of Milk of Magnesia) following the label instructions o Stop taking Miralax once you have a bowel movement o Call your doctor if constipation  continues . Anything you think is not normal   Normal side effects after surgery: . Unable to sleep at night or unable to focus . Irritability or moody . Being tearful (crying) or depressed These are common complaints, possibly related to your anesthesia medications that put you to sleep, stress of surgery, and change in lifestyle.  This usually goes away a few weeks after surgery.  If these feelings continue, call your primary care doctor.   Wound Care: You may have surgical glue, steri-strips, or staples over your incisions after surgery . Surgical glue:  Looks like a clear film over your incisions and will wear off a little at a time . Steri-strips: Strips of tape over your incisions. You may notice a yellowish color on the skin under the steri-strips. This is used to make the    steri-strips stick better. Do not pull the steri-strips off - let them fall off . Staples: Staples may be removed before you leave the hospital o If you go home with staples, call [facility], [phone] at for an appointment with your surgeon's nurse to have staples removed 10 days after surgery. . Showering: You may shower two (2) days after your surgery unless your surgeon tells you differently o Wash gently around incisions with warm soapy water, rinse well, and gently pat dry  o No tub baths until staples are removed, steri-strips fall off or glue is gone.    Medications: . Medications should be liquid or crushed if larger than the size of a dime . Extended release pills (medication that release a little bit at a time through the day) should NOT be crushed or cut. (examples include XL, ER, DR, SR) . Depending on the size and number of medications you take, you may need to space (take a few throughout the day)/change the time you take your medications so that you do not over-fill your pouch (smaller stomach) . Make sure you follow-up with your primary care doctor to make medication changes needed during rapid weight loss and life-style changes . If you have diabetes, follow up with the doctor that orders your diabetes medication(s) within one week after surgery and check your blood sugar regularly. . Do not drive while taking prescription pain medication  . It is ok to take Tylenol by the bottle instructions with your pain medicine or instead of your pain medicine as needed.  DO NOT TAKE NSAIDS (EXAMPLES OF NSAIDS:  IBUPROFREN/ NAPROXEN)  Diet:                    First 2 Weeks  You will see the dietician t about two (2) weeks after your surgery. The dietician will increase the types of foods you can eat if you are handling liquids well: . If you have severe vomiting or nausea and cannot keep down clear liquids lasting longer than 1 day, call your surgeon @ ([phone]) Protein  Shake . Drink at least 2 ounces of shake 5-6 times per day . Each serving of protein shakes (usually 8 - 12 ounces) should have: o 15 grams of protein  o And no more than 5 grams of carbohydrate  . Goal for protein each day: o Men = 80 grams per day o Women = 60 grams per day . Protein powder may be added to fluids such as non-fat milk or Lactaid milk or unsweetened Soy/Almond milk (limit to 35 grams added protein powder per serving)  Hydration . Slowly increase the   amount of water and other clear liquids as tolerated (See Acceptable Fluids) . Slowly increase the amount of protein shake as tolerated  .  Sip fluids slowly and throughout the day.  Do not use straws. . May use sugar substitutes in small amounts (no more than 6 - 8 packets per day; i.e. Splenda)  Fluid Goal . The first goal is to drink at least 8 ounces of protein shake/drink per day (or as directed by the nutritionist); some examples of protein shakes are [employer], [employer], EAS Edge HP, and Unjury. See handout from pre-op Bariatric Education Class: o Slowly increase the amount of protein shake you drink as tolerated o You may find it easier to slowly sip shakes throughout the day o It is important to get your proteins in first . Your fluid goal is to drink 64 - 100 ounces of fluid daily o It may take a few weeks to build up to this . 32 oz (or more) should be clear liquids  And  . 32 oz (or more) should be full liquids (see below for examples) . Liquids should not contain sugar, caffeine, or carbonation  Clear Liquids: . Water or Sugar-free flavored water (i.e. Fruit H2O, Propel) . Decaffeinated coffee or tea (sugar-free) . [employer], [employer], Minute [facility] . Sugar-free Jell-O . Bouillon or broth . Sugar-free Popsicle:   *Less than 20 calories each; Limit 1 per day  Full Liquids: Protein Shakes/Drinks + 2 choices per day of other full liquids . Full liquids must be: o No More Than 15  grams of Carbs per serving  o No More Than 3 grams of Fat per serving . Strained low-fat cream soup (except Cream of Potato or Tomato) . Non-Fat milk . Fat-free Lactaid Milk . Unsweetened Soy Or Unsweetened Almond Milk . Low Sugar yogurt (Dannon Lite & Fit, [place] yogurt; Oikos Triple Zero; Chobani Simply 100; Yoplait 100 calorie [place] - No Fruit on the Bottom)    Vitamins and Minerals . Start 1 day after surgery unless otherwise directed by your surgeon . 2 Chewable Bariatric Specific Multivitamin / Multimineral Supplement with iron (Example: Bariatric Advantage Multi EA) . Chewable Calcium with Vitamin D-3 (Example: 3 Chewable Calcium Plus 600 with Vitamin D-3) o Take 500 mg three (3) times a day for a total of 1500 mg each day o Do not take all 3 doses of calcium at one time as it may cause constipation, and you can only absorb 500 mg  at a time  o Do not mix multivitamins containing iron with calcium supplements; take 2 hours apart . Menstruating women and those with a history of anemia (a blood disease that causes weakness) may need extra iron o Talk with your doctor to see if you need more iron . Do not stop taking or change any vitamins or minerals until you talk to your dietitian or surgeon . Your Dietitian and/or surgeon must approve all vitamin and mineral supplements   Activity and Exercise: Limit your physical activity as instructed by your doctor.  It is important to continue walking at home.  During this time, use these guidelines: . Do not lift anything greater than ten (10) pounds for at least two (2) weeks . Do not go back to work or drive until Engineer, production says you can . You may have sex when you feel comfortable  o It is VERY important for female patients to use a reliable birth control method; fertility often increases after  surgery  o All hormonal birth control will be ineffective for 30 days after surgery due to medications given during surgery a barrier method must be  used. o Do not get pregnant for at least 18 months . Start exercising as soon as your doctor tells you that you can o Make sure your doctor approves any physical activity . Start with a simple walking program . Walk 5-15 minutes each day, 7 days per week.  . Slowly increase until you are walking 30-45 minutes per day Consider joining our [place] program. [phone] or email [email or web address]   Special Instructions Things to remember: . Use your CPAP when sleeping if this applies to you  . [facility] has two free Bariatric Surgery Support Groups that meet monthly o The 3rd Thursday of each month, 6 pm, [facility]  o The 2nd Friday of each month, 11:45 am in the private dining room in the basement of [employer] [facility] . It is very important to keep all follow up appointments with your surgeon, dietitian, primary care physician, and behavioral health practitioner . Routine follow up schedule with your surgeon include appointments at 2-3 weeks, 6-8 weeks, 6 months, and 1 year at a minimum.  Your surgeon may request to see you more often.   o After the first year, please follow up with your bariatric surgeon and dietitian at least once a year in order to maintain best weight loss results [facility]: [place]: [phone] Bariatric Nurse Coordinator: [phone]      Reviewed and Endorsed  by [facility] Patient Education Committee, June, 2016 Edits Approved: Aug, 2018

## 2019-08-16 NOTE — Op Note (Signed)
Preoperative diagnosis: laparoscopic sleeve gastrectomy  Postoperative diagnosis: Same   Procedure: Upper endoscopy   Surgeon: Kadden Osterhout A Lailoni Baquera, M.D.  Anesthesia: Gen.   Description of procedure: The endoscope was placed in the mouth and oropharynx and under endoscopic vision it was advanced to the esophagogastric junction which was identified at 38cm from the teeth.  The pouch was tensely insufflated while the upper abdomen was flooded with irrigation to perform a leak test, which was negative. No bubbles were seen.  The staple line was hemostatic and the lumen was evenly tubular without undue narrowing, angulation or twisting specifically at the incisura angularis. The lumen was decompressed and the scope was withdrawn without difficulty.    Sharon [name], M.D. General, Bariatric, & Minimally Invasive Surgery [facility]   

## 2019-08-16 NOTE — Transfer of Care (Signed)
Immediate Anesthesia Transfer of Care Note  Patient: Sharon [name]  Procedure(s) Performed: LAPAROSCOPIC GASTRIC SLEEVE RESECTION, Upper Endo, ERAS Pathway (N/A Abdomen)  Patient Location: PACU  Anesthesia Type:General  Level of Consciousness: sedated  Airway & Oxygen Therapy: Patient Spontanous Breathing and Patient connected to face mask oxygen  Post-op Assessment: Report given to RN and Post -op Vital signs reviewed and stable  Post vital signs: Reviewed and stable  Last Vitals:  Vitals Value Taken Time  BP 170/84 08/16/19 0904  Temp    Pulse 71 08/16/19 0907  Resp 14 08/16/19 0907  SpO2 100 % 08/16/19 0907  Vitals shown include unvalidated device data.  Last Pain:  Vitals:   08/16/19 0614  TempSrc:   PainSc: 9       Patients Stated Pain Goal: 8 ([id number])  Complications: No apparent anesthesia complications

## 2019-08-16 NOTE — Anesthesia Procedure Notes (Signed)
Procedure Name: Intubation Date/Time: 08/16/2019 7:27 AM Performed by: Cynda Familia, CRNA Pre-anesthesia Checklist: Patient identified, Emergency Drugs available, Suction available and Patient being monitored Patient Re-evaluated:Patient Re-evaluated prior to induction Oxygen Delivery Method: Circle System Utilized Preoxygenation: Pre-oxygenation with 100% oxygen Induction Type: IV induction Ventilation: Mask ventilation without difficulty Laryngoscope Size: Miller and 2 Grade View: Grade I Tube type: Oral Number of attempts: 1 Airway Equipment and Method: Stylet Placement Confirmation: ETT inserted through vocal cords under direct vision,  positive ETCO2 and breath sounds checked- equal and bilateral Secured at: 21 cm Tube secured with: Tape Dental Injury: Teeth and Oropharynx as per pre-operative assessment  Comments: Smooth IV induction Hatchette-- intubation AM CRNA atraumatic-- teeth and mouth as preop -- irregular surfaces on front teeth-- bilat BS

## 2019-08-16 NOTE — Anesthesia Postprocedure Evaluation (Signed)
Anesthesia Post Note  Patient: Sharon [name]  Procedure(s) Performed: LAPAROSCOPIC GASTRIC SLEEVE RESECTION, Upper Endo, ERAS Pathway (N/A Abdomen)     Patient location during evaluation: PACU Anesthesia Type: General Level of consciousness: awake and sedated Pain management: pain level controlled Vital Signs Assessment: post-procedure vital signs reviewed and stable Respiratory status: spontaneous breathing Cardiovascular status: stable Postop Assessment: no apparent nausea or vomiting Anesthetic complications: no    Last Vitals:  Vitals:   08/16/19 1233 08/16/19 1323  BP: (!) 144/62 (!) 141/61  Pulse: 76 77  Resp: 17 17  Temp:    SpO2: 97% 100%    Last Pain:  Vitals:   08/16/19 1020  TempSrc: Oral  PainSc:    Pain Goal: Patients Stated Pain Goal: 8 (08/16/19 [id number])                 Huston Foley

## 2019-08-17 LAB — COMPREHENSIVE METABOLIC PANEL
ALT: 39 U/L (ref 0–44)
AST: 32 U/L (ref 15–41)
Albumin: 3.4 g/dL — ABNORMAL LOW (ref 3.5–5.0)
Alkaline Phosphatase: 63 U/L (ref 38–126)
Anion gap: 8 (ref 5–15)
BUN: 14 mg/dL (ref 8–23)
CO2: 27 mmol/L (ref 22–32)
Calcium: 8.7 mg/dL — ABNORMAL LOW (ref 8.9–10.3)
Chloride: 105 mmol/L (ref 98–111)
Creatinine, Ser: 0.78 mg/dL (ref 0.44–1.00)
GFR calc Af Amer: >60 mL/min (ref >60)
GFR calc non Af Amer: >60 mL/min (ref >60)
Glucose, Bld: 130 mg/dL — ABNORMAL HIGH (ref 70–99)
Potassium: 3.7 mmol/L (ref 3.5–5.1)
Sodium: 140 mmol/L (ref 135–145)
Total Bilirubin: 0.9 mg/dL (ref 0.3–1.2)
Total Protein: 6.8 g/dL (ref 6.5–8.1)

## 2019-08-17 LAB — CBC WITH DIFFERENTIAL/PLATELET
Abs Immature Granulocytes: 0.02 10*3/uL (ref 0.00–0.07)
Basophils Absolute: 0 10*3/uL (ref 0.0–0.1)
Basophils Relative: 0 %
Eosinophils Absolute: 0 10*3/uL (ref 0.0–0.5)
Eosinophils Relative: 0 %
HCT: 40.7 % (ref 36.0–46.0)
Hemoglobin: 13.2 g/dL (ref 12.0–15.0)
Immature Granulocytes: 0 %
Lymphocytes Relative: 15 %
Lymphs Abs: 1.5 10*3/uL (ref 0.7–4.0)
MCH: 32.7 pg (ref 26.0–34.0)
MCHC: 32.4 g/dL (ref 30.0–36.0)
MCV: 100.7 fL — ABNORMAL HIGH (ref 80.0–100.0)
Monocytes Absolute: 1.2 10*3/uL — ABNORMAL HIGH (ref 0.1–1.0)
Monocytes Relative: 12 %
Neutro Abs: 7.4 10*3/uL (ref 1.7–7.7)
Neutrophils Relative %: 73 %
Platelets: 205 10*3/uL (ref 150–400)
RBC: 4.04 MIL/uL (ref 3.87–5.11)
RDW: 12.5 % (ref 11.5–15.5)
WBC: 10.1 10*3/uL (ref 4.0–10.5)
nRBC: 0 % (ref 0.0–0.2)

## 2019-08-17 MED ORDER — GABAPENTIN 100 MG PO CAPS: 200.0000 mg | ORAL_CAPSULE | Freq: Two times a day (BID) | ORAL | 0 refills | Status: DC

## 2019-08-17 MED ORDER — ENOXAPARIN SODIUM 40 MG/0.4ML ~~LOC~~ SOLN: 40.0000 mg | SUBCUTANEOUS | 0 refills | Status: DC

## 2019-08-17 NOTE — Progress Notes (Signed)
Patient alert and oriented, pain is controlled. Patient is tolerating fluids, advanced to protein shake today, patient is tolerating well.  Reviewed Gastric sleeve discharge instructions with patient and patient is able to articulate understanding.  Provided information on BELT program, Support Group and [facility] outpatient pharmacy. All questions answered, will continue to monitor.  Total fluid intake 960 Per dehydration protocol call back one week post op

## 2019-08-17 NOTE — Evaluation (Signed)
Physical Therapy Evaluation Patient Details Name: LENOLA CAPITANO MRN: [id number] DOB: 1956-04-18 Today's Date: 08/17/2019   History of Present Illness  64 year old female admitted for undergoing bariatric surgery (08/16/2019); PMH severe OA, morbid obesity, HTN, complext med history but includes carpal tunnel bil, biliary colic, chronic allergi rhinitis, chronic urticaria, DDD, diverticultis, fatty liver, mealanoma, migraines, SUI- see chart for additional history.- multiple joint surgeries.  Clinical Impression  Patient resting in bed when PT arrived. Daughter at bedside. Both stated "she is going home today after your assessment". Patient states that she is pending left hip surgery and that she has had HH, but that the services signed off on her explaining that "she would not get better in her walking until she was able to have hip surgery". She did agree to the assessment and was instructed in basic LE ex format- printed sheet with pictures and commentary provided, with good return demo. Practiced IS and able to achieve 1250 MIL with good return demo. She lives with daughter- and house does have a basement (she does not use), tub-shower with seat, elevated commode height, BS commode, RW, grab bars in tub. 3 outside steps to house with no safety rails. She has had multiple falls, the most recent one being one month ago. Daughter explained they had "arranged the house for optimal safety". She is generally weak, but states that they do not want any HH as "it will not do any good until she has the surgery on her left hip."    Follow Up Recommendations No PT follow up    Equipment Recommendations  None recommended by PT(has equipment at home)    Recommendations for Other Services       Precautions / Restrictions Precautions Precautions: Fall Precaution Comments: She has had multiple falls, the most recent being one month ago Restrictions Weight Bearing Restrictions: No      Mobility  Bed  Mobility Overal bed mobility: Needs Assistance Bed Mobility: Rolling;Sidelying to Sit;Supine to Sit;Sit to Supine;Sit to Sidelying Rolling: Min guard;Supervision Sidelying to sit: Supervision;Min guard Supine to sit: Min guard;Supervision Sit to supine: Min guard;Supervision Sit to sidelying: Supervision;Min guard General bed mobility comments: Once in seated, takes a few minutes to "acclimate"  Transfers Overall transfer level: Needs assistance Equipment used: 1 person hand held assist(bed to BS chair- as her LEFT hip limits any distance walking) Transfers: Sit to/from Stand Sit to Stand: Min assist            Ambulation/Gait Ambulation/Gait assistance: Min guard Gait Distance (Feet): 8 Feet   Gait Pattern/deviations: Step-to pattern;Shuffle;Wide base of support   Gait velocity interpretation: 1.31 - 2.62 ft/sec, indicative of limited community Conservation officer, historic buildings [place] (Stroke Patients Only)       Balance Overall balance assessment: Mild deficits observed, not formally tested                                           Pertinent Vitals/Pain Pain Assessment: 0-10 Pain Score: 3  Pain Location: Abdomen- surgical site Pain Descriptors / Indicators: Aching;Pressure Pain Intervention(s): Premedicated before session    Home Living Family/patient expects to be discharged to:: Private residence Living Arrangements: Alone   Type of Home: House Home Access: Stairs to enter Entrance Stairs-Rails: None Entrance Stairs-Number of  Steps: 3 Home Layout: Two level(includes basement, but states she does not go into basement.) Home Equipment: Shower seat;Grab bars - tub/shower;Walker - 2 wheels;Walker - 4 wheels      Prior Function Level of Independence: Independent(Daugther does all driving, and she orders groceris online.)               Hand Dominance   Dominant Hand: Right    Extremity/Trunk  Assessment        Lower Extremity Assessment Lower Extremity Assessment: Generalized weakness    Cervical / Trunk Assessment Cervical / Trunk Assessment: Kyphotic  Communication   Communication: No difficulties  Cognition Arousal/Alertness: Awake/alert   Overall Cognitive Status: Within Functional Limits for tasks assessed                                 General Comments: Daughter present at bedside. States that they anticipate her discharge today. She relates that the house has been arranged for optimal safety.      General Comments General comments (skin integrity, edema, etc.): Monitor  skin and joint integrity. Edema noted in both LEs which she says has occured ever since her knee surgeries- "something failed in lymph system causing edema"    Exercises General Exercises - Lower Extremity Ankle Circles/Pumps: AROM;Supine;Both;10 reps Quad Sets: AROM;Both;10 reps;Supine Gluteal Sets: AROM;10 reps Short Arc Quad: AROM;Right;10 reps;Supine Heel Slides: AROM;Right;10 reps;Supine Hip ABduction/ADduction: AROM;Right;10 reps;Supine Other Exercises Other Exercises: Practied IS and able to achieve 1250 mil on good return demo. Printed ex sheet issued with full instructions and return demo- pictures and commentary.   Assessment/Plan    PT Assessment Patent does not need any further PT services(She has had HH, but given pending hip surgery- she relates that her [facility] therapists signed off on her case stating that there was nothing that could be better until she has the hip surgery.)  PT Problem List         PT Treatment Interventions      PT Goals (Current goals can be found in the Care Plan section)  Acute Rehab PT Goals Patient Stated Goal: Just to get to go home and get my BMI in range for my hip surgery. PT Goal Formulation: All assessment and education complete, DC therapy Time For Goal Achievement: 08/17/19    Frequency  1 x visit   Barriers to discharge         Co-evaluation               AM-PAC PT "6 Clicks" Mobility  Outcome Measure Help needed turning from your back to your side while in a flat bed without using bedrails?: A Little Help needed moving from lying on your back to sitting on the side of a flat bed without using bedrails?: A Little Help needed moving to and from a bed to a chair (including a wheelchair)?: A Little Help needed standing up from a chair using your arms (e.g., wheelchair or bedside chair)?: A Little Help needed to walk in hospital room?: A Lot Help needed climbing 3-5 steps with a railing? : A Lot 6 Click Score: 16    End of Session   Activity Tolerance: Patient tolerated treatment well Patient left: in chair;with call bell/phone within reach Nurse Communication: Mobility status PT Visit Diagnosis: Muscle weakness (generalized) (M62.81);Repeated falls (R29.6);Other abnormalities of gait and mobility (R26.89);Unsteadiness on feet (R26.81)    Time: 1000-1033 PT Time Calculation (min) (ACUTE  ONLY): 33 min   Charges:   PT Evaluation $PT Eval Moderate Complexity: 1 Mod PT Treatments $Therapeutic Exercise: 8-22 mins        Rollen Sox, PT # [phone] CGV cell  Casandra Doffing 08/17/2019, 10:46 AM

## 2019-08-17 NOTE — Progress Notes (Signed)
Patient alert and oriented, Post op day 1.  Provided support and encouragement.  Encouraged pulmonary toilet, ambulation and small sips of liquids.  Completed 12 ounces of bari clear fluid and protein shake overnight.  PT eval this am.  Patient up to chair with walker.  Pain controlled, daughter at bedside.  All questions answered.  Will continue to monitor.

## 2019-08-17 NOTE — Progress Notes (Signed)
D/C instructions given to patient by Bariatric nurse. Patient had no questions. NT or writer will wheel patient out once family comes to pick her up

## 2019-08-23 ENCOUNTER — Telehealth (HOSPITAL_COMMUNITY): Payer: Self-pay

## 2019-08-23 NOTE — Telephone Encounter (Signed)
Patient called to discuss post bariatric surgery follow up questions.  See below:   1.  Tell me about your pain and pain management?chronic pain has medication from orthopedist, only pain in largest incision pulling discomfort  2.  Let's talk about fluid intake.  How much total fluid are you taking in?47+ fluids  3.  How much protein have you taken in the last 2 days?60 grams of protein  4.  Have you had nausea?  Tell me about when have experienced nausea and what you did to help?denies  5.  Has the frequency or color changed with your urine?urine is clear  6.  Tell me what your incisions look like?no problem  7.  Have you been passing gas? BM?bm since coming home, took miralax   8.  If a problem or question were to arise who would you call?  Do you know contact numbers for [place], [facility], and [facility]?aware of how to contact all services  9.  How has the walking going?moving as tolerated  10.  How are your vitamins and calcium going?  How are you taking them?taking mvi and calcium  Reminded of [facility] appointment

## 2019-08-26 DIAGNOSIS — Z029 Encounter for administrative examinations, unspecified: Secondary | ICD-10-CM

## 2019-08-31 ENCOUNTER — Encounter: Payer: PPO | Attending: General Surgery | Admitting: Skilled Nursing Facility1

## 2019-08-31 ENCOUNTER — Other Ambulatory Visit: Payer: Self-pay

## 2019-08-31 DIAGNOSIS — J45909 Unspecified asthma, uncomplicated: Secondary | ICD-10-CM | POA: Insufficient documentation

## 2019-08-31 DIAGNOSIS — Z87891 Personal history of nicotine dependence: Secondary | ICD-10-CM | POA: Insufficient documentation

## 2019-08-31 DIAGNOSIS — E785 Hyperlipidemia, unspecified: Secondary | ICD-10-CM | POA: Insufficient documentation

## 2019-08-31 DIAGNOSIS — I10 Essential (primary) hypertension: Secondary | ICD-10-CM | POA: Diagnosis not present

## 2019-08-31 DIAGNOSIS — Z8261 Family history of arthritis: Secondary | ICD-10-CM | POA: Diagnosis not present

## 2019-08-31 DIAGNOSIS — Z8249 Family history of ischemic heart disease and other diseases of the circulatory system: Secondary | ICD-10-CM | POA: Diagnosis not present

## 2019-08-31 DIAGNOSIS — Z6841 Body Mass Index (BMI) 40.0 and over, adult: Secondary | ICD-10-CM | POA: Diagnosis not present

## 2019-08-31 DIAGNOSIS — K219 Gastro-esophageal reflux disease without esophagitis: Secondary | ICD-10-CM | POA: Diagnosis not present

## 2019-08-31 DIAGNOSIS — Z836 Family history of other diseases of the respiratory system: Secondary | ICD-10-CM | POA: Insufficient documentation

## 2019-08-31 DIAGNOSIS — Z85820 Personal history of malignant melanoma of skin: Secondary | ICD-10-CM | POA: Diagnosis not present

## 2019-08-31 DIAGNOSIS — Z79899 Other long term (current) drug therapy: Secondary | ICD-10-CM | POA: Insufficient documentation

## 2019-08-31 DIAGNOSIS — Z713 Dietary counseling and surveillance: Secondary | ICD-10-CM | POA: Diagnosis not present

## 2019-08-31 DIAGNOSIS — M1990 Unspecified osteoarthritis, unspecified site: Secondary | ICD-10-CM | POA: Insufficient documentation

## 2019-08-31 DIAGNOSIS — Z87442 Personal history of urinary calculi: Secondary | ICD-10-CM | POA: Diagnosis not present

## 2019-08-31 DIAGNOSIS — E669 Obesity, unspecified: Secondary | ICD-10-CM

## 2019-09-01 NOTE — Discharge Summary (Signed)
Physician Discharge Summary  Sharon [name] [id number] DOB: 14-Oct-1955 DOA: 08/16/2019  PCP: Sharon Kirschner, MD  Admit date: 08/16/2019 Discharge date: 09/01/2019  Recommendations for Outpatient Follow-up:  1. (include homehealth, outpatient follow-up instructions, specific recommendations for PCP to follow-up on, etc.)  Follow-up Information    Sharon [name], Sharon Bruce, MD. Go on 09/10/2019.   Specialty: General Surgery Why: at 920 am.  Please arrive 15 minutes prior to your appointment.  Thank you. Contact information: Sharon [name] [address] [phone]        [facility], [place]. Go on 10/01/2019.   Specialty: General Surgery Why: Your appointment is scheduled with Dr Sharon [name] at 910 am,  Please arrive 15 minutes prior to your appointment.  Thank you Contact information: Sharon [name] [place] [address] [phone]          Discharge Diagnoses:  Active Problems:   Morbid obesity ([place])   Surgical Procedure: laparoscopic sleeve gastrectomy, upper endoscopy  Discharge Condition: Good Disposition: Home  Diet recommendation: Postoperative sleeve gastrectomy diet (liquids only)  Filed Weights   08/16/19 0542 08/16/19 0553  Weight: 116.6 kg 116.6 kg     Hospital Course:  The patient was admitted after undergoing laparoscopic sleeve gastrectomy. POD 0 she ambulated well. POD 1 she was started on the water diet protocol and tolerated 400 ml in the first shift. Once meeting the water amount she was advanced to bariatric protein shakes which they tolerated and were discharged home POD 1.  Treatments: surgery: laparoscopic sleeve gastrectomy  Discharge Instructions  Discharge Instructions    Ambulate hourly while awake   Complete by: As directed    Call MD for:  difficulty breathing, headache or visual disturbances   Complete by: As directed    Call MD for:  persistant dizziness or light-headedness   Complete  by: As directed    Call MD for:  persistant nausea and vomiting   Complete by: As directed    Call MD for:  redness, tenderness, or signs of infection (pain, swelling, redness, odor or green/yellow discharge around incision site)   Complete by: As directed    Call MD for:  severe uncontrolled pain   Complete by: As directed    Call MD for:  temperature >101 F   Complete by: As directed    Diet bariatric full liquid   Complete by: As directed    Discharge wound care:   Complete by: As directed    Remove Bandaids tomorrow, ok to shower tomorrow. Steristrips may fall off in 1-3 weeks.   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   albuterol (2.5 MG/3ML) 0.083%  nebulizer solution Commonly known as: PROVENTIL USE ONE VIAL IN NEBULIZER EVERY 4 HOURS AS NEEDED FOR WHEEZING   ALPRAZolam 0.5 MG tablet Commonly known as: XANAX TAKE 1 TABLET BY MOUTH TWICE DAILY AS NEEDED What changed:   how much to take  how to take this  when to take this  reasons to take this  additional instructions   CALCIUM 1200+D3 PO Take 1 tablet by mouth daily.   enoxaparin 40 MG/0.4ML injection Commonly known as: LOVENOX Inject 0.4 mLs (40 mg total) into the skin daily for 14  doses.   fexofenadine-pseudoephedrine 180-240 MG 24 hr tablet Commonly known as: ALLEGRA-D 24 Take 1 tablet by mouth daily.   fluticasone 50 MCG/ACT nasal spray Commonly known as: FLONASE Place 2 sprays into both nostrils at bedtime.   gabapentin 100 MG capsule Commonly known as: NEURONTIN Take 2 capsules (200 mg total) by mouth every 12 (twelve) hours.   HYDROcodone-acetaminophen 10-325 MG tablet Commonly known as: NORCO Take one half to one tablet up to qid. Must last one month What changed:   how much to take  how to take this  when to take this  reasons to take this   HYDROcodone-acetaminophen 10-325 MG tablet Commonly known as: NORCO Take one half to one tablet up to qid. Must last one month What changed: Another medication with the same name was changed. Make sure you understand how and when to take each.   HYDROcodone-acetaminophen 10-325 MG tablet Commonly known as: NORCO Take one half to one tablet up to qid. Must last one month What changed: Another medication with the same name was changed. Make sure you understand how and when to take each.   methocarbamol 500 MG tablet Commonly known as: ROBAXIN TAKE ONE TABLET BY MOUTH EVERY 6 HOURS AS NEEDED FOR  BACK  PAIN What changed:   how much to take  how to take this  when to take this  reasons to take this  additional instructions   multivitamin with minerals Tabs tablet Take 1 tablet by mouth daily.   nabumetone 750 MG tablet Commonly known as: RELAFEN Take 1 tablet by mouth twice daily as needed for pain What changed:   when to take this  additional instructions   ondansetron 8 MG disintegrating tablet Commonly known as: ZOFRAN-ODT DISSOLVE 1 TABLET IN MOUTH EVERY 8 HOURS AS NEEDED FOR NAUSEA OR VOMITING   pantoprazole 40 MG tablet Commonly known as: PROTONIX TAKE 1 TABLET BY MOUTH ONCE DAILY FOR  REFLUX            Discharge Care Instructions  (From admission, onward)          Start     Ordered   08/17/19 0000  Discharge wound care:    Comments: Remove Bandaids tomorrow, ok to shower tomorrow. Steristrips may fall off in 1-3 weeks.   08/17/19 1001         Follow-up Information    Sharon [name], Sharon Bruce, MD. Go on 09/10/2019.   Specialty: General Surgery Why: at 920 am.  Please arrive 15 minutes prior to your appointment.  Thank you. Contact information: [place] [address] [phone]        Surgery, [place]. Go on 10/01/2019.   Specialty: General Surgery Why: Your appointment is scheduled with Dr Sharon [name] at 910 am,  Please arrive 15 minutes prior to your appointment.  Thank you Contact information: [place] [address]  [phone]            The results of significant diagnostics from this hospitalization (including imaging, microbiology, ancillary and laboratory) are listed below for reference.    Significant Diagnostic Studies: No results found.  Labs: Basic Metabolic Panel: No results for input(s): NA, K, CL, CO2, GLUCOSE, BUN, CREATININE, CALCIUM, MG, PHOS in the last 168 hours. Liver Function Tests: No results for input(s): AST, ALT, ALKPHOS, BILITOT, PROT, ALBUMIN in the last 168 hours.  CBC: No results for input(s): WBC, NEUTROABS, HGB, HCT, MCV, PLT in the last 168 hours.  CBG: No results for input(s): GLUCAP in the last 168 hours.  Active Problems:   Morbid obesity ([place])   VTE plan: no chemical prophylaxis recommended, lovenox given due to immobility ([email or web address])  Time coordinating discharge: 15 min

## 2019-09-01 NOTE — Progress Notes (Signed)
2 Week Post-Operative Nutrition Class   Patient was seen on 07/21/18 for Post-Operative Nutrition education at the [facility].    Surgery date: 08/16/2019 Surgery type: sleeve Start weight at [facility]: 258 Weight today: 248.8   Body Composition Scale 08/31/2019  Total Body Fat % 50  Visceral Fat 24  Fat-Free Mass % 49.9   Total Body Water % 39.4   Muscle-Mass lbs 25.8  Body Fat Displacement          Torso  lbs 77.2         Left Leg  lbs 15.4         Right Leg  lbs 15.4         Left Arm  lbs 7.7         Right Arm   lbs 7.7     The following the learning objectives were met by the patient during this course:  Identifies Phase 3 (Soft, High Proteins) Dietary Goals and will begin from 2 weeks post-operatively to 2 months post-operatively  Identifies appropriate sources of fluids and proteins   States protein recommendations and appropriate sources post-operatively  Identifies the need for appropriate texture modifications, mastication, and bite sizes when consuming solids  Identifies appropriate multivitamin and calcium sources post-operatively  Describes the need for physical activity post-operatively and will follow MD recommendations  States when to call healthcare provider regarding medication questions or post-operative complications   Handouts given during class include:  Phase 3A: Soft, High Protein Diet Handout   Follow-Up Plan: Patient will follow-up at [facility] in 6 weeks for 2 month post-op nutrition visit for diet advancement per MD.

## 2019-09-06 ENCOUNTER — Telehealth: Payer: Self-pay | Admitting: Skilled Nursing Facility1

## 2019-09-06 NOTE — Telephone Encounter (Signed)
RD called pt to verify fluid intake once starting soft, solid proteins 2 week post-bariatric surgery.  ° °Daily Fluid intake: 64+ °Daily Protein intake: 60+ ° °Concerns/issues:  ° °None stated °

## 2019-09-24 ENCOUNTER — Telehealth: Payer: Self-pay | Admitting: Family Medicine

## 2019-09-24 MED ORDER — HYDROCODONE-ACETAMINOPHEN 10-325 MG PO TABS: ORAL_TABLET | ORAL | 0 refills | Status: DC

## 2019-09-24 NOTE — Telephone Encounter (Signed)
Pt requesting 1 month refill of her Vicodin to be sent to [facility] [address]  Pt states her regular pharmacy [facility]) has none in stock & will not be able to fill this time (they told pt they weren't sure when their next shipment would be received)  Pt does not want her 3 prescriptions sent to [facility], just one month  Please advise & call pt when done

## 2019-09-24 NOTE — Telephone Encounter (Signed)
3 scripts last sent in in November. Pt states script on file at [place] is expired and [facility] told her they were out of meds and could not fill. She would like to get one script of hydrocodone sent to [facility] [address] in [place]. States she most days only takes one tablet, sometimes 2 a day, rarely does 3 or 4 a day. Pt states she is waiting to get a hip replacement and this is the cause of her pain. She just had weight lost surgery and has 30 some more pounds to lose before she can get hip replacement. I did explain to pt that she needed pain management every 3 months when she asked if she needed a visit. She wanted to know if dr taylor would do a virtual for pain management since she has transportation issues.

## 2019-09-24 NOTE — Telephone Encounter (Signed)
Pt notified and appt scheduled.

## 2019-09-28 ENCOUNTER — Telehealth (INDEPENDENT_AMBULATORY_CARE_PROVIDER_SITE_OTHER): Payer: PPO | Admitting: Family Medicine

## 2019-09-28 ENCOUNTER — Telehealth: Payer: Self-pay | Admitting: *Deleted

## 2019-09-28 ENCOUNTER — Other Ambulatory Visit: Payer: Self-pay

## 2019-09-28 DIAGNOSIS — G894 Chronic pain syndrome: Secondary | ICD-10-CM

## 2019-09-28 MED ORDER — HYDROCODONE-ACETAMINOPHEN 10-325 MG PO TABS: ORAL_TABLET | ORAL | 0 refills | Status: DC

## 2019-09-28 NOTE — Telephone Encounter (Signed)
Ms. jacqui, irias are scheduled for a virtual visit with your provider today.    Just as we do with appointments in the office, we must obtain your consent to participate.  Your consent will be active for this visit and any virtual visit you may have with one of our providers in the next 365 days.    If you have a MyChart account, I can also send a copy of this consent to you electronically.  All virtual visits are billed to your insurance company just like a traditional visit in the office.  As this is a virtual visit, video technology does not allow for your provider to perform a traditional examination.  This may limit your provider's ability to fully assess your condition.  If your provider identifies any concerns that need to be evaluated in person or the need to arrange testing such as labs, EKG, etc, we will make arrangements to do so.    Although advances in technology are sophisticated, we cannot ensure that it will always work on either your end or our end.  If the connection with a video visit is poor, we may have to switch to a telephone visit.  With either a video or telephone visit, we are not always able to ensure that we have a secure connection.   I need to obtain your verbal consent now.   Are you willing to proceed with your visit today?   LAMIRA PATTEE has provided verbal consent on 09/28/2019 for a virtual visit (video or telephone).   Mitzie Na, RN 09/28/2019  2:45 PM

## 2019-09-28 NOTE — Progress Notes (Signed)
   Subjective:    Patient ID: Sharon [name], female    DOB: 09-Aug-1955, 64 y.o.   MRN: [id number]  HPI  This patient was seen today for chronic pain  The medication list was reviewed and updated.   -Compliance with medication: yes  - Number patient states they take daily: 1-3 a day  -when was the last dose patient took? today  The patient was advised the importance of maintaining medication and not using illegal substances with these.  Here for refills and follow up  The patient was educated that we can provide 3 monthly scripts for their medication, it is their responsibility to follow the instructions.  Side effects or complications from medications: none  Patient is aware that pain medications are meant to minimize the severity of the pain to allow their pain levels to improve to allow for better function. They are aware of that pain medications cannot totally remove their pain.   Virtual Visit via Video Note  I connected with Sharon [name] on 09/28/19 at  3:00 PM EDT by a video enabled telemedicine application and verified that I am speaking with the correct person using two identifiers.  Location: Patient: home Provider: office   I discussed the limitations of evaluation and management by telemedicine and the availability of in person appointments. The patient expressed understanding and agreed to proceed.  History of Present Illness:    Observations/Objective:   Assessment and Plan:   Follow Up Instructions:    I discussed the assessment and treatment plan with the patient. The patient was provided an opportunity to ask questions and all were answered. The patient agreed with the plan and demonstrated an understanding of the instructions.   The patient was advised to call back or seek an in-person evaluation if the symptoms worsen or if the condition fails to improve as anticipated.  I provided 20 minutes of non-face-to-face time during this  encounter.   Patient compliant with pain medication. Continues to experience the pain which led to initiation of analgesic intervention. No significant negative side effects. States definitely needs the pain medication to maintain current level of functioning. Does not receive controlled substance pain medication elsewhere.       Review of Systems No headache no shortness of breath    Objective:   Physical Exam   Virtual     Assessment & Plan:  Impression 1 chronic hip pain.  Patient needs hip replacement.  Her orthopedic surgeon insisted on substantial weight loss.  Has just underwent bariatric surgery and has lost a fair amount of weight with another 30 pounds ago.  Is now down to approximately 2 narcotic tablets per day.  Meds refilled follow-up in 3 months

## 2019-10-04 DIAGNOSIS — E669 Obesity, unspecified: Secondary | ICD-10-CM | POA: Diagnosis not present

## 2019-10-04 DIAGNOSIS — R69 Illness, unspecified: Secondary | ICD-10-CM | POA: Diagnosis not present

## 2019-10-04 DIAGNOSIS — Z9884 Bariatric surgery status: Secondary | ICD-10-CM | POA: Diagnosis not present

## 2019-10-04 DIAGNOSIS — K912 Postsurgical malabsorption, not elsewhere classified: Secondary | ICD-10-CM | POA: Diagnosis not present

## 2019-10-05 ENCOUNTER — Telehealth: Payer: Self-pay | Admitting: Dietician

## 2019-10-05 NOTE — Telephone Encounter (Signed)
Patient called concerned about her weight. Patient had laparoscopic gastric sleeve resection 08/16/2019 and expected to have lost more weight than she has thus far. We plan to meet next week for follow up appointment to further discuss nutrition and dietary recommendations.    Nat Christen [place]) Lydiah Pong, MS, RD, LDN

## 2019-10-11 ENCOUNTER — Other Ambulatory Visit: Payer: Self-pay

## 2019-10-11 ENCOUNTER — Encounter: Payer: Self-pay | Admitting: Dietician

## 2019-10-11 ENCOUNTER — Encounter: Payer: PPO | Attending: General Surgery | Admitting: Dietician

## 2019-10-11 DIAGNOSIS — Z87442 Personal history of urinary calculi: Secondary | ICD-10-CM | POA: Diagnosis not present

## 2019-10-11 DIAGNOSIS — I10 Essential (primary) hypertension: Secondary | ICD-10-CM | POA: Diagnosis not present

## 2019-10-11 DIAGNOSIS — J45909 Unspecified asthma, uncomplicated: Secondary | ICD-10-CM | POA: Insufficient documentation

## 2019-10-11 DIAGNOSIS — Z85820 Personal history of malignant melanoma of skin: Secondary | ICD-10-CM | POA: Insufficient documentation

## 2019-10-11 DIAGNOSIS — Z8249 Family history of ischemic heart disease and other diseases of the circulatory system: Secondary | ICD-10-CM | POA: Insufficient documentation

## 2019-10-11 DIAGNOSIS — Z87891 Personal history of nicotine dependence: Secondary | ICD-10-CM | POA: Insufficient documentation

## 2019-10-11 DIAGNOSIS — K219 Gastro-esophageal reflux disease without esophagitis: Secondary | ICD-10-CM | POA: Diagnosis not present

## 2019-10-11 DIAGNOSIS — Z6841 Body Mass Index (BMI) 40.0 and over, adult: Secondary | ICD-10-CM | POA: Insufficient documentation

## 2019-10-11 DIAGNOSIS — Z713 Dietary counseling and surveillance: Secondary | ICD-10-CM | POA: Insufficient documentation

## 2019-10-11 DIAGNOSIS — E785 Hyperlipidemia, unspecified: Secondary | ICD-10-CM | POA: Diagnosis not present

## 2019-10-11 DIAGNOSIS — M1990 Unspecified osteoarthritis, unspecified site: Secondary | ICD-10-CM | POA: Diagnosis not present

## 2019-10-11 DIAGNOSIS — Z79899 Other long term (current) drug therapy: Secondary | ICD-10-CM | POA: Diagnosis not present

## 2019-10-11 DIAGNOSIS — E669 Obesity, unspecified: Secondary | ICD-10-CM

## 2019-10-11 DIAGNOSIS — Z8261 Family history of arthritis: Secondary | ICD-10-CM | POA: Diagnosis not present

## 2019-10-11 DIAGNOSIS — Z836 Family history of other diseases of the respiratory system: Secondary | ICD-10-CM | POA: Diagnosis not present

## 2019-10-11 NOTE — Progress Notes (Signed)
Bariatric Nutrition Follow-Up Visit Medical Nutrition Therapy   2 Months Post-Operative Sleeve Gastrectomy Surgery Surgery Date: 08/16/2019  Pt's Expectations of Surgery/ Goals: to lose enough weight to qualify for hip replacement Pt Reported Successes: none so far, states she is upset she hasn't lost more weight than she has   NUTRITION ASSESSMENT  Anthropometrics  Start weight at [facility]: 258 lbs (date: 03/24/2019) Today's weight: 243.6 lbs  Body Composition Scale 08/31/2019  Weight  lbs 248.8  BMI 48.6  Total Body Fat  % 50     Visceral Fat 24  Fat-Free Mass  % 50     Total Body Water  % 39.4     Muscle-Mass  lbs 25.8  Body Fat Displacement ---         Torso  lbs 77.2         Left Leg  lbs 15.4         Right Leg  lbs 15.4         Left Arm  lbs 7.7         Right Arm  lbs 7.7    Lifestyle & Dietary Hx Patient states she is sticking to the dietary guidelines and eating only protein sources. States she doesn't quite get 64 ounces of fluid in each day, but only drinks water. Is not physically active, uses a wheelchair or walker to get around.    24-Hr Dietary Recall First Meal: eggs  Snack: -  Second Meal: tuna pack Snack: Oikos Triple Zero yogurt (or cottage cheese, or stick cheese)   Third Meal: 3 oz chicken (or steak, or salmon)  Snack: - Beverages: water  Estimated daily fluid intake: 40-64 oz Estimated daily protein intake: ~60 g Supplements: MVI and calcium  Current average weekly physical activity: -   Post-Op Goals/ Signs/ Symptoms Using straws: - Drinking while eating: - Chewing/swallowing difficulties: - Changes in vision: - Changes to mood/headaches: - Hair loss/changes to skin/nails: - Difficulty focusing/concentrating: - Sweating: - Dizziness/lightheadedness: - Palpitations: -  Carbonated/caffeinated beverages: - N/V/D/C/Gas: - Abdominal pain: - Dumping syndrome: -   NUTRITION DIAGNOSIS  Overweight/obesity ([place]-3.3) related to past poor dietary  habits and physical inactivity as evidenced by completed bariatric surgery and following dietary guidelines for continued weight loss and healthy nutrition status.   NUTRITION INTERVENTION Nutrition counseling (C-1) and education (E-2) to facilitate bariatric surgery goals, including: . Diet advancement to the next phase (phase 4) now including non-starchy vegetables  . The importance of consuming adequate calories as well as certain nutrients daily due to the body's need for essential vitamins, minerals, and fats . The importance of daily physical activity and to reach a goal of at least 150 minutes of moderate to vigorous physical activity weekly (or as directed by their physician) due to benefits such as increased musculature and improved lab values  Handouts Provided Include   Phase 4: Protein + Non-Starchy Vegetables   Learning Style & Readiness for Change Teaching method utilized: Visual & Auditory  Demonstrated degree of understanding via: Teach Back  Barriers to learning/adherence to lifestyle change: None Identified    MONITORING & EVALUATION Dietary intake, weekly physical activity, body weight, and goals in 4 months.  Next Steps Patient is to follow-up in 4 months for 6 month post-op follow-up.

## 2019-10-11 NOTE — Patient Instructions (Signed)

## 2019-11-03 ENCOUNTER — Ambulatory Visit (INDEPENDENT_AMBULATORY_CARE_PROVIDER_SITE_OTHER): Payer: PPO | Admitting: Family Medicine

## 2019-11-03 ENCOUNTER — Encounter: Payer: Self-pay | Admitting: Family Medicine

## 2019-11-03 ENCOUNTER — Other Ambulatory Visit: Payer: Self-pay

## 2019-11-03 VITALS — BP 118/74 | HR 81 | Temp 97.0°F | Ht 60.0 in | Wt 243.0 lb

## 2019-11-03 DIAGNOSIS — F419 Anxiety disorder, unspecified: Secondary | ICD-10-CM | POA: Diagnosis not present

## 2019-11-03 DIAGNOSIS — G4700 Insomnia, unspecified: Secondary | ICD-10-CM | POA: Diagnosis not present

## 2019-11-03 DIAGNOSIS — M25552 Pain in left hip: Secondary | ICD-10-CM

## 2019-11-03 DIAGNOSIS — G894 Chronic pain syndrome: Secondary | ICD-10-CM

## 2019-11-03 DIAGNOSIS — G8929 Other chronic pain: Secondary | ICD-10-CM | POA: Diagnosis not present

## 2019-11-03 DIAGNOSIS — Z79891 Long term (current) use of opiate analgesic: Secondary | ICD-10-CM | POA: Diagnosis not present

## 2019-11-03 MED ORDER — NABUMETONE 750 MG PO TABS: 750.0000 mg | ORAL_TABLET | Freq: Two times a day (BID) | ORAL | 3 refills | Status: DC | PRN

## 2019-11-03 MED ORDER — TRAZODONE HCL 50 MG PO TABS: ORAL_TABLET | ORAL | 3 refills | Status: DC

## 2019-11-03 MED ORDER — ONDANSETRON 8 MG PO TBDP: ORAL_TABLET | ORAL | 1 refills | Status: DC

## 2019-11-03 NOTE — Progress Notes (Signed)
Patient ID: Sharon [name], female    DOB: 1956/04/30, 64 y.o.   MRN: [id number]   Chief Complaint  Patient presents with  . Anxiety  . Pain  . Insomnia   Subjective:    HPI   F/u on anxiety, chronic pain and insomnia. pt arrives for med checkup. States she needs refill on xanax, relafen and zofran.   Relafen for deg disc disease and arthritis. Pt needing a refill today. Taking zofran for bariatric sleeve surgery.   Still has some nausea occ.  08/16/19. Dr. Alvino Blood in [place] surgery. Slow going and not as mobile.  Pt stating needing a new hip and needing gatric surgery before can get hip surgery. H/o bilateral total knee replacement. Been on pain meds since 2012. Last pcp and they discussed possibly seeing rheumatology due to her pain.  Has seen ortho in past for injections.  Seeing Dr. Aline Brochure with orthopedics.   Seen pain management in past, last 5 yrs. Pt not wanting to try neuroleptics, stating it makes her skin crawl.  Pt stating only take 1 tablet per day of the hydrocodone, 1/2 tablet bid.  Anxiety- Xanax- pt only taking prn. Not taking it daily.  Medical History [place] has a past medical history of Biliary colic, Carpal tunnel syndrome, Chronic allergic rhinitis, Chronic urticaria, Complication of anesthesia, Degenerative disk disease, Diverticulosis of colon, Fatty liver, Fibrocystic breast disease (1996), GERD (gastroesophageal reflux disease), History of colon polyps, History of kidney stones, History of melanoma excision, Migraine, Moderate persistent asthma, uncomplicated, OA (osteoarthritis), SUI (stress urinary incontinence, female), and Wears glasses.   Outpatient Encounter Medications as of 11/03/2019  Medication Sig  . albuterol (PROVENTIL HFA;VENTOLIN HFA) 108 (90 Base) MCG/ACT inhaler Inhale 2 puffs into the lungs every 4 (four) hours as needed for wheezing or shortness of breath.  Marland Kitchen albuterol (PROVENTIL) (2.5 MG/3ML) 0.083% nebulizer  solution USE ONE VIAL IN NEBULIZER EVERY 4 HOURS AS NEEDED FOR WHEEZING  . ALPRAZolam (XANAX) 0.5 MG tablet TAKE 1 TABLET BY MOUTH TWICE DAILY AS NEEDED (Patient taking differently: Take 0.5 mg by mouth 2 (two) times daily as needed (anxiety.). )  . Calcium-Magnesium-Vitamin D (CALCIUM 1200+D3 PO) Take 1 tablet by mouth daily.  . fexofenadine-pseudoephedrine (ALLEGRA-D 24) 180-240 MG 24 hr tablet Take 1 tablet by mouth daily.  . fluticasone (FLONASE) 50 MCG/ACT nasal spray Place 2 sprays into both nostrils at bedtime.  Marland Kitchen HYDROcodone-acetaminophen (NORCO) 10-325 MG tablet Take one half to one tablet up to qid. Must last one month  . HYDROcodone-acetaminophen (NORCO) 10-325 MG tablet Take one half to one tablet up to qid. Must last one month  . HYDROcodone-acetaminophen (NORCO) 10-325 MG tablet Take one half to one tablet up to tid prn pain. Must last one month  . methocarbamol (ROBAXIN) 500 MG tablet TAKE ONE TABLET BY MOUTH EVERY 6 HOURS AS NEEDED FOR  BACK  PAIN (Patient taking differently: Take 500 mg by mouth every 6 (six) hours as needed (back pain.). TAKE ONE TABLET BY MOUTH EVERY 6 HOURS AS NEEDED FOR  BACK  PAI)  . nabumetone (RELAFEN) 750 MG tablet Take 1 tablet (750 mg total) by mouth 2 (two) times daily as needed. for pain  . ondansetron (ZOFRAN-ODT) 8 MG disintegrating tablet DISSOLVE 1 TABLET IN MOUTH EVERY 8 HOURS AS NEEDED FOR NAUSEA OR VOMITING  . OVER THE COUNTER MEDICATION bariatic vitamin  . pantoprazole (PROTONIX) 40 MG tablet TAKE 1 TABLET BY MOUTH ONCE DAILY FOR  REFLUX  . [DISCONTINUED]  gabapentin (NEURONTIN) 100 MG capsule Take 2 capsules (200 mg total) by mouth every 12 (twelve) hours.  . [DISCONTINUED] Multiple Vitamin (MULITIVITAMIN WITH MINERALS) TABS Take 1 tablet by mouth daily.   . [DISCONTINUED] nabumetone (RELAFEN) 750 MG tablet Take 1 tablet by mouth twice daily as needed for pain (Patient taking differently: Take 750 mg by mouth in the morning and at bedtime. )  .  [DISCONTINUED] ondansetron (ZOFRAN-ODT) 8 MG disintegrating tablet DISSOLVE 1 TABLET IN MOUTH EVERY 8 HOURS AS NEEDED FOR NAUSEA OR VOMITING  . traZODone (DESYREL) 50 MG tablet Take 1-2 tabs p.o. qhs for insomnia   No facility-administered encounter medications on file as of 11/03/2019.     Review of Systems  Constitutional: Negative for chills and fever.  HENT: Negative for congestion, rhinorrhea and sore throat.   Respiratory: Negative for cough, shortness of breath and wheezing.   Cardiovascular: Negative for chest pain and leg swelling.  Gastrointestinal: Negative for abdominal pain, diarrhea, nausea and vomiting.  Genitourinary: Negative for dysuria and frequency.  Musculoskeletal: Negative for arthralgias and back pain.  Skin: Negative for rash.  Neurological: Negative for dizziness, weakness and headaches.     Vitals BP 118/74   Pulse 81   Temp (!) 97 F (36.1 C)   Ht 5' (1.524 m)   Wt 243 lb (110.2 kg)   BMI 47.46 kg/m   Objective:   Physical Exam Vitals and nursing note reviewed.  Constitutional:      Appearance: Normal appearance.  HENT:     Head: Normocephalic and atraumatic.     Nose: Nose normal.     Mouth/Throat:     Mouth: Mucous membranes are moist.     Pharynx: Oropharynx is clear.  Eyes:     Extraocular Movements: Extraocular movements intact.     Conjunctiva/sclera: Conjunctivae normal.     Pupils: Pupils are equal, round, and reactive to light.  Cardiovascular:     Rate and Rhythm: Normal rate and regular rhythm.     Pulses: Normal pulses.     Heart sounds: Normal heart sounds.  Pulmonary:     Effort: Pulmonary effort is normal.     Breath sounds: Normal breath sounds. No wheezing, rhonchi or rales.  Musculoskeletal:        General: Normal range of motion.     Right lower leg: No edema.     Left lower leg: No edema.  Skin:    General: Skin is warm and dry.     Findings: No lesion or rash.  Neurological:     General: No focal deficit  present.     Mental Status: She is alert and oriented to person, place, and time.  Psychiatric:        Mood and Affect: Mood normal.        Behavior: Behavior normal.      Assessment and Plan   1. Chronic pain syndrome - Ambulatory referral to Pain Clinic  2. Chronic left hip pain - nabumetone (RELAFEN) 750 MG tablet; Take 1 tablet (750 mg total) by mouth 2 (two) times daily as needed. for pain  Dispense: 60 tablet; Refill: 3  3. Anxiety  4. Insomnia, unspecified type - traZODone (DESYREL) 50 MG tablet; Take 1-2 tabs p.o. qhs for insomnia  Dispense: 30 tablet; Refill: 3  5. Encounter for long-term opiate analgesic use - ToxASSURE Select 13 (MW), Urine   Advised pt needs to make her meds last till seen by pain management. No further refills on  pain meds from me.  Pt taking 10mg  norco tid prn pain. Advising to try trazodone for sleep and wean off the xanax.  Pt stating she is willing to try this. UDS ordered today. Pain contract given. Pt declining seeing psychiatry and not wanting to try buspar, ssri.   Pt requesting prn zofran due to her recent gastric sleeve surgery. Will give small course.   F/u in 3 months or prn.

## 2019-11-08 LAB — TOXASSURE SELECT 13 (MW), URINE

## 2019-11-22 ENCOUNTER — Encounter: Payer: Self-pay | Admitting: Family Medicine

## 2019-12-15 DIAGNOSIS — M129 Arthropathy, unspecified: Secondary | ICD-10-CM | POA: Diagnosis not present

## 2019-12-15 DIAGNOSIS — M25552 Pain in left hip: Secondary | ICD-10-CM | POA: Diagnosis not present

## 2019-12-15 DIAGNOSIS — E559 Vitamin D deficiency, unspecified: Secondary | ICD-10-CM | POA: Diagnosis not present

## 2019-12-15 DIAGNOSIS — Z79899 Other long term (current) drug therapy: Secondary | ICD-10-CM | POA: Diagnosis not present

## 2019-12-15 DIAGNOSIS — G894 Chronic pain syndrome: Secondary | ICD-10-CM | POA: Diagnosis not present

## 2019-12-27 ENCOUNTER — Other Ambulatory Visit: Payer: Self-pay | Admitting: Family Medicine

## 2019-12-27 MED ORDER — PANTOPRAZOLE SODIUM 40 MG PO TBEC: DELAYED_RELEASE_TABLET | ORAL | 0 refills | Status: DC

## 2019-12-27 NOTE — Telephone Encounter (Signed)
Prescription sent electronically to pharmacy. Patient notified. 

## 2019-12-27 NOTE — Telephone Encounter (Signed)
Wants refill on Protonix  [facility] on [address]

## 2020-01-05 DIAGNOSIS — M129 Arthropathy, unspecified: Secondary | ICD-10-CM | POA: Diagnosis not present

## 2020-01-05 DIAGNOSIS — Z79899 Other long term (current) drug therapy: Secondary | ICD-10-CM | POA: Diagnosis not present

## 2020-01-05 DIAGNOSIS — G894 Chronic pain syndrome: Secondary | ICD-10-CM | POA: Diagnosis not present

## 2020-01-05 DIAGNOSIS — M25552 Pain in left hip: Secondary | ICD-10-CM | POA: Diagnosis not present

## 2020-02-03 DIAGNOSIS — M25552 Pain in left hip: Secondary | ICD-10-CM | POA: Diagnosis not present

## 2020-02-03 DIAGNOSIS — G894 Chronic pain syndrome: Secondary | ICD-10-CM | POA: Diagnosis not present

## 2020-02-03 DIAGNOSIS — Z79899 Other long term (current) drug therapy: Secondary | ICD-10-CM | POA: Diagnosis not present

## 2020-02-08 ENCOUNTER — Other Ambulatory Visit: Payer: Self-pay

## 2020-02-08 ENCOUNTER — Encounter: Payer: PPO | Attending: General Surgery | Admitting: Skilled Nursing Facility1

## 2020-02-08 DIAGNOSIS — Z87891 Personal history of nicotine dependence: Secondary | ICD-10-CM | POA: Insufficient documentation

## 2020-02-08 DIAGNOSIS — K219 Gastro-esophageal reflux disease without esophagitis: Secondary | ICD-10-CM | POA: Diagnosis not present

## 2020-02-08 DIAGNOSIS — E669 Obesity, unspecified: Secondary | ICD-10-CM | POA: Insufficient documentation

## 2020-02-08 DIAGNOSIS — Z79899 Other long term (current) drug therapy: Secondary | ICD-10-CM | POA: Insufficient documentation

## 2020-02-08 DIAGNOSIS — Z6841 Body Mass Index (BMI) 40.0 and over, adult: Secondary | ICD-10-CM | POA: Diagnosis not present

## 2020-02-08 DIAGNOSIS — M1990 Unspecified osteoarthritis, unspecified site: Secondary | ICD-10-CM | POA: Insufficient documentation

## 2020-02-10 ENCOUNTER — Ambulatory Visit: Payer: PPO | Admitting: Orthopedic Surgery

## 2020-02-10 NOTE — Progress Notes (Signed)
Follow-up visit:  Post-Operative sleeve Surgery  Medical Nutrition Therapy:  Appt start time: 6:00pm end time:  7:00pm  Primary concerns today: Post-operative Bariatric Surgery Nutrition Management 6 Month Post-Op Class  Sx date: 08/16/2019   Body Composition Scale Date  Weight  lbs 229.8  Total Body Fat  % 48.4     Visceral Fat 21  Fat-Free Mass  % 51.5     Total Body Water  % 40.2     Muscle-Mass  lbs 25.7  BMI 44.7  Body Fat Displacement ---        Torso  lbs 68.9        Left Leg  lbs 13.7        Right Leg  lbs 13.7        Left Arm  lbs 6.8        Right Arm  lbs 6.8     Information Reviewed/ Discussed During Appointment: -Review of composition scale numbers -Fluid requirements (64-100 ounces) -Protein requirements (60-80g) -Strategies for tolerating diet -Advancement of diet to include Starchy vegetables -Barriers to inclusion of new foods -Inclusion of appropriate multivitamin and calcium supplements  -Exercise recommendations   Fluid intake: adequate   Medications: See List Supplementation: appropriate    Using straws: no Drinking while eating: no Having you been chewing well: yes Chewing/swallowing difficulties: no Changes in vision: no Changes to mood/headaches: no Hair loss/C[place]nges to skin/Changes to nails: no Any difficulty focusing or concentrating: no Sweating: no Dizziness/Lightheaded: no Palpitations: no  Carbonated beverages: no N/V/D/C/GAS: no Abdominal Pain: no Dumping syndrome: no  Recent physical activity:  ADL's  Progress Towards Goal(s):  In Progress  Handouts given during visit include:  Phase V diet Progression   Goals Sheet  The Benefits of Exercise are endless.....  Support Group Topics   Teaching Method Utilized:  Visual Auditory Hands on  Demonstrated degree of understanding via:  Teach Back   Monitoring/Evaluation:  Dietary intake, exercise, and body weight. Follow up in 3 months for 9 month post-op visit.

## 2020-02-17 ENCOUNTER — Other Ambulatory Visit: Payer: Self-pay

## 2020-02-17 ENCOUNTER — Ambulatory Visit: Payer: PPO | Admitting: Orthopedic Surgery

## 2020-02-17 ENCOUNTER — Encounter: Payer: Self-pay | Admitting: Orthopedic Surgery

## 2020-02-17 ENCOUNTER — Ambulatory Visit: Payer: PPO

## 2020-02-17 VITALS — BP 165/84 | HR 77 | Ht 60.0 in | Wt 231.0 lb

## 2020-02-17 DIAGNOSIS — M170 Bilateral primary osteoarthritis of knee: Secondary | ICD-10-CM

## 2020-02-17 DIAGNOSIS — Z96651 Presence of right artificial knee joint: Secondary | ICD-10-CM | POA: Diagnosis not present

## 2020-02-17 DIAGNOSIS — Z6841 Body Mass Index (BMI) 40.0 and over, adult: Secondary | ICD-10-CM

## 2020-02-17 DIAGNOSIS — M1612 Unilateral primary osteoarthritis, left hip: Secondary | ICD-10-CM

## 2020-02-17 DIAGNOSIS — Z96652 Presence of left artificial knee joint: Secondary | ICD-10-CM | POA: Diagnosis not present

## 2020-02-17 NOTE — Progress Notes (Signed)
Chief Complaint  Patient presents with  . Knee Pain    follow up bilateral knee xrays   Encounter Diagnoses  Name Primary?  . Bilateral primary osteoarthritis of knee Yes  . Status post right knee replacement 06/17/2011   . 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 Unilateral primary osteoarthritis, left hip   . Body mass index 45.0-49.9, adult ([place])   . Morbid obesity ([place])    Both knees were x-rayed they're doing fine other than a little tendinitis in the left knee  She is really here about her left hip she has had the bariatric surgery she is lost 50 pounds she is aware she needs to lose a little bit more to get down under 50 she is asking about seeing Dr. Ninfa Linden for total hip which we will make arrangements for  X-rays of her knees show the prostheses are aligned properly without loosening  BP (!) 165/84   Pulse 77   Ht 5' (1.524 m)   Wt 231 lb (104.8 kg)   BMI 45.11 kg/m  The patient meets the AMA guidelines for Morbid (severe) obesity with a BMI > 40.0 and I have recommended weight loss.  I discussed this with Zyla with her daughter present.  She complains that it is her hip.  Her symptoms are somewhat clouded by the fact that she still has pain in her buttock still has pain across the back that radiates down the side of the left leg along with the groin pain  She is aware of her degenerative disc disease.  She asked for Dr. Ninfa Linden for the anterior hip approach if that is feasible  So we are going to set her up for that.

## 2020-02-23 ENCOUNTER — Ambulatory Visit (INDEPENDENT_AMBULATORY_CARE_PROVIDER_SITE_OTHER): Payer: PPO | Admitting: Orthopaedic Surgery

## 2020-02-23 ENCOUNTER — Ambulatory Visit: Payer: Self-pay

## 2020-02-23 VITALS — Ht 60.0 in | Wt 231.0 lb

## 2020-02-23 DIAGNOSIS — M1612 Unilateral primary osteoarthritis, left hip: Secondary | ICD-10-CM

## 2020-02-23 DIAGNOSIS — M25552 Pain in left hip: Secondary | ICD-10-CM | POA: Diagnosis not present

## 2020-02-23 NOTE — Progress Notes (Signed)
Office Visit Note   Patient: Sharon [name]           Date of Birth: 05/19/1956           MRN: [id number] Visit Date: 02/23/2020              Requested by: Carole Civil, [address] [place],  [place] [address] PCP: Erven Colla, DO   Assessment & Plan: Visit Diagnoses:  1. Pain in left hip   2. Unilateral primary osteoarthritis, left hip   3. Severe obesity (BMI >= 40) (H[facility])     Plan: I do feel that the patient would benefit from hip replacement surgery.  I laid her in a supine position on the exam table and feel comfortable with an anterior approach to her left hip.  However, with her BMI of over 45, we would need to unfortunately delay surgery until her BMI is just below 40.  I totally understand how frustrating this is for her and if there was not restrictions from a insurance standpoint, I would be comfortable with proceeding with surgery.  This is surgery that I would want her to stay 1 or 2 nights for therapy purposes and for wound assessment purposes given her obesity.  We will see her back in 4 weeks with a repeat weight and BMI calculation.  All question concerns were answered and addressed.The patient meets the AMA guidelines for Morbid (severe) obesity with a BMI > 40.0 and I have recommended weight loss.  Follow-Up Instructions: Return in about 4 weeks (around 03/22/2020).   Orders:  Orders Placed This Encounter  Procedures  . XR HIP UNILAT W OR W/O PELVIS 1V LEFT   No orders of the defined types were placed in this encounter.     Procedures: No procedures performed   Clinical Data: No additional findings.   Subjective: Chief Complaint  Patient presents with  . Left Hip - Pain  The patient is a 64-year she has had multiple intra-articular steroid injections and is gotten to where these do not help her at all.  She did undergo bariatric surgery in June of this year and has lost weight since then.  Today her BMI is down to 45.11.  She does  ambulate with a rolling walker.  She is very frustrated that the severity of her pain with her hip and posterior proceed with hip replacement surgery in the near future.  She has had no other acute change in medical status.  HPI  Review of Systems She currently denies any headache, chest pain, shortness of breath, fever, chills, nausea, vomiting  Objective: Vital Signs: Ht 5' (1.524 m)   Wt 231 lb (104.8 kg)   BMI 45.11 kg/m   Physical Exam She is alert and oriented x3 and in no acute distress Ortho Exam Examination of her right hip is normal examination of her left hip shows severe pain with any attempts of internal and external rotation.  There is a significant amount of stiffness in the left hip with rotation.  Of note she does have bilateral knee replacements. Specialty Comments:  No specialty comments available.  Imaging: XR HIP UNILAT W OR W/O PELVIS 1V LEFT  Result Date: 02/23/2020 A low AP pelvis, para-articular osteophytes and overhanging of the acetabulum.  The right hip appears normal.    PMFS History: Patient Active Problem List   Diagnosis Date Noted  . Unilateral primary osteoarthritis, left hip 02/23/2020  . Seasonal and perennial allergic  rhinitis 03/06/2017  . Moderate persistent asthma, uncomplicated 10/93/2355  . Chronic nonseasonal allergic rhinitis due to fungal spores 12/02/2016  . Chronic urticaria 12/02/2016  . Anaphylactic shock due to adverse food reaction 12/02/2016  . History of colonic polyps   . Diverticulosis of colon without hemorrhage   . Asthma, mild intermittent 08/30/2015  . Encounter for screening colonoscopy 08/25/2015  . Wheezing 08/08/2015  . Gastroesophageal reflux disease 08/08/2015  . Obesity 07/14/2013  . Status post right knee replacement 06/17/2011 06/17/2013  . Status post left knee replacement 02/28/2007 06/17/2013  . DDD (degenerative disc disease), lumbosacral 10/06/2012  . Hip pain 10/06/2012  . Radicular leg pain  10/06/2012  . Bursitis of hip, right 06/25/2012  . Trochanteric bursitis of both hips 09/11/2011  . Left knee pain 03/20/2011  . OA (osteoarthritis) of knee 12/12/2010  . Mononeuritis of leg 10/24/2010  . Chronic pain 10/24/2010  . OTHER POSTSURGICAL STATUS OTHER 07/24/2010  . STRESS FRACTURE, TIBIA 04/11/2010  . TENOSYNOVITIS OF FOOT AND ANKLE 02/15/2010  . LOOSE BODY-KNEE 01/10/2010  . DERANGEMENT MENISCUS 01/01/2010  . [place] HEAD 01/01/2010  . SHOULDER PAIN 04/19/2009  . IMPINGEMENT SYNDROME 04/19/2009  . HERNIATED LUMBOSACRAL DISC 09/01/2007  . UNSPECIFIED NEURALGIA NEURITIS AND RADICULITIS 07/09/2007  . TOTAL KNEE FOLLOW-UP 06/25/2007  . Osteoarthrosis, unspecified whether generalized or localized, lower leg 03/09/2007  . KNEE PAIN 02/02/2007  . TEAR MEDIAL MENISCUS 02/02/2007   Past Medical History:  Diagnosis Date  . Biliary colic   . Carpal tunnel syndrome    bilateral  . Chronic allergic rhinitis   . Chronic urticaria   . Complication of anesthesia    confusion for a couple of days after waking after having multiple knee surgery's, does well with spinal  . Degenerative disk disease   . Diverticulosis of colon   . Fatty liver   . Fibrocystic breast disease 1996  . GERD (gastroesophageal reflux disease)   . History of colon polyps    HYPERPLASTIC 09-14-2015  . History of kidney stones    passed on own  . History of melanoma excision    left thigh ,inside 2009--- per pt also has had severeal excision of office for almost melanoma  . Migraine    since age 24 not as frequent now  . Moderate persistent asthma, uncomplicated    followed by dr gallagher (cone allergy and asthma)  . OA (osteoarthritis)    all joints  . SUI (stress urinary incontinence, female)   . Wears glasses     Family History  Problem Relation Age of Onset  . Hypertension Father   . Lung cancer Father 75       lung cancer  . Lung disease Other        family history   .  Allergic rhinitis Daughter   . Food Allergy Grandchild   . Anesthesia problems Neg Hx   . Hypotension Neg Hx   . Malignant hyperthermia Neg Hx   . Pseudochol deficiency Neg Hx   . Colon cancer Neg Hx   . Angioedema Neg Hx   . Asthma Neg Hx   . Eczema Neg Hx   . Immunodeficiency Neg Hx   . Urticaria Neg Hx     Past Surgical History:  Procedure Laterality Date  . APPENDECTOMY  1973  . BREAST EXCISIONAL BIOPSY Right 1994;  10-01-2009   2011 per path mild ductal hyperplasia/ fibrocystic breast  . CHOLECYSTECTOMY N/A 01/30/2017   Procedure: LAPAROSCOPIC CHOLECYSTECTOMY;  Surgeon: Gurney Maxin  Marjory Lies, MD;  Location: [facility];  Service: General;  Laterality: N/A;  . COLONOSCOPY N/A 09/14/2015   Procedure: COLONOSCOPY;  Surgeon: Daneil Dolin, MD;  Location: [facility];  Service: Endoscopy;  Laterality: N/A;  930 - moved to 9:15 - office to notify  . ESOPHAGOGASTRODUODENOSCOPY (EGD) WITH PROPOFOL N/A 11/29/2016   Procedure: ESOPHAGOGASTRODUODENOSCOPY (EGD) WITH PROPOFOL;  Surgeon: Doran Stabler, MD;  Location: [facility];  Service: Gastroenterology;  Laterality: N/A;  . KNEE ARTHROSCOPY W/ MENISCECTOMY Bilateral left 12-19-2006;  right 01-26-2010  . LAPAROSCOPIC GASTRIC SLEEVE RESECTION N/A 08/16/2019   Procedure: LAPAROSCOPIC GASTRIC SLEEVE RESECTION, Upper Endo, ERAS Pathway;  Surgeon: Kinsinger, Arta Bruce, MD;  Location: [facility];  Service: General;  Laterality: N/A;  . TONSILLECTOMY  1959  . TOTAL KNEE ARTHROPLASTY Left 03/10/2007  . TOTAL KNEE ARTHROPLASTY  06/17/2011   Procedure: TOTAL KNEE ARTHROPLASTY;  Surgeon: Arther Abbott, MD;  Location: [facility];  Service: Orthopedics;  Laterality: Right;  [place]  . TOTAL KNEE REVISION Left 07/08/2010  (spinal anesthesia)  . TUBAL LIGATION Bilateral YRS AGO  . VAGINAL HYSTERECTOMY  1988   Social History   Occupational History  . Occupation: Therapist, sports at [employer]  . Smoking status: Former Smoker    Packs/day:  1.00    Years: 25.00    Pack years: 25.00    Types: Cigarettes    Start date: 08/02/1972    Quit date: 05/27/2006    Years since quitting: 13.7  . Smokeless tobacco: Never Used  Vaping Use  . Vaping Use: Never used  Substance and Sexual Activity  . Alcohol use: No    Alcohol/week: 0.0 standard drinks  . Drug use: No  . Sexual activity: Yes    Birth control/protection: Surgical

## 2020-02-26 ENCOUNTER — Other Ambulatory Visit: Payer: Self-pay | Admitting: Family Medicine

## 2020-02-26 DIAGNOSIS — G8929 Other chronic pain: Secondary | ICD-10-CM

## 2020-02-28 ENCOUNTER — Telehealth: Payer: Self-pay | Admitting: Orthopaedic Surgery

## 2020-02-28 ENCOUNTER — Encounter: Payer: Self-pay | Admitting: Orthopaedic Surgery

## 2020-02-28 NOTE — Telephone Encounter (Signed)
Sharon [name] from [facility] called requesting a call back from Dr. Ninfa Linden only. Jackelyn Poling has medical/insurance questions about patient surgery concerning her BMI. Please call Debbie at [phone].

## 2020-03-02 DIAGNOSIS — Z79899 Other long term (current) drug therapy: Secondary | ICD-10-CM | POA: Diagnosis not present

## 2020-03-02 DIAGNOSIS — G894 Chronic pain syndrome: Secondary | ICD-10-CM | POA: Diagnosis not present

## 2020-03-02 DIAGNOSIS — M25552 Pain in left hip: Secondary | ICD-10-CM | POA: Diagnosis not present

## 2020-03-09 ENCOUNTER — Telehealth: Payer: Self-pay

## 2020-03-09 NOTE — Telephone Encounter (Signed)
Received a call from [facility] with [place] stating they have approved pt's Left Total hip replacement  If any questions best CB # [phone]

## 2020-03-10 DIAGNOSIS — E669 Obesity, unspecified: Secondary | ICD-10-CM | POA: Diagnosis not present

## 2020-03-10 DIAGNOSIS — Z9884 Bariatric surgery status: Secondary | ICD-10-CM | POA: Diagnosis not present

## 2020-03-13 ENCOUNTER — Encounter: Payer: Self-pay | Admitting: Orthopaedic Surgery

## 2020-03-14 ENCOUNTER — Telehealth: Payer: Self-pay | Admitting: Orthopaedic Surgery

## 2020-03-14 NOTE — Telephone Encounter (Signed)
Patient called and requesting a call back to see if patient still need weight in appt. Patient states she has been approved for surgery and didn't want to waste a appt if not needed. Please call patient at [phone].

## 2020-03-14 NOTE — Telephone Encounter (Signed)
She is not

## 2020-03-14 NOTE — Telephone Encounter (Signed)
I spoke with patient. She would like to know if you feel it is necessary for her to keep upcoming appointment with you? She said she did not want to waste your time or her time. She was not sure if you needed this appointment with her as a pre op appointment.

## 2020-03-14 NOTE — Telephone Encounter (Signed)
Is patient a bundle?

## 2020-03-14 NOTE — Telephone Encounter (Signed)
I agree with the patient.  She is aware of what the surgery involves with hip replacement surgery.  I can just see her at the time of surgery.

## 2020-03-15 NOTE — Telephone Encounter (Signed)
I called pt to advise of message below. I cx the appt she had for 03/22/20 with CB and now she is wanting to know when she could get scheduled for the replacement. I let her know I would forward the message to surgical scheduling to advise.

## 2020-03-22 ENCOUNTER — Ambulatory Visit: Payer: PPO | Admitting: Orthopaedic Surgery

## 2020-03-23 ENCOUNTER — Other Ambulatory Visit: Payer: Self-pay | Admitting: Family Medicine

## 2020-03-27 ENCOUNTER — Encounter: Payer: Self-pay | Admitting: Orthopaedic Surgery

## 2020-04-06 NOTE — Telephone Encounter (Signed)
I called patient and scheduled surgery. 

## 2020-04-10 NOTE — Patient Instructions (Addendum)
DUE TO COVID-19 ONLY ONE VISITOR IS ALLOWED TO COME WITH YOU AND STAY IN THE WAITING ROOM ONLY DURING PRE OP AND PROCEDURE DAY OF SURGERY. THE 1 VISITOR  MAY VISIT WITH YOU AFTER SURGERY IN YOUR PRIVATE ROOM DURING VISITING HOURS ONLY!  YOU NEED TO HAVE A COVID 19 TEST ON: 04/11/20 @ 11:45 AM , THIS TEST MUST BE DONE BEFORE SURGERY,  COVID TESTING SITE [place] [id number], IT IS ON THE RIGHT GOING OUT [address] APPROXIMATELY  2 MINUTES PAST [employer] ON THE RIGHT. ONCE YOUR COVID TEST IS COMPLETED,  PLEASE BEGIN THE QUARANTINE INSTRUCTIONS AS OUTLINED IN YOUR HANDOUT.                [name]    Your procedure is scheduled on: 04/14/20   Report to [facility] Main  Entrance   Report to short stay at: 5:30 AM     Call this number if you have problems the morning of surgery [phone]    Remember: NO SOLID FOOD AFTER MIDNIGHT THE NIGHT PRIOR TO SURGERY. NOTHING BY MOUTH EXCEPT CLEAR LIQUIDS UNTIL: 4:14 AM . PLEASE FINISH ENSURE DRINK PER SURGEON ORDER  WHICH NEEDS TO BE COMPLETED AT : 4:15 AM.  CLEAR LIQUID DIET   Foods Allowed                                                                     Foods Excluded  Coffee and tea, regular and decaf                             liquids that you cannot  Plain Jell-O any favor except red or purple                                           see through such as: Fruit ices (not with fruit pulp)                                     milk, soups, orange juice  Iced Popsicles                                    All solid food Carbonated beverages, regular and diet                                    Cranberry, grape and apple juices Sports drinks like Gatorade Lightly seasoned clear broth or consume(fat free) Sugar, honey syrup  Sample Menu Breakfast                                Lunch  Supper Cranberry juice                    Beef broth                             Chicken broth Jell-O                                     Grape juice                           Apple juice Coffee or tea                        Jell-O                                      Popsicle                                                Coffee or tea                        Coffee or tea  _____________________________________________________________________  BRUSH YOUR TEETH MORNING OF SURGERY AND RINSE YOUR MOUTH OUT, NO CHEWING GUM CANDY OR MINTS.     Take these medicines the morning of surgery with A SIP OF WATER: allegra. Use flonase and inhalers as usual.                               You may not have any metal on your body including hair pins and              piercings  Do not wear jewelry, make-up, lotions, powders or perfumes, deodorant             Do not wear nail polish on your fingernails.  Do not shave  48 hours prior to surgery.    Do not bring valuables to the hospital. [place].  Contacts, dentures or bridgework may not be worn into surgery.  Leave suitcase in the car. After surgery it may be brought to your room.     Patients discharged the day of surgery will not be allowed to drive home. IF YOU ARE HAVING SURGERY AND GOING HOME THE SAME DAY, YOU MUST HAVE AN ADULT TO DRIVE YOU HOME AND BE WITH YOU FOR 24 HOURS. YOU MAY GO HOME BY TAXI OR UBER OR ORTHERWISE, BUT AN ADULT MUST ACCOMPANY YOU HOME AND STAY WITH YOU FOR 24 HOURS.  Name and phone number of your driver:  Special Instructions: N/A              Please read over the following fact sheets you were given: _____________________________________________________________________        [facility] - Preparing for Surgery Before surgery, you can play an important role.  Because skin is not sterile, your skin needs to be as free of germs  as possible.  You can reduce the number of germs on your skin by washing with CHG (chlorahexidine gluconate) soap before  surgery.  CHG is an antiseptic cleaner which kills germs and bonds with the skin to continue killing germs even after washing. Please DO NOT use if you have an allergy to CHG or antibacterial soaps.  If your skin becomes reddened/irritated stop using the CHG and inform your nurse when you arrive at Short Stay. Do not shave (including legs and underarms) for at least 48 hours prior to the first CHG shower.  You may shave your face/neck. Please follow these instructions carefully:  1.  Shower with CHG Soap the night before surgery and the  morning of Surgery.  2.  If you choose to wash your hair, wash your hair first as usual with your  normal  shampoo.  3.  After you shampoo, rinse your hair and body thoroughly to remove the  shampoo.                           4.  Use CHG as you would any other liquid soap.  You can apply chg directly  to the skin and wash                       Gently with a scrungie or clean washcloth.  5.  Apply the CHG Soap to your body ONLY FROM THE NECK DOWN.   Do not use on face/ open                           Wound or open sores. Avoid contact with eyes, ears mouth and genitals (private parts).                       Wash face,  Genitals (private parts) with your normal soap.             6.  Wash thoroughly, paying special attention to the area where your surgery  will be performed.  7.  Thoroughly rinse your body with warm water from the neck down.  8.  DO NOT shower/wash with your normal soap after using and rinsing off  the CHG Soap.                9.  Pat yourself dry with a clean towel.            10.  Wear clean pajamas.            11.  Place clean sheets on your bed the night of your first shower and do not  sleep with pets. Day of Surgery : Do not apply any lotions/deodorants the morning of surgery.  Please wear clean clothes to the hospital/surgery center.  FAILURE TO FOLLOW THESE INSTRUCTIONS MAY RESULT IN THE CANCELLATION OF YOUR SURGERY PATIENT  SIGNATURE_________________________________  NURSE SIGNATURE__________________________________  ________________________________________________________________________   Adam Phenix  An incentive spirometer is a tool that can help keep your lungs clear and active. This tool measures how well you are filling your lungs with each breath. Taking long deep breaths may help reverse or decrease the chance of developing breathing (pulmonary) problems (especially infection) following:  A long period of time when you are unable to move or be active. BEFORE THE PROCEDURE   If the spirometer includes an indicator to show your best effort, your nurse or  respiratory therapist will set it to a desired goal.  If possible, sit up straight or lean slightly forward. Try not to slouch.  Hold the incentive spirometer in an upright position. INSTRUCTIONS FOR USE  1. Sit on the edge of your bed if possible, or sit up as far as you can in bed or on a chair. 2. Hold the incentive spirometer in an upright position. 3. Breathe out normally. 4. Place the mouthpiece in your mouth and seal your lips tightly around it. 5. Breathe in slowly and as deeply as possible, raising the piston or the ball toward the top of the column. 6. Hold your breath for 3-5 seconds or for as long as possible. Allow the piston or ball to fall to the bottom of the column. 7. Remove the mouthpiece from your mouth and breathe out normally. 8. Rest for a few seconds and repeat Steps 1 through 7 at least 10 times every 1-2 hours when you are awake. Take your time and take a few normal breaths between deep breaths. 9. The spirometer may include an indicator to show your best effort. Use the indicator as a goal to work toward during each repetition. 10. After each set of 10 deep breaths, practice coughing to be sure your lungs are clear. If you have an incision (the cut made at the time of surgery), support your incision when coughing  by placing a pillow or rolled up towels firmly against it. Once you are able to get out of bed, walk around indoors and cough well. You may stop using the incentive spirometer when instructed by your caregiver.  RISKS AND COMPLICATIONS  Take your time so you do not get dizzy or light-headed.  If you are in pain, you may need to take or ask for pain medication before doing incentive spirometry. It is harder to take a deep breath if you are having pain. AFTER USE  Rest and breathe slowly and easily.  It can be helpful to keep track of a log of your progress. Your caregiver can provide you with a simple table to help with this. If you are using the spirometer at home, follow these instructions: [place] IF:   You are having difficultly using the spirometer.  You have trouble using the spirometer as often as instructed.  Your pain medication is not giving enough relief while using the spirometer.  You develop fever of 100.5 F (38.1 C) or higher. SEEK IMMEDIATE MEDICAL CARE IF:   You cough up bloody sputum that had not been present before.  You develop fever of 102 F (38.9 C) or greater.  You develop worsening pain at or near the incision site. MAKE SURE YOU:   Understand these instructions.  Will watch your condition.  Will get help right away if you are not doing well or get worse. Document Released: 09/23/2006 Document Revised: 08/05/2011 Document Reviewed: 11/24/2006 [facility] Patient Information 2014 [place], [place].   ________________________________________________________________________

## 2020-04-10 NOTE — Progress Notes (Signed)
Pt. Needs orders for upcomming surgery.PST and lab. appointment on 04/11/20. Thanks.

## 2020-04-11 ENCOUNTER — Other Ambulatory Visit (HOSPITAL_COMMUNITY)
Admission: RE | Admit: 2020-04-11 | Discharge: 2020-04-11 | Disposition: A | Discharge status: Home / Self Care | Payer: PPO | Source: Ambulatory Visit | Attending: Orthopaedic Surgery | Admitting: Orthopaedic Surgery

## 2020-04-11 ENCOUNTER — Other Ambulatory Visit: Payer: Self-pay | Admitting: Physician Assistant

## 2020-04-11 ENCOUNTER — Encounter (HOSPITAL_COMMUNITY): Payer: Self-pay

## 2020-04-11 ENCOUNTER — Other Ambulatory Visit: Payer: Self-pay

## 2020-04-11 ENCOUNTER — Encounter (HOSPITAL_COMMUNITY)
Admission: RE | Admit: 2020-04-11 | Discharge: 2020-04-11 | Disposition: A | Discharge status: Home / Self Care | Payer: PPO | Source: Ambulatory Visit | Attending: Orthopaedic Surgery | Admitting: Orthopaedic Surgery

## 2020-04-11 DIAGNOSIS — Z01812 Encounter for preprocedural laboratory examination: Secondary | ICD-10-CM | POA: Insufficient documentation

## 2020-04-11 DIAGNOSIS — Z20822 Contact with and (suspected) exposure to covid-19: Secondary | ICD-10-CM | POA: Diagnosis not present

## 2020-04-11 HISTORY — DX: Malignant (primary) neoplasm, unspecified: C80.1

## 2020-04-11 LAB — CBC
HCT: 45.3 % (ref 36.0–46.0)
Hemoglobin: 15.3 g/dL — ABNORMAL HIGH (ref 12.0–15.0)
MCH: 33.5 pg (ref 26.0–34.0)
MCHC: 33.8 g/dL (ref 30.0–36.0)
MCV: 99.1 fL (ref 80.0–100.0)
Platelets: 200 10*3/uL (ref 150–400)
RBC: 4.57 MIL/uL (ref 3.87–5.11)
RDW: 12.2 % (ref 11.5–15.5)
WBC: 8.4 10*3/uL (ref 4.0–10.5)
nRBC: 0 % (ref 0.0–0.2)

## 2020-04-11 LAB — SARS CORONAVIRUS 2 (TAT 6-24 HRS): SARS Coronavirus 2: NEGATIVE

## 2020-04-11 LAB — SURGICAL PCR SCREEN
MRSA, PCR: NEGATIVE
Staphylococcus aureus: NEGATIVE

## 2020-04-11 NOTE — Progress Notes (Signed)
COVID Vaccine Completed: NO Date COVID Vaccine completed: COVID vaccine manufacturer: Pfizer    Moderna   [employer]   PCP - Dr. Elvia Collum Cardiologist - NO  Chest x-ray -  EKG -  Stress Test -  ECHO -  Cardiac Cath -  Pacemaker/ICD device last checked:  Sleep Study -  CPAP -   Fasting Blood Sugar -  Checks Blood Sugar _____ times a day  Blood Thinner Instructions: Aspirin Instructions: Last Dose:  Anesthesia review:   Patient denies shortness of breath, fever, cough and chest pain at PAT appointment   Patient verbalized understanding of instructions that were given to them at the PAT appointment. Patient was also instructed that they will need to review over the PAT instructions again at home before surgery.

## 2020-04-12 ENCOUNTER — Other Ambulatory Visit: Payer: Self-pay

## 2020-04-13 NOTE — Anesthesia Preprocedure Evaluation (Addendum)
Anesthesia Evaluation  Patient identified by MRN, date of birth, ID band Patient awake    Reviewed: Allergy & Precautions, NPO status , Patient's Chart, lab work & pertinent test results  Airway Mallampati: II  TM Distance: >3 FB Neck ROM: Full    Dental no notable dental hx.    Pulmonary asthma , former smoker,    Pulmonary exam normal breath sounds clear to auscultation       Cardiovascular negative cardio ROS Normal cardiovascular exam Rhythm:Regular Rate:Normal     Neuro/Psych  Headaches, negative psych ROS   GI/Hepatic Neg liver ROS, GERD  Medicated and Controlled,  Endo/Other  Morbid obesity  Renal/GU negative Renal ROS     Musculoskeletal  (+) Arthritis ,   Abdominal (+) + obese,   Peds  Hematology negative hematology ROS (+)   Anesthesia Other Findings osteoarthritis left hip  Reproductive/Obstetrics                            Anesthesia Physical Anesthesia Plan  ASA: III  Anesthesia Plan: Spinal   Post-op Pain Management:    Induction: Intravenous  PONV Risk Score and Plan: 2 and Ondansetron, Dexamethasone, Propofol infusion and Treatment may vary due to age or medical condition  Airway Management Planned: Simple Face Mask  Additional Equipment:   Intra-op Plan:   Post-operative Plan:   Informed Consent: I have reviewed the patients History and Physical, chart, labs and discussed the procedure including the risks, benefits and alternatives for the proposed anesthesia with the patient or authorized representative who has indicated his/her understanding and acceptance.     Dental advisory given  Plan Discussed with: CRNA  Anesthesia Plan Comments:         Anesthesia Quick Evaluation

## 2020-04-14 ENCOUNTER — Ambulatory Visit (HOSPITAL_COMMUNITY): Payer: PPO

## 2020-04-14 ENCOUNTER — Ambulatory Visit (HOSPITAL_COMMUNITY): Payer: PPO | Admitting: Certified Registered Nurse Anesthetist

## 2020-04-14 ENCOUNTER — Encounter (HOSPITAL_COMMUNITY)
Admission: RE | Disposition: A | Discharge status: Home / Self Care | Payer: Self-pay | Source: Home / Self Care | Attending: Orthopaedic Surgery

## 2020-04-14 ENCOUNTER — Encounter (HOSPITAL_COMMUNITY): Payer: Self-pay | Admitting: Orthopaedic Surgery

## 2020-04-14 ENCOUNTER — Observation Stay (HOSPITAL_COMMUNITY): Payer: PPO

## 2020-04-14 ENCOUNTER — Observation Stay (HOSPITAL_COMMUNITY)
Admission: RE | Admit: 2020-04-14 | Discharge: 2020-04-15 | Disposition: A | Discharge status: Home / Self Care | Payer: PPO | Attending: Orthopaedic Surgery | Admitting: Orthopaedic Surgery

## 2020-04-14 ENCOUNTER — Other Ambulatory Visit: Payer: Self-pay

## 2020-04-14 DIAGNOSIS — Z96642 Presence of left artificial hip joint: Secondary | ICD-10-CM | POA: Diagnosis not present

## 2020-04-14 DIAGNOSIS — Z91040 Latex allergy status: Secondary | ICD-10-CM | POA: Insufficient documentation

## 2020-04-14 DIAGNOSIS — Z87891 Personal history of nicotine dependence: Secondary | ICD-10-CM | POA: Insufficient documentation

## 2020-04-14 DIAGNOSIS — M25552 Pain in left hip: Secondary | ICD-10-CM | POA: Diagnosis present

## 2020-04-14 DIAGNOSIS — Z96652 Presence of left artificial knee joint: Secondary | ICD-10-CM | POA: Insufficient documentation

## 2020-04-14 DIAGNOSIS — M1612 Unilateral primary osteoarthritis, left hip: Principal | ICD-10-CM | POA: Insufficient documentation

## 2020-04-14 DIAGNOSIS — J45909 Unspecified asthma, uncomplicated: Secondary | ICD-10-CM | POA: Diagnosis not present

## 2020-04-14 DIAGNOSIS — Z86010 Personal history of colonic polyps: Secondary | ICD-10-CM | POA: Diagnosis not present

## 2020-04-14 DIAGNOSIS — K219 Gastro-esophageal reflux disease without esophagitis: Secondary | ICD-10-CM | POA: Diagnosis not present

## 2020-04-14 DIAGNOSIS — J302 Other seasonal allergic rhinitis: Secondary | ICD-10-CM | POA: Diagnosis not present

## 2020-04-14 DIAGNOSIS — Z7982 Long term (current) use of aspirin: Secondary | ICD-10-CM | POA: Insufficient documentation

## 2020-04-14 DIAGNOSIS — J4520 Mild intermittent asthma, uncomplicated: Secondary | ICD-10-CM | POA: Diagnosis not present

## 2020-04-14 DIAGNOSIS — Z471 Aftercare following joint replacement surgery: Secondary | ICD-10-CM | POA: Diagnosis not present

## 2020-04-14 DIAGNOSIS — Z419 Encounter for procedure for purposes other than remedying health state, unspecified: Secondary | ICD-10-CM

## 2020-04-14 HISTORY — PX: TOTAL HIP ARTHROPLASTY: SHX124

## 2020-04-14 LAB — TYPE AND SCREEN
ABO/RH(D): B NEG
Antibody Screen: NEGATIVE

## 2020-04-14 SURGERY — ARTHROPLASTY, HIP, TOTAL, ANTERIOR APPROACH
Anesthesia: Spinal | Site: Hip | Laterality: Left

## 2020-04-14 MED ORDER — PHENYLEPHRINE HCL (PRESSORS) 10 MG/ML IV SOLN
INTRAVENOUS | Status: AC
  Filled 2020-04-14: qty 1

## 2020-04-14 MED ORDER — MIDAZOLAM HCL 5 MG/5ML IJ SOLN
INTRAMUSCULAR | Status: DC | PRN
  Administered 2020-04-14: 2 mg via INTRAVENOUS

## 2020-04-14 MED ORDER — POVIDONE-IODINE 10 % EX SWAB
2.0000 "application " | Freq: Once | CUTANEOUS | Status: AC
  Administered 2020-04-14: 2 via TOPICAL

## 2020-04-14 MED ORDER — PROPOFOL 500 MG/50ML IV EMUL
INTRAVENOUS | Status: DC | PRN
  Administered 2020-04-14: 50 ug/kg/min via INTRAVENOUS

## 2020-04-14 MED ORDER — ONDANSETRON HCL 4 MG/2ML IJ SOLN: 4.0000 mg | Freq: Once | INTRAMUSCULAR | Status: DC | PRN

## 2020-04-14 MED ORDER — 0.9 % SODIUM CHLORIDE (POUR BTL) OPTIME
TOPICAL | Status: DC | PRN
  Administered 2020-04-14: 1000 mL

## 2020-04-14 MED ORDER — ONDANSETRON HCL 4 MG/2ML IJ SOLN
INTRAMUSCULAR | Status: DC | PRN
  Administered 2020-04-14: 4 mg via INTRAVENOUS

## 2020-04-14 MED ORDER — ONDANSETRON HCL 4 MG/2ML IJ SOLN
INTRAMUSCULAR | Status: AC
  Filled 2020-04-14: qty 2

## 2020-04-14 MED ORDER — CHLORHEXIDINE GLUCONATE 0.12 % MT SOLN
15.0000 mL | Freq: Once | OROMUCOSAL | Status: AC
  Administered 2020-04-14: 15 mL via OROMUCOSAL

## 2020-04-14 MED ORDER — OXYCODONE HCL 5 MG PO TABS
10.0000 mg | ORAL_TABLET | ORAL | Status: DC | PRN
  Administered 2020-04-14 – 2020-04-15 (×4): 10 mg via ORAL
  Filled 2020-04-14 (×2): qty 2

## 2020-04-14 MED ORDER — MIDAZOLAM HCL 2 MG/2ML IJ SOLN
INTRAMUSCULAR | Status: AC
  Filled 2020-04-14: qty 2

## 2020-04-14 MED ORDER — DOCUSATE SODIUM 100 MG PO CAPS
100.0000 mg | ORAL_CAPSULE | Freq: Two times a day (BID) | ORAL | Status: DC
  Administered 2020-04-14 – 2020-04-15 (×2): 100 mg via ORAL
  Filled 2020-04-14 (×2): qty 1

## 2020-04-14 MED ORDER — PROPOFOL 10 MG/ML IV BOLUS
INTRAVENOUS | Status: DC | PRN
  Administered 2020-04-14: 30 mg via INTRAVENOUS
  Administered 2020-04-14 (×4): 20 mg via INTRAVENOUS
  Administered 2020-04-14: 30 mg via INTRAVENOUS

## 2020-04-14 MED ORDER — DEXAMETHASONE SODIUM PHOSPHATE 10 MG/ML IJ SOLN
INTRAMUSCULAR | Status: DC | PRN
  Administered 2020-04-14: 8 mg via INTRAVENOUS

## 2020-04-14 MED ORDER — GABAPENTIN 100 MG PO CAPS
100.0000 mg | ORAL_CAPSULE | Freq: Three times a day (TID) | ORAL | Status: DC
  Administered 2020-04-14 – 2020-04-15 (×4): 100 mg via ORAL
  Filled 2020-04-14 (×4): qty 1

## 2020-04-14 MED ORDER — ORAL CARE MOUTH RINSE: 15.0000 mL | Freq: Once | OROMUCOSAL | Status: AC

## 2020-04-14 MED ORDER — TRANEXAMIC ACID-NACL 1000-0.7 MG/100ML-% IV SOLN
1000.0000 mg | INTRAVENOUS | Status: AC
  Administered 2020-04-14: 1000 mg via INTRAVENOUS
  Filled 2020-04-14: qty 100

## 2020-04-14 MED ORDER — OXYCODONE HCL 5 MG PO TABS
5.0000 mg | ORAL_TABLET | ORAL | Status: DC | PRN
  Administered 2020-04-15: 10 mg via ORAL
  Filled 2020-04-14 (×3): qty 2

## 2020-04-14 MED ORDER — METHOCARBAMOL 500 MG IVPB - SIMPLE MED
INTRAVENOUS | Status: AC
  Filled 2020-04-14: qty 50

## 2020-04-14 MED ORDER — ONDANSETRON HCL 4 MG/2ML IJ SOLN: 4.0000 mg | Freq: Four times a day (QID) | INTRAMUSCULAR | Status: DC | PRN

## 2020-04-14 MED ORDER — FLUTICASONE PROPIONATE 50 MCG/ACT NA SUSP
2.0000 | Freq: Every day | NASAL | Status: DC
  Administered 2020-04-14: 2 via NASAL
  Filled 2020-04-14: qty 16

## 2020-04-14 MED ORDER — POLYETHYLENE GLYCOL 3350 17 G PO PACK: 17.0000 g | PACK | Freq: Every day | ORAL | Status: DC | PRN

## 2020-04-14 MED ORDER — ALUM & MAG HYDROXIDE-SIMETH 200-200-20 MG/5ML PO SUSP: 30.0000 mL | ORAL | Status: DC | PRN

## 2020-04-14 MED ORDER — DIPHENHYDRAMINE HCL 12.5 MG/5ML PO ELIX: 12.5000 mg | ORAL_SOLUTION | ORAL | Status: DC | PRN

## 2020-04-14 MED ORDER — CLINDAMYCIN PHOSPHATE 600 MG/50ML IV SOLN
600.0000 mg | Freq: Four times a day (QID) | INTRAVENOUS | Status: AC
  Administered 2020-04-14 (×2): 600 mg via INTRAVENOUS
  Filled 2020-04-14 (×2): qty 50

## 2020-04-14 MED ORDER — METHOCARBAMOL 500 MG IVPB - SIMPLE MED
500.0000 mg | Freq: Four times a day (QID) | INTRAVENOUS | Status: DC | PRN
  Administered 2020-04-14: 500 mg via INTRAVENOUS
  Filled 2020-04-14: qty 50

## 2020-04-14 MED ORDER — PROPOFOL 10 MG/ML IV BOLUS
INTRAVENOUS | Status: AC
  Filled 2020-04-14: qty 20

## 2020-04-14 MED ORDER — PROPOFOL 500 MG/50ML IV EMUL
INTRAVENOUS | Status: AC
  Filled 2020-04-14: qty 50

## 2020-04-14 MED ORDER — ONDANSETRON HCL 4 MG PO TABS: 4.0000 mg | ORAL_TABLET | Freq: Four times a day (QID) | ORAL | Status: DC | PRN

## 2020-04-14 MED ORDER — ACETAMINOPHEN 500 MG PO TABS
1000.0000 mg | ORAL_TABLET | Freq: Once | ORAL | Status: AC
  Administered 2020-04-14: 1000 mg via ORAL
  Filled 2020-04-14: qty 2

## 2020-04-14 MED ORDER — STERILE WATER FOR IRRIGATION IR SOLN
Status: DC | PRN
  Administered 2020-04-14: 2000 mL

## 2020-04-14 MED ORDER — DEXAMETHASONE SODIUM PHOSPHATE 10 MG/ML IJ SOLN
INTRAMUSCULAR | Status: AC
  Filled 2020-04-14: qty 1

## 2020-04-14 MED ORDER — METHOCARBAMOL 500 MG PO TABS
500.0000 mg | ORAL_TABLET | Freq: Four times a day (QID) | ORAL | Status: DC | PRN
  Administered 2020-04-14 – 2020-04-15 (×2): 500 mg via ORAL
  Filled 2020-04-14 (×2): qty 1

## 2020-04-14 MED ORDER — FENTANYL CITRATE (PF) 100 MCG/2ML IJ SOLN
INTRAMUSCULAR | Status: AC
  Administered 2020-04-14: 25 ug via INTRAVENOUS
  Filled 2020-04-14: qty 2

## 2020-04-14 MED ORDER — FENTANYL CITRATE (PF) 100 MCG/2ML IJ SOLN
INTRAMUSCULAR | Status: DC | PRN
  Administered 2020-04-14: 50 ug via INTRAVENOUS

## 2020-04-14 MED ORDER — LACTATED RINGERS IV SOLN: INTRAVENOUS | Status: DC

## 2020-04-14 MED ORDER — PANTOPRAZOLE SODIUM 40 MG PO TBEC
40.0000 mg | DELAYED_RELEASE_TABLET | Freq: Every day | ORAL | Status: DC
  Administered 2020-04-15: 40 mg via ORAL
  Filled 2020-04-14: qty 1

## 2020-04-14 MED ORDER — SODIUM CHLORIDE 0.9 % IR SOLN
Status: DC | PRN
  Administered 2020-04-14: 1000 mL

## 2020-04-14 MED ORDER — METOCLOPRAMIDE HCL 5 MG PO TABS: 5.0000 mg | ORAL_TABLET | Freq: Three times a day (TID) | ORAL | Status: DC | PRN

## 2020-04-14 MED ORDER — ALBUTEROL SULFATE (2.5 MG/3ML) 0.083% IN NEBU: 2.5000 mg | INHALATION_SOLUTION | RESPIRATORY_TRACT | Status: DC | PRN

## 2020-04-14 MED ORDER — FENTANYL CITRATE (PF) 100 MCG/2ML IJ SOLN
INTRAMUSCULAR | Status: AC
  Filled 2020-04-14: qty 2

## 2020-04-14 MED ORDER — HYDROMORPHONE HCL 1 MG/ML IJ SOLN
0.5000 mg | INTRAMUSCULAR | Status: DC | PRN
  Administered 2020-04-15: 1 mg via INTRAVENOUS
  Filled 2020-04-14: qty 1

## 2020-04-14 MED ORDER — FENTANYL CITRATE (PF) 100 MCG/2ML IJ SOLN: 25.0000 ug | INTRAMUSCULAR | Status: DC | PRN

## 2020-04-14 MED ORDER — PHENOL 1.4 % MT LIQD: 1.0000 | OROMUCOSAL | Status: DC | PRN

## 2020-04-14 MED ORDER — MENTHOL 3 MG MT LOZG: 1.0000 | LOZENGE | OROMUCOSAL | Status: DC | PRN

## 2020-04-14 MED ORDER — METOCLOPRAMIDE HCL 5 MG/ML IJ SOLN: 5.0000 mg | Freq: Three times a day (TID) | INTRAMUSCULAR | Status: DC | PRN

## 2020-04-14 MED ORDER — CLINDAMYCIN PHOSPHATE 900 MG/50ML IV SOLN
900.0000 mg | INTRAVENOUS | Status: AC
  Administered 2020-04-14: 900 mg via INTRAVENOUS
  Filled 2020-04-14: qty 50

## 2020-04-14 MED ORDER — ASPIRIN 81 MG PO CHEW
81.0000 mg | CHEWABLE_TABLET | Freq: Two times a day (BID) | ORAL | Status: DC
  Administered 2020-04-14 – 2020-04-15 (×2): 81 mg via ORAL
  Filled 2020-04-14 (×2): qty 1

## 2020-04-14 MED ORDER — ALBUTEROL SULFATE HFA 108 (90 BASE) MCG/ACT IN AERS: 2.0000 | INHALATION_SPRAY | RESPIRATORY_TRACT | Status: DC | PRN

## 2020-04-14 MED ORDER — SODIUM CHLORIDE 0.9 % IV SOLN: INTRAVENOUS | Status: DC

## 2020-04-14 MED ORDER — ACETAMINOPHEN 325 MG PO TABS: 325.0000 mg | ORAL_TABLET | Freq: Four times a day (QID) | ORAL | Status: DC | PRN

## 2020-04-14 MED ORDER — BUPIVACAINE IN DEXTROSE 0.75-8.25 % IT SOLN
INTRATHECAL | Status: DC | PRN
  Administered 2020-04-14: 1.8 mL via INTRATHECAL

## 2020-04-14 SURGICAL SUPPLY — 37 items
BAG ZIPLOCK 12X15 (MISCELLANEOUS) IMPLANT
BENZOIN TINCTURE PRP APPL 2/3 (GAUZE/BANDAGES/DRESSINGS) IMPLANT
BLADE SAW SGTL 18X1.27X75 (BLADE) ×2 IMPLANT
COVER PERINEAL POST (MISCELLANEOUS) ×2 IMPLANT
COVER SURGICAL LIGHT HANDLE (MISCELLANEOUS) ×2 IMPLANT
COVER WAND RF STERILE (DRAPES) ×2 IMPLANT
CUP SECTOR GRIPTON 50MM (Cup) ×2 IMPLANT
DRAPE STERI IOBAN 125X83 (DRAPES) ×2 IMPLANT
DRAPE U-SHAPE 47X51 STRL (DRAPES) ×4 IMPLANT
DRSG AQUACEL AG ADV 3.5X10 (GAUZE/BANDAGES/DRESSINGS) ×2 IMPLANT
DURAPREP 26ML APPLICATOR (WOUND CARE) ×2 IMPLANT
ELECT REM PT RETURN 15FT ADLT (MISCELLANEOUS) ×2 IMPLANT
GAUZE XEROFORM 1X8 LF (GAUZE/BANDAGES/DRESSINGS) ×2 IMPLANT
GLOVE BIO SURGEON STRL SZ7.5 (GLOVE) ×2 IMPLANT
GLOVE BIOGEL PI IND STRL 8 (GLOVE) ×2 IMPLANT
GLOVE BIOGEL PI INDICATOR 8 (GLOVE) ×2
GLOVE ECLIPSE 8.0 STRL XLNG CF (GLOVE) ×2 IMPLANT
GOWN STRL REUS W/TWL XL LVL3 (GOWN DISPOSABLE) ×4 IMPLANT
HANDPIECE INTERPULSE COAX TIP (DISPOSABLE) ×2
HEAD FEMORAL 32 CERAMIC (Hips) ×2 IMPLANT
HOLDER FOLEY CATH W/STRAP (MISCELLANEOUS) ×2 IMPLANT
KIT TURNOVER KIT A (KITS) IMPLANT
LINER ACETABULAR 32X50 (Liner) ×2 IMPLANT
PACK ANTERIOR HIP CUSTOM (KITS) ×2 IMPLANT
PENCIL SMOKE EVACUATOR (MISCELLANEOUS) IMPLANT
SET HNDPC FAN SPRY TIP SCT (DISPOSABLE) ×1 IMPLANT
STAPLER VISISTAT 35W (STAPLE) IMPLANT
STEM CORAIL KA10 (Stem) ×2 IMPLANT
STRIP CLOSURE SKIN 1/2X4 (GAUZE/BANDAGES/DRESSINGS) IMPLANT
SUT ETHIBOND NAB CT1 #1 30IN (SUTURE) ×2 IMPLANT
SUT ETHILON 2 0 PS N (SUTURE) ×4 IMPLANT
SUT MNCRL AB 4-0 PS2 18 (SUTURE) IMPLANT
SUT VIC AB 0 CT1 36 (SUTURE) ×2 IMPLANT
SUT VIC AB 1 CT1 36 (SUTURE) ×2 IMPLANT
SUT VIC AB 2-0 CT1 27 (SUTURE) ×4
SUT VIC AB 2-0 CT1 TAPERPNT 27 (SUTURE) ×2 IMPLANT
TRAY FOLEY MTR SLVR 16FR STAT (SET/KITS/TRAYS/PACK) IMPLANT

## 2020-04-14 NOTE — TOC Transition Note (Signed)
Transition of Care [facility]) - CM/SW Discharge Note   Patient Details  Name: Sharon [name] MRN: [id number] Date of Birth: 1956/03/14  Transition of Care [facility]) CM/SW Contact:  Lennart Pall, LCSW Phone Number: 04/14/2020, 3:31 PM   Clinical Narrative:    Met with pt and daughter to review dc needs.  Pt reports she has all needed DME.  Orders are in for HHPT and pt agreeable.  Referral placed with [facility].  Pt eager for dc tomorrow.  No further TOC needs.   Final next level of care: [place] Barriers to Discharge: No Barriers Identified   Patient Goals and CMS Choice Patient states their goals for this hospitalization and ongoing recovery are:: go home tomorrow CMS Medicare.gov Compare Post Acute Care list provided to:: Patient Choice offered to / list presented to : Patient  Discharge Placement                       Discharge Plan and Services                DME Arranged: N/A DME Agency: NA       HH Arranged: PT HH Agency: [place] Date [place]: 04/14/20 Time HH Agency Contacted: 1400 Representative spoke with at [place]: Cindie  Social Determinants of Health ([place]) Interventions     Readmission Risk Interventions No flowsheet data found.

## 2020-04-14 NOTE — Evaluation (Signed)
Physical Therapy Evaluation Patient Details Name: Sharon [name] MRN: [id number] DOB: Oct 15, 1955 Today's Date: 04/14/2020   History of Present Illness  Patient is 64 y.o. female s/p Lt THA anterior approach on 04/14/20 with PMH significant for OA, migraine, GERD, melanoma, Lt TKA and revision, Rt TKA.    Clinical Impression  Sharon [name] is a 64 y.o. female POD 0 s/p Lt THA. Patient reports independence with mobility at baseline. Patient is now limited by functional impairments (see PT problem list below) and requires min assist for transfers and gait with RW. Patient was able to ambulate ~40 feet with RW and min assist. Patient instructed in exercise to facilitate ROM and circulation. Patient will benefit from continued skilled PT interventions to address impairments and progress towards PLOF. Acute PT will follow to progress mobility and stair training in preparation for safe discharge home.     Follow Up Recommendations Follow surgeon's recommendation for DC plan and follow-up therapies;Home health PT    Equipment Recommendations  None recommended by PT    Recommendations for Other Services       Precautions / Restrictions Precautions Precautions: Fall Restrictions Weight Bearing Restrictions: No Other Position/Activity Restrictions: WBAT      Mobility  Bed Mobility Overal bed mobility: Needs Assistance Bed Mobility: Supine to Sit     Supine to sit: Min assist;HOB elevated     General bed mobility comments: cues to use bed rail and assist to bring Lt LE off EOB.    Transfers Overall transfer level: Needs assistance Equipment used: Rolling walker (2 wheeled) Transfers: Sit to/from Stand Sit to Stand: Min assist         General transfer comment: cues for technique with RW, assist for power up and to steady during rise.  Ambulation/Gait Ambulation/Gait assistance: Min assist Gait Distance (Feet): 40 Feet Assistive device: Rolling walker (2 wheeled) Gait  Pattern/deviations: Step-to pattern;Decreased stride length;Decreased weight shift to left Gait velocity: decr   General Gait Details: cues for step pattern and proximity to RW. Assist to stabilize RW and cues for even weight distribution on walker.   Stairs            Wheelchair Mobility    Modified Rankin (Stroke Patients Only)       Balance Overall balance assessment: Needs assistance Sitting-balance support: Feet supported Sitting balance-Leahy Scale: Good     Standing balance support: During functional activity;Bilateral upper extremity supported Standing balance-Leahy Scale: Fair                               Pertinent Vitals/Pain Pain Assessment: Faces Faces Pain Scale: Hurts little more Pain Location: Lt hip Pain Descriptors / Indicators: Aching;Discomfort;Sore Pain Intervention(s): Limited activity within patient's tolerance;Monitored during session;Repositioned;Ice applied    Home Living Family/patient expects to be discharged to:: Private residence Living Arrangements: Children;Other relatives Available Help at Discharge: Family Type of Home: House Home Access: Stairs to enter Entrance Stairs-Rails: None Entrance Stairs-Number of Steps: 3 Home Layout: One level;Laundry or work area in [place]: Shower seat;Grab bars - tub/shower;Walker - 2 wheels;Walker - 4 wheels;Cane - single point;Bedside commode;Toilet riser      Prior Function Level of Independence: Independent with assistive device(s)         Comments: uses Rollator for gait, pt's daughter does grocery shopping and driving.     Hand Dominance   Dominant Hand: Right    Extremity/Trunk Assessment   Upper  Extremity Assessment Upper Extremity Assessment: Overall WFL for tasks assessed    Lower Extremity Assessment Lower Extremity Assessment: Generalized weakness    Cervical / Trunk Assessment Cervical / Trunk Assessment: Other exceptions Cervical / Trunk  Exceptions: habitus  Communication   Communication: No difficulties  Cognition Arousal/Alertness: Awake/alert Behavior During Therapy: WFL for tasks assessed/performed Overall Cognitive Status: Within Functional Limits for tasks assessed                                        General Comments      Exercises Total Joint Exercises Ankle Circles/Pumps: AROM;Both;20 reps;Seated Heel Slides: AROM;Left;10 reps;Seated   Assessment/Plan    PT Assessment Patient needs continued PT services  PT Problem List Decreased strength;Decreased range of motion;Decreased activity tolerance;Decreased balance;Decreased mobility;Decreased knowledge of use of DME;Decreased knowledge of precautions;Pain       PT Treatment Interventions DME instruction;Gait training;Stair training;Functional mobility training;Therapeutic activities;Therapeutic exercise;Balance training;Patient/family education    PT Goals (Current goals can be found in the Care Plan section)  Acute Rehab PT Goals Patient Stated Goal: get back more independence PT Goal Formulation: With patient Time For Goal Achievement: 04/21/20 Potential to Achieve Goals: Good    Frequency 7X/week   Barriers to discharge        Co-evaluation               AM-PAC PT "6 Clicks" Mobility  Outcome Measure Help needed turning from your back to your side while in a flat bed without using bedrails?: None Help needed moving from lying on your back to sitting on the side of a flat bed without using bedrails?: A Little Help needed moving to and from a bed to a chair (including a wheelchair)?: A Little Help needed standing up from a chair using your arms (e.g., wheelchair or bedside chair)?: A Little Help needed to walk in hospital room?: A Little Help needed climbing 3-5 steps with a railing? : A Little 6 Click Score: 19    End of Session Equipment Utilized During Treatment: Gait belt Activity Tolerance: Patient tolerated  treatment well Patient left: in chair;with call bell/phone within reach;with chair alarm set;with family/visitor present Nurse Communication: Mobility status PT Visit Diagnosis: Muscle weakness (generalized) (M62.81);Difficulty in walking, not elsewhere classified (R26.2)    Time: 9476-5465 PT Time Calculation (min) (ACUTE ONLY): 34 min   Charges:   PT Evaluation $PT Eval Low Complexity: 1 Low PT Treatments $Gait Training: 8-22 mins       Verner Mould, DPT Acute Rehabilitation Services  Office [phone] Pager [phone]  04/14/2020 2:23 PM

## 2020-04-14 NOTE — Anesthesia Procedure Notes (Addendum)
Spinal  Patient location during procedure: OR Start time: 04/14/2020 7:22 AM End time: 04/14/2020 7:29 AM Staffing Performed: resident/CRNA  Anesthesiologist: Murvin Natal, MD Resident/CRNA: Montel Clock, CRNA Preanesthetic Checklist Completed: patient identified, IV checked, risks and benefits discussed, surgical consent, monitors and equipment checked, pre-op evaluation and timeout performed Spinal Block Patient position: sitting Prep: DuraPrep Patient monitoring: heart rate, cardiac monitor, continuous pulse ox and blood pressure Approach: midline Location: L3-4 Injection technique: single-shot Needle Needle type: Pencan  Needle gauge: 24 G Needle length: 10 cm Needle insertion depth: 9 cm Assessment Sensory level: T6

## 2020-04-14 NOTE — Brief Op Note (Signed)
04/14/2020  8:51 AM  PATIENT:  [name]  63 y.o. female  PRE-OPERATIVE DIAGNOSIS:  osteoarthritis left hip  POST-OPERATIVE DIAGNOSIS:  osteoarthritis left hip  PROCEDURE:  Procedure(s): LEFT TOTAL HIP ARTHROPLASTY ANTERIOR APPROACH (Left)  SURGEON:  Surgeon(s) and Role:    Mcarthur Rossetti, MD - Primary  PHYSICIAN ASSISTANT:  Benita Stabile, PA-C   ANESTHESIA:   spinal  EBL:  150 mL   COUNTS:  YES  DICTATION: .Other Dictation: Dictation Number [id number]  PLAN OF CARE: Admit for overnight observation  PATIENT DISPOSITION:  PACU - hemodynamically stable.   Delay start of Pharmacological VTE agent (>24hrs) due to surgical blood loss or risk of bleeding: no

## 2020-04-14 NOTE — Transfer of Care (Signed)
Immediate Anesthesia Transfer of Care Note  Patient: Sharon [name]  Procedure(s) Performed: LEFT TOTAL HIP ARTHROPLASTY ANTERIOR APPROACH (Left Hip)  Patient Location: PACU  Anesthesia Type:Spinal  Level of Consciousness: drowsy and patient cooperative  Airway & Oxygen Therapy: Patient Spontanous Breathing and Patient connected to face mask oxygen  Post-op Assessment: Report given to RN and Post -op Vital signs reviewed and stable  Post vital signs: Reviewed and stable  Last Vitals:  Vitals Value Taken Time  BP 116/93 04/14/20 0919  Temp    Pulse 67 04/14/20 0923  Resp 18 04/14/20 0923  SpO2 100 % 04/14/20 0923  Vitals shown include unvalidated device data.  Last Pain:  Vitals:   04/14/20 0618  TempSrc:   PainSc: 7          Complications: No complications documented.

## 2020-04-14 NOTE — Op Note (Signed)
NAME: Sharon [name], RENGEL MEDICAL RECORD NF:[id number] ACCOUNT [id number] DATE OF BIRTH:01-26-1956 FACILITY: [facility] LOCATION: [facility] PHYSICIAN:Aika Brzoska Kerry Fort, MD  OPERATIVE REPORT  DATE OF PROCEDURE:  04/14/2020  PREOPERATIVE DIAGNOSIS:  Primary osteoarthritis and degenerative joint disease, left hip.  POSTOPERATIVE DIAGNOSIS:  Primary osteoarthritis and degenerative joint disease, left hip.  PROCEDURE:  Left total hip arthroplasty through direct anterior approach.  IMPLANTS:  DePuy Sector Gription acetabular component size 50, size 32+0 neutral polyethylene liner, size 10 Corail femoral component with standard offset, size 32+1 ceramic hip ball.  SURGEON:  Vinayak Bobier.  Ninfa Linden, MD  ASSISTANT:  Erskine Emery, PA-C  ANESTHESIA:  Spinal.  ANTIBIOTICS:  900 mg IV clindamycin.  ESTIMATED BLOOD LOSS:  150 mL.  COMPLICATIONS:  None.  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  This is well documented on x-rays and clinical exam.  She does have end-stage arthritis.  Her BMI is 44, but she has worked on weight  loss  quite a bit.  She was able actually to successfully position her insurance company, and I was able to write letters on her behalf feeling like we could perform a total hip arthroplasty, and this will help her continue on her weight loss journey.   She has had bilateral knee replacements done elsewhere.  At this point, she has been cleared for surgery and approved by insurance.  I felt comfortable performing the surgery.  She does understand that she does have a heightened risk of acute blood loss  anemia, nerve or vessel injury, fracture, infection, dislocation, DVT, implant failure, and skin and soft tissue issues given her morbid obesity.  She understands our goals are to decrease pain, improve mobility, and overall improve quality of life.  DESCRIPTION OF PROCEDURE:  After informed  consent was obtained and appropriate left hip was marked, she was brought to the operating room and sat up on a stretcher where spinal anesthesia was obtained.  She was laid in supine position on a stretcher.  I  placed traction boots on both her feet.  Next, I placed her supine on the Hana fracture table, the perineal post in place, and both legs in in-line skeletal traction device and no traction applied.  Her left operative hip was prepped and draped with  DuraPrep and sterile drapes.  Timeout was called, and she was identified as correct patient, correct left hip.  I then made an incision just inferior and posterior to the anterior superior iliac spine and carried this obliquely down the leg.  We  dissected down tensor fascia lata muscle.  Tensor fascia was then divided longitudinally to proceed with direct anterior approach to the hip.  We identified and cauterized circumflex vessels and identified the hip capsule, opened the hip capsule in an  L-type format finding a moderate joint effusion and significant periarticular osteophytes around the lateral femoral head and neck.  I placed Cobra retractors within the joint capsule and made our femoral neck cut with an oscillating saw just proximal to  the lesser trochanter and completed this with an osteotome.  I placed a corkscrew guide in the femoral head and removed the femoral heads in its entirety and found a wide area devoid of cartilage.  I removed periarticular osteophytes around the  acetabulum as well as remnants of the acetabular labrum and other debris.  I placed a bent Hohmann over the medial acetabular rim and then began reaming under direct visualization from a  size 44 reamer in stepwise increments up to a size 49 with all  reamers placed under direct visualization, the last reamer placed under direct fluoroscopy, so we could obtain our depth of reaming, our inclination and anteversion.  I then placed the real DePuy Sector Gription acetabular  component size 50 and a 32+0  neutral polyethylene liner for that size 50 acetabular component.  Attention was then turned to the femur.  With the leg externally rotated to 120 degrees, extended and adducted, I was able to place a Mueller retractor medially and Hohmann retractor  above the greater trochanter.  We released lateral joint capsule and used a box-cutting osteotome to enter the femoral canal and a rongeur to lateralize.  We then began broaching using the Corail broaching system from a size 8 to a size 10.  The size 10  fit really hard and it was certainly going to be a little bit more prominent as the lower neck cut.  I trialed a standard offset femoral neck and a 32+1 hip ball and reduced this in the acetabulum, and it was stable and we increased her leg length and  offset, but I felt like we needed stability, which she externally rotates her legs.  We dislocated the hip and removed the trial components.  I placed the real Corail femoral component size 10 with standard offset and the real 32+1 ceramic hip ball and  again reduced this in the acetabulum and it was stable on our exam clinically and looking at it radiographically.  I do not think we have made her longer, but we need stability given her obesity and her knee replacements as well.  We then irrigated the  soft tissue with normal saline solution using pulsatile lavage.  We closed the joint capsule with interrupted #1 Ethibond suture, followed by closing the tensor fascia with #1 Vicryl.  0 Vicryl was used to close deep tissue and 2-0 Vicryl was used to  close subcutaneous tissue.  The skin was closed with interrupted 2-0 nylon suture.  Xeroform and Aquacel dressing was applied.  She was taken off the Hana table and taken to recovery room in stable condition with all final counts being correct.  No  complications noted.  Note Erskine Emery, PA-C, assisted during the entire case and assistance was crucial for facilitating all aspects of  this case.  IN/NUANCE  D:04/14/2020 T:04/14/2020 JOB:[id number]

## 2020-04-14 NOTE — H&P (Signed)
TOTAL HIP ADMISSION H&P  Patient is admitted for left total hip arthroplasty.  Subjective:  Chief Complaint: left hip pain  HPI: Sharon [name], 64 y.o. female, has a history of pain and functional disability in the left hip(s) due to arthritis and patient has failed non-surgical conservative treatments for greater than 12 weeks to include NSAID's and/or analgesics, corticosteriod injections, flexibility and strengthening excercises, supervised PT with diminished ADL's post treatment, use of assistive devices, weight reduction as appropriate and activity modification.  Onset of symptoms was gradual starting 3 years ago with gradually worsening course since that time.The patient noted no past surgery on the left hip(s).  Patient currently rates pain in the left hip at 10 out of 10 with activity. Patient has night pain, worsening of pain with activity and weight bearing, trendelenberg gait, pain that interfers with activities of daily living and pain with passive range of motion. Patient has evidence of subchondral cysts, subchondral sclerosis, periarticular osteophytes and joint space narrowing by imaging studies. This condition presents safety issues increasing the risk of falls.  There is no current active infection.  Patient Active Problem List   Diagnosis Date Noted  . Unilateral primary osteoarthritis, left hip 02/23/2020  . Seasonal and perennial allergic rhinitis 03/06/2017  . Moderate persistent asthma, uncomplicated 84/13/2440  . Chronic nonseasonal allergic rhinitis due to fungal spores 12/02/2016  . Chronic urticaria 12/02/2016  . Anaphylactic shock due to adverse food reaction 12/02/2016  . History of colonic polyps   . Diverticulosis of colon without hemorrhage   . Asthma, mild intermittent 08/30/2015  . Encounter for screening colonoscopy 08/25/2015  . Wheezing 08/08/2015  . Gastroesophageal reflux disease 08/08/2015  . Obesity 07/14/2013  . Status post right knee replacement  06/17/2011 06/17/2013  . Status post left knee replacement 02/28/2007 06/17/2013  . DDD (degenerative disc disease), lumbosacral 10/06/2012  . Hip pain 10/06/2012  . Radicular leg pain 10/06/2012  . Bursitis of hip, right 06/25/2012  . Trochanteric bursitis of both hips 09/11/2011  . Left knee pain 03/20/2011  . OA (osteoarthritis) of knee 12/12/2010  . Mononeuritis of leg 10/24/2010  . Chronic pain 10/24/2010  . OTHER POSTSURGICAL STATUS OTHER 07/24/2010  . STRESS FRACTURE, TIBIA 04/11/2010  . TENOSYNOVITIS OF FOOT AND ANKLE 02/15/2010  . LOOSE BODY-KNEE 01/10/2010  . DERANGEMENT MENISCUS 01/01/2010  . [place] HEAD 01/01/2010  . SHOULDER PAIN 04/19/2009  . IMPINGEMENT SYNDROME 04/19/2009  . HERNIATED LUMBOSACRAL DISC 09/01/2007  . UNSPECIFIED NEURALGIA NEURITIS AND RADICULITIS 07/09/2007  . TOTAL KNEE FOLLOW-UP 06/25/2007  . Osteoarthrosis, unspecified whether generalized or localized, lower leg 03/09/2007  . KNEE PAIN 02/02/2007  . TEAR MEDIAL MENISCUS 02/02/2007   Past Medical History:  Diagnosis Date  . Biliary colic   . Cancer ([place])    melanoma: lt. inner thight  . Carpal tunnel syndrome    bilateral  . Chronic allergic rhinitis   . Chronic urticaria   . Complication of anesthesia    confusion for a couple of days after waking after having multiple knee surgery's, does well with spinal  . Degenerative disk disease   . Diverticulosis of colon   . Fatty liver   . Fibrocystic breast disease 1996  . GERD (gastroesophageal reflux disease)   . History of colon polyps    HYPERPLASTIC 09-14-2015  . History of kidney stones    passed on own  . History of melanoma excision    left thigh ,inside 2009--- per pt also has had severeal excision of office  for almost melanoma  . Migraine    since age 66 not as frequent now  . Moderate persistent asthma, uncomplicated    followed by dr gallagher (cone allergy and asthma)  . OA (osteoarthritis)    all joints  . SUI  (stress urinary incontinence, female)   . Wears glasses     Past Surgical History:  Procedure Laterality Date  . APPENDECTOMY  1973  . BREAST EXCISIONAL BIOPSY Right 1994;  10-01-2009   2011 per path mild ductal hyperplasia/ fibrocystic breast  . CHOLECYSTECTOMY N/A 01/30/2017   Procedure: LAPAROSCOPIC CHOLECYSTECTOMY;  Surgeon: Kieth Brightly Arta Bruce, MD;  Location: [facility];  Service: General;  Laterality: N/A;  . COLONOSCOPY N/A 09/14/2015   Procedure: COLONOSCOPY;  Surgeon: Daneil Dolin, MD;  Location: [facility];  Service: Endoscopy;  Laterality: N/A;  930 - moved to 9:15 - office to notify  . ESOPHAGOGASTRODUODENOSCOPY (EGD) WITH PROPOFOL N/A 11/29/2016   Procedure: ESOPHAGOGASTRODUODENOSCOPY (EGD) WITH PROPOFOL;  Surgeon: Doran Stabler, MD;  Location: [facility];  Service: Gastroenterology;  Laterality: N/A;  . KNEE ARTHROSCOPY W/ MENISCECTOMY Bilateral left 12-19-2006;  right 01-26-2010  . LAPAROSCOPIC GASTRIC SLEEVE RESECTION N/A 08/16/2019   Procedure: LAPAROSCOPIC GASTRIC SLEEVE RESECTION, Upper Endo, ERAS Pathway;  Surgeon: Kinsinger, Arta Bruce, MD;  Location: [facility];  Service: General;  Laterality: N/A;  . TONSILLECTOMY  1959  . TOTAL KNEE ARTHROPLASTY Left 03/10/2007  . TOTAL KNEE ARTHROPLASTY  06/17/2011   Procedure: TOTAL KNEE ARTHROPLASTY;  Surgeon: Arther Abbott, MD;  Location: [facility];  Service: Orthopedics;  Laterality: Right;  DePuy  . TOTAL KNEE REVISION Left 07/08/2010  (spinal anesthesia)  . TUBAL LIGATION Bilateral YRS AGO  . VAGINAL HYSTERECTOMY  1988    Current Facility-Administered Medications  Medication Dose Route Frequency Provider Last Rate Last Admin  . clindamycin (CLEOCIN) IVPB 900 mg  900 mg Intravenous On Call to OR Pete Pelt, PA-C      . lactated ringers infusion   Intravenous Continuous Merlinda Frederick, MD 10 mL/hr at 04/14/20 9563 Continued from Pre-op at 04/14/20 0638  . tranexamic acid (CYKLOKAPRON) IVPB 1,000 mg   1,000 mg Intravenous To OR Pete Pelt, PA-C       Allergies  Allergen Reactions  . 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 Adhesive [Tape] Hives and Dermatitis    Can tolerate tegaderm  . Augmentin [Amoxicillin-Pot Clavulanate] Diarrhea and Nausea Only  . Latex Hives  . Nickel Hives  . Neomycin-Bacitracin Zn-Polymyx Dermatitis    NEOSPORIN    Social History   Tobacco Use  . Smoking status: Former Smoker    Packs/day: 1.00    Years: 25.00    Pack years: 25.00    Types: Cigarettes    Start date: 08/02/1972    Quit date: 05/27/2006    Years since quitting: 13.8  . Smokeless tobacco: Never Used  Substance Use Topics  . Alcohol use: Yes    Alcohol/week: 0.0 standard drinks    Comment: occas.    Family History  Problem Relation Age of Onset  . Hypertension Father   . Lung cancer Father 17       lung cancer  . Lung disease Other        family history   . Allergic rhinitis Daughter   . Food Allergy Grandchild   . Anesthesia problems Neg Hx   . Hypotension Neg Hx   . Malignant hyperthermia  Neg Hx   . Pseudochol deficiency Neg Hx   . Colon cancer Neg Hx   . Angioedema Neg Hx   . Asthma Neg Hx   . Eczema Neg Hx   . Immunodeficiency Neg Hx   . Urticaria Neg Hx      Review of Systems  Musculoskeletal: Positive for gait problem.  All other systems reviewed and are negative.   Objective:  Physical Exam Vitals reviewed.  Constitutional:      Appearance: Normal appearance.  HENT:     Head: Normocephalic and atraumatic.  Eyes:     Extraocular Movements: Extraocular movements intact.  Musculoskeletal:     Cervical back: Normal range of motion.     Left hip: Tenderness and bony tenderness present. Decreased range of motion. Decreased strength.  Neurological:     Mental Status: She is alert and oriented to person, place, and time.  Psychiatric:        Behavior: Behavior normal.     Vital signs in last 24 hours: Temp:   [98.4 F (36.9 C)] 98.4 F (36.9 C) (11/19 0610) Pulse Rate:  [65] 65 (11/19 0610) Resp:  [18] 18 (11/19 0610) BP: (142)/(73) 142/73 (11/19 0610) SpO2:  [97 %] 97 % (11/19 0610) Weight:  [102.5 kg] 102.5 kg (11/19 0618)  Labs:   Estimated body mass index is 44.14 kg/m as calculated from the following:   Height as of 04/11/20: 5' (1.524 m).   Weight as of this encounter: 102.5 kg.   Imaging Review Plain radiographs demonstrate severe degenerative joint disease of the left hip(s). The bone quality appears to be good for age and reported activity level.      Assessment/Plan:  End stage arthritis, left hip(s)  The patient history, physical examination, clinical judgement of the provider and imaging studies are consistent with end stage degenerative joint disease of the left hip(s) and total hip arthroplasty is deemed medically necessary. The treatment options including medical management, injection therapy, arthroscopy and arthroplasty were discussed at length. The risks and benefits of total hip arthroplasty were presented and reviewed. The risks due to aseptic loosening, infection, stiffness, dislocation/subluxation,  thromboembolic complications and other imponderables were discussed.  The patient acknowledged the explanation, agreed to proceed with the plan and consent was signed. Patient is being admitted for inpatient treatment for surgery, pain control, PT, OT, prophylactic antibiotics, VTE prophylaxis, progressive ambulation and ADL's and discharge planning.The patient is planning to be discharged home with home health services

## 2020-04-15 DIAGNOSIS — M1612 Unilateral primary osteoarthritis, left hip: Secondary | ICD-10-CM | POA: Diagnosis not present

## 2020-04-15 LAB — CBC
HCT: 36.3 % (ref 36.0–46.0)
Hemoglobin: 12 g/dL (ref 12.0–15.0)
MCH: 33.1 pg (ref 26.0–34.0)
MCHC: 33.1 g/dL (ref 30.0–36.0)
MCV: 100.3 fL — ABNORMAL HIGH (ref 80.0–100.0)
Platelets: 165 10*3/uL (ref 150–400)
RBC: 3.62 MIL/uL — ABNORMAL LOW (ref 3.87–5.11)
RDW: 12.3 % (ref 11.5–15.5)
WBC: 12.1 10*3/uL — ABNORMAL HIGH (ref 4.0–10.5)
nRBC: 0 % (ref 0.0–0.2)

## 2020-04-15 LAB — BASIC METABOLIC PANEL
Anion gap: 8 (ref 5–15)
BUN: 12 mg/dL (ref 8–23)
CO2: 25 mmol/L (ref 22–32)
Calcium: 8.3 mg/dL — ABNORMAL LOW (ref 8.9–10.3)
Chloride: 107 mmol/L (ref 98–111)
Creatinine, Ser: 0.57 mg/dL (ref 0.44–1.00)
GFR, Estimated: >60 mL/min (ref >60)
Glucose, Bld: 143 mg/dL — ABNORMAL HIGH (ref 70–99)
Potassium: 3.5 mmol/L (ref 3.5–5.1)
Sodium: 140 mmol/L (ref 135–145)

## 2020-04-15 MED ORDER — ASPIRIN 81 MG PO CHEW
81.0000 mg | CHEWABLE_TABLET | Freq: Two times a day (BID) | ORAL | 0 refills | Status: DC
Start: 2020-04-15 — End: 2020-07-06

## 2020-04-15 MED ORDER — OXYCODONE HCL 5 MG PO TABS
5.0000 mg | ORAL_TABLET | ORAL | 0 refills | Status: DC | PRN
Start: 2020-04-15 — End: 2020-07-06

## 2020-04-15 MED ORDER — METHOCARBAMOL 500 MG PO TABS: 500.0000 mg | ORAL_TABLET | Freq: Four times a day (QID) | ORAL | 1 refills | Status: DC | PRN

## 2020-04-15 NOTE — Progress Notes (Signed)
Subjective: 1 Day Post-Op Procedure(s) (LRB): LEFT TOTAL HIP ARTHROPLASTY ANTERIOR APPROACH (Left) Patient reports pain as moderate.    Objective: Vital signs in last 24 hours: Temp:  [98.4 F (36.9 C)-99.8 F (37.7 C)] 98.4 F (36.9 C) (11/20 0942) Pulse Rate:  [58-85] 76 (11/20 0942) Resp:  [15-20] 17 (11/20 0942) BP: (106-155)/(52-92) 106/52 (11/20 0942) SpO2:  [96 %-99 %] 99 % (11/20 0942)  Intake/Output from previous day: 11/19 0701 - 11/20 0700 In: 3086.8 [I.V.:2986.8; IV Piggyback:100] Out: 2650 [Urine:2500; Blood:150] Intake/Output this shift: Total I/O In: 240 [P.O.:240] Out: 200 [Urine:200]  Recent Labs    04/15/20 0402  HGB 12.0   Recent Labs    04/15/20 0402  WBC 12.1*  RBC 3.62*  HCT 36.3  PLT 165   Recent Labs    04/15/20 0402  NA 140  K 3.5  CL 107  CO2 25  BUN 12  CREATININE 0.57  GLUCOSE 143*  CALCIUM 8.3*   No results for input(s): LABPT, INR in the last 72 hours.  Sensation intact distally Intact pulses distally Dorsiflexion/Plantar flexion intact Incision: scant drainage No cellulitis present Compartment soft   Assessment/Plan: 1 Day Post-Op Procedure(s) (LRB): LEFT TOTAL HIP ARTHROPLASTY ANTERIOR APPROACH (Left) Up with therapy Discharge home with home health    Patient's anticipated LOS is less than 2 midnights, meeting these requirements: - Younger than 59 - Lives within 1 hour of care - Has a competent adult at home to recover with post-op recover - NO history of  - Chronic pain requiring opiods  - Diabetes  - Coronary Artery Disease  - Heart failure  - Heart attack  - Stroke  - DVT/VTE  - Cardiac arrhythmia  - Respiratory Failure/COPD  - Renal failure  - Anemia  - Advanced Liver disease       Mcarthur Rossetti 04/15/2020, 11:15 AM

## 2020-04-15 NOTE — Progress Notes (Signed)
Physical Therapy Treatment Patient Details Name: Sharon [name] MRN: [id number] DOB: [birth year] Today's Date: 04/15/2020    History of Present Illness Patient is 64 y.o. female s/p Lt THA anterior approach on 04/14/20 with PMH significant for OA, migraine, GERD, melanoma, Lt TKA and revision, Rt TKA.    PT Comments    Pt complaining of significant anterior hip pain this am, but states it is improving with pain medications and mobility. Pt ambulated hallway distance with close guard for safety and RW. PT plans to see pt for second session to complete stair and exercise training education, will continue to follow acutely.    Follow Up Recommendations  Follow surgeon's recommendation for DC plan and follow-up therapies;Home health PT     Equipment Recommendations  None recommended by PT    Recommendations for Other Services       Precautions / Restrictions Precautions Precautions: Fall Restrictions Weight Bearing Restrictions: No Other Position/Activity Restrictions: WBAT    Mobility  Bed Mobility               General bed mobility comments: up with NT  Transfers Overall transfer level: Needs assistance Equipment used: Rolling walker (2 wheeled) Transfers: Sit to/from Stand Sit to Stand: Min assist         General transfer comment: Min assist for initial power up, hip extension, and steadying. Verbal cuing for hand placement when rising/sitting.  Ambulation/Gait Ambulation/Gait assistance: Min guard Gait Distance (Feet): 50 Feet Assistive device: Rolling walker (2 wheeled) Gait Pattern/deviations: Step-to pattern;Decreased stride length;Decreased weight shift to left;Step-through pattern;Antalgic Gait velocity: decr   General Gait Details: Min guard for safety, verbal cuing for upright posture, placement in RW, and sequencing.   Stairs             Wheelchair Mobility    Modified Rankin (Stroke Patients Only)       Balance Overall balance  assessment: Needs assistance Sitting-balance support: Feet supported Sitting balance-Leahy Scale: Good     Standing balance support: During functional activity;Bilateral upper extremity supported Standing balance-Leahy Scale: Fair                              Cognition Arousal/Alertness: Awake/alert Behavior During Therapy: WFL for tasks assessed/performed Overall Cognitive Status: Within Functional Limits for tasks assessed                                        Exercises Total Joint Exercises Long Arc Quad: AROM;Left;5 reps;Seated    General Comments        Pertinent Vitals/Pain Pain Assessment: Faces Faces Pain Scale: Hurts even more Pain Location: Lt hip Pain Descriptors / Indicators: Aching;Discomfort;Sore Pain Intervention(s): Limited activity within patient's tolerance;Monitored during session;Repositioned;Ice applied;Patient requesting pain meds-RN notified    Home Living                      Prior Function            PT Goals (current goals can now be found in the care plan section) Acute Rehab PT Goals Patient Stated Goal: get back more independence PT Goal Formulation: With patient Time For Goal Achievement: 04/21/20 Potential to Achieve Goals: Good Progress towards PT goals: Progressing toward goals    Frequency    7X/week      PT Plan Current plan remains  appropriate    Co-evaluation              AM-PAC PT "6 Clicks" Mobility   Outcome Measure  Help needed turning from your back to your side while in a flat bed without using bedrails?: None Help needed moving from lying on your back to sitting on the side of a flat bed without using bedrails?: A Little Help needed moving to and from a bed to a chair (including a wheelchair)?: A Little Help needed standing up from a chair using your arms (e.g., wheelchair or bedside chair)?: A Little Help needed to walk in hospital room?: A Little Help needed  climbing 3-5 steps with a railing? : A Little 6 Click Score: 19    End of Session Equipment Utilized During Treatment: Gait belt Activity Tolerance: Patient tolerated treatment well Patient left: in chair;with call bell/phone within reach;with family/visitor present Nurse Communication: Mobility status PT Visit Diagnosis: Muscle weakness (generalized) (M62.81);Difficulty in walking, not elsewhere classified (R26.2)     Time: 2575-0518 PT Time Calculation (min) (ACUTE ONLY): 16 min  Charges:  $Gait Training: 8-22 mins                     Christien Frankl E, PT [place] Pager [phone]  Office [phone]    Abrian Hanover D Elonda Husky 04/15/2020, 11:27 AM

## 2020-04-15 NOTE — Progress Notes (Signed)
Physical Therapy Treatment Patient Details Name: Sharon [name] MRN: [id number] DOB: June 27, 1955 Today's Date: 04/15/2020    History of Present Illness Patient is 64 y.o. female s/p Lt THA anterior approach on 04/14/20 with PMH significant for OA, migraine, GERD, melanoma, Lt TKA and revision, Rt TKA.    PT Comments    Pt reporting lower pain this session, states her pain feels more controlled this afternoon vs this morning. Pt ambulated hallway distance with use of RW and proficiently navigated steps with HHA and SPC. Pt demonstrates safe form and safety during gait and stair navigation, pt's friend who will be assisting pt at d/c present for session and understands all mobility education. PT administered, reviewed, and practiced HEP with pt, pt with no further questions. Pt appropriate to d/c home today from PT standpoint.   Follow Up Recommendations  Follow surgeon's recommendation for DC plan and follow-up therapies;Home health PT     Equipment Recommendations  None recommended by PT    Recommendations for Other Services       Precautions / Restrictions Precautions Precautions: Fall Restrictions Weight Bearing Restrictions: No Other Position/Activity Restrictions: WBAT    Mobility  Bed Mobility Overal bed mobility: Needs Assistance             General bed mobility comments: up in recliner  Transfers Overall transfer level: Needs assistance Equipment used: Rolling walker (2 wheeled) Transfers: Sit to/from Stand Sit to Stand: Supervision         General transfer comment: for safety, pt with correct hand placement when rising to standing. Pt's friend in room physically holding pt RUE during rise.  Ambulation/Gait Ambulation/Gait assistance: Supervision Gait Distance (Feet): 70 Feet Assistive device: Rolling walker (2 wheeled) Gait Pattern/deviations: Step-to pattern;Decreased stride length;Decreased weight shift to left;Step-through pattern;Antalgic Gait  velocity: decr   General Gait Details: supervision for safety, verbal cuing for upright posture, placement inside RW x1.   Stairs Stairs: Yes Stairs assistance: Min assist Stair Management: No rails;Step to pattern;Forwards;With cane Number of Stairs: 3 (x2) General stair comments: min assist to steady, PT providing HHA on pt's L side with cane in R hand. Verbal cuing for sequencing, step-to sequencing, and sequencing R leg leading ascending and L leg leading descending.   Wheelchair Mobility    Modified Rankin (Stroke Patients Only)       Balance Overall balance assessment: Needs assistance Sitting-balance support: Feet supported Sitting balance-Leahy Scale: Good     Standing balance support: During functional activity;Bilateral upper extremity supported Standing balance-Leahy Scale: Poor Standing balance comment: reliant on external support                            Cognition Arousal/Alertness: Awake/alert Behavior During Therapy: WFL for tasks assessed/performed Overall Cognitive Status: Within Functional Limits for tasks assessed                                        Exercises Total Joint Exercises Ankle Circles/Pumps: AROM;Both;Seated;5 reps Quad Sets: AROM;Left;5 reps;Seated Heel Slides: Left;Seated;AAROM;5 reps Long Arc Quad: AROM;Left;5 reps;Seated    General Comments        Pertinent Vitals/Pain Pain Assessment: 0-10 Pain Score: 6  Faces Pain Scale: Hurts even more Pain Location: Lt hip Pain Descriptors / Indicators: Aching;Discomfort;Sore Pain Intervention(s): Limited activity within patient's tolerance;Monitored during session;Premedicated before session;Repositioned    Home Living  Prior Function            PT Goals (current goals can now be found in the care plan section) Acute Rehab PT Goals Patient Stated Goal: get back more independence PT Goal Formulation: With patient Time  For Goal Achievement: 04/21/20 Potential to Achieve Goals: Good Progress towards PT goals: Progressing toward goals    Frequency    7X/week      PT Plan Current plan remains appropriate    Co-evaluation              AM-PAC PT "6 Clicks" Mobility   Outcome Measure  Help needed turning from your back to your side while in a flat bed without using bedrails?: None Help needed moving from lying on your back to sitting on the side of a flat bed without using bedrails?: A Little Help needed moving to and from a bed to a chair (including a wheelchair)?: A Little Help needed standing up from a chair using your arms (e.g., wheelchair or bedside chair)?: A Little Help needed to walk in hospital room?: A Little Help needed climbing 3-5 steps with a railing? : A Little 6 Click Score: 19    End of Session Equipment Utilized During Treatment: Gait belt Activity Tolerance: Patient tolerated treatment well Patient left: in chair;with family/visitor present;with call bell/phone within reach Nurse Communication: Mobility status PT Visit Diagnosis: Muscle weakness (generalized) (M62.81);Difficulty in walking, not elsewhere classified (R26.2)     Time: 1355-1415 PT Time Calculation (min) (ACUTE ONLY): 20 min  Charges:  $Gait Training: 8-22 mins                     Romain Erion E, PT Acute Rehabilitation Services Pager [phone]  Office [phone]     Daruis Swaim D Elonda Husky 04/15/2020, 2:45 PM

## 2020-04-15 NOTE — Discharge Summary (Signed)
Patient ID: Sharon [name] MRN: [id number] DOB/AGE: 65-Oct-1957 65 y.o.  Admit date: 04/14/2020 Discharge date: 04/15/2020  Admission Diagnoses:  Principal Problem:   Unilateral primary osteoarthritis, left hip Active Problems:   Status post total replacement of left hip   Discharge Diagnoses:  Same  Past Medical History:  Diagnosis Date  . Biliary colic   . Cancer ([place])    melanoma: lt. inner thight  . Carpal tunnel syndrome    bilateral  . Chronic allergic rhinitis   . Chronic urticaria   . Complication of anesthesia    confusion for a couple of days after waking after having multiple knee surgery's, does well with spinal  . Degenerative disk disease   . Diverticulosis of colon   . Fatty liver   . Fibrocystic breast disease 1996  . GERD (gastroesophageal reflux disease)   . History of colon polyps    HYPERPLASTIC 09-14-2015  . History of kidney stones    passed on own  . History of melanoma excision    left thigh ,inside 2009--- per pt also has had severeal excision of office for almost melanoma  . Migraine    since age 38 not as frequent now  . Moderate persistent asthma, uncomplicated    followed by dr gallagher ([place] allergy and asthma)  . OA (osteoarthritis)    all joints  . SUI (stress urinary incontinence, female)   . Wears glasses     Surgeries: Procedure(s): LEFT TOTAL HIP ARTHROPLASTY ANTERIOR APPROACH on 04/14/2020   Consultants:   Discharged Condition: Improved  Hospital Course: Sharon [name] is an 64 y.o. female who was admitted 04/14/2020 for operative treatment ofUnilateral primary osteoarthritis, left hip. Patient has severe unremitting pain that affects sleep, daily activities, and work/hobbies. After pre-op clearance the patient was taken to the operating room on 04/14/2020 and underwent  Procedure(s): LEFT TOTAL HIP ARTHROPLASTY ANTERIOR APPROACH.    Patient was given perioperative antibiotics:  Anti-infectives (From admission,  onward)   Start     Dose/Rate Route Frequency Ordered Stop   04/14/20 1400  clindamycin (CLEOCIN) IVPB 600 mg        600 mg 100 mL/hr over 30 Minutes Intravenous Every 6 hours 04/14/20 1107 04/14/20 2100   04/14/20 0600  clindamycin (CLEOCIN) IVPB 900 mg        900 mg 100 mL/hr over 30 Minutes Intravenous On call to O.R. 04/14/20 0539 04/14/20 0800       Patient was given sequential compression devices, early ambulation, and chemoprophylaxis to prevent DVT.  Patient benefited maximally from hospital stay and there were no complications.    Recent vital signs:  Patient Vitals for the past 24 hrs:  BP Temp Temp src Pulse Resp SpO2 Height  04/15/20 0942 (!) 106/52 98.4 F (36.9 C) Oral 76 17 99 % --  04/15/20 0708 137/66 99.8 F (37.7 C) Oral 69 20 98 % --  04/15/20 0142 (!) 114/55 99 F (37.2 C) Oral 78 16 98 % --  04/14/20 2143 (!) 150/70 99 F (37.2 C) Oral 70 15 97 % --  04/14/20 1412 (!) 152/92 -- -- 79 16 96 % --  04/14/20 1311 (!) 155/66 98.6 F (37 C) -- 85 16 96 % --  04/14/20 1210 (!) 152/70 -- -- (!) 58 18 99 % --  04/14/20 1118 -- -- -- -- -- -- 5' (1.524 m)     Recent laboratory studies:  Recent Labs    04/15/20 0402  WBC 12.1*  HGB 12.0  HCT 36.3  PLT 165  NA 140  K 3.5  CL 107  CO2 25  BUN 12  CREATININE 0.57  GLUCOSE 143*  CALCIUM 8.3*     Discharge Medications:   Allergies as of 04/15/2020      Reactions   Other Anaphylaxis   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 What changed: Another medication with the same name was changed. Make sure you understand how and when to take each.   albuterol (2.5 MG/3ML) 0.083% nebulizer solution Commonly known as: PROVENTIL USE ONE VIAL IN NEBULIZER EVERY 4 HOURS AS NEEDED FOR WHEEZING What changed:   how much to take  how to take this  when to take this  reasons to take this  additional instructions   aspirin 81 MG chewable tablet Chew 1 tablet (81 mg total) by mouth 2 (two) times daily.   BARIATRIC MULTIVITAMINS/IRON PO Take 1 tablet by mouth daily.   docusate sodium 100 MG capsule Commonly known as: COLACE Take 100 mg by mouth 2 (two) times daily as needed for mild constipation.   fexofenadine-pseudoephedrine 180-240 MG 24 hr tablet Commonly known as: ALLEGRA-D 24 Take 1 tablet by mouth daily.   fluticasone 50 MCG/ACT nasal spray Commonly known as: FLONASE Place 2 sprays into both nostrils at bedtime.   Melatonin 10 MG/ML Liqd Take 10 mg by mouth at bedtime as needed (sleep.).   methocarbamol 500 MG tablet Commonly known as: ROBAXIN TAKE ONE TABLET BY MOUTH EVERY 6 HOURS AS NEEDED FOR  BACK  PAIN What changed:   how much to take  how to take this  when to take this  reasons to take this  additional instructions   methocarbamol 500 MG tablet Commonly known as: ROBAXIN Take 1 tablet (500 mg total) by mouth every 6 (six) hours as needed for muscle spasms. What changed: You were already taking a medication with the same name, and this prescription was added. Make sure you understand how and when to take each.   nabumetone 750 MG tablet Commonly known as: RELAFEN TAKE 1 TABLET(750 MG) BY MOUTH TWICE DAILY AS NEEDED FOR PAIN What changed: See the new instructions.   ondansetron 8 MG disintegrating tablet Commonly known as: ZOFRAN-ODT DISSOLVE 1 TABLET IN MOUTH EVERY 8 HOURS AS NEEDED FOR NAUSEA OR VOMITING    oxyCODONE 5 MG immediate release tablet Commonly known as: Oxy IR/ROXICODONE Take 1-2 tablets (5-10 mg total) by mouth every 4 (four) hours as needed for moderate pain (pain score 4-6).   pantoprazole 40 MG tablet Commonly known as: PROTONIX TAKE 1 TABLET BY MOUTH EVERY DAY FOR REFLUX What changed:   how much to take  how to take this  when to take this  additional instructions   SALONPAS LIDOCAINE PLUS EX Place 1 patch onto the skin daily.   Viactiv Calcium Plus D 650-12.5-40 MG-MCG-MCG Chew Generic drug: Calcium-Vitamin D-Vitamin K  Chew 1 tablet by mouth in the morning, at noon, and at bedtime.            Durable Medical Equipment  (From admission, onward)         Start     Ordered   04/14/20 1108  DME 3 n 1  Once        04/14/20 1107   04/14/20 1108  DME Walker rolling  Once       Question Answer Comment  Walker: With 5 Inch Wheels   Patient needs a walker to treat with the following condition Status post total replacement of left hip      04/14/20 1107          Diagnostic Studies: DG Pelvis Portable  Result Date: 04/14/2020 CLINICAL DATA:  Status post total replacement of left hip. EXAM: PORTABLE PELVIS 1-2 VIEWS COMPARISON:  Intraoperative fluoroscopic images of the left hip 04/14/2020. FINDINGS: Status post left hip arthroplasty. The femoral and acetabular components are well-seated. No unexpected finding on this single view radiograph. IMPRESSION: Status post left hip arthroplasty. No unexpected finding on this single view radiograph. Electronically Signed   By: Kellie Simmering DO   On: 04/14/2020 14:21   DG C-Arm 1-60 Min-No Report  Result Date: 04/14/2020 Fluoroscopy was utilized by the requesting physician.  No radiographic interpretation.   DG HIP OPERATIVE UNILAT W OR W/O PELVIS LEFT  Result Date: 04/14/2020 CLINICAL DATA:  Intraoperative left anterior hip replacement EXAM: OPERATIVE LEFT HIP (WITH PELVIS IF PERFORMED) 1 VIEWS TECHNIQUE:  Fluoroscopic spot image(s) were submitted for interpretation post-operatively. COMPARISON:  Radiography 02/23/2020 FINDINGS: Two images show sequential stages of total left hip arthroplasty. No evidence of intraoperative fracture. The prosthesis is located. IMPRESSION: Fluoroscopy for left hip arthroplasty. Electronically Signed   By: Monte Fantasia M.D.   On: 04/14/2020 11:18    Disposition: Discharge disposition: 01-Home or Self Care          Follow-up Information    [facility] Follow up.   Specialty: [place] Why: to provide home health physical therapy Contact information: [address] [place] [address] [phone]        Mcarthur Rossetti, MD Follow up in 2 week(s).   Specialty: Orthopedic Surgery Contact information: [address] [place] [address] [phone]                Signed: Mcarthur Rossetti 04/15/2020, 11:17 AM

## 2020-04-15 NOTE — Discharge Instructions (Signed)

## 2020-04-15 NOTE — Plan of Care (Signed)
Pain management continuing.Pt inform to call for pain med when needed.

## 2020-04-16 NOTE — Anesthesia Postprocedure Evaluation (Signed)
Anesthesia Post Note  Patient: DAZHANE VILLAGOMEZ  Procedure(s) Performed: LEFT TOTAL HIP ARTHROPLASTY ANTERIOR APPROACH (Left Hip)     Patient location during evaluation: PACU Anesthesia Type: Spinal Level of consciousness: oriented and awake and alert Pain management: pain level controlled Vital Signs Assessment: post-procedure vital signs reviewed and stable Respiratory status: spontaneous breathing, respiratory function stable and patient connected to nasal cannula oxygen Cardiovascular status: blood pressure returned to baseline and stable Postop Assessment: no headache, no backache, no apparent nausea or vomiting and spinal receding Anesthetic complications: no   No complications documented.  Last Vitals:  Vitals:   04/15/20 0942 04/15/20 1337  BP: (!) 106/52 (!) 127/58  Pulse: 76 82  Resp: 17 17  Temp: 36.9 C 37 C  SpO2: 99% 100%    Last Pain:  Vitals:   04/15/20 1510  TempSrc:   PainSc: 8                  Tumeka Chimenti P Jaki Hammerschmidt

## 2020-04-17 ENCOUNTER — Encounter (HOSPITAL_COMMUNITY): Payer: Self-pay | Admitting: Orthopaedic Surgery

## 2020-04-18 ENCOUNTER — Telehealth: Payer: Self-pay

## 2020-04-18 NOTE — Telephone Encounter (Signed)
Olivia Mackie with [employer] called about prior authorzation for medication [phone]  [phone]

## 2020-04-22 DIAGNOSIS — M65879 Other synovitis and tenosynovitis, unspecified ankle and foot: Secondary | ICD-10-CM | POA: Diagnosis not present

## 2020-04-22 DIAGNOSIS — Z471 Aftercare following joint replacement surgery: Secondary | ICD-10-CM | POA: Diagnosis not present

## 2020-04-22 DIAGNOSIS — M5410 Radiculopathy, site unspecified: Secondary | ICD-10-CM | POA: Diagnosis not present

## 2020-04-22 DIAGNOSIS — J4540 Moderate persistent asthma, uncomplicated: Secondary | ICD-10-CM | POA: Diagnosis not present

## 2020-04-22 DIAGNOSIS — Z87891 Personal history of nicotine dependence: Secondary | ICD-10-CM | POA: Diagnosis not present

## 2020-04-22 DIAGNOSIS — J4520 Mild intermittent asthma, uncomplicated: Secondary | ICD-10-CM | POA: Diagnosis not present

## 2020-04-22 DIAGNOSIS — M5137 Other intervertebral disc degeneration, lumbosacral region: Secondary | ICD-10-CM | POA: Diagnosis not present

## 2020-04-22 DIAGNOSIS — Z96642 Presence of left artificial hip joint: Secondary | ICD-10-CM | POA: Diagnosis not present

## 2020-04-22 DIAGNOSIS — Z87442 Personal history of urinary calculi: Secondary | ICD-10-CM | POA: Diagnosis not present

## 2020-04-22 DIAGNOSIS — G8929 Other chronic pain: Secondary | ICD-10-CM | POA: Diagnosis not present

## 2020-04-22 DIAGNOSIS — M7061 Trochanteric bursitis, right hip: Secondary | ICD-10-CM | POA: Diagnosis not present

## 2020-04-22 DIAGNOSIS — G5603 Carpal tunnel syndrome, bilateral upper limbs: Secondary | ICD-10-CM | POA: Diagnosis not present

## 2020-04-22 DIAGNOSIS — Z85828 Personal history of other malignant neoplasm of skin: Secondary | ICD-10-CM | POA: Diagnosis not present

## 2020-04-22 DIAGNOSIS — Z86010 Personal history of colonic polyps: Secondary | ICD-10-CM | POA: Diagnosis not present

## 2020-04-22 DIAGNOSIS — K5790 Diverticulosis of intestine, part unspecified, without perforation or abscess without bleeding: Secondary | ICD-10-CM | POA: Diagnosis not present

## 2020-04-22 DIAGNOSIS — N393 Stress incontinence (female) (male): Secondary | ICD-10-CM | POA: Diagnosis not present

## 2020-04-22 DIAGNOSIS — Z6841 Body Mass Index (BMI) 40.0 and over, adult: Secondary | ICD-10-CM | POA: Diagnosis not present

## 2020-04-22 DIAGNOSIS — E669 Obesity, unspecified: Secondary | ICD-10-CM | POA: Diagnosis not present

## 2020-04-22 DIAGNOSIS — Z85820 Personal history of malignant melanoma of skin: Secondary | ICD-10-CM | POA: Diagnosis not present

## 2020-04-22 DIAGNOSIS — I10 Essential (primary) hypertension: Secondary | ICD-10-CM | POA: Diagnosis not present

## 2020-04-22 DIAGNOSIS — K760 Fatty (change of) liver, not elsewhere classified: Secondary | ICD-10-CM | POA: Diagnosis not present

## 2020-04-22 DIAGNOSIS — M1611 Unilateral primary osteoarthritis, right hip: Secondary | ICD-10-CM | POA: Diagnosis not present

## 2020-04-22 DIAGNOSIS — G43909 Migraine, unspecified, not intractable, without status migrainosus: Secondary | ICD-10-CM | POA: Diagnosis not present

## 2020-04-22 DIAGNOSIS — M5127 Other intervertebral disc displacement, lumbosacral region: Secondary | ICD-10-CM | POA: Diagnosis not present

## 2020-04-22 DIAGNOSIS — K219 Gastro-esophageal reflux disease without esophagitis: Secondary | ICD-10-CM | POA: Diagnosis not present

## 2020-04-26 ENCOUNTER — Telehealth: Payer: Self-pay

## 2020-04-26 NOTE — Telephone Encounter (Signed)
Lvm giving verbal ok 

## 2020-04-26 NOTE — Telephone Encounter (Signed)
Amy from [facility] called she is requesting verbal orders for physical therapy. CB:[phone]

## 2020-04-26 NOTE — Telephone Encounter (Signed)
Is this ok?

## 2020-04-26 NOTE — Telephone Encounter (Signed)
That will be fine. 

## 2020-04-27 ENCOUNTER — Encounter: Payer: Self-pay | Admitting: Orthopaedic Surgery

## 2020-04-27 ENCOUNTER — Ambulatory Visit (INDEPENDENT_AMBULATORY_CARE_PROVIDER_SITE_OTHER): Payer: PPO | Admitting: Orthopaedic Surgery

## 2020-04-27 DIAGNOSIS — Z96642 Presence of left artificial hip joint: Secondary | ICD-10-CM

## 2020-04-27 MED ORDER — DOXYCYCLINE HYCLATE 100 MG PO TABS: 100.0000 mg | ORAL_TABLET | Freq: Two times a day (BID) | ORAL | 0 refills | Status: DC

## 2020-04-27 NOTE — Progress Notes (Signed)
The patient is 2 weeks tomorrow status post a left total hip arthroplasty.  She is someone who is morbidly obese and has a large thigh.  She reports that she has done very well from the pain aspect of her surgery and is getting around with a rolling walker.  She is having more muscle pain.  She does report drainage from her hip incision.  On examination of her left hip incision, the sutures are intact.  There is an area of dehiscence at the groin crease area.  I remove the sutures but did not remove the distal sutures.  I did aspirate about 160 cc of seroma fluid from her hip.  There is no evidence of infection.  I am still going to prophylactically put her on doxycycline twice a day.  I am when I have her wash the wound in the groin crease daily with antibacterial soap and water and then dry will well.  She will then place mupirocin ointment on the wound and a dry dressing.  I would like to see her back in just 6 days.  We will likely attempt to aspirate more surrounding then.  I told her to expect drainage as well.  All questions and concerns were answered and addressed.  She understands this is a complication as a relates to obesity and a large soft tissue envelope at the thigh and groin area.

## 2020-04-28 DIAGNOSIS — Z85820 Personal history of malignant melanoma of skin: Secondary | ICD-10-CM | POA: Diagnosis not present

## 2020-04-28 DIAGNOSIS — K760 Fatty (change of) liver, not elsewhere classified: Secondary | ICD-10-CM | POA: Diagnosis not present

## 2020-04-28 DIAGNOSIS — M5127 Other intervertebral disc displacement, lumbosacral region: Secondary | ICD-10-CM | POA: Diagnosis not present

## 2020-04-28 DIAGNOSIS — J4540 Moderate persistent asthma, uncomplicated: Secondary | ICD-10-CM | POA: Diagnosis not present

## 2020-04-28 DIAGNOSIS — G8929 Other chronic pain: Secondary | ICD-10-CM | POA: Diagnosis not present

## 2020-04-28 DIAGNOSIS — Z87442 Personal history of urinary calculi: Secondary | ICD-10-CM | POA: Diagnosis not present

## 2020-04-28 DIAGNOSIS — M5137 Other intervertebral disc degeneration, lumbosacral region: Secondary | ICD-10-CM | POA: Diagnosis not present

## 2020-04-28 DIAGNOSIS — E669 Obesity, unspecified: Secondary | ICD-10-CM | POA: Diagnosis not present

## 2020-04-28 DIAGNOSIS — M5410 Radiculopathy, site unspecified: Secondary | ICD-10-CM | POA: Diagnosis not present

## 2020-04-28 DIAGNOSIS — Z85828 Personal history of other malignant neoplasm of skin: Secondary | ICD-10-CM | POA: Diagnosis not present

## 2020-04-28 DIAGNOSIS — M65879 Other synovitis and tenosynovitis, unspecified ankle and foot: Secondary | ICD-10-CM | POA: Diagnosis not present

## 2020-04-28 DIAGNOSIS — J4520 Mild intermittent asthma, uncomplicated: Secondary | ICD-10-CM | POA: Diagnosis not present

## 2020-04-28 DIAGNOSIS — Z6841 Body Mass Index (BMI) 40.0 and over, adult: Secondary | ICD-10-CM | POA: Diagnosis not present

## 2020-04-28 DIAGNOSIS — K219 Gastro-esophageal reflux disease without esophagitis: Secondary | ICD-10-CM | POA: Diagnosis not present

## 2020-04-28 DIAGNOSIS — G43909 Migraine, unspecified, not intractable, without status migrainosus: Secondary | ICD-10-CM | POA: Diagnosis not present

## 2020-04-28 DIAGNOSIS — Z86010 Personal history of colonic polyps: Secondary | ICD-10-CM | POA: Diagnosis not present

## 2020-04-28 DIAGNOSIS — I10 Essential (primary) hypertension: Secondary | ICD-10-CM | POA: Diagnosis not present

## 2020-04-28 DIAGNOSIS — K5790 Diverticulosis of intestine, part unspecified, without perforation or abscess without bleeding: Secondary | ICD-10-CM | POA: Diagnosis not present

## 2020-04-28 DIAGNOSIS — N393 Stress incontinence (female) (male): Secondary | ICD-10-CM | POA: Diagnosis not present

## 2020-04-28 DIAGNOSIS — M7061 Trochanteric bursitis, right hip: Secondary | ICD-10-CM | POA: Diagnosis not present

## 2020-04-28 DIAGNOSIS — G5603 Carpal tunnel syndrome, bilateral upper limbs: Secondary | ICD-10-CM | POA: Diagnosis not present

## 2020-04-28 DIAGNOSIS — Z87891 Personal history of nicotine dependence: Secondary | ICD-10-CM | POA: Diagnosis not present

## 2020-04-28 DIAGNOSIS — M1611 Unilateral primary osteoarthritis, right hip: Secondary | ICD-10-CM | POA: Diagnosis not present

## 2020-04-28 DIAGNOSIS — Z471 Aftercare following joint replacement surgery: Secondary | ICD-10-CM | POA: Diagnosis not present

## 2020-04-28 DIAGNOSIS — Z96642 Presence of left artificial hip joint: Secondary | ICD-10-CM | POA: Diagnosis not present

## 2020-05-02 DIAGNOSIS — M7061 Trochanteric bursitis, right hip: Secondary | ICD-10-CM | POA: Diagnosis not present

## 2020-05-02 DIAGNOSIS — M1611 Unilateral primary osteoarthritis, right hip: Secondary | ICD-10-CM | POA: Diagnosis not present

## 2020-05-02 DIAGNOSIS — Z87442 Personal history of urinary calculi: Secondary | ICD-10-CM | POA: Diagnosis not present

## 2020-05-02 DIAGNOSIS — N393 Stress incontinence (female) (male): Secondary | ICD-10-CM | POA: Diagnosis not present

## 2020-05-02 DIAGNOSIS — J4540 Moderate persistent asthma, uncomplicated: Secondary | ICD-10-CM | POA: Diagnosis not present

## 2020-05-02 DIAGNOSIS — M5127 Other intervertebral disc displacement, lumbosacral region: Secondary | ICD-10-CM | POA: Diagnosis not present

## 2020-05-02 DIAGNOSIS — G43909 Migraine, unspecified, not intractable, without status migrainosus: Secondary | ICD-10-CM | POA: Diagnosis not present

## 2020-05-02 DIAGNOSIS — Z6841 Body Mass Index (BMI) 40.0 and over, adult: Secondary | ICD-10-CM | POA: Diagnosis not present

## 2020-05-02 DIAGNOSIS — J4520 Mild intermittent asthma, uncomplicated: Secondary | ICD-10-CM | POA: Diagnosis not present

## 2020-05-02 DIAGNOSIS — Z85820 Personal history of malignant melanoma of skin: Secondary | ICD-10-CM | POA: Diagnosis not present

## 2020-05-02 DIAGNOSIS — M5137 Other intervertebral disc degeneration, lumbosacral region: Secondary | ICD-10-CM | POA: Diagnosis not present

## 2020-05-02 DIAGNOSIS — E669 Obesity, unspecified: Secondary | ICD-10-CM | POA: Diagnosis not present

## 2020-05-02 DIAGNOSIS — G8929 Other chronic pain: Secondary | ICD-10-CM | POA: Diagnosis not present

## 2020-05-02 DIAGNOSIS — G5603 Carpal tunnel syndrome, bilateral upper limbs: Secondary | ICD-10-CM | POA: Diagnosis not present

## 2020-05-02 DIAGNOSIS — Z85828 Personal history of other malignant neoplasm of skin: Secondary | ICD-10-CM | POA: Diagnosis not present

## 2020-05-02 DIAGNOSIS — I10 Essential (primary) hypertension: Secondary | ICD-10-CM | POA: Diagnosis not present

## 2020-05-02 DIAGNOSIS — Z96642 Presence of left artificial hip joint: Secondary | ICD-10-CM | POA: Diagnosis not present

## 2020-05-02 DIAGNOSIS — M65879 Other synovitis and tenosynovitis, unspecified ankle and foot: Secondary | ICD-10-CM | POA: Diagnosis not present

## 2020-05-02 DIAGNOSIS — Z86010 Personal history of colonic polyps: Secondary | ICD-10-CM | POA: Diagnosis not present

## 2020-05-02 DIAGNOSIS — M5410 Radiculopathy, site unspecified: Secondary | ICD-10-CM | POA: Diagnosis not present

## 2020-05-02 DIAGNOSIS — K219 Gastro-esophageal reflux disease without esophagitis: Secondary | ICD-10-CM | POA: Diagnosis not present

## 2020-05-02 DIAGNOSIS — Z471 Aftercare following joint replacement surgery: Secondary | ICD-10-CM | POA: Diagnosis not present

## 2020-05-02 DIAGNOSIS — Z87891 Personal history of nicotine dependence: Secondary | ICD-10-CM | POA: Diagnosis not present

## 2020-05-02 DIAGNOSIS — K760 Fatty (change of) liver, not elsewhere classified: Secondary | ICD-10-CM | POA: Diagnosis not present

## 2020-05-02 DIAGNOSIS — K5790 Diverticulosis of intestine, part unspecified, without perforation or abscess without bleeding: Secondary | ICD-10-CM | POA: Diagnosis not present

## 2020-05-03 ENCOUNTER — Ambulatory Visit (INDEPENDENT_AMBULATORY_CARE_PROVIDER_SITE_OTHER): Payer: PPO | Admitting: Orthopaedic Surgery

## 2020-05-03 ENCOUNTER — Encounter: Payer: Self-pay | Admitting: Orthopaedic Surgery

## 2020-05-03 DIAGNOSIS — Z96642 Presence of left artificial hip joint: Secondary | ICD-10-CM

## 2020-05-03 MED ORDER — DOXYCYCLINE HYCLATE 100 MG PO TABS
100.0000 mg | ORAL_TABLET | Freq: Two times a day (BID) | ORAL | 0 refills | Status: DC
Start: 2020-05-03 — End: 2020-05-23

## 2020-05-03 NOTE — Progress Notes (Signed)
The patient is a very pleasant 64 year old female who is now 2 and half week status post a left total hip arthroplasty.  She is someone with a BMI of over 40.  She does have a large thigh.  She has developed a wound dehiscence at the superior aspect of her wound.  I have her on doxycycline.  I been able to get a lot of fluid off of her hip.  There is been no evidence of any infection.  She denies any fever, chills, nausea, vomiting.  She says the hip feels wonderful.  She has been cleaning it daily with Dial soapy water and placing mupirocin ointment over the wound with new dressings.  On exam there is an area of dehiscence at about 2 x 3 cm.  This is at the superior aspect of the wound.  I removed all of her sutures.  I aspirated a lot of fluid out of this hip and expressed a lot of fluid out of the area that was all serous fluid no purulence and no odor.  She is ambulate with a rolling walker and says her hip pain is gone from her severe arthritis.  I would like to have her continue to clean this area daily.  I would like to send her to wound treatment center to see what they can do to help get this area to heal.  I will continue her doxycycline as well.  I would like to see her back in 2 weeks to see how she is doing overall.  If things worsen in any way at all she knows to let us know.

## 2020-05-03 NOTE — Addendum Note (Signed)
Addended by: Elvin So L on: 05/03/2020 11:40 AM   Modules accepted: Orders

## 2020-05-04 DIAGNOSIS — Z79899 Other long term (current) drug therapy: Secondary | ICD-10-CM | POA: Diagnosis not present

## 2020-05-04 DIAGNOSIS — G894 Chronic pain syndrome: Secondary | ICD-10-CM | POA: Diagnosis not present

## 2020-05-04 DIAGNOSIS — M25552 Pain in left hip: Secondary | ICD-10-CM | POA: Diagnosis not present

## 2020-05-08 ENCOUNTER — Other Ambulatory Visit: Payer: Self-pay | Admitting: Orthopaedic Surgery

## 2020-05-08 ENCOUNTER — Encounter: Payer: Self-pay | Admitting: Orthopaedic Surgery

## 2020-05-08 MED ORDER — MUPIROCIN 2 % EX OINT: 1.0000 "application " | TOPICAL_OINTMENT | Freq: Every day | CUTANEOUS | 0 refills | Status: DC

## 2020-05-09 ENCOUNTER — Ambulatory Visit: Payer: PPO | Admitting: Dietician

## 2020-05-11 ENCOUNTER — Other Ambulatory Visit
Admission: RE | Admit: 2020-05-11 | Discharge: 2020-05-11 | Disposition: A | Discharge status: Home / Self Care | Payer: PPO | Source: Ambulatory Visit | Attending: Physician Assistant | Admitting: Physician Assistant

## 2020-05-11 ENCOUNTER — Other Ambulatory Visit: Payer: Self-pay

## 2020-05-11 ENCOUNTER — Encounter: Payer: PPO | Attending: Physician Assistant | Admitting: Physician Assistant

## 2020-05-11 DIAGNOSIS — Y831 Surgical operation with implant of artificial internal device as the cause of abnormal reaction of the patient, or of later complication, without mention of misadventure at the time of the procedure: Secondary | ICD-10-CM | POA: Insufficient documentation

## 2020-05-11 DIAGNOSIS — Z96642 Presence of left artificial hip joint: Secondary | ICD-10-CM | POA: Insufficient documentation

## 2020-05-11 DIAGNOSIS — L98495 Non-pressure chronic ulcer of skin of other sites with muscle involvement without evidence of necrosis: Secondary | ICD-10-CM | POA: Insufficient documentation

## 2020-05-11 DIAGNOSIS — T8131XA Disruption of external operation (surgical) wound, not elsewhere classified, initial encounter: Secondary | ICD-10-CM | POA: Diagnosis not present

## 2020-05-11 DIAGNOSIS — B999 Unspecified infectious disease: Secondary | ICD-10-CM | POA: Insufficient documentation

## 2020-05-12 DIAGNOSIS — S71102A Unspecified open wound, left thigh, initial encounter: Secondary | ICD-10-CM | POA: Diagnosis not present

## 2020-05-12 NOTE — Progress Notes (Signed)
BETSIE, PECKMAN ([id number]) Visit Report for 05/11/2020 Chief Complaint Document Details Patient Name: Sharon [name], Sharon [name]. Date of Service: 05/11/2020 12:45 PM Medical Record Number: [id number] Patient Account Number: [id number] Date of Birth/Sex: [birth year] (64 y.o. F) Treating RN: Cornell Barman Primary Care Provider: Elvia Collum Other Clinician: Referring Provider: Elvia Collum Treating Provider/Extender: Skipper Cliche in Treatment: 0 Information Obtained from: Patient Chief Complaint Surgical wound dehiscence Electronic Signature(s) Signed: 05/11/2020 4:47:41 PM By: Worthy Keeler PA-C Entered By: Worthy Keeler on 05/11/2020 16:47:41 Sharon [name] ([id number]) -------------------------------------------------------------------------------- HPI Details Patient Name: Sharon [name] Date of Service: 05/11/2020 12:45 PM Medical Record Number: [id number] Patient Account Number: [id number] Date of Birth/Sex: 1955/06/09 (64 y.o. F) Treating RN: Cornell Barman Primary Care Provider: Elvia Collum Other Clinician: Referring Provider: Elvia Collum Treating Provider/Extender: Skipper Cliche in Treatment: 0 History of Present Illness HPI Description: 05/11/2020 upon evaluation today patient appears to be doing somewhat poorly in regard to her wound on the left hip/groin area status post having had a total hip replacement on the left as performed by Dr. Jean Rosenthal. She was admitted to the hospital on 04/14/2020 discharged 04/15/2020. She does tell me that following the surgery she has done very well from the standpoint of pain issues he is no major issues with pain currently. With that being said unfortunately when the sutures were removed she tells me the wound dehisced and she has had issues since that time. She does have a wound that appears to show some signs of granulation with that being said unfortunately she also upon further evaluation appears to have a  network of tunneling and undermining underneath even this area of apparent granulation tissue in the central portion of the wound. This has me concerned as I feel like this area is getting need to be packed. If not I am afraid that she is going to continue to develop a seroma and potentially even develop into infection. I discussed this with the patient today. With that being said we will obviously initiate packing today in order to try to allow this to heal more appropriately. With regard to the wound VAC again I discussed with the patient that I am not sure that skin to be appropriate currently considering the significant undermining that was seen I am not certain that she would really get the benefit necessary currently. I am not concerned about the multiple openings externally I am more concerned about the internal tracking and tunneling that I think would be more of an issue. The patient voiced understanding. With that being said also told her that ongoing I discussed the situation with her surgeon as well and I did put in a call with him today I will discuss that in the plan. Electronic Signature(s) Signed: 05/11/2020 4:47:48 PM By: Worthy Keeler PA-C Entered By: Worthy Keeler on 05/11/2020 16:47:48 Sharon [name] ([id number]) -------------------------------------------------------------------------------- Physical Exam Details Patient Name: Sharon [name] Date of Service: 05/11/2020 12:45 PM Medical Record Number: [id number] Patient Account Number: [id number] Date of Birth/Sex: [birth year] (64 y.o. F) Treating RN: Cornell Barman Primary Care Provider: Elvia Collum Other Clinician: Referring Provider: Elvia Collum Treating Provider/Extender: Skipper Cliche in Treatment: 0 Constitutional sitting or standing blood pressure is within target range for patient.. pulse regular and within target range for patient.Marland Kitchen respirations regular, non- labored and within target range for  patient.Marland Kitchen temperature within target range for patient.. Well-nourished and well-hydrated in no acute distress. Eyes conjunctiva clear no eyelid edema noted.  pupils equal round and reactive to light and accommodation. Ears, Nose, Mouth, and Throat no gross abnormality of ear auricles or external auditory canals. normal hearing noted during conversation. mucus membranes moist. Respiratory normal breathing without difficulty. Cardiovascular no clubbing, cyanosis, significant edema, <3 sec cap refill. Psychiatric this patient is able to make decisions and demonstrates good insight into disease process. Alert and Oriented x 3. pleasant and cooperative. Notes Upon inspection again the patient appeared upon initial inspection to have some good granulation tissue and to be honest I did not really feel like this was too severe. However upon further inspection at the 12:00 and roughly 6:00 location she had openings here that actually tunneled and again at the 12:00 location we were somewhere between 3 to 4 cm headed towards the hip proximally. In the 6:00 location we were somewhere between 6 and 7 cm proceeding down the thigh. With that being said unfortunately this area also connected underneath the newly formed granulation tissue in the central portion of the wound so that the opening into the 6:00 and opening to the 12:00 could actually be reached from going underneath the wound bed. Obviously this seems to be a fairly extensive track of tunneling and issues here that I am afraid is going to significantly delay healing. I am also concerned that if we do not pack this appropriately that she would end up with potentially an abscess and infection and leading to further surgery in that regard. Ideally my opinion would be that I think the wound needs to be likely centrally opened so that it can fully be packed with a wound VAC and allow to granulate in from the bottom out I think this network of tunneling  and sinus tracts underneath is good to make this very difficult to heal at this point I discussed that with the patient today as well. She voiced understanding this is also something that I contacted Dr. Ninfa Linden and discussed with him today as well to try to explain the hindrance that working to see from a wound care perspective. This wound again is in the left groin/hip location. Electronic Signature(s) Signed: 05/11/2020 4:50:24 PM By: Worthy Keeler PA-C Entered By: Worthy Keeler on 05/11/2020 16:50:24 Sharon [name] ([id number]) -------------------------------------------------------------------------------- Physician Orders Details Patient Name: Sharon [name]. Date of Service: 05/11/2020 12:45 PM Medical Record Number: [id number] Patient Account Number: [id number] Date of Birth/Sex: [birth year] (64 y.o. F) Treating RN: Dolan Amen Primary Care Provider: Elvia Collum Other Clinician: Referring Provider: Elvia Collum Treating Provider/Extender: Skipper Cliche in Treatment: 0 Verbal / Phone Orders: No Diagnosis Coding Wound Cleansing Wound #1 Left Groin o Clean wound with Normal Saline. o Other: - May shower with dressing in place Primary Wound Dressing Wound #1 Left Groin o Silver Alginate - placed on top o Iodoform packing Gauze - 1/4 inch in tunnels Secondary Dressing Wound #1 Left Groin o ABD pad o Tegaderm Dressing Change Frequency Wound #1 Left Groin o Change dressing every day. Follow-up Appointments Wound #1 Left Groin o Return Appointment in 1 week. Laboratory o Bacteria identified in Wound by Culture (MICRO) oooo LOINC Code: [id number] oooo Convenience Name: Wound culture routine Electronic Signature(s) Signed: 05/11/2020 4:55:27 PM By: Worthy Keeler PA-C Signed: 05/11/2020 5:08:10 PM By: Georges Mouse, Minus Breeding RN Entered By: Georges Mouse, Minus Breeding on 05/11/2020 14:16:55 Sharon [name]  ([id number]) -------------------------------------------------------------------------------- Problem List Details Patient Name: Sharon [name]. Date of Service: 05/11/2020 12:45 PM Medical Record Number: [id number] Patient Account Number: [id number]  Date of Birth/Sex: 09-Sep-1955 (64 y.o. 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 Date of Service: 05/11/2020 12:45 PM Medical Record Number: [id number] Patient Account Number: [id number] Date of Birth/Sex: 27-Aug-1955 (64 y.o. F) Treating RN: Cornell Barman Primary Care Provider: Elvia Collum Other Clinician: Referring Provider: Elvia Collum Treating Provider/Extender: Skipper Cliche in Treatment: 0 Subjective Chief Complaint Information obtained from Patient Surgical wound dehiscence History of Present Illness (HPI) 05/11/2020 upon evaluation today patient appears to be doing somewhat poorly in regard to her wound on the left hip/groin area status post having had a total hip replacement on the left as performed by Dr. Jean Rosenthal. She was admitted to the hospital on 04/14/2020 discharged 04/15/2020. She does tell me that following  the surgery she has done very well from the standpoint of pain issues he is no major issues with pain currently. With that being said unfortunately when the sutures were removed she tells me the wound dehisced and she has had issues since that time. She does have a wound that appears to show some signs of granulation with that being said unfortunately she also upon further evaluation appears to have a network of tunneling and undermining underneath even this area of apparent granulation tissue in the central portion of the wound. This has me concerned as I feel like this area is getting need to be packed. If not I am afraid that she is going to continue to develop a seroma and potentially even develop into infection. I discussed this with the patient today. With that being said we will obviously initiate packing today in order to try to allow this to heal more appropriately. With regard to the wound VAC again I discussed with the patient that I am not sure that skin to be appropriate currently considering the significant undermining that was seen I am not certain that she would really get the benefit necessary currently. I am not concerned about the multiple openings externally I am more concerned about the internal tracking and tunneling that I think would be more of an issue. The patient voiced understanding. With that being said also told her that ongoing I discussed the situation with her surgeon as well and I did put in a call with him today I will discuss that in the plan. Patient History Information obtained from Patient. Allergies Augmentin (Severity: Moderate, Reaction: Diarrhea), neomycin (Reaction: hives), adhesive (Severity: Moderate), latex (Reaction: hives) Family History Cancer - Mother,Father, Diabetes - Maternal Grandparents, Hypertension - Father, Lung Disease - Father, Stroke - Father, Thyroid Problems - Maternal Grandparents, No family history of Heart Disease, Hereditary  Spherocytosis, Kidney Disease, Seizures, Tuberculosis. Social History Never smoker, Marital Status - Divorced, Alcohol Use - Moderate, Drug Use - No History, Caffeine Use - Daily - coffee. Medical History Eyes Denies history of Cataracts, Glaucoma, Optic Neuritis Ear/Nose/Mouth/Throat Denies history of Chronic sinus problems/congestion, Middle ear problems Hematologic/Lymphatic Denies history of Anemia, Hemophilia, Human Immunodeficiency  Virus, Lymphedema, Sickle Cell Disease Respiratory Denies history of Aspiration, Asthma, Chronic Obstructive Pulmonary Disease (COPD), Pneumothorax, Sleep Apnea, Tuberculosis Cardiovascular Denies history of Angina, Arrhythmia, Congestive Heart Failure, Coronary Artery Disease, Deep Vein Thrombosis, Hypertension, Hypotension, Myocardial Infarction, Peripheral Arterial Disease, Peripheral Venous Disease, Phlebitis, Vasculitis Gastrointestinal Denies history of Cirrhosis , Colitis, Crohn s, Hepatitis A, Hepatitis B, Hepatitis C Endocrine Denies history of Type I Diabetes, Type II Diabetes Genitourinary Denies history of End Stage Renal Disease Immunological Denies history of Lupus Erythematosus, Raynaud s, Scleroderma Integumentary (Skin) Denies history of History of Burn, History of pressure wounds Musculoskeletal Patient has history of Osteoarthritis Denies history of Gout, Rheumatoid Arthritis, Osteomyelitis Neurologic Denies history of Dementia, Neuropathy, Quadriplegia, Paraplegia, Seizure Disorder Oncologic Denies history of Received Chemotherapy, Received Radiation Psychiatric Sharon [name], Sharon [name] ([id number]) Denies history of Anorexia/bulimia, Confinement Anxiety Hospitalization/Surgery History - total knees. - hysterectomy. - appendectomy. - tonsillectomy. - cholesectomy. - left hip replacement. Medical And Surgical History Notes Respiratory alpha gal d/t tick bite that caused 'adult asthma' Oncologic hx of melanoma Review of Systems  (ROS) Constitutional Symptoms (General Health) Denies complaints or symptoms of Fatigue, Fever, Chills, Marked Weight Change. Eyes Complains or has symptoms of Glasses / Contacts. Denies complaints or symptoms of Dry Eyes, Vision Changes. Ear/Nose/Mouth/Throat Denies complaints or symptoms of Difficult clearing ears, Sinusitis. Hematologic/Lymphatic Denies complaints or symptoms of Bleeding / Clotting Disorders, Human Immunodeficiency Virus. Respiratory Denies complaints or symptoms of Chronic or frequent coughs, Shortness of Breath. Cardiovascular Denies complaints or symptoms of Chest pain, LE edema. Gastrointestinal Denies complaints or symptoms of Frequent diarrhea, Nausea, Vomiting. Endocrine Denies complaints or symptoms of Hepatitis, Thyroid disease, Polydypsia (Excessive Thirst). Genitourinary Denies complaints or symptoms of Kidney failure/ Dialysis, Incontinence/dribbling. Immunological Denies complaints or symptoms of Hives, Itching. Integumentary (Skin) Complains or has symptoms of Wounds. Denies complaints or symptoms of Bleeding or bruising tendency, Breakdown, Swelling. Musculoskeletal Complains or has symptoms of Muscle Pain, Muscle Weakness. Neurologic Denies complaints or symptoms of Numbness/parasthesias, Focal/Weakness. Psychiatric Denies complaints or symptoms of Anxiety, Claustrophobia. Objective Constitutional sitting or standing blood pressure is within target range for patient.. pulse regular and within target range for patient.Marland Kitchen respirations regular, non- labored and within target range for patient.Marland Kitchen temperature within target range for patient.. Well-nourished and well-hydrated in no acute distress. Vitals Time Taken: 1:02 PM, Temperature: 98.7 F, Pulse: 86 bpm, Respiratory Rate: 18 breaths/min, Blood Pressure: 124/72 mmHg. Eyes conjunctiva clear no eyelid edema noted. pupils equal round and reactive to light and accommodation. Ears, Nose, Mouth, and  Throat no gross abnormality of ear auricles or external auditory canals. normal hearing noted during conversation. mucus membranes moist. Respiratory normal breathing without difficulty. Cardiovascular no clubbing, cyanosis, significant edema, Psychiatric this patient is able to make decisions and demonstrates good insight into disease process. Alert and Oriented x 3. pleasant and cooperative. General Notes: Upon inspection again the patient appeared upon initial inspection to have some good granulation tissue and to be honest I did not really feel like this was too severe. However upon further inspection at the 12:00 and roughly 6:00 location she had openings here that actually tunneled and again at the 12:00 location we were somewhere between 3 to 4 cm headed towards the hip proximally. In the 6:00 location we were somewhere between 6 and 7 cm proceeding down the thigh. With that being said unfortunately this area also connected underneath the newly Sharon [name], Sharon K. ([id number]) formed granulation tissue in the central portion of the wound so that the opening into  the 6:00 and opening to the 12:00 could actually be reached from going underneath the wound bed. Obviously this seems to be a fairly extensive track of tunneling and issues here that I am afraid is going to significantly delay healing. I am also concerned that if we do not pack this appropriately that she would end up with potentially an abscess and infection and leading to further surgery in that regard. Ideally my opinion would be that I think the wound needs to be likely centrally opened so that it can fully be packed with a wound VAC and allow to granulate in from the bottom out I think this network of tunneling and sinus tracts underneath is good to make this very difficult to heal at this point I discussed that with the patient today as well. She voiced understanding this is also something that I contacted Dr. Ninfa Linden and  discussed with him today as well to try to explain the hindrance that working to see from a wound care perspective. This wound again is in the left groin/hip location. Integumentary (Hair, Skin) Wound #1 status is Open. Original cause of wound was Surgical Injury. The wound is located on the Left Groin. The wound measures 2cm length x 2.2cm width x 1.4cm depth; 3.456cm^2 area and 4.838cm^3 volume. There is muscle and Fat Layer (Subcutaneous Tissue) exposed. There is tunneling at 6:00 with a maximum distance of 1.4cm. There is a medium amount of sanguinous drainage noted. There is large (67-100%) red, hyper - granulation within the wound bed. There is no necrotic tissue within the wound bed. 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 Other: - May shower with dressing in place Primary Wound Dressing: Wound #1 Left Groin: Silver Alginate - placed on top Iodoform packing Gauze - 1/4 inch in tunnels Secondary Dressing: Wound #1 Left Groin: ABD pad Tegaderm Dressing Change Frequency: Wound #1 Left Groin: Change dressing every day. Follow-up Appointments: Wound #1 Left Groin: Return Appointment in 1 week. Laboratory ordered were: Wound culture routine 1. My suggestion at this time is can be that we actually pack the undermined areas at 6 and 12:00 with 1/4 inch iodoform gauze. This should be packed to where there is resistance but not over pack to the point that it stretches or causes this to open further. That is not the plan and what we are looking for this point. The good news is I do not seem to probe to any bone or hardware at this point that I can fill anyway which is good news. Over top of this we will fill in the open  wound area with a silver alginate dressing until should be left out in order to allow this to drain and be easily removed nothing gets lost in the undermining areas. With that being said all this was discussed with the patient as well as her daughter today so that her daughter who is performing the dressing changes now will know what to do. 2. Also recommend that the patient continue to monitor for any signs of worsening infection such as erythema, increased pain, fevers, chills, nausea, vomiting, or diarrhea anything occurs she should contact the office and let me know. We are going to cover this area with ABD pad and Tegaderm as the patient is allergic to general  adhesive. 3. With regard to the conversation with Dr. Ninfa Linden I did explain that I really felt like the wound likely needed to be opened up considering the extensive network of tracking underneath in order to be able to then appropriately place a wound VAC and allow this to granulate in from the bottom out. With that being said Dr. Ninfa Linden stated that he will be seeing the patient in follow-up not too long from now and he will see where things stand at that point. With that being said at this point his opinion is he would prefer not to have to go in and reopen this. I am not sure that there is good be a choice however from a wound healing perspective I am not certain that we are going to be able to accomplish what we need as far as getting this to close. Nonetheless we will see how things do over the next week especially and then make adjustments as needed from there. We will see patient back for reevaluation in 1 week here in the clinic. If anything worsens or changes patient will contact our office for additional recommendations. Sharon [name], Sharon [name] ([id number]) Electronic Signature(s) Signed: 05/11/2020 4:53:10 PM By: Worthy Keeler PA-C Entered By: Worthy Keeler on 05/11/2020 16:53:09 Sharon [name]  ([id number]) -------------------------------------------------------------------------------- ROS/PFSH Details Patient Name: Sharon [name] Date of Service: 05/11/2020 12:45 PM Medical Record Number: [id number] Patient Account Number: [id number] Date of Birth/Sex: 01-08-1956 (64 y.o. F) Treating RN: Dolan Amen Primary Care Provider: Elvia Collum Other Clinician: Referring Provider: Elvia Collum Treating Provider/Extender: Skipper Cliche in Treatment: 0 Information Obtained From Patient Constitutional Symptoms (General Health) Complaints and Symptoms: Negative for: Fatigue; Fever; Chills; Marked Weight Change Eyes Complaints and Symptoms: Positive for: Glasses / Contacts Negative for: Dry Eyes; Vision Changes Medical History: Negative for: Cataracts; Glaucoma; Optic Neuritis Ear/Nose/Mouth/Throat Complaints and Symptoms: Negative for: Difficult clearing ears; Sinusitis Medical History: Negative for: Chronic sinus problems/congestion; Middle ear problems Hematologic/Lymphatic Complaints and Symptoms: Negative for: Bleeding / Clotting Disorders; Human Immunodeficiency Virus Medical History: Negative for: Anemia; Hemophilia; Human Immunodeficiency Virus; Lymphedema; Sickle Cell Disease Respiratory Complaints and Symptoms: Negative for: Chronic or frequent coughs; Shortness of Breath Medical History: Negative for: Aspiration; Asthma; Chronic Obstructive Pulmonary Disease (COPD); Pneumothorax; Sleep Apnea; Tuberculosis Past Medical History Notes: alpha gal d/t tick bite that caused 'adult asthma' Cardiovascular Complaints and Symptoms: Negative for: Chest pain; LE edema Medical History: Negative for: Angina; Arrhythmia; Congestive Heart Failure; Coronary Artery Disease; Deep Vein Thrombosis; Hypertension; Hypotension; Myocardial Infarction; Peripheral Arterial Disease; Peripheral Venous Disease; Phlebitis; Vasculitis Gastrointestinal Complaints and  Symptoms: Negative for: Frequent diarrhea; Nausea; Vomiting Medical History: Negative for: Cirrhosis ; Colitis; Crohnos; Hepatitis A; Hepatitis B; Hepatitis C Endocrine Sharon [name], KUNST. ([id number]) Complaints and Symptoms: Negative for: Hepatitis; Thyroid disease; Polydypsia (Excessive Thirst) Medical History: Negative for: Type I Diabetes; Type II Diabetes Genitourinary Complaints and Symptoms: Negative for: Kidney failure/ Dialysis; Incontinence/dribbling Medical History: Negative for: End Stage Renal Disease Immunological Complaints and Symptoms: Negative for: Hives; Itching Medical History: Negative for: Lupus Erythematosus; Raynaudos; Scleroderma Integumentary (Skin) Complaints and Symptoms: Positive for: Wounds Negative for: Bleeding or bruising tendency; Breakdown; Swelling Medical History: Negative for: History of Burn; History of pressure wounds Musculoskeletal Complaints and Symptoms: Positive for: Muscle Pain; Muscle Weakness Medical History: Positive for: Osteoarthritis Negative for: Gout; Rheumatoid Arthritis; Osteomyelitis Neurologic Complaints and Symptoms: Negative for: Numbness/parasthesias; Focal/Weakness Medical History: Negative for: Dementia; Neuropathy; Quadriplegia; Paraplegia; Seizure Disorder Psychiatric Complaints and Symptoms: Negative for: Anxiety; Claustrophobia  Medical History: Negative for: Anorexia/bulimia; Confinement Anxiety Oncologic Medical History: Negative for: Received Chemotherapy; Received Radiation Past Medical History Notes: hx of melanoma Immunizations Pneumococcal Vaccine: Received Pneumococcal Vaccination: Yes Implantable Devices None Sharon [name], Sharon [name] ([id number]) Hospitalization / Surgery History Type of Hospitalization/Surgery total knees hysterectomy appendectomy tonsillectomy cholesectomy left hip replacement Family and Social History Cancer: Yes - Mother,Father; Diabetes: Yes - Maternal Grandparents;  Heart Disease: No; Hereditary Spherocytosis: No; Hypertension: Yes - Father; Kidney Disease: No; Lung Disease: Yes - Father; Seizures: No; Stroke: Yes - Father; Thyroid Problems: Yes - Maternal Grandparents; Tuberculosis: No; Never smoker; Marital Status - Divorced; Alcohol Use: Moderate; Drug Use: No History; Caffeine Use: Daily - coffee Electronic Signature(s) Signed: 05/11/2020 4:55:27 PM By: Worthy Keeler PA-C Signed: 05/11/2020 5:08:10 PM By: Georges Mouse, Minus Breeding RN Entered By: Georges Mouse, Kenia on 05/11/2020 13:14:22 Sharon [name] ([id number]) -------------------------------------------------------------------------------- SuperBill Details Patient Name: Sharon [name]. Date of Service: 05/11/2020 Medical Record Number: [id number] Patient Account Number: [id number] Date of Birth/Sex: 09-18-1955 (64 y.o. 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 Facility Procedures CPT4 Code: 15400867 Description: 99214 - WOUND CARE VISIT-LEV 4 EST PT Modifier: Quantity: 1 Physician Procedures CPT4 Code Description: 6195093 26712 - WC PHYS LEVEL 4 - NEW PT Modifier: Quantity: 1 CPT4 Code Description: ICD-10 Diagnosis Description T81.31XA Disruption of external operation (surgical) wound, not elsewhere classified 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

## 2020-05-12 NOTE — Progress Notes (Signed)
RAFAELITA, FOISTER ([id number]) Visit Report for 05/11/2020 Abuse/Suicide Risk Screen Details Patient Name: Sharon [name], Sharon [name]. Date of Service: 05/11/2020 12:45 PM Medical Record Number: [id number] Patient Account Number: [id number] Date of Birth/Sex: 09-24-1955 (64 y.o. F) Treating RN: Dolan Amen Primary Care Vahe Pienta: Elvia Collum Other Clinician: Referring Yogesh Cominsky: Elvia Collum Treating Tulio Facundo/Extender: Skipper Cliche in Treatment: 0 Abuse/Suicide Risk Screen Items Answer ABUSE RISK SCREEN: Has anyone close to you tried to hurt or harm you recentlyo No Do you feel uncomfortable with anyone in your familyo No Has anyone forced you do things that you didnot want to doo No Electronic Signature(s) Signed: 05/11/2020 5:08:10 PM By: Georges Mouse, Minus Breeding RN Entered By: Georges Mouse, Kenia on 05/11/2020 13:14:31 Sharon [name] ([id number]) -------------------------------------------------------------------------------- Activities of Daily Living Details Patient Name: Sharon [name]. Date of Service: 05/11/2020 12:45 PM Medical Record Number: [id number] Patient Account Number: [id number] Date of Birth/Sex: 01-Mar-1956 (64 y.o. F) Treating RN: Dolan Amen Primary Care Nori Poland: Elvia Collum Other Clinician: Referring Aurilla Coulibaly: Elvia Collum Treating Federick Levene/Extender: Skipper Cliche in Treatment: 0 Activities of Daily Living Items Answer Activities of Daily Living (Please select one for each item) Drive Automobile Not Able Take Medications Completely Able Use Telephone Completely Able Care for Appearance Completely Able Use Toilet Completely Able Bath / Shower Completely Able Dress Self Completely Able Feed Self Completely Able Walk Completely Able Get In / Out Bed Completely Able Housework Completely Able Prepare Meals Completely [place] for Self Completely Able Electronic Signature(s) Signed: 05/11/2020 5:08:10  PM By: Georges Mouse, Minus Breeding RN Entered By: Georges Mouse, Kenia on 05/11/2020 13:14:54 Sharon [name] ([id number]) -------------------------------------------------------------------------------- Education Screening Details Patient Name: Sharon [name]. Date of Service: 05/11/2020 12:45 PM Medical Record Number: [id number] Patient Account Number: [id number] Date of Birth/Sex: Nov 17, 1955 (65 y.o. F) Treating RN: Dolan Amen Primary Care Christen Wardrop: Elvia Collum Other Clinician: Referring Jahzir Strohmeier: Elvia Collum Treating Tameika Heckmann/Extender: Skipper Cliche in Treatment: 0 Primary Learner Assessed: Patient Learning Preferences/Education Level/Primary Language Learning Preference: Explanation, Demonstration Highest Education Level: College or Above Preferred Language: English Cognitive Barrier Language Barrier: No Translator Needed: No Memory Deficit: No Emotional Barrier: No Cultural/Religious Beliefs Affecting Medical Care: No Physical Barrier Impaired Vision: Yes Glasses Impaired Hearing: No Decreased Hand dexterity: No Knowledge/Comprehension Knowledge Level: High Comprehension Level: High Ability to understand written instructions: High Ability to understand verbal instructions: High Motivation Anxiety Level: Calm Cooperation: Cooperative Education Importance: Acknowledges Need Interest in Health Problems: Asks Questions Perception: Coherent Willingness to Engage in Self-Management High Activities: Readiness to Engage in Self-Management High Activities: Electronic Signature(s) Signed: 05/11/2020 5:08:10 PM By: Georges Mouse, Minus Breeding RN Entered By: Georges Mouse, Minus Breeding on 05/11/2020 13:15:29 Sharon [name] ([id number]) -------------------------------------------------------------------------------- Fall Risk Assessment Details Patient Name: Sharon [name]. Date of Service: 05/11/2020 12:45 PM Medical Record Number: [id number] Patient  Account Number: [id number] Date of Birth/Sex: Mar 28, 1956 (64 y.o. F) Treating RN: Dolan Amen Primary Care Loris Seelye: Elvia Collum Other Clinician: Referring Woodson Macha: Elvia Collum Treating Nikoletta Varma/Extender: Skipper Cliche in Treatment: 0 Fall Risk Assessment Items Have you had 2 or more falls in the last 12 monthso 0 Yes Have you had any fall that resulted in injury in the last 12 monthso 0 No FALLS RISK SCREEN History of falling - immediate or within 3 months 25 Yes Secondary diagnosis (Do you have 2 or more medical diagnoseso) 0 No Ambulatory aid None/bed rest/wheelchair/nurse 0 Yes Crutches/cane/walker 15 Yes Furniture 0 No Intravenous therapy Access/Saline/Heparin Lock 0 No Gait/Transferring Normal/ bed  rest/ wheelchair 0 Yes Weak (short steps with or without shuffle, stooped but able to lift head while walking, may 0 No seek support from furniture) Impaired (short steps with shuffle, may have difficulty arising from chair, head down, impaired 0 No balance) Mental Status Oriented to own ability 0 Yes Electronic Signature(s) Signed: 05/11/2020 5:08:10 PM By: Georges Mouse, Minus Breeding RN Entered By: Georges Mouse, Kenia on 05/11/2020 13:16:23 Sharon [name] ([id number]) -------------------------------------------------------------------------------- Foot Assessment Details Patient Name: Sharon [name]. Date of Service: 05/11/2020 12:45 PM Medical Record Number: [id number] Patient Account Number: [id number] Date of Birth/Sex: August 02, 1955 (64 y.o. F) Treating RN: Dolan Amen Primary Care Graciano Batson: Elvia Collum Other Clinician: Referring Diamonte Stavely: Elvia Collum Treating Rashidi Loh/Extender: Skipper Cliche in Treatment: 0 Foot Assessment Items Site Locations + = Sensation present, - = Sensation absent, C = Callus, U = Ulcer R = Redness, W = Warmth, M = Maceration, PU = Pre-ulcerative lesion F = Fissure, S = Swelling, D = Dryness Assessment Right:  Left: Other Deformity: No No Prior Foot Ulcer: No No Prior Amputation: No No Charcot Joint: No No Ambulatory Status: Ambulatory With Help Assistance Device: Walker Gait: Steady Electronic Signature(s) Signed: 05/11/2020 5:08:10 PM By: Georges Mouse, Minus Breeding RN Entered By: Georges Mouse, Minus Breeding on 05/11/2020 13:16:54 Sharon [name] ([id number]) -------------------------------------------------------------------------------- Nutrition Risk Screening Details Patient Name: Sharon [name]. Date of Service: 05/11/2020 12:45 PM Medical Record Number: [id number] Patient Account Number: [id number] Date of Birth/Sex: [birth year] (64 y.o. F) Treating RN: Dolan Amen Primary Care Mattisyn Cardona: Elvia Collum Other Clinician: Referring Alexah Kivett: Elvia Collum Treating Jaklyn Alen/Extender: Skipper Cliche in Treatment: 0 Height (in): Weight (lbs): Body Mass Index (BMI): Nutrition Risk Screening Items Score Screening NUTRITION RISK SCREEN: I have an illness or condition that made me change the kind and/or amount of food I eat 0 No I eat fewer than two meals per day 0 No I eat few fruits and vegetables, or milk products 0 No I have three or more drinks of beer, liquor or wine almost every day 0 No I have tooth or mouth problems that make it hard for me to eat 0 No I don't always have enough money to buy the food I need 0 No I eat alone most of the time 0 No I take three or more different prescribed or over-the-counter drugs a day 0 No Without wanting to, I have lost or gained 10 pounds in the last six months 0 No I am not always physically able to shop, cook and/or feed myself 0 No Nutrition Protocols Good Risk Protocol 0 No interventions needed Moderate Risk Protocol High Risk Proctocol Risk Level: Good Risk Score: 0 Electronic Signature(s) Signed: 05/11/2020 5:08:10 PM By: Georges Mouse, Minus Breeding RN Entered By: Georges Mouse, Minus Breeding on 05/11/2020 13:16:37

## 2020-05-12 NOTE — Progress Notes (Signed)
Sharon, [name] ([id number]) Visit Report for 05/11/2020 Allergy List Details Patient Name: Sharon [name], Sharon [name]. Date of Service: 05/11/2020 12:45 PM Medical Record Number: [id number] Patient Account Number: [id number] Date of Birth/Sex: 1956/02/04 (64 y.o. F) Treating RN: Dolan Amen Primary Care Paton Crum: Elvia Collum Other Clinician: Referring Roxann Vierra: Elvia Collum Treating Nita Whitmire/Extender: Jeri Cos Weeks in Treatment: 0 Allergies Active Allergies Augmentin Reaction: Diarrhea Severity: Moderate neomycin Reaction: hives adhesive Severity: Moderate latex Reaction: hives Allergy Notes Electronic Signature(s) Signed: 05/11/2020 5:08:10 PM By: Georges Mouse, Minus Breeding RN Entered By: Georges Mouse, Minus Breeding on 05/11/2020 13:05:02 Sharon [name] ([id number]) -------------------------------------------------------------------------------- Arrival Information Details Patient Name: Sharon [name]. Date of Service: 05/11/2020 12:45 PM Medical Record Number: [id number] Patient Account Number: [id number] Date of Birth/Sex: 1955-09-06 (64 y.o. F) Treating RN: Dolan Amen Primary Care Calinda Stockinger: Elvia Collum Other Clinician: Referring Lorren Splawn: Elvia Collum Treating Nolin Grell/Extender: Skipper Cliche in Treatment: 0 Visit Information Patient Arrived: Walker Arrival Time: 12:57 Accompanied By: daughter Transfer Assistance: None Patient Identification Verified: Yes Secondary Verification Process Completed: Yes Electronic Signature(s) Signed: 05/11/2020 5:08:10 PM By: Georges Mouse, Minus Breeding RN Entered By: Georges Mouse, Minus Breeding on 05/11/2020 12:58:01 Sharon [name] ([id number]) -------------------------------------------------------------------------------- Clinic Level of Care Assessment Details Patient Name: Sharon [name]. Date of Service: 05/11/2020 12:45 PM Medical Record Number: [id number] Patient Account Number: [id number] Date of Birth/Sex:  1955/12/09 (64 y.o. F) Treating RN: Cornell Barman Primary Care Jailen Coward: Elvia Collum Other Clinician: Referring Usama Harkless: Elvia Collum Treating Jamilla Galli/Extender: Skipper Cliche in Treatment: 0 Clinic Level of Care Assessment Items TOOL 2 Quantity Score []  - Use when only an EandM is performed on the INITIAL visit 0 ASSESSMENTS - Nursing Assessment / Reassessment X - General Physical Exam (combine w/ comprehensive assessment (listed just below) when performed on new 1 20 pt. evals) X- 1 25 Comprehensive Assessment (HX, ROS, Risk Assessments, Wounds Hx, etc.) ASSESSMENTS - Wound and Skin Assessment / Reassessment X - Simple Wound Assessment / Reassessment - one wound 1 5 []  - 0 Complex Wound Assessment / Reassessment - multiple wounds []  - 0 Dermatologic / Skin Assessment (not related to wound area) ASSESSMENTS - Ostomy and/or Continence Assessment and Care []  - Incontinence Assessment and Management 0 []  - 0 Ostomy Care Assessment and Management (repouching, etc.) PROCESS - Coordination of Care X - Simple Patient / Family Education for ongoing care 1 15 []  - 0 Complex (extensive) Patient / Family Education for ongoing care X- 1 10 Staff obtains Programmer, systems, Records, Test Results / Process Orders []  - 0 Staff telephones HHA, Nursing Homes / Clarify orders / etc []  - 0 Routine Transfer to another Facility (non-emergent condition) []  - 0 Routine Hospital Admission (non-emergent condition) X- 1 15 New Admissions / Biomedical engineer / Ordering NPWT, Apligraf, etc. []  - 0 Emergency Hospital Admission (emergent condition) X- 1 10 Simple Discharge Coordination []  - 0 Complex (extensive) Discharge Coordination PROCESS - Special Needs []  - Pediatric / Minor Patient Management 0 []  - 0 Isolation Patient Management []  - 0 Hearing / Language / Visual special needs []  - 0 Assessment of Community assistance (transportation, D/C planning, etc.) []  - 0 Additional  assistance / Altered mentation []  - 0 Support Surface(s) Assessment (bed, cushion, seat, etc.) INTERVENTIONS - Wound Cleansing / Measurement X - Wound Imaging (photographs - any number of wounds) 1 5 []  - 0 Wound Tracing (instead of photographs) X- 1 5 Simple Wound Measurement - one wound []  - 0 Complex Wound Measurement - multiple wounds CAYLYN, TEDESCHI. ([id number])  X- 1 5 Simple Wound Cleansing - one wound []  - 0 Complex Wound Cleansing - multiple wounds INTERVENTIONS - Wound Dressings []  - Small Wound Dressing one or multiple wounds 0 []  - 0 Medium Wound Dressing one or multiple wounds X- 1 20 Large Wound Dressing one or multiple wounds []  - 0 Application of Medications - injection INTERVENTIONS - Miscellaneous []  - External ear exam 0 X- 1 5 Specimen Collection (cultures, biopsies, blood, body fluids, etc.) X- 1 5 Specimen(s) / Culture(s) sent or taken to Lab for analysis []  - 0 Patient Transfer (multiple staff / Civil Service fast streamer / Similar devices) []  - 0 Simple Staple / Suture removal (25 or less) []  - 0 Complex Staple / Suture removal (26 or more) []  - 0 Hypo / Hyperglycemic Management (close monitor of Blood Glucose) []  - 0 Ankle / Brachial Index (ABI) - do not check if billed separately Has the patient been seen at the hospital within the last three years: Yes Total Score: 145 Level Of Care: New/Established - Level 4 Electronic Signature(s) Signed: 05/11/2020 5:16:15 PM By: Gretta Cool, BSN, RN, CWS, Kim RN, BSN Entered By: Gretta Cool, BSN, RN, CWS, Kim on 05/11/2020 17:06:24 Sharon [name] ([id number]) -------------------------------------------------------------------------------- Encounter Discharge Information Details Patient Name: Sharon [name]. Date of Service: 05/11/2020 12:45 PM Medical Record Number: [id number] Patient Account Number: [id number] Date of Birth/Sex: Dec 31, 1955 (64 y.o. F) Treating RN: Cornell Barman Primary Care Franca Stakes: Elvia Collum Other  Clinician: Referring Callin Ashe: Elvia Collum Treating Lailani Tool/Extender: Skipper Cliche in Treatment: 0 Encounter Discharge Information Items Discharge Condition: Stable Ambulatory Status: Ambulatory Discharge Destination: Home Transportation: Private Auto Accompanied By: daughter Schedule Follow-up Appointment: Yes Clinical Summary of Care: Electronic Signature(s) Signed: 05/11/2020 5:12:11 PM By: Gretta Cool, BSN, RN, CWS, Kim RN, BSN Entered By: Gretta Cool, BSN, RN, CWS, Kim on 05/11/2020 17:12:11 Sharon [name] ([id number]) -------------------------------------------------------------------------------- Lower Extremity Assessment Details Patient Name: Sharon [name]. Date of Service: 05/11/2020 12:45 PM Medical Record Number: [id number] Patient Account Number: [id number] Date of Birth/Sex: 1955-09-07 (64 y.o. F) Treating RN: Dolan Amen Primary Care Kanen Mottola: Elvia Collum Other Clinician: Referring Elver Stadler: Elvia Collum Treating Kyjuan Gause/Extender: Jeri Cos Weeks in Treatment: 0 Electronic Signature(s) Signed: 05/11/2020 5:08:10 PM By: Georges Mouse, Minus Breeding RN Entered By: Georges Mouse, Minus Breeding on 05/11/2020 13:29:49 Sharon [name] ([id number]) -------------------------------------------------------------------------------- Multi Wound Chart Details Patient Name: Sharon [name]. Date of Service: 05/11/2020 12:45 PM Medical Record Number: [id number] Patient Account Number: [id number] Date of Birth/Sex: [birth year] (64 y.o. F) Treating RN: Dolan Amen Primary Care Eather Chaires: Elvia Collum Other Clinician: Referring Nakita Santerre: Elvia Collum Treating Kiren Mcisaac/Extender: Skipper Cliche in Treatment: 0 Vital Signs Height(in): Pulse(bpm): 31 Weight(lbs): Blood Pressure(mmHg): 124/72 Body Mass Index(BMI): Temperature(F): 98.7 Respiratory Rate(breaths/min): 18 Photos: [N/A:N/A] Wound Location: Left Groin N/A N/A Wounding Event: Surgical Injury N/A  N/A Primary Etiology: Open Surgical Wound N/A N/A Comorbid History: Osteoarthritis N/A N/A Date Acquired: 05/03/2020 N/A N/A Weeks of Treatment: 0 N/A N/A Wound Status: Open N/A N/A Measurements L x W x D (cm) 2x2.2x1.4 N/A N/A Area (cm) : 3.456 N/A N/A Volume (cm) : 4.838 N/A N/A % Reduction in Area: 0.00% N/A N/A % Reduction in Volume: 0.00% N/A N/A Classification: Full Thickness With Exposed N/A N/A Support Structures Exudate Amount: Medium N/A N/A Exudate Type: Sanguinous N/A N/A Exudate Color: red N/A N/A Granulation Amount: Large (67-100%) N/A N/A Granulation Quality: Red, Hyper-granulation N/A N/A Necrotic Amount: None Present (0%) N/A N/A Exposed Structures: Fat Layer (Subcutaneous Tissue): N/A N/A Yes Muscle: Yes Fascia: No  Tendon: No Joint: No Bone: No Epithelialization: None N/A N/A Treatment Notes CAYLAN, CHENARD ([id number]) Electronic Signature(s) Signed: 05/11/2020 5:08:10 PM By: Georges Mouse, Minus Breeding RN Entered By: Georges Mouse, Kenia on 05/11/2020 13:40:30 Sharon [name] ([id number]) -------------------------------------------------------------------------------- [place] Details Patient Name: Sharon [name]. Date of Service: 05/11/2020 12:45 PM Medical Record Number: [id number] Patient Account Number: [id number] Date of Birth/Sex: 05-27-1956 (64 y.o. F) Treating RN: Dolan Amen Primary Care Nicholson Starace: Elvia Collum Other Clinician: Referring Amylah Will: Elvia Collum Treating Dareen Gutzwiller/Extender: Skipper Cliche in Treatment: 0 Active Inactive Wound/Skin Impairment Nursing Diagnoses: Impaired tissue integrity Goals: Patient/caregiver will verbalize understanding of skin care regimen Date Initiated: 05/11/2020 Target Resolution Date: 06/11/2020 Goal Status: Active Ulcer/skin breakdown will have a volume reduction of 30% by week 4 Date Initiated: 05/11/2020 Target Resolution Date: 06/11/2020 Goal Status:  Active Interventions: Assess patient/caregiver ability to obtain necessary supplies Assess patient/caregiver ability to perform ulcer/skin care regimen upon admission and as needed Assess ulceration(s) every visit Provide education on ulcer and skin care Treatment Activities: Skin care regimen initiated : 05/11/2020 Notes: Electronic Signature(s) Signed: 05/11/2020 5:08:10 PM By: Georges Mouse, Minus Breeding RN Entered By: Georges Mouse, Minus Breeding on 05/11/2020 13:40:20 Sharon [name] ([id number]) -------------------------------------------------------------------------------- Pain Assessment Details Patient Name: Sharon [name]. Date of Service: 05/11/2020 12:45 PM Medical Record Number: [id number] Patient Account Number: [id number] Date of Birth/Sex: 09-19-1955 (64 y.o. F) Treating RN: Dolan Amen Primary Care Javontae Marlette: Elvia Collum Other Clinician: Referring Diandra Cimini: Elvia Collum Treating Jonay Hitchcock/Extender: Skipper Cliche in Treatment: 0 Active Problems Location of Pain Severity and Description of Pain Patient Has Paino Yes Site Locations Pain Location: Generalized Pain Rate the pain. Current Pain Level: 6 Pain Management and Medication Current Pain Management: Electronic Signature(s) Signed: 05/11/2020 5:08:10 PM By: Georges Mouse, Minus Breeding RN Entered By: Georges Mouse, Kenia on 05/11/2020 13:01:59 Sharon [name] ([id number]) -------------------------------------------------------------------------------- Patient/Caregiver Education Details Patient Name: Sharon [name] Date of Service: 05/11/2020 12:45 PM Medical Record Number: [id number] Patient Account Number: [id number] Date of Birth/Gender: [birth year] (64 y.o. F) Treating RN: Cornell Barman Primary Care Physician: Elvia Collum Other Clinician: Referring Physician: Elvia Collum Treating Physician/Extender: Skipper Cliche in Treatment: 0 Education Assessment Education Provided  To: Patient Education Topics Provided Wound/Skin Impairment: Handouts: Caring for Your Ulcer, Skin Care Do's and Dont's, Other: wound care as prescribed Methods: Demonstration, Explain/Verbal Responses: State content correctly Electronic Signature(s) Signed: 05/11/2020 5:16:15 PM By: Gretta Cool, BSN, RN, CWS, Kim RN, BSN Entered By: Gretta Cool, BSN, RN, CWS, Kim on 05/11/2020 17:08:14 Sharon [name] ([id number]) -------------------------------------------------------------------------------- Wound Assessment Details Patient Name: Sharon [name]. Date of Service: 05/11/2020 12:45 PM Medical Record Number: [id number] Patient Account Number: [id number] Date of Birth/Sex: 07-31-1955 (64 y.o. F) Treating RN: Dolan Amen Primary Care Rydge Texidor: Elvia Collum Other Clinician: Referring Latorsha Curling: Elvia Collum Treating Erric Machnik/Extender: Skipper Cliche in Treatment: 0 Wound Status Wound Number: 1 Primary Etiology: Open Surgical Wound Wound Location: Left Groin Wound Status: Open Wounding Event: Surgical Injury Comorbid History: Osteoarthritis Date Acquired: 05/03/2020 Weeks Of Treatment: 0 Clustered Wound: No Photos Wound Measurements Length: (cm) 2 Width: (cm) 2.2 Depth: (cm) 1.4 Area: (cm) 3.456 Volume: (cm) 4.838 % Reduction in Area: 0% % Reduction in Volume: 0% Epithelialization: None Tunneling: Yes Position (o'clock): 6 Maximum Distance: (cm) 1.4 Wound Description Classification: Full Thickness With Exposed Support Structur Exudate Amount: Medium Exudate Type: Sanguinous Exudate Color: red es Foul Odor After Cleansing: No Slough/Fibrino No Wound Bed Granulation Amount: Large (67-100%) Exposed Structure Granulation Quality: Red, Hyper-granulation Fascia Exposed: No  Necrotic Amount: None Present (0%) Fat Layer (Subcutaneous Tissue) Exposed: Yes Tendon Exposed: No Muscle Exposed: Yes Necrosis of Muscle: No Joint Exposed: No Bone Exposed: No Treatment  Notes Wound #1 (Left Groin) Notes Packing strip lightly into tunnels, silvercell, abd, tegaderm to secure KYA, MAYFIELD ([id number]) Electronic Signature(s) Signed: 05/11/2020 5:08:10 PM By: Georges Mouse, Minus Breeding RN Entered By: Georges Mouse, Minus Breeding on 05/11/2020 13:44:17 Sharon [name] ([id number]) -------------------------------------------------------------------------------- Vitals Details Patient Name: Sharon [name]. Date of Service: 05/11/2020 12:45 PM Medical Record Number: [id number] Patient Account Number: [id number] Date of Birth/Sex: 20-Aug-1955 (64 y.o. F) Treating RN: Dolan Amen Primary Care Reinhardt Licausi: Elvia Collum Other Clinician: Referring Orlen Leedy: Elvia Collum Treating Martie Muhlbauer/Extender: Skipper Cliche in Treatment: 0 Vital Signs Time Taken: 13:02 Temperature (F): 98.7 Pulse (bpm): 86 Respiratory Rate (breaths/min): 18 Blood Pressure (mmHg): 124/72 Reference Range: 80 - 120 mg / dl Electronic Signature(s) Signed: 05/11/2020 5:08:10 PM By: Georges Mouse, Minus Breeding RN Entered By: Georges Mouse, Minus Breeding on 05/11/2020 13:02:19

## 2020-05-14 LAB — AEROBIC CULTURE W GRAM STAIN (SUPERFICIAL SPECIMEN)

## 2020-05-15 DIAGNOSIS — M65879 Other synovitis and tenosynovitis, unspecified ankle and foot: Secondary | ICD-10-CM | POA: Diagnosis not present

## 2020-05-15 DIAGNOSIS — J4520 Mild intermittent asthma, uncomplicated: Secondary | ICD-10-CM | POA: Diagnosis not present

## 2020-05-15 DIAGNOSIS — M7061 Trochanteric bursitis, right hip: Secondary | ICD-10-CM | POA: Diagnosis not present

## 2020-05-15 DIAGNOSIS — G43909 Migraine, unspecified, not intractable, without status migrainosus: Secondary | ICD-10-CM | POA: Diagnosis not present

## 2020-05-15 DIAGNOSIS — G5603 Carpal tunnel syndrome, bilateral upper limbs: Secondary | ICD-10-CM | POA: Diagnosis not present

## 2020-05-15 DIAGNOSIS — J4540 Moderate persistent asthma, uncomplicated: Secondary | ICD-10-CM | POA: Diagnosis not present

## 2020-05-15 DIAGNOSIS — Z86010 Personal history of colonic polyps: Secondary | ICD-10-CM | POA: Diagnosis not present

## 2020-05-15 DIAGNOSIS — M5137 Other intervertebral disc degeneration, lumbosacral region: Secondary | ICD-10-CM | POA: Diagnosis not present

## 2020-05-15 DIAGNOSIS — Z87442 Personal history of urinary calculi: Secondary | ICD-10-CM | POA: Diagnosis not present

## 2020-05-15 DIAGNOSIS — G8929 Other chronic pain: Secondary | ICD-10-CM | POA: Diagnosis not present

## 2020-05-15 DIAGNOSIS — Z96642 Presence of left artificial hip joint: Secondary | ICD-10-CM | POA: Diagnosis not present

## 2020-05-15 DIAGNOSIS — M1611 Unilateral primary osteoarthritis, right hip: Secondary | ICD-10-CM | POA: Diagnosis not present

## 2020-05-15 DIAGNOSIS — K760 Fatty (change of) liver, not elsewhere classified: Secondary | ICD-10-CM | POA: Diagnosis not present

## 2020-05-15 DIAGNOSIS — K5790 Diverticulosis of intestine, part unspecified, without perforation or abscess without bleeding: Secondary | ICD-10-CM | POA: Diagnosis not present

## 2020-05-15 DIAGNOSIS — M5127 Other intervertebral disc displacement, lumbosacral region: Secondary | ICD-10-CM | POA: Diagnosis not present

## 2020-05-15 DIAGNOSIS — Z85828 Personal history of other malignant neoplasm of skin: Secondary | ICD-10-CM | POA: Diagnosis not present

## 2020-05-15 DIAGNOSIS — Z6841 Body Mass Index (BMI) 40.0 and over, adult: Secondary | ICD-10-CM | POA: Diagnosis not present

## 2020-05-15 DIAGNOSIS — M5410 Radiculopathy, site unspecified: Secondary | ICD-10-CM | POA: Diagnosis not present

## 2020-05-15 DIAGNOSIS — N393 Stress incontinence (female) (male): Secondary | ICD-10-CM | POA: Diagnosis not present

## 2020-05-15 DIAGNOSIS — E669 Obesity, unspecified: Secondary | ICD-10-CM | POA: Diagnosis not present

## 2020-05-15 DIAGNOSIS — Z471 Aftercare following joint replacement surgery: Secondary | ICD-10-CM | POA: Diagnosis not present

## 2020-05-15 DIAGNOSIS — Z87891 Personal history of nicotine dependence: Secondary | ICD-10-CM | POA: Diagnosis not present

## 2020-05-15 DIAGNOSIS — K219 Gastro-esophageal reflux disease without esophagitis: Secondary | ICD-10-CM | POA: Diagnosis not present

## 2020-05-15 DIAGNOSIS — I10 Essential (primary) hypertension: Secondary | ICD-10-CM | POA: Diagnosis not present

## 2020-05-15 DIAGNOSIS — Z85820 Personal history of malignant melanoma of skin: Secondary | ICD-10-CM | POA: Diagnosis not present

## 2020-05-17 ENCOUNTER — Encounter: Payer: Self-pay | Admitting: Orthopaedic Surgery

## 2020-05-17 ENCOUNTER — Ambulatory Visit (INDEPENDENT_AMBULATORY_CARE_PROVIDER_SITE_OTHER): Payer: PPO | Admitting: Orthopaedic Surgery

## 2020-05-17 ENCOUNTER — Other Ambulatory Visit: Payer: Self-pay

## 2020-05-17 ENCOUNTER — Ambulatory Visit: Payer: PPO | Admitting: Skilled Nursing Facility1

## 2020-05-17 DIAGNOSIS — T8131XD Disruption of external operation (surgical) wound, not elsewhere classified, subsequent encounter: Secondary | ICD-10-CM

## 2020-05-17 DIAGNOSIS — Z96642 Presence of left artificial hip joint: Secondary | ICD-10-CM

## 2020-05-17 NOTE — Progress Notes (Signed)
The patient comes in today for continued follow-up status post a left total hip arthroplasty done just over 4 weeks ago.  She has had a breakdown of her proximal aspect of her incision.  She has been treated at wound care center now.  She reports much better mobility and much less pain postoperatively than what she was dealing with both before surgery.  I do have her on prophylactic doxycycline.  Examination her wound today shows no redness.  I was able to probe it and did remove some more seroma type of fluid from it.  There was good granulation tissue.  There is no redness or induration.  There is no foul odor.  I did talk over this week with the wound care specialist.  They were concerned about placing a VAC sponge in the wound since there were 2 areas that tunnel different.  I probed this area and the more superior area does not track deep in the more inferior tracks a little more deep.  There was no worrisome areas that are probed.  I do feel that a VAC would be appropriate to be placed at the wound center in terms of just 1 small part of the sponge tunneling at the inferior aspect and then just having 1 over the central part of the wound itself.  However, if they are not comfortable doing that, wound care that is being performed right now will still allow this to heal appropriately.  She does not need a return trip to the operating room.  She agrees with this treatment plan.  I did pack a silver cell type of dressing on it today.  I will see her back in 2 weeks for continued follow-up and assessment of her wound.  She will continue doxycycline as well.

## 2020-05-18 ENCOUNTER — Ambulatory Visit: Payer: PPO | Admitting: Skilled Nursing Facility1

## 2020-05-18 ENCOUNTER — Telehealth: Payer: Self-pay | Admitting: Orthopaedic Surgery

## 2020-05-18 ENCOUNTER — Encounter: Payer: PPO | Admitting: Physician Assistant

## 2020-05-18 DIAGNOSIS — L98495 Non-pressure chronic ulcer of skin of other sites with muscle involvement without evidence of necrosis: Secondary | ICD-10-CM | POA: Diagnosis not present

## 2020-05-18 DIAGNOSIS — T8131XA Disruption of external operation (surgical) wound, not elsewhere classified, initial encounter: Secondary | ICD-10-CM | POA: Diagnosis not present

## 2020-05-18 NOTE — Telephone Encounter (Signed)
Verbal order left on VM  

## 2020-05-18 NOTE — Telephone Encounter (Signed)
Therapist called and was requesting a Physical therapy extension for the pt. Please call her if you have any questions and she said it was okay  to leave a VM

## 2020-05-18 NOTE — Progress Notes (Signed)
ALZADA, BRAZEE ([id number]) Visit Report for 05/18/2020 Chief Complaint Document Details Patient Name: ALEANA, FIFITA. Date of Service: 05/18/2020 2:45 PM Medical Record Number: [id number] Patient Account Number: [id number] Date of Birth/Sex: 08-Jul-1955 (64 y.o. F) Treating RN: Cornell Barman Primary Care Provider: Elvia Collum Other Clinician: Referring Provider: Elvia Collum Treating Provider/Extender: Skipper Cliche in Treatment: 1 Information Obtained from: Patient Chief Complaint Surgical wound dehiscence Electronic Signature(s) Signed: 05/18/2020 3:01:40 PM By: Worthy Keeler PA-C Entered By: Worthy Keeler on 05/18/2020 15:01:40 [name] ([id number]) -------------------------------------------------------------------------------- HPI Details Patient Name: [name] Date of Service: 05/18/2020 2:45 PM Medical Record Number: [id number] Patient Account Number: [id number] Date of Birth/Sex: 1955/10/31 (64 y.o. F) Treating RN: Cornell Barman Primary Care Provider: Elvia Collum Other Clinician: Referring Provider: Elvia Collum Treating Provider/Extender: Skipper Cliche in Treatment: 1 History of Present Illness HPI Description: 05/11/2020 upon evaluation today patient appears to be doing somewhat poorly in regard to her wound on the left hip/groin area status post having had a total hip replacement on the left as performed by Dr. Jean Rosenthal. She was admitted to the hospital on 04/14/2020 discharged 04/15/2020. She does tell me that following the surgery she has done very well from the standpoint of pain issues he is no major issues with pain currently. With that being said unfortunately when the sutures were removed she tells me the wound dehisced and she has had issues since that time. She does have a wound that appears to show some signs of granulation with that being said unfortunately she also upon further evaluation appears to have a  network of tunneling and undermining underneath even this area of apparent granulation tissue in the central portion of the wound. This has me concerned as I feel like this area is getting need to be packed. If not I am afraid that she is going to continue to develop a seroma and potentially even develop into infection. I discussed this with the patient today. With that being said we will obviously initiate packing today in order to try to allow this to heal more appropriately. With regard to the wound VAC again I discussed with the patient that I am not sure that skin to be appropriate currently considering the significant undermining that was seen I am not certain that she would really get the benefit necessary currently. I am not concerned about the multiple openings externally I am more concerned about the internal tracking and tunneling that I think would be more of an issue. The patient voiced understanding. With that being said also told her that ongoing I discussed the situation with her surgeon as well and I did put in a call with him today I will discuss that in the plan. 05/18/2020 upon evaluation today patient actually appears to be doing better than I actually anticipated in 1 week's time. The wound itself after cleaning out and packing appropriately last week has made excellent improvement and very pleased in this regard. Fortunately there is no evidence of active infection locally or systemically which is excellent. No fevers, chills, nausea, vomiting, or diarrhea. Electronic Signature(s) Signed: 05/18/2020 3:11:27 PM By: Worthy Keeler PA-C Entered By: Worthy Keeler on 05/18/2020 15:11:27 [name] ([id number]) -------------------------------------------------------------------------------- Physical Exam Details Patient Name: [name] Date of Service: 05/18/2020 2:45 PM Medical Record Number: [id number] Patient Account Number: [id number] Date of Birth/Sex:  1955/07/06 (63 y.o. F) Treating RN: Cornell Barman Primary Care Provider: Elvia Collum Other Clinician: Referring  Provider: Elvia Collum Treating Provider/Extender: Skipper Cliche in Treatment: 1 Constitutional Well-nourished and well-hydrated in no acute distress. Respiratory normal breathing without difficulty. Psychiatric this patient is able to make decisions and demonstrates good insight into disease process. Alert and Oriented x 3. pleasant and cooperative. Notes Upon inspection patient's wound actually did show signs of greater and more significant improvement than what I have been envisioned after seeing this last week I feel like this has granulated in quite well especially the 12:00 location and at 6:00 it does not appear to be quite as open as it was there also no fluid pockets collected which is good news. This is something that we can keep a close eye on but nonetheless I am encouraged by what I see today. Electronic Signature(s) Signed: 05/18/2020 3:11:53 PM By: Worthy Keeler PA-C Entered By: Worthy Keeler on 05/18/2020 15:11:52 [name] ([id number]) -------------------------------------------------------------------------------- Physician Orders Details Patient Name: [name]. Date of Service: 05/18/2020 2:45 PM Medical Record Number: [id number] Patient Account Number: [id number] Date of Birth/Sex: [birth year] (64 y.o. 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 o Other: - May shower with dressing in place Primary Wound Dressing Wound #1 Left Groin o Silver Alginate - placed on top o Iodoform packing Gauze - 1/4 inch in tunnels Secondary Dressing Wound #1 Left Groin o ABD pad o Tegaderm Dressing Change Frequency Wound #1 Left Groin o Change dressing every day. Follow-up Appointments Wound #1 Left Groin o Return Appointment in 1 week. Electronic Signature(s) Signed: 05/18/2020 4:46:48 PM By: Worthy Keeler PA-C Entered By: Gretta Cool BSN, RN, CWS, Kim on 05/18/2020 15:07:51 [name] ([id number]) -------------------------------------------------------------------------------- Problem List Details Patient Name: [name]. Date of Service: 05/18/2020 2:45 PM Medical Record Number: [id number] Patient Account Number: [id number] Date of Birth/Sex: 10/04/1955 (64 y.o. 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 Date of Service: 05/18/2020 2:45 PM Medical Record Number: [id number] Patient Account Number: [id number] Date of Birth/Sex: Mar 21, 1956 (64 y.o. F) Treating RN: Cornell Barman Primary Care Provider: Elvia Collum  Other Clinician: Referring Provider: Elvia Collum Treating Provider/Extender: Skipper Cliche in Treatment: 1 Subjective Chief Complaint Information obtained from Patient Surgical wound dehiscence History of Present Illness (HPI) 05/11/2020 upon evaluation today patient appears to be doing somewhat poorly in regard to her wound on the left hip/groin area status post having had a total hip replacement on the left as performed by Dr. Jean Rosenthal. She was admitted to the hospital on 04/14/2020 discharged 04/15/2020. She does tell me that following the  surgery she has done very well from the standpoint of pain issues he is no major issues with pain currently. With that being said unfortunately when the sutures were removed she tells me the wound dehisced and she has had issues since that time. She does have a wound that appears to show some signs of granulation with that being said unfortunately she also upon further evaluation appears to have a network of tunneling and undermining underneath even this area of apparent granulation tissue in the central portion of the wound. This has me concerned as I feel like this area is getting need to be packed. If not I am afraid that she is going to continue to develop a seroma and potentially even develop into infection. I discussed this with the patient today. With that being said we will obviously initiate packing today in order to try to allow this to heal more appropriately. With regard to the wound VAC again I discussed with the patient that I am not sure that skin to be appropriate currently considering the significant undermining that was seen I am not certain that she would really get the benefit necessary currently. I am not concerned about the multiple openings externally I am more concerned about the internal tracking and tunneling that I think would be more of an issue. The patient voiced understanding. With that being said also told  her that ongoing I discussed the situation with her surgeon as well and I did put in a call with him today I will discuss that in the plan. 05/18/2020 upon evaluation today patient actually appears to be doing better than I actually anticipated in 1 week's time. The wound itself after cleaning out and packing appropriately last week has made excellent improvement and very pleased in this regard. Fortunately there is no evidence of active infection locally or systemically which is excellent. No fevers, chills, nausea, vomiting, or diarrhea. Objective Constitutional Well-nourished and well-hydrated in no acute distress. Vitals Time Taken: 2:50 PM, Temperature: 98.8 F, Pulse: 74 bpm, Respiratory Rate: 18 breaths/min, Blood Pressure: 143/83 mmHg. Respiratory normal breathing without difficulty. Psychiatric this patient is able to make decisions and demonstrates good insight into disease process. Alert and Oriented x 3. pleasant and cooperative. General Notes: Upon inspection patient's wound actually did show signs of greater and more significant improvement than what I have been envisioned after seeing this last week I feel like this has granulated in quite well especially the 12:00 location and at 6:00 it does not appear to be quite as open as it was there also no fluid pockets collected which is good news. This is something that we can keep a close eye on but nonetheless I am encouraged by what I see today. Integumentary (Hair, Skin) Wound #1 status is Open. Original cause of wound was Surgical Injury. The wound is located on the Left Groin. The wound measures 2cm length x 1.8cm width x 0.6cm depth; 2.827cm^2 area and 1.696cm^3 volume. There is Fat Layer (Subcutaneous Tissue) exposed. There is tunneling at 7:00 with a maximum distance of 2.8cm. There is a large amount of sanguinous drainage noted. There is large (67-100%) red granulation within the wound bed. There is no necrotic tissue within  the wound bed. 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 Other: - May shower with dressing in place Primary Wound Dressing: Wound #1 Left Groin: Silver Alginate - placed on top Iodoform packing Gauze - 1/4 inch in tunnels Secondary Dressing: Wound #1 Left Groin: ABD pad Tegaderm Dressing Change Frequency: Wound #1 Left Groin: Change dressing every day. Follow-up Appointments: Wound #1 Left Groin: Return Appointment in 1 week. 1. I would recommend that this point that we have the patient go ahead and continue with the wound care measures as before. We have been utilizing the iodoform packing gauze into the tunnels at 12:00 and 6:00 lightly but securely. 2. We can use silver alginate over top just to catch any excessive drainage followed by an ABD pad and Tegaderm to hold everything in place. 3. I am also can recommend the patient continue to monitor for any signs of infection such as redness, erythema, nausea, vomiting, diarrhea, fever, chills, and if any these occur she should go to the ER ASAP. She voiced understanding her daughter was present during this evaluation today. We will see patient back for reevaluation in 2 weeks here in the clinic. If anything worsens or changes patient will contact our office for additional recommendations. Electronic Signature(s) Signed: 05/18/2020 3:12:44 PM By: Worthy Keeler PA-C Entered By: Worthy Keeler on 05/18/2020 15:12:44 [name] ([id number]) -------------------------------------------------------------------------------- SuperBill Details Patient Name: [name]. Date of Service: 05/18/2020 Medical Record Number:  [id number] Patient Account Number: [id number] Date of Birth/Sex: 1956-02-26 (64 y.o. 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 Facility Procedures CPT4 Code: [id number] Description: 99213 - WOUND CARE VISIT-LEV 3 EST PT Modifier: Quantity: 1 Physician Procedures CPT4 Code Description: [id number] - WC PHYS LEVEL 4 - EST PT Modifier: Quantity: 1 CPT4 Code Description: ICD-10 Diagnosis Description T81.31XA Disruption of external operation (surgical) wound, not elsewhere classified 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

## 2020-05-22 ENCOUNTER — Other Ambulatory Visit: Payer: Self-pay

## 2020-05-22 ENCOUNTER — Ambulatory Visit (HOSPITAL_COMMUNITY)
Admission: RE | Admit: 2020-05-22 | Discharge: 2020-05-22 | Disposition: A | Discharge status: Home / Self Care | Payer: PPO | Source: Ambulatory Visit | Attending: Nurse Practitioner | Admitting: Nurse Practitioner

## 2020-05-22 ENCOUNTER — Other Ambulatory Visit (HOSPITAL_COMMUNITY): Payer: Self-pay | Admitting: Nurse Practitioner

## 2020-05-22 DIAGNOSIS — Z1231 Encounter for screening mammogram for malignant neoplasm of breast: Secondary | ICD-10-CM | POA: Diagnosis not present

## 2020-05-23 ENCOUNTER — Encounter: Payer: Self-pay | Admitting: Orthopaedic Surgery

## 2020-05-23 ENCOUNTER — Other Ambulatory Visit (HOSPITAL_COMMUNITY): Payer: Self-pay | Admitting: Nurse Practitioner

## 2020-05-23 ENCOUNTER — Other Ambulatory Visit: Payer: Self-pay | Admitting: Orthopaedic Surgery

## 2020-05-23 DIAGNOSIS — Z96642 Presence of left artificial hip joint: Secondary | ICD-10-CM | POA: Diagnosis not present

## 2020-05-23 DIAGNOSIS — Z87442 Personal history of urinary calculi: Secondary | ICD-10-CM | POA: Diagnosis not present

## 2020-05-23 DIAGNOSIS — G5603 Carpal tunnel syndrome, bilateral upper limbs: Secondary | ICD-10-CM | POA: Diagnosis not present

## 2020-05-23 DIAGNOSIS — K5790 Diverticulosis of intestine, part unspecified, without perforation or abscess without bleeding: Secondary | ICD-10-CM | POA: Diagnosis not present

## 2020-05-23 DIAGNOSIS — K219 Gastro-esophageal reflux disease without esophagitis: Secondary | ICD-10-CM | POA: Diagnosis not present

## 2020-05-23 DIAGNOSIS — M7061 Trochanteric bursitis, right hip: Secondary | ICD-10-CM | POA: Diagnosis not present

## 2020-05-23 DIAGNOSIS — J4520 Mild intermittent asthma, uncomplicated: Secondary | ICD-10-CM | POA: Diagnosis not present

## 2020-05-23 DIAGNOSIS — E669 Obesity, unspecified: Secondary | ICD-10-CM | POA: Diagnosis not present

## 2020-05-23 DIAGNOSIS — J4540 Moderate persistent asthma, uncomplicated: Secondary | ICD-10-CM | POA: Diagnosis not present

## 2020-05-23 DIAGNOSIS — R928 Other abnormal and inconclusive findings on diagnostic imaging of breast: Secondary | ICD-10-CM

## 2020-05-23 DIAGNOSIS — M5137 Other intervertebral disc degeneration, lumbosacral region: Secondary | ICD-10-CM | POA: Diagnosis not present

## 2020-05-23 DIAGNOSIS — G8929 Other chronic pain: Secondary | ICD-10-CM | POA: Diagnosis not present

## 2020-05-23 DIAGNOSIS — G43909 Migraine, unspecified, not intractable, without status migrainosus: Secondary | ICD-10-CM | POA: Diagnosis not present

## 2020-05-23 DIAGNOSIS — M5410 Radiculopathy, site unspecified: Secondary | ICD-10-CM | POA: Diagnosis not present

## 2020-05-23 DIAGNOSIS — Z87891 Personal history of nicotine dependence: Secondary | ICD-10-CM | POA: Diagnosis not present

## 2020-05-23 DIAGNOSIS — I10 Essential (primary) hypertension: Secondary | ICD-10-CM | POA: Diagnosis not present

## 2020-05-23 DIAGNOSIS — K760 Fatty (change of) liver, not elsewhere classified: Secondary | ICD-10-CM | POA: Diagnosis not present

## 2020-05-23 DIAGNOSIS — Z85828 Personal history of other malignant neoplasm of skin: Secondary | ICD-10-CM | POA: Diagnosis not present

## 2020-05-23 DIAGNOSIS — N393 Stress incontinence (female) (male): Secondary | ICD-10-CM | POA: Diagnosis not present

## 2020-05-23 DIAGNOSIS — Z85820 Personal history of malignant melanoma of skin: Secondary | ICD-10-CM | POA: Diagnosis not present

## 2020-05-23 DIAGNOSIS — Z6841 Body Mass Index (BMI) 40.0 and over, adult: Secondary | ICD-10-CM | POA: Diagnosis not present

## 2020-05-23 DIAGNOSIS — Z86010 Personal history of colonic polyps: Secondary | ICD-10-CM | POA: Diagnosis not present

## 2020-05-23 DIAGNOSIS — M65879 Other synovitis and tenosynovitis, unspecified ankle and foot: Secondary | ICD-10-CM | POA: Diagnosis not present

## 2020-05-23 DIAGNOSIS — Z471 Aftercare following joint replacement surgery: Secondary | ICD-10-CM | POA: Diagnosis not present

## 2020-05-23 DIAGNOSIS — M5127 Other intervertebral disc displacement, lumbosacral region: Secondary | ICD-10-CM | POA: Diagnosis not present

## 2020-05-23 DIAGNOSIS — M1611 Unilateral primary osteoarthritis, right hip: Secondary | ICD-10-CM | POA: Diagnosis not present

## 2020-05-23 MED ORDER — DOXYCYCLINE HYCLATE 100 MG PO TABS: 100.0000 mg | ORAL_TABLET | Freq: Two times a day (BID) | ORAL | 0 refills | Status: DC

## 2020-05-24 DIAGNOSIS — S71102A Unspecified open wound, left thigh, initial encounter: Secondary | ICD-10-CM | POA: Diagnosis not present

## 2020-05-25 ENCOUNTER — Other Ambulatory Visit: Payer: Self-pay

## 2020-05-25 ENCOUNTER — Ambulatory Visit (HOSPITAL_COMMUNITY)
Admission: RE | Admit: 2020-05-25 | Discharge: 2020-05-25 | Disposition: A | Discharge status: Home / Self Care | Payer: PPO | Source: Ambulatory Visit | Attending: Nurse Practitioner | Admitting: Nurse Practitioner

## 2020-05-25 DIAGNOSIS — R928 Other abnormal and inconclusive findings on diagnostic imaging of breast: Secondary | ICD-10-CM

## 2020-05-30 DIAGNOSIS — Z471 Aftercare following joint replacement surgery: Secondary | ICD-10-CM | POA: Diagnosis not present

## 2020-05-30 DIAGNOSIS — Z85828 Personal history of other malignant neoplasm of skin: Secondary | ICD-10-CM | POA: Diagnosis not present

## 2020-05-30 DIAGNOSIS — M65879 Other synovitis and tenosynovitis, unspecified ankle and foot: Secondary | ICD-10-CM | POA: Diagnosis not present

## 2020-05-30 DIAGNOSIS — G43909 Migraine, unspecified, not intractable, without status migrainosus: Secondary | ICD-10-CM | POA: Diagnosis not present

## 2020-05-30 DIAGNOSIS — M1611 Unilateral primary osteoarthritis, right hip: Secondary | ICD-10-CM | POA: Diagnosis not present

## 2020-05-30 DIAGNOSIS — G5603 Carpal tunnel syndrome, bilateral upper limbs: Secondary | ICD-10-CM | POA: Diagnosis not present

## 2020-05-30 DIAGNOSIS — I10 Essential (primary) hypertension: Secondary | ICD-10-CM | POA: Diagnosis not present

## 2020-05-30 DIAGNOSIS — M5410 Radiculopathy, site unspecified: Secondary | ICD-10-CM | POA: Diagnosis not present

## 2020-05-30 DIAGNOSIS — Z87442 Personal history of urinary calculi: Secondary | ICD-10-CM | POA: Diagnosis not present

## 2020-05-30 DIAGNOSIS — Z86010 Personal history of colonic polyps: Secondary | ICD-10-CM | POA: Diagnosis not present

## 2020-05-30 DIAGNOSIS — Z85820 Personal history of malignant melanoma of skin: Secondary | ICD-10-CM | POA: Diagnosis not present

## 2020-05-30 DIAGNOSIS — E669 Obesity, unspecified: Secondary | ICD-10-CM | POA: Diagnosis not present

## 2020-05-30 DIAGNOSIS — K219 Gastro-esophageal reflux disease without esophagitis: Secondary | ICD-10-CM | POA: Diagnosis not present

## 2020-05-30 DIAGNOSIS — Z87891 Personal history of nicotine dependence: Secondary | ICD-10-CM | POA: Diagnosis not present

## 2020-05-30 DIAGNOSIS — M7061 Trochanteric bursitis, right hip: Secondary | ICD-10-CM | POA: Diagnosis not present

## 2020-05-30 DIAGNOSIS — K5790 Diverticulosis of intestine, part unspecified, without perforation or abscess without bleeding: Secondary | ICD-10-CM | POA: Diagnosis not present

## 2020-05-30 DIAGNOSIS — M5127 Other intervertebral disc displacement, lumbosacral region: Secondary | ICD-10-CM | POA: Diagnosis not present

## 2020-05-30 DIAGNOSIS — M5137 Other intervertebral disc degeneration, lumbosacral region: Secondary | ICD-10-CM | POA: Diagnosis not present

## 2020-05-30 DIAGNOSIS — Z6841 Body Mass Index (BMI) 40.0 and over, adult: Secondary | ICD-10-CM | POA: Diagnosis not present

## 2020-05-30 DIAGNOSIS — J4520 Mild intermittent asthma, uncomplicated: Secondary | ICD-10-CM | POA: Diagnosis not present

## 2020-05-30 DIAGNOSIS — G8929 Other chronic pain: Secondary | ICD-10-CM | POA: Diagnosis not present

## 2020-05-30 DIAGNOSIS — N393 Stress incontinence (female) (male): Secondary | ICD-10-CM | POA: Diagnosis not present

## 2020-05-30 DIAGNOSIS — J4540 Moderate persistent asthma, uncomplicated: Secondary | ICD-10-CM | POA: Diagnosis not present

## 2020-05-30 DIAGNOSIS — K760 Fatty (change of) liver, not elsewhere classified: Secondary | ICD-10-CM | POA: Diagnosis not present

## 2020-05-30 DIAGNOSIS — Z96642 Presence of left artificial hip joint: Secondary | ICD-10-CM | POA: Diagnosis not present

## 2020-06-01 ENCOUNTER — Other Ambulatory Visit: Payer: Self-pay

## 2020-06-01 ENCOUNTER — Ambulatory Visit (INDEPENDENT_AMBULATORY_CARE_PROVIDER_SITE_OTHER): Payer: PPO | Admitting: Orthopaedic Surgery

## 2020-06-01 ENCOUNTER — Encounter: Payer: Self-pay | Admitting: Orthopaedic Surgery

## 2020-06-01 DIAGNOSIS — M25552 Pain in left hip: Secondary | ICD-10-CM | POA: Diagnosis not present

## 2020-06-01 DIAGNOSIS — T8131XD Disruption of external operation (surgical) wound, not elsewhere classified, subsequent encounter: Secondary | ICD-10-CM

## 2020-06-01 DIAGNOSIS — Z96642 Presence of left artificial hip joint: Secondary | ICD-10-CM

## 2020-06-01 DIAGNOSIS — G894 Chronic pain syndrome: Secondary | ICD-10-CM | POA: Diagnosis not present

## 2020-06-01 DIAGNOSIS — Z9189 Other specified personal risk factors, not elsewhere classified: Secondary | ICD-10-CM | POA: Diagnosis not present

## 2020-06-01 DIAGNOSIS — Z79899 Other long term (current) drug therapy: Secondary | ICD-10-CM | POA: Diagnosis not present

## 2020-06-01 NOTE — Progress Notes (Signed)
The patient is about 6 weeks status post a left anterior hip replacement.  She did have some wound dehiscence following surgery but is doing great with treatment to the wound center and packing by her daughter.  At this point the wound is gotten significantly smaller.  I assessed her today and it does not track deep at all.  There is no evidence of infection and no redness.  There is no significant drainage at all.  She is walking without assistive device and tolerates me easily putting her left operative hip through internal and external rotation without any difficulty at all.  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  She is getting an insert for this.  She is very pleased overall.  She will continue local wound care until this heals.  I will see her back in 4 weeks to see how she is doing overall but no x-rays are needed.

## 2020-06-02 ENCOUNTER — Encounter: Payer: PPO | Attending: Physician Assistant | Admitting: Physician Assistant

## 2020-06-02 DIAGNOSIS — X58XXXA Exposure to other specified factors, initial encounter: Secondary | ICD-10-CM | POA: Insufficient documentation

## 2020-06-02 DIAGNOSIS — Z96642 Presence of left artificial hip joint: Secondary | ICD-10-CM | POA: Diagnosis not present

## 2020-06-02 DIAGNOSIS — T8131XA Disruption of external operation (surgical) wound, not elsewhere classified, initial encounter: Secondary | ICD-10-CM | POA: Diagnosis not present

## 2020-06-02 DIAGNOSIS — L98495 Non-pressure chronic ulcer of skin of other sites with muscle involvement without evidence of necrosis: Secondary | ICD-10-CM | POA: Insufficient documentation

## 2020-06-02 NOTE — Progress Notes (Signed)
CARRERA, KIESEL ([id number]) Visit Report for 06/02/2020 Arrival Information Details Patient Name: Sharon [name], Sharon [name]. Date of Service: 06/02/2020 10:15 AM Medical Record Number: [id number] Patient Account Number: [id number] Date of Birth/Sex: 08/15/1955 (65 y.o. F) Treating RN: Cornell Barman Primary Care Arul Farabee: Elvia Collum Other Clinician: Referring Riven Beebe: Elvia Collum Treating Bertrum Helmstetter/Extender: Skipper Cliche in Treatment: 3 Visit Information History Since Last Visit Added or deleted any medications: No Patient Arrived: Sharon [name] Any new allergies or adverse reactions: No Arrival Time: 10:16 Had a fall or experienced change in No Accompanied By: daughter activities of daily living that may affect Transfer Assistance: None risk of falls: Patient Identification Verified: Yes Signs or symptoms of abuse/neglect since last visito No Secondary Verification Process Completed: Yes Hospitalized since last visit: No Implantable device outside of the clinic excluding No cellular tissue based products placed in the center since last visit: Has Dressing in Place as Prescribed: Yes Pain Present Now: No Electronic Signature(s) Signed: 06/02/2020 5:05:25 PM By: Lorine Bears RCP, RRT, CHT Entered By: Lorine Bears on 06/02/2020 10:17:26 Sharon [name] ([id number]) -------------------------------------------------------------------------------- Clinic Level of Care Assessment Details Patient Name: Sharon [name]. Date of Service: 06/02/2020 10:15 AM Medical Record Number: [id number] Patient Account Number: [id number] Date of Birth/Sex: 05/24/1956 (65 y.o. F) Treating RN: Cornell Barman Primary Care Catha Ontko: Elvia Collum Other Clinician: Referring Tu Bayle: Elvia Collum Treating Roshonda Sperl/Extender: Skipper Cliche in Treatment: 3 Clinic Level of Care Assessment Items TOOL 4 Quantity Score []  - Use when only an EandM is performed on FOLLOW-UP visit  0 ASSESSMENTS - Nursing Assessment / Reassessment X - Reassessment of Co-morbidities (includes updates in patient status) 1 10 X- 1 5 Reassessment of Adherence to Treatment Plan ASSESSMENTS - Wound and Skin Assessment / Reassessment X - Simple Wound Assessment / Reassessment - one wound 1 5 []  - 0 Complex Wound Assessment / Reassessment - multiple wounds []  - 0 Dermatologic / Skin Assessment (not related to wound area) ASSESSMENTS - Focused Assessment []  - Circumferential Edema Measurements - multi extremities 0 []  - 0 Nutritional Assessment / Counseling / Intervention []  - 0 Lower Extremity Assessment (monofilament, tuning fork, pulses) []  - 0 Peripheral Arterial Disease Assessment (using hand held doppler) ASSESSMENTS - Ostomy and/or Continence Assessment and Care []  - Incontinence Assessment and Management 0 []  - 0 Ostomy Care Assessment and Management (repouching, etc.) PROCESS - Coordination of Care X - Simple Patient / Family Education for ongoing care 1 15 []  - 0 Complex (extensive) Patient / Family Education for ongoing care X- 1 10 Staff obtains Programmer, systems, Records, Test Results / Process Orders []  - 0 Staff telephones HHA, Nursing Homes / Clarify orders / etc []  - 0 Routine Transfer to another Facility (non-emergent condition) []  - 0 Routine Hospital Admission (non-emergent condition) []  - 0 New Admissions / Biomedical engineer / Ordering NPWT, Apligraf, etc. []  - 0 Emergency Hospital Admission (emergent condition) X- 1 10 Simple Discharge Coordination []  - 0 Complex (extensive) Discharge Coordination PROCESS - Special Needs []  - Pediatric / Minor Patient Management 0 []  - 0 Isolation Patient Management []  - 0 Hearing / Language / Visual special needs []  - 0 Assessment of Community assistance (transportation, D/C planning, etc.) []  - 0 Additional assistance / Altered mentation []  - 0 Support Surface(s) Assessment (bed, cushion, seat,  etc.) INTERVENTIONS - Wound Cleansing / Measurement Sharon [name], Sharon K. ([id number]) X- 1 5 Simple Wound Cleansing - one wound []  - 0 Complex Wound Cleansing - multiple wounds X- 1 5  Wound Imaging (photographs - any number of wounds) []  - 0 Wound Tracing (instead of photographs) X- 1 5 Simple Wound Measurement - one wound []  - 0 Complex Wound Measurement - multiple wounds INTERVENTIONS - Wound Dressings []  - Small Wound Dressing one or multiple wounds 0 []  - 0 Medium Wound Dressing one or multiple wounds X- 1 20 Large Wound Dressing one or multiple wounds []  - 0 Application of Medications - topical []  - 0 Application of Medications - injection INTERVENTIONS - Miscellaneous []  - External ear exam 0 []  - 0 Specimen Collection (cultures, biopsies, blood, body fluids, etc.) []  - 0 Specimen(s) / Culture(s) sent or taken to Lab for analysis []  - 0 Patient Transfer (multiple staff / Civil Service fast streamer / Similar devices) []  - 0 Simple Staple / Suture removal (25 or less) []  - 0 Complex Staple / Suture removal (26 or more) []  - 0 Hypo / Hyperglycemic Management (close monitor of Blood Glucose) []  - 0 Ankle / Brachial Index (ABI) - do not check if billed separately X- 1 5 Vital Signs Has the patient been seen at the hospital within the last three years: Yes Total Score: 95 Level Of Care: New/Established - Level 3 Electronic Signature(s) Signed: 06/02/2020 5:26:41 PM By: Gretta Cool, BSN, RN, CWS, Kim RN, BSN Entered By: Gretta Cool, BSN, RN, CWS, Kim on 06/02/2020 10:55:03 Sharon [name] ([id number]) -------------------------------------------------------------------------------- Encounter Discharge Information Details Patient Name: Sharon [name]. Date of Service: 06/02/2020 10:15 AM Medical Record Number: [id number] Patient Account Number: [id number] Date of Birth/Sex: 1955-10-05 (65 y.o. F) Treating RN: Cornell Barman Primary Care Emmerson Shuffield: Elvia Collum Other Clinician: Referring  Anacleto Batterman: Elvia Collum Treating Amarilys Lyles/Extender: Skipper Cliche in Treatment: 3 Encounter Discharge Information Items Discharge Condition: Stable Ambulatory Status: Ambulatory Discharge Destination: Home Transportation: Private Auto Accompanied By: daughter Schedule Follow-up Appointment: Yes Clinical Summary of Care: Electronic Signature(s) Signed: 06/02/2020 5:26:41 PM By: Gretta Cool, BSN, RN, CWS, Kim RN, BSN Entered By: Gretta Cool, BSN, RN, CWS, Kim on 06/02/2020 10:55:58 Sharon [name] ([id number]) -------------------------------------------------------------------------------- Lower Extremity Assessment Details Patient Name: Sharon [name]. Date of Service: 06/02/2020 10:15 AM Medical Record Number: [id number] Patient Account Number: [id number] Date of Birth/Sex: 1956/02/11 (65 y.o. F) Treating RN: Cornell Barman Primary Care Ellar Hakala: Elvia Collum Other Clinician: Referring Linsy Ehresman: Elvia Collum Treating Merilyn Pagan/Extender: Skipper Cliche in Treatment: 3 Electronic Signature(s) Signed: 06/02/2020 5:26:41 PM By: Gretta Cool, BSN, RN, CWS, Kim RN, BSN Entered By: Gretta Cool, BSN, RN, CWS, Kim on 06/02/2020 10:39:10 Sharon [name] ([id number]) -------------------------------------------------------------------------------- Multi Wound Chart Details Patient Name: Sharon [name]. Date of Service: 06/02/2020 10:15 AM Medical Record Number: [id number] Patient Account Number: [id number] Date of Birth/Sex: [birth year] (65 y.o. F) Treating RN: Cornell Barman Primary Care Adalynn Corne: Elvia Collum Other Clinician: Referring Idaly Verret: Elvia Collum Treating Brietta Manso/Extender: Skipper Cliche in Treatment: 3 Vital Signs Height(in): Pulse(bpm): 52 Weight(lbs): Blood Pressure(mmHg): 129/83 Body Mass Index(BMI): Temperature(F): 98.5 Respiratory Rate(breaths/min): 16 Photos: [N/A:N/A] Wound Location: Left Groin N/A N/A Wounding Event: Surgical Injury N/A N/A Primary Etiology: Open  Surgical Wound N/A N/A Comorbid History: Osteoarthritis N/A N/A Date Acquired: 05/03/2020 N/A N/A Weeks of Treatment: 3 N/A N/A Wound Status: Open N/A N/A Measurements L x W x D (cm) 0.7x0.5x0.5 N/A N/A Area (cm) : 0.275 N/A N/A Volume (cm) : 0.137 N/A N/A % Reduction in Area: 92.00% N/A N/A % Reduction in Volume: 97.20% N/A N/A Position 1 (o'clock): 6 Maximum Distance 1 (cm): 1.5 Tunneling: Yes N/A N/A Classification: Full Thickness With Exposed N/A N/A Support Structures  Exudate Amount: Medium N/A N/A Exudate Type: Sanguinous N/A N/A Exudate Color: red N/A N/A Wound Margin: Flat and Intact N/A N/A Granulation Amount: Large (67-100%) N/A N/A Granulation Quality: Red N/A N/A Necrotic Amount: None Present (0%) N/A N/A Exposed Structures: Fat Layer (Subcutaneous Tissue): N/A N/A Yes Fascia: No Tendon: No Muscle: No Joint: No Bone: No Epithelialization: None N/A N/A Treatment Notes Electronic Signature(s) Signed: 06/02/2020 5:26:41 PM By: Elliot Gurney, BSN, RN, CWS, Kim RN, BSN Entered By: Elliot Gurney, BSN, RN, CWS, Kim on 06/02/2020 10:39:30 Sharon [name] ([id number] [name] ([id number]) -------------------------------------------------------------------------------- Multi-Disciplinary Care Plan Details Patient Name: Sharon [name], Sharon [name]. Date of Service: 06/02/2020 10:15 AM Medical Record Number: [id number] Patient Account Number: [id number] Date of Birth/Sex: 1955-10-01 (65 y.o. F) Treating RN: Huel Coventry Primary Care Loretta Doutt: Laroy Apple Other Clinician: Referring Danicka Hourihan: Laroy Apple Treating Devun Anna/Extender: Rowan Blase in Treatment: 3 Active Inactive Wound/Skin Impairment Nursing Diagnoses: Impaired tissue integrity Goals: Patient/caregiver will verbalize understanding of skin care regimen Date Initiated: 05/11/2020 Target Resolution Date: 06/11/2020 Goal Status: Active Ulcer/skin breakdown will have a volume reduction of 30% by week 4 Date  Initiated: 05/11/2020 Target Resolution Date: 06/11/2020 Goal Status: Active Interventions: Assess patient/caregiver ability to obtain necessary supplies Assess patient/caregiver ability to perform ulcer/skin care regimen upon admission and as needed Assess ulceration(s) every visit Provide education on ulcer and skin care Treatment Activities: Skin care regimen initiated : 05/11/2020 Notes: Electronic Signature(s) Signed: 06/02/2020 5:26:41 PM By: Elliot Gurney, BSN, RN, CWS, Kim RN, BSN Entered By: Elliot Gurney, BSN, RN, CWS, Kim on 06/02/2020 10:39:23 Sharon [name] ([id number]) -------------------------------------------------------------------------------- Pain Assessment Details Patient Name: Sharon [name]. Date of Service: 06/02/2020 10:15 AM Medical Record Number: [id number] Patient Account Number: [id number] Date of Birth/Sex: 1955-08-27 (65 y.o. F) Treating RN: Huel Coventry Primary Care Jaqueline Uber: Laroy Apple Other Clinician: Referring Rondi Ivy: Laroy Apple Treating Rohail Klees/Extender: Rowan Blase in Treatment: 3 Active Problems Location of Pain Severity and Description of Pain Patient Has Paino No Site Locations Pain Management and Medication Current Pain Management: Notes Patient denies pain at this time. Electronic Signature(s) Signed: 06/02/2020 5:26:41 PM By: Elliot Gurney, BSN, RN, CWS, Kim RN, BSN Entered By: Elliot Gurney, BSN, RN, CWS, Kim on 06/02/2020 10:33:18 Sharon [name] ([id number]) -------------------------------------------------------------------------------- Patient/Caregiver Education Details Patient Name: Sharon [name] Date of Service: 06/02/2020 10:15 AM Medical Record Number: [id number] Patient Account Number: [id number] Date of Birth/Gender: 01-08-1956 (65 y.o. F) Treating RN: Huel Coventry Primary Care Physician: Laroy Apple Other Clinician: Referring Physician: Laroy Apple Treating Physician/Extender: Rowan Blase in Treatment: 3 Education  Assessment Education Provided To: Patient Education Topics Provided Wound/Skin Impairment: Handouts: Other: continue wound care as prescribed Methods: Demonstration, Explain/Verbal Responses: State content correctly Electronic Signature(s) Signed: 06/02/2020 5:26:41 PM By: Elliot Gurney, BSN, RN, CWS, Kim RN, BSN Entered By: Elliot Gurney, BSN, RN, CWS, Kim on 06/02/2020 10:55:26 Sharon [name] ([id number]) -------------------------------------------------------------------------------- Wound Assessment Details Patient Name: Sharon [name]. Date of Service: 06/02/2020 10:15 AM Medical Record Number: [id number] Patient Account Number: [id number] Date of Birth/Sex: 04-30-1956 (65 y.o. F) Treating RN: Huel Coventry Primary Care Mehmet Scally: Laroy Apple Other Clinician: Referring Victorhugo Preis: Laroy Apple Treating Sylvia Kondracki/Extender: Rowan Blase in Treatment: 3 Wound Status Wound Number: 1 Primary Etiology: Open Surgical Wound Wound Location: Left Groin Wound Status: Open Wounding Event: Surgical Injury Comorbid History: Osteoarthritis Date Acquired: 05/03/2020 Weeks Of Treatment: 3 Clustered Wound: No Photos Wound Measurements Length: (cm) 0.7 Width: (cm) 0.5 Depth: (cm) 0.5 Area: (cm) 0.275 Volume: (cm) 0.137 % Reduction in Area: 92% %  Reduction in Volume: 97.2% Epithelialization: None Tunneling: Yes Position (o'clock): 6 Maximum Distance: (cm) 1.5 Undermining: No Wound Description Classification: Full Thickness With Exposed Support Structures Wound Margin: Flat and Intact Exudate Amount: Medium Exudate Type: Sanguinous Exudate Color: red Foul Odor After Cleansing: No Slough/Fibrino No Wound Bed Granulation Amount: Large (67-100%) Exposed Structure Granulation Quality: Red Fascia Exposed: No Necrotic Amount: None Present (0%) Fat Layer (Subcutaneous Tissue) Exposed: Yes Tendon Exposed: No Muscle Exposed: No Joint Exposed: No Bone Exposed: No Treatment Notes Wound  #1 (Groin) Wound Laterality: Left Cleanser Sharon [name], BEITZEL. ([id number]) Peri-Wound Care Topical Primary Dressing Prisma 4.34 (in) Discharge Instruction: Moisten w/normal saline or sterile water; Cover wound as directed. Do not remove from wound bed. Secondary Dressing Gauze Discharge Instruction: As directed: dry, moistened with saline or moistened with Dakins Solution Secured With Tegaderm Film 4x4 (in/in) Discharge Instruction: Apply to wound bed Compression Wrap Compression Stockings Add-Ons Electronic Signature(s) Signed: 06/02/2020 5:26:41 PM By: Elliot Gurney, BSN, RN, CWS, Kim RN, BSN Entered By: Elliot Gurney, BSN, RN, CWS, Kim on 06/02/2020 10:38:37 Sharon [name] ([id number]) -------------------------------------------------------------------------------- Vitals Details Patient Name: Sharon [name]. Date of Service: 06/02/2020 10:15 AM Medical Record Number: [id number] Patient Account Number: [id number] Date of Birth/Sex: Apr 02, 1956 (65 y.o. F) Treating RN: Huel Coventry Primary Care Alonah Lineback: Laroy Apple Other Clinician: Referring Zunaira Lamy: Laroy Apple Treating Eliyahu Bille/Extender: Rowan Blase in Treatment: 3 Vital Signs Time Taken: 10:17 Temperature (F): 98.5 Pulse (bpm): 69 Respiratory Rate (breaths/min): 16 Blood Pressure (mmHg): 129/83 Reference Range: 80 - 120 mg / dl Electronic Signature(s) Signed: 06/02/2020 5:05:25 PM By: Dayton Martes RCP, RRT, CHT Entered By: Dayton Martes on 06/02/2020 10:20:34

## 2020-06-02 NOTE — Progress Notes (Addendum)
Sharon [name], Sharon [name] ([id number]) Visit Report for 06/02/2020 Chief Complaint Document Details Patient Name: Sharon [name], Sharon [name]. Date of Service: 06/02/2020 10:15 AM Medical Record Number: [id number] Patient Account Number: [id number] Date of Birth/Sex: 02-21-1956 (65 y.o. F) Treating RN: Cornell Barman Primary Care Provider: Elvia Collum Other Clinician: Referring Provider: Elvia Collum Treating Provider/Extender: Skipper Cliche in Treatment: 3 Information Obtained from: Patient Chief Complaint Surgical wound dehiscence Electronic Signature(s) Signed: 06/02/2020 10:42:27 AM By: Worthy Keeler PA-C Entered By: Worthy Keeler on 06/02/2020 10:42:27 Sharon [name] ([id number]) -------------------------------------------------------------------------------- HPI Details Patient Name: Sharon [name] Date of Service: 06/02/2020 10:15 AM Medical Record Number: [id number] Patient Account Number: [id number] Date of Birth/Sex: 03/09/1956 (65 y.o. F) Treating RN: Cornell Barman Primary Care Provider: Elvia Collum Other Clinician: Referring Provider: Elvia Collum Treating Provider/Extender: Skipper Cliche in Treatment: 3 History of Present Illness HPI Description: 05/11/2020 upon evaluation today patient appears to be doing somewhat poorly in regard to her wound on the left hip/groin area status post having had a total hip replacement on the left as performed by Dr. Jean Rosenthal. She was admitted to the hospital on 04/14/2020 discharged 04/15/2020. She does tell me that following the surgery she has done very well from the standpoint of pain issues he is no major issues with pain currently. With that being said unfortunately when the sutures were removed she tells me the wound dehisced and she has had issues since that time. She does have a wound that appears to show some signs of granulation with that being said unfortunately she also upon further evaluation appears to have a  network of tunneling and undermining underneath even this area of apparent granulation tissue in the central portion of the wound. This has me concerned as I feel like this area is getting need to be packed. If not I am afraid that she is going to continue to develop a seroma and potentially even develop into infection. I discussed this with the patient today. With that being said we will obviously initiate packing today in order to try to allow this to heal more appropriately. With regard to the wound VAC again I discussed with the patient that I am not sure that skin to be appropriate currently considering the significant undermining that was seen I am not certain that she would really get the benefit necessary currently. I am not concerned about the multiple openings externally I am more concerned about the internal tracking and tunneling that I think would be more of an issue. The patient voiced understanding. With that being said also told her that ongoing I discussed the situation with her surgeon as well and I did put in a call with him today I will discuss that in the plan. 05/18/2020 upon evaluation today patient actually appears to be doing better than I actually anticipated in 1 week's time. The wound itself after cleaning out and packing appropriately last week has made excellent improvement and very pleased in this regard. Fortunately there is no evidence of active infection locally or systemically which is excellent. No fevers, chills, nausea, vomiting, or diarrhea. 06/02/2020 upon evaluation today patient appears to be doing quite well in regard to her wound. She has been tolerating the dressing changes without complication. Fortunately there is no signs of active infection at this time. In general I been very pleased with how she seems to be managing and in fact the progress that she is made with regard to the surgical wound. Again I  was somewhat worried that this was can have a very  difficult time healing but her daughter has done excellent job taking care of this currently. Electronic Signature(s) Signed: 06/02/2020 4:50:01 PM By: Worthy Keeler PA-C Entered By: Worthy Keeler on 06/02/2020 16:50:01 Sharon [name] ([id number]) -------------------------------------------------------------------------------- Physical Exam Details Patient Name: Sharon [name] Date of Service: 06/02/2020 10:15 AM Medical Record Number: [id number] Patient Account Number: [id number] Date of Birth/Sex: Nov 17, 1955 (65 y.o. F) Treating RN: Cornell Barman Primary Care Provider: Elvia Collum Other Clinician: Referring Provider: Elvia Collum Treating Provider/Extender: Skipper Cliche in Treatment: 3 Constitutional Obese and well-hydrated in no acute distress. Respiratory normal breathing without difficulty. Psychiatric this patient is able to make decisions and demonstrates good insight into disease process. Alert and Oriented x 3. pleasant and cooperative. Notes Upon inspection patient's wound bed actually showed signs of good granulation epithelization there does not appear to be any evidence of active infection systemically and with regard to the tunneling this has pretty much cleared to the point that she has really little to nothing remaining. I do believe that she is very close to complete resolution to be honest I think that we do not really need to pack this any longer other than packing with collagen on oblique she will need a packing strip that is. Electronic Signature(s) Signed: 06/02/2020 4:50:34 PM By: Worthy Keeler PA-C Entered By: Worthy Keeler on 06/02/2020 16:50:33 Sharon [name] ([id number]) -------------------------------------------------------------------------------- Physician Orders Details Patient Name: Sharon [name]. Date of Service: 06/02/2020 10:15 AM Medical Record Number: [id number] Patient Account Number: [id number] Date of Birth/Sex:  1955/10/26 (65 y.o. 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 Bathing/ Shower/ Hygiene o May shower with wound dressing protected with water repellent cover or cast protector. Wound Treatment Wound #1 - Groin Wound Laterality: Left Primary Dressing: Prisma 4.34 (in) 1 x Per CBJ/62 Days Discharge Instructions: Moisten w/normal saline or sterile water; Cover wound as directed. Do not remove from wound bed. Secondary Dressing: Gauze 1 x Per GBT/51 Days Discharge Instructions: As directed: dry, moistened with saline or moistened with Dakins Solution Secured With: Tegaderm Film 4x4 (in/in) 1 x Per Day/15 Days Discharge Instructions: Apply to wound bed Electronic Signature(s) Signed: 06/02/2020 4:17:09 PM By: Worthy Keeler PA-C Signed: 06/02/2020 5:26:41 PM By: Gretta Cool, BSN, RN, CWS, Kim RN, BSN Entered By: Gretta Cool, BSN, RN, CWS, Kim on 06/02/2020 10:54:00 Sharon [name] ([id number]) -------------------------------------------------------------------------------- Problem List Details Patient Name: ARIYAN, SINNETT. Date of Service: 06/02/2020 10:15 AM Medical Record Number: [id number] Patient Account Number: [id number] Date of Birth/Sex: 03/08/1956 (65 y.o. 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 Date of Service: 06/02/2020 10:15 AM Medical Record Number: [id number] Patient Account Number: [id number] Date of Birth/Sex: 02/25/1956 (65 y.o. F) Treating RN: Cornell Barman Primary Care Provider: Elvia Collum Other Clinician: Referring Provider: Elvia Collum Treating Provider/Extender: Skipper Cliche in Treatment: 3 Subjective Chief Complaint Information obtained from Patient Surgical wound dehiscence History of Present Illness (HPI) 05/11/2020 upon evaluation today patient appears to be doing somewhat poorly in regard to her wound on the left hip/groin area status post having had a total hip replacement on the left as performed by Dr. Jean Rosenthal. She was admitted to the hospital on 04/14/2020 discharged 04/15/2020. She does tell me that following the surgery she has done very well from the standpoint of pain issues he is no major issues with pain currently. With that being said unfortunately when the sutures were removed she tells me the wound dehisced and she has had issues since that time. She does have a wound that appears to show some signs of granulation with that being said unfortunately she also upon further evaluation appears to have a network of tunneling and undermining underneath even this area of apparent granulation tissue in the central portion of the wound. This has me  concerned as I feel like this area is getting need to be packed. If not I am afraid that she is going to continue to develop a seroma and potentially even develop into infection. I discussed this with the patient today. With that being said we will obviously initiate packing today in order to try to allow this to heal more appropriately. With regard to the wound VAC again I discussed with the patient that I am not sure that skin to be appropriate currently considering the significant undermining that was seen I am not certain that she would really get the benefit necessary currently. I am not concerned about the multiple openings externally I am more concerned about the internal tracking and tunneling that I think would be more of an issue. The patient voiced understanding. With that being said also told her that ongoing I discussed the situation with her surgeon as well and I did put in a call with him today I will discuss that in the plan. 05/18/2020 upon evaluation today patient actually appears to be doing better than I actually anticipated in 1 week's time. The wound itself after cleaning out and packing appropriately last week has made excellent improvement and very pleased in this regard. Fortunately there is no evidence of active infection locally or systemically which is excellent. No fevers, chills, nausea, vomiting, or diarrhea. 06/02/2020 upon evaluation today patient appears to be doing quite well in regard to her wound. She has been tolerating the dressing changes without complication. Fortunately there is no signs of active infection at this time. In general I been very pleased with how she seems to be managing and in fact the progress that she is made with regard to the surgical wound. Again I was somewhat worried that this was can have a very difficult time healing but her daughter has done excellent job taking care of this currently. Objective Constitutional Obese and well-hydrated in  no acute distress. Vitals Time Taken: 10:17 AM, Temperature: 98.5 F, Pulse: 69 bpm, Respiratory Rate: 16 breaths/min, Blood Pressure: 129/83 mmHg. Respiratory normal breathing without difficulty. Psychiatric this patient is able to make decisions and demonstrates good insight into disease process. Alert and Oriented x  3. pleasant and cooperative. General Notes: Upon inspection patient's wound bed actually showed signs of good granulation epithelization there does not appear to be any evidence of active infection systemically and with regard to the tunneling this has pretty much cleared to the point that she has really little to nothing remaining. I do believe that she is very close to complete resolution to be honest I think that we do not really need to pack this any longer other than packing with collagen on oblique she will need a packing strip that is. Integumentary (Hair, Skin) Wound #1 status is Open. Original cause of wound was Surgical Injury. The wound is located on the Left Groin. The wound measures 0.7cm length x 0.5cm width x 0.5cm depth; 0.275cm^2 area and 0.137cm^3 volume. There is Fat Layer (Subcutaneous Tissue) exposed. There is no undermining noted, however, there is tunneling at 6:00 with a maximum distance of 1.5cm. There is a medium amount of sanguinous drainage noted. The wound margin is flat and intact. There is large (67-100%) red granulation within the wound bed. There is no necrotic tissue within the wound bed. 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 Bathing/ Shower/ Hygiene: May shower with wound dressing protected with water repellent cover or cast protector. WOUND #1: - Groin Wound  Laterality: Left Primary Dressing: Prisma 4.34 (in) 1 x Per Day/15 Days Discharge Instructions: Moisten w/normal saline or sterile water; Cover wound as directed. Do not remove from wound bed. Secondary Dressing: Gauze 1 x Per Day/15 Days Discharge Instructions: As directed: dry, moistened with saline or moistened with Dakins Solution Secured With: Tegaderm Film 4x4 (in/in) 1 x Per Day/15 Days Discharge Instructions: Apply to wound bed 1. Would recommend currently we switch to silver collagen to pack into the wound no packing strip necessary. 2. Recommend patient continue with a gauze to cover followed by Tegaderm to secure in place. 3. I would recommend she continue to monitor for any evidence of infection the right now I see nothing and I think her daughter is doing a great job keeping a close eye on this they will continue as such. We will see patient back for reevaluation in 1 week here in the clinic. If anything worsens or changes patient will contact our office for additional recommendations. Electronic Signature(s) Signed: 06/02/2020 4:51:05 PM By: Lenda Kelp PA-C Entered By: Lenda Kelp on 06/02/2020 16:51:05 Christell Faith ([id number]) -------------------------------------------------------------------------------- SuperBill Details Patient Name: Christell Faith. Date of Service: 06/02/2020 Medical Record Number: [id number] Patient Account Number: [id number] Date of Birth/Sex: 06-Sep-1955 (65 y.o. 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 Facility Procedures CPT4 Code:  [id number] Description: 99213 - WOUND CARE VISIT-LEV 3 EST PT Modifier: Quantity: 1 Physician Procedures CPT4 Code Description: [id number] 99213 - WC PHYS LEVEL 3 - EST PT Modifier: Quantity: 1 CPT4 Code Description: ICD-10 Diagnosis Description T81.31XA Disruption of external operation (surgical) wound, not elsewhere classified 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

## 2020-06-05 DIAGNOSIS — Z6841 Body Mass Index (BMI) 40.0 and over, adult: Secondary | ICD-10-CM | POA: Diagnosis not present

## 2020-06-05 DIAGNOSIS — N393 Stress incontinence (female) (male): Secondary | ICD-10-CM | POA: Diagnosis not present

## 2020-06-05 DIAGNOSIS — Z85820 Personal history of malignant melanoma of skin: Secondary | ICD-10-CM | POA: Diagnosis not present

## 2020-06-05 DIAGNOSIS — J4540 Moderate persistent asthma, uncomplicated: Secondary | ICD-10-CM | POA: Diagnosis not present

## 2020-06-05 DIAGNOSIS — Z87442 Personal history of urinary calculi: Secondary | ICD-10-CM | POA: Diagnosis not present

## 2020-06-05 DIAGNOSIS — Z86010 Personal history of colonic polyps: Secondary | ICD-10-CM | POA: Diagnosis not present

## 2020-06-05 DIAGNOSIS — M5127 Other intervertebral disc displacement, lumbosacral region: Secondary | ICD-10-CM | POA: Diagnosis not present

## 2020-06-05 DIAGNOSIS — M1611 Unilateral primary osteoarthritis, right hip: Secondary | ICD-10-CM | POA: Diagnosis not present

## 2020-06-05 DIAGNOSIS — Z471 Aftercare following joint replacement surgery: Secondary | ICD-10-CM | POA: Diagnosis not present

## 2020-06-05 DIAGNOSIS — G43909 Migraine, unspecified, not intractable, without status migrainosus: Secondary | ICD-10-CM | POA: Diagnosis not present

## 2020-06-05 DIAGNOSIS — Z87891 Personal history of nicotine dependence: Secondary | ICD-10-CM | POA: Diagnosis not present

## 2020-06-05 DIAGNOSIS — K5790 Diverticulosis of intestine, part unspecified, without perforation or abscess without bleeding: Secondary | ICD-10-CM | POA: Diagnosis not present

## 2020-06-05 DIAGNOSIS — Z96642 Presence of left artificial hip joint: Secondary | ICD-10-CM | POA: Diagnosis not present

## 2020-06-05 DIAGNOSIS — M5137 Other intervertebral disc degeneration, lumbosacral region: Secondary | ICD-10-CM | POA: Diagnosis not present

## 2020-06-05 DIAGNOSIS — J4520 Mild intermittent asthma, uncomplicated: Secondary | ICD-10-CM | POA: Diagnosis not present

## 2020-06-05 DIAGNOSIS — Z85828 Personal history of other malignant neoplasm of skin: Secondary | ICD-10-CM | POA: Diagnosis not present

## 2020-06-05 DIAGNOSIS — M5410 Radiculopathy, site unspecified: Secondary | ICD-10-CM | POA: Diagnosis not present

## 2020-06-05 DIAGNOSIS — I10 Essential (primary) hypertension: Secondary | ICD-10-CM | POA: Diagnosis not present

## 2020-06-05 DIAGNOSIS — M7061 Trochanteric bursitis, right hip: Secondary | ICD-10-CM | POA: Diagnosis not present

## 2020-06-05 DIAGNOSIS — E669 Obesity, unspecified: Secondary | ICD-10-CM | POA: Diagnosis not present

## 2020-06-05 DIAGNOSIS — K760 Fatty (change of) liver, not elsewhere classified: Secondary | ICD-10-CM | POA: Diagnosis not present

## 2020-06-05 DIAGNOSIS — G5603 Carpal tunnel syndrome, bilateral upper limbs: Secondary | ICD-10-CM | POA: Diagnosis not present

## 2020-06-05 DIAGNOSIS — G8929 Other chronic pain: Secondary | ICD-10-CM | POA: Diagnosis not present

## 2020-06-05 DIAGNOSIS — M65879 Other synovitis and tenosynovitis, unspecified ankle and foot: Secondary | ICD-10-CM | POA: Diagnosis not present

## 2020-06-05 DIAGNOSIS — K219 Gastro-esophageal reflux disease without esophagitis: Secondary | ICD-10-CM | POA: Diagnosis not present

## 2020-06-09 ENCOUNTER — Encounter: Payer: PPO | Admitting: Physician Assistant

## 2020-06-09 ENCOUNTER — Other Ambulatory Visit: Payer: Self-pay

## 2020-06-09 DIAGNOSIS — T8131XA Disruption of external operation (surgical) wound, not elsewhere classified, initial encounter: Secondary | ICD-10-CM | POA: Diagnosis not present

## 2020-06-09 NOTE — Progress Notes (Addendum)
NALINI, RICKEY ([id number]) Visit Report for 06/09/2020 Chief Complaint Document Details Patient Name: Sharon [name], Sharon [name]. Date of Service: 06/09/2020 1:30 PM Medical Record Number: [id number] Patient Account Number: [id number] Date of Birth/Sex: February 17, 1956 (65 y.o. F) Treating RN: Cornell Barman Primary Care Provider: Elvia Collum Other Clinician: Referring Provider: Elvia Collum Treating Provider/Extender: Skipper Cliche in Treatment: 4 Information Obtained from: Patient Chief Complaint Surgical wound dehiscence Electronic Signature(s) Signed: 06/09/2020 1:09:21 PM By: Worthy Keeler PA-C Entered By: Worthy Keeler on 06/09/2020 13:09:21 Sharon [name] ([id number]) -------------------------------------------------------------------------------- HPI Details Patient Name: Sharon [name] Date of Service: 06/09/2020 1:30 PM Medical Record Number: [id number] Patient Account Number: [id number] Date of Birth/Sex: [birth year] (65 y.o. F) Treating RN: Cornell Barman Primary Care Provider: Elvia Collum Other Clinician: Referring Provider: Elvia Collum Treating Provider/Extender: Skipper Cliche in Treatment: 4 History of Present Illness HPI Description: 05/11/2020 upon evaluation today patient appears to be doing somewhat poorly in regard to her wound on the left hip/groin area status post having had a total hip replacement on the left as performed by Dr. Jean Rosenthal. She was admitted to the hospital on 04/14/2020 discharged 04/15/2020. She does tell me that following the surgery she has done very well from the standpoint of pain issues he is no major issues with pain currently. With that being said unfortunately when the sutures were removed she tells me the wound dehisced and she has had issues since that time. She does have a wound that appears to show some signs of granulation with that being said unfortunately she also upon further evaluation appears to have a  network of tunneling and undermining underneath even this area of apparent granulation tissue in the central portion of the wound. This has me concerned as I feel like this area is getting need to be packed. If not I am afraid that she is going to continue to develop a seroma and potentially even develop into infection. I discussed this with the patient today. With that being said we will obviously initiate packing today in order to try to allow this to heal more appropriately. With regard to the wound VAC again I discussed with the patient that I am not sure that skin to be appropriate currently considering the significant undermining that was seen I am not certain that she would really get the benefit necessary currently. I am not concerned about the multiple openings externally I am more concerned about the internal tracking and tunneling that I think would be more of an issue. The patient voiced understanding. With that being said also told her that ongoing I discussed the situation with her surgeon as well and I did put in a call with him today I will discuss that in the plan. 05/18/2020 upon evaluation today patient actually appears to be doing better than I actually anticipated in 1 week's time. The wound itself after cleaning out and packing appropriately last week has made excellent improvement and very pleased in this regard. Fortunately there is no evidence of active infection locally or systemically which is excellent. No fevers, chills, nausea, vomiting, or diarrhea. 06/02/2020 upon evaluation today patient appears to be doing quite well in regard to her wound. She has been tolerating the dressing changes without complication. Fortunately there is no signs of active infection at this time. In general I been very pleased with how she seems to be managing and in fact the progress that she is made with regard to the surgical wound. Again I  was somewhat worried that this was can have a very  difficult time healing but her daughter has done excellent job taking care of this currently. 06/09/2020 on evaluation today patient's wound actually appears to be pretty much completely healed at this point. There is just a little increase at the top of the wound where this has not completely sealed but it is all but. I do believe that for all intensive purposes we can call this healed I think just a little bit of antibiotic ointment a Band-Aid over it will likely suffice to see this completely closed in the next day or 2. Electronic Signature(s) Signed: 06/09/2020 1:41:45 PM By: Worthy Keeler PA-C Entered By: Worthy Keeler on 06/09/2020 13:41:45 Sharon [name] ([id number]) -------------------------------------------------------------------------------- Physical Exam Details Patient Name: Sharon [name] Date of Service: 06/09/2020 1:30 PM Medical Record Number: [id number] Patient Account Number: [id number] Date of Birth/Sex: Jul 25, 1955 (65 y.o. F) Treating RN: Cornell Barman Primary Care Provider: Elvia Collum Other Clinician: Referring Provider: Elvia Collum Treating Provider/Extender: Skipper Cliche in Treatment: 4 Constitutional Obese and well-hydrated in no acute distress. Respiratory normal breathing without difficulty. Psychiatric this patient is able to make decisions and demonstrates good insight into disease process. Alert and Oriented x 3. pleasant and cooperative. Notes Upon inspection patient's wound bed actually showed signs of pretty much complete epithelization there is no significant drainage there is a little crease at the top of the wound but nothing that is seriously open at this point. I think she is pretty much healed and I think will do well with just a little bit of triple antibiotic ointment and a Band-Aid at this time. Electronic Signature(s) Signed: 06/09/2020 1:42:09 PM By: Worthy Keeler PA-C Entered By: Worthy Keeler on 06/09/2020  13:42:09 Sharon [name] ([id number]) -------------------------------------------------------------------------------- Physician Orders Details Patient Name: Sharon [name]. Date of Service: 06/09/2020 1:30 PM Medical Record Number: [id number] Patient Account Number: [id number] Date of Birth/Sex: 03/03/1956 (65 y.o. 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 Date of Service: 06/09/2020 1:30 PM Medical Record Number: [id number] Patient Account Number: [id number] Date of Birth/Sex: July 07, 1955 (65 y.o. F) Treating RN: Cornell Barman Primary Care Provider: Elvia Collum Other Clinician: Referring Provider: Elvia Collum Treating Provider/Extender: Skipper Cliche in Treatment: 4 Active Problems ICD-10 Encounter Code Description Active Date MDM Diagnosis T81.31XA Disruption of external operation (surgical) wound, not elsewhere 05/11/2020 No Yes classified, initial encounter L98.495 Non-pressure chronic ulcer of skin of other sites with muscle 05/11/2020 No Yes involvement without evidence of necrosis Inactive  Problems Resolved Problems Electronic Signature(s) Signed: 06/09/2020 1:09:14 PM By: Worthy Keeler PA-C Entered By: Worthy Keeler on 06/09/2020 13:09:14 Sharon [name] ([id number]) -------------------------------------------------------------------------------- Progress Note Details Patient Name: Sharon [name]. Date of Service: 06/09/2020 1:30 PM Medical Record Number: [id number] Patient Account Number: [id number] Date of Birth/Sex: [birth year] (65 y.o. F)  Treating RN: Cornell Barman Primary Care Provider: Elvia Collum Other Clinician: Referring Provider: Elvia Collum Treating Provider/Extender: Skipper Cliche in Treatment: 4 Subjective Chief Complaint Information obtained from Patient Surgical wound dehiscence History of Present Illness (HPI) 05/11/2020 upon evaluation today patient appears to be doing somewhat poorly in regard to her wound on the left hip/groin area status post having had a total hip replacement on the left as performed by Dr. Jean Rosenthal. She was admitted to the hospital on 04/14/2020 discharged 04/15/2020. She does tell me that following the surgery she has done very well from the standpoint of pain issues he is no major issues with pain currently. With that being said unfortunately when the sutures were removed she tells me the wound dehisced and she has had issues since that time. She does have a wound that appears to show some signs of granulation with that being said unfortunately she also upon further evaluation appears to have a network of tunneling and undermining underneath even this area of apparent granulation tissue in the central portion of the wound. This has me concerned as I feel like this area is getting need to be packed. If not I am afraid that she is going to continue to develop a seroma and potentially even develop into infection. I discussed this with the patient today. With that being said we will obviously initiate packing  today in order to try to allow this to heal more appropriately. With regard to the wound VAC again I discussed with the patient that I am not sure that skin to be appropriate currently considering the significant undermining that was seen I am not certain that she would really get the benefit necessary currently. I am not concerned about the multiple openings externally I am more concerned about the internal tracking and tunneling that I think would be more of an issue. The patient voiced understanding. With that being said also told her that ongoing I discussed the situation with her surgeon as well and I did put in a call with him today I will discuss that in the plan. 05/18/2020 upon evaluation today patient actually appears to be doing better than I actually anticipated in 1 week's time. The wound itself after cleaning out and packing appropriately last week has made excellent improvement and very pleased in this regard. Fortunately there is no evidence of active infection locally or systemically which is excellent. No fevers, chills, nausea, vomiting, or diarrhea. 06/02/2020 upon evaluation today patient appears to be doing quite well in regard to her wound. She has been tolerating the dressing changes without complication. Fortunately there is no signs of active infection at this time. In general I been very pleased with how she seems to be managing and in fact the progress that she is made with regard to the surgical wound. Again I was somewhat worried that this was can have a very difficult time healing but her daughter has done excellent job taking care of this currently. 06/09/2020 on evaluation today patient's wound actually appears to be pretty much completely healed at this point. There is just a little increase at the top of the wound where this has not completely sealed but it is all but. I do believe that for all intensive purposes we can call this healed I think just a little bit of  antibiotic ointment a Band-Aid over it will likely suffice to see this completely closed in the next day or 2. Objective Constitutional Obese and well-hydrated in no  acute distress. Vitals Time Taken: 1:18 PM, Temperature: 98.3 F, Pulse: 65 bpm, Respiratory Rate: 16 breaths/min, Blood Pressure: 116/75 mmHg. Respiratory normal breathing without difficulty. Psychiatric this patient is able to make decisions and demonstrates good insight into disease process. Alert and Oriented x 3. pleasant and cooperative. General Notes: Upon inspection patient's wound bed actually showed signs of pretty much complete epithelization there is no significant drainage there is a little crease at the top of the wound but nothing that is seriously open at this point. I think she is pretty much healed and I think will do well with just a little bit of triple antibiotic ointment and a Band-Aid at this time. Integumentary (Hair, Skin) Wound #1 status is Healed - Epithelialized. Original cause of wound was Surgical Injury. The wound is located on the Left Groin. The wound measures 0cm length x 0cm width x 0cm depth; 0cm^2 area and 0cm^3 volume. There is no tunneling or undermining noted. There is a none present amount of drainage noted. The wound margin is flat and intact. There is no granulation within the wound bed. There is no necrotic tissue LORAY, AKARD K. ([id number]) within the wound bed. 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 Would recommend currently discontinue wound care services as the patient appears to be completely healed and is doing excellent. I see no signs of infection currently. 2. Also recommend that the patient  continue to monitor for any evidence of infection or otherwise if she has any issues she will let me know otherwise I think she is doing excellent and is ready for discharge today. We will see her back for follow-up visit as needed. Electronic Signature(s) Signed: 06/09/2020 1:42:35 PM By: Worthy Keeler PA-C Entered By: Worthy Keeler on 06/09/2020 13:42:35 Sharon [name] ([id number]) -------------------------------------------------------------------------------- SuperBill Details Patient Name: Sharon [name]. Date of Service: 06/09/2020 Medical Record Number: [id number] Patient Account Number: [id number] Date of Birth/Sex: 11-01-1955 (65 y.o. 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 Facility Procedures CPT4 Code: [id number] Description: [id number] - WOUND CARE VISIT-LEV 2 EST PT Modifier: Quantity: 1 Physician Procedures CPT4 Code Description: [id number] - WC PHYS LEVEL 3 - EST PT Modifier: Quantity: 1 CPT4 Code Description: ICD-10 Diagnosis Description T81.31XA Disruption of external operation (surgical) wound, not elsewhere classified 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

## 2020-06-09 NOTE — Progress Notes (Addendum)
ODDIE, KUHLMANN ([id number]) Visit Report for 06/09/2020 Arrival Information Details Patient Name: Sharon [name], Sharon [name]. Date of Service: 06/09/2020 1:30 PM Medical Record Number: [id number] Patient Account Number: [id number] Date of Birth/Sex: 08-19-1955 (65 y.o. F) Treating RN: Cornell Barman Primary Care Adaijah Endres: Elvia Collum Other Clinician: Referring Sharron Simpson: Elvia Collum Treating Casandra Dallaire/Extender: Skipper Cliche in Treatment: 4 Visit Information History Since Last Visit Added or deleted any medications: No Patient Arrived: Sharon [name] Any new allergies or adverse reactions: No Arrival Time: 13:12 Had a fall or experienced change in No Accompanied By: daughter activities of daily living that may affect Transfer Assistance: None risk of falls: Patient Identification Verified: Yes Signs or symptoms of abuse/neglect since last visito No Secondary Verification Process Completed: Yes Hospitalized since last visit: No Implantable device outside of the clinic excluding No cellular tissue based products placed in the center since last visit: Has Dressing in Place as Prescribed: Yes Pain Present Now: No Electronic Signature(s) Signed: 06/09/2020 4:14:11 PM By: Lorine Bears RCP, RRT, CHT Entered By: Lorine Bears on 06/09/2020 13:18:00 Sharon [name] ([id number]) -------------------------------------------------------------------------------- Clinic Level of Care Assessment Details Patient Name: Sharon [name]. Date of Service: 06/09/2020 1:30 PM Medical Record Number: [id number] Patient Account Number: [id number] Date of Birth/Sex: 05-23-1956 (65 y.o. F) Treating RN: Dolan Amen Primary Care Lakeasha Petion: Elvia Collum Other Clinician: Referring Taeya Theall: Elvia Collum Treating Paolo Okane/Extender: Skipper Cliche in Treatment: 4 Clinic Level of Care Assessment Items TOOL 4 Quantity Score X - Use when only an EandM is performed on FOLLOW-UP  visit 1 0 ASSESSMENTS - Nursing Assessment / Reassessment X - Reassessment of Co-morbidities (includes updates in patient status) 1 10 X- 1 5 Reassessment of Adherence to Treatment Plan ASSESSMENTS - Wound and Skin Assessment / Reassessment X - Simple Wound Assessment / Reassessment - one wound 1 5 []  - 0 Complex Wound Assessment / Reassessment - multiple wounds []  - 0 Dermatologic / Skin Assessment (not related to wound area) ASSESSMENTS - Focused Assessment []  - Circumferential Edema Measurements - multi extremities 0 []  - 0 Nutritional Assessment / Counseling / Intervention []  - 0 Lower Extremity Assessment (monofilament, tuning fork, pulses) []  - 0 Peripheral Arterial Disease Assessment (using hand held doppler) ASSESSMENTS - Ostomy and/or Continence Assessment and Care []  - Incontinence Assessment and Management 0 []  - 0 Ostomy Care Assessment and Management (repouching, etc.) PROCESS - Coordination of Care X - Simple Patient / Family Education for ongoing care 1 15 []  - 0 Complex (extensive) Patient / Family Education for ongoing care []  - 0 Staff obtains Programmer, systems, Records, Test Results / Process Orders []  - 0 Staff telephones HHA, Nursing Homes / Clarify orders / etc []  - 0 Routine Transfer to another Facility (non-emergent condition) []  - 0 Routine Hospital Admission (non-emergent condition) []  - 0 New Admissions / Biomedical engineer / Ordering NPWT, Apligraf, etc. []  - 0 Emergency Hospital Admission (emergent condition) X- 1 10 Simple Discharge Coordination []  - 0 Complex (extensive) Discharge Coordination PROCESS - Special Needs []  - Pediatric / Minor Patient Management 0 []  - 0 Isolation Patient Management []  - 0 Hearing / Language / Visual special needs []  - 0 Assessment of Community assistance (transportation, D/C planning, etc.) []  - 0 Additional assistance / Altered mentation []  - 0 Support Surface(s) Assessment (bed, cushion, seat,  etc.) INTERVENTIONS - Wound Cleansing / Measurement Sharon [name], Sharon K. ([id number]) X- 1 5 Simple Wound Cleansing - one wound []  - 0 Complex Wound Cleansing - multiple wounds X- 1  5 Wound Imaging (photographs - any number of wounds) []  - 0 Wound Tracing (instead of photographs) X- 1 5 Simple Wound Measurement - one wound []  - 0 Complex Wound Measurement - multiple wounds INTERVENTIONS - Wound Dressings X - Small Wound Dressing one or multiple wounds 1 10 []  - 0 Medium Wound Dressing one or multiple wounds []  - 0 Large Wound Dressing one or multiple wounds []  - 0 Application of Medications - topical []  - 0 Application of Medications - injection INTERVENTIONS - Miscellaneous []  - External ear exam 0 []  - 0 Specimen Collection (cultures, biopsies, blood, body fluids, etc.) []  - 0 Specimen(s) / Culture(s) sent or taken to Lab for analysis []  - 0 Patient Transfer (multiple staff / Civil Service fast streamer / Similar devices) []  - 0 Simple Staple / Suture removal (25 or less) []  - 0 Complex Staple / Suture removal (26 or more) []  - 0 Hypo / Hyperglycemic Management (close monitor of Blood Glucose) []  - 0 Ankle / Brachial Index (ABI) - do not check if billed separately X- 1 5 Vital Signs Has the patient been seen at the hospital within the last three years: Yes Total Score: 75 Level Of Care: New/Established - Level 2 Electronic Signature(s) Signed: 06/09/2020 2:10:41 PM By: Georges Mouse, Minus Breeding RN Entered By: Georges Mouse, Kenia on 06/09/2020 13:35:18 Sharon [name] ([id number]) -------------------------------------------------------------------------------- Encounter Discharge Information Details Patient Name: Sharon [name]. Date of Service: 06/09/2020 1:30 PM Medical Record Number: [id number] Patient Account Number: [id number] Date of Birth/Sex: December 04, 1955 (65 y.o. F) Treating RN: Dolan Amen Primary Care Bradyn Vassey: Elvia Collum Other Clinician: Referring Tracy Kinner:  Elvia Collum Treating Marton Malizia/Extender: Skipper Cliche in Treatment: 4 Encounter Discharge Information Items Discharge Condition: Stable Ambulatory Status: Ambulatory Discharge Destination: Home Transportation: Private Auto Accompanied By: grandaughter Schedule Follow-up Appointment: No Clinical Summary of Care: Electronic Signature(s) Signed: 06/09/2020 2:10:41 PM By: Georges Mouse, Minus Breeding RN Entered By: Georges Mouse, Minus Breeding on 06/09/2020 13:36:34 Sharon [name] ([id number]) -------------------------------------------------------------------------------- Lower Extremity Assessment Details Patient Name: Sharon [name]. Date of Service: 06/09/2020 1:30 PM Medical Record Number: [id number] Patient Account Number: [id number] Date of Birth/Sex: 10-04-1955 (65 y.o. F) Treating RN: Dolan Amen Primary Care Alizabeth Antonio: Elvia Collum Other Clinician: Referring Micharl Helmes: Elvia Collum Treating Deziya Amero/Extender: Skipper Cliche in Treatment: 4 Electronic Signature(s) Signed: 06/09/2020 2:10:41 PM By: Georges Mouse, Minus Breeding RN Entered By: Georges Mouse, Minus Breeding on 06/09/2020 13:25:49 Sharon [name] ([id number]) -------------------------------------------------------------------------------- Multi Wound Chart Details Patient Name: Sharon [name]. Date of Service: 06/09/2020 1:30 PM Medical Record Number: [id number] Patient Account Number: [id number] Date of Birth/Sex: 08/23/1955 (65 y.o. F) Treating RN: Dolan Amen Primary Care Davan Nawabi: Elvia Collum Other Clinician: Referring Willye Javier: Elvia Collum Treating Shenicka Sunderlin/Extender: Skipper Cliche in Treatment: 4 Vital Signs Height(in): Pulse(bpm): 65 Weight(lbs): Blood Pressure(mmHg): 116/75 Body Mass Index(BMI): Temperature(F): 98.3 Respiratory Rate(breaths/min): 16 Photos: [N/A:N/A] Wound Location: Left Groin N/A N/A Wounding Event: Surgical Injury N/A N/A Primary Etiology: Open Surgical Wound N/A  N/A Comorbid History: Osteoarthritis N/A N/A Date Acquired: 05/03/2020 N/A N/A Weeks of Treatment: 4 N/A N/A Wound Status: Healed - Epithelialized N/A N/A Measurements L x W x D (cm) 0x0x0 N/A N/A Area (cm) : 0 N/A N/A Volume (cm) : 0 N/A N/A % Reduction in Area: 100.00% N/A N/A % Reduction in Volume: 100.00% N/A N/A Classification: Full Thickness With Exposed N/A N/A Support Structures Exudate Amount: None Present N/A N/A Wound Margin: Flat and Intact N/A N/A Granulation Amount: None Present (0%) N/A N/A Necrotic Amount: None Present (  0%) N/A N/A Exposed Structures: Fascia: No N/A N/A Fat Layer (Subcutaneous Tissue): No Tendon: No Muscle: No Joint: No Bone: No Epithelialization: Large (67-100%) N/A N/A Treatment Notes Electronic Signature(s) Signed: 06/09/2020 2:10:41 PM By: Phillis Haggis, Dondra Prader RN Entered By: Phillis Haggis, Dondra Prader on 06/09/2020 13:34:20 Sharon [name] ([id number]) -------------------------------------------------------------------------------- Multi-Disciplinary Care Plan Details Patient Name: Sharon [name]. Date of Service: 06/09/2020 1:30 PM Medical Record Number: [id number] Patient Account Number: [id number] Date of Birth/Sex: Nov 21, 1955 (65 y.o. F) Treating RN: Rogers Blocker Primary Care Cylah Fannin: Laroy Apple Other Clinician: Referring Rudy Domek: Laroy Apple Treating Arianna Delsanto/Extender: Rowan Blase in Treatment: 4 Active Inactive Electronic Signature(s) Signed: 06/09/2020 2:10:41 PM By: Phillis Haggis, Dondra Prader RN Entered By: Phillis Haggis, Dondra Prader on 06/09/2020 13:34:06 Sharon [name] ([id number]) -------------------------------------------------------------------------------- Pain Assessment Details Patient Name: Sharon [name]. Date of Service: 06/09/2020 1:30 PM Medical Record Number: [id number] Patient Account Number: [id number] Date of Birth/Sex: 1955/07/22 (65 y.o. F) Treating RN: Rogers Blocker Primary Care  Adreyan Carbajal: Laroy Apple Other Clinician: Referring Tacie Mccuistion: Laroy Apple Treating Amerigo Mcglory/Extender: Rowan Blase in Treatment: 4 Active Problems Location of Pain Severity and Description of Pain Patient Has Paino No Site Locations Rate the pain. Current Pain Level: 0 Pain Management and Medication Current Pain Management: Electronic Signature(s) Signed: 06/09/2020 2:10:41 PM By: Phillis Haggis, Dondra Prader RN Entered By: Phillis Haggis, Dondra Prader on 06/09/2020 13:20:09 Sharon [name] ([id number]) -------------------------------------------------------------------------------- Patient/Caregiver Education Details Patient Name: Sharon [name] Date of Service: 06/09/2020 1:30 PM Medical Record Number: [id number] Patient Account Number: [id number] Date of Birth/Gender: 1956-02-03 (65 y.o. F) Treating RN: Rogers Blocker Primary Care Physician: Laroy Apple Other Clinician: Referring Physician: Laroy Apple Treating Physician/Extender: Rowan Blase in Treatment: 4 Education Assessment Education Provided To: Patient Education Topics Provided Notes educated on management of new skin Electronic Signature(s) Signed: 06/09/2020 2:10:41 PM By: Phillis Haggis, Dondra Prader RN Entered By: Phillis Haggis, Dondra Prader on 06/09/2020 13:36:10 Sharon [name] ([id number]) -------------------------------------------------------------------------------- Wound Assessment Details Patient Name: Sharon [name]. Date of Service: 06/09/2020 1:30 PM Medical Record Number: [id number] Patient Account Number: [id number] Date of Birth/Sex: 06/30/1955 (65 y.o. F) Treating RN: Rogers Blocker Primary Care Uldine Fuster: Laroy Apple Other Clinician: Referring Starlet Gallentine: Laroy Apple Treating Britiany Silbernagel/Extender: Rowan Blase in Treatment: 4 Wound Status Wound Number: 1 Primary Etiology: Open Surgical Wound Wound Location: Left Groin Wound Status: Healed - Epithelialized Wounding Event:  Surgical Injury Comorbid History: Osteoarthritis Date Acquired: 05/03/2020 Weeks Of Treatment: 4 Clustered Wound: No Photos Wound Measurements Length: (cm) 0 Width: (cm) 0 Depth: (cm) 0 Area: (cm) 0 Volume: (cm) 0 % Reduction in Area: 100% % Reduction in Volume: 100% Epithelialization: Large (67-100%) Tunneling: No Undermining: No Wound Description Classification: Full Thickness With Exposed Support Structures Wound Margin: Flat and Intact Exudate Amount: None Present Foul Odor After Cleansing: No Slough/Fibrino No Wound Bed Granulation Amount: None Present (0%) Exposed Structure Necrotic Amount: None Present (0%) Fascia Exposed: No Fat Layer (Subcutaneous Tissue) Exposed: No Tendon Exposed: No Muscle Exposed: No Joint Exposed: No Bone Exposed: No Electronic Signature(s) Signed: 06/09/2020 2:10:41 PM By: Phillis Haggis, Dondra Prader RN Entered By: Phillis Haggis, Kenia on 06/09/2020 13:30:17 Sharon [name] ([id number]) -------------------------------------------------------------------------------- Vitals Details Patient Name: Sharon [name]. Date of Service: 06/09/2020 1:30 PM Medical Record Number: [id number] Patient Account Number: [id number] Date of Birth/Sex: [birth year] (65 y.o. F) Treating RN: Huel Coventry Primary Care Trayon Krantz: Laroy Apple Other Clinician: Referring Sahily Biddle: Laroy Apple Treating Donia Yokum/Extender: Rowan Blase in Treatment: 4 Vital Signs Time Taken: 13:18 Temperature (F): 98.3 Pulse (bpm):  65 Respiratory Rate (breaths/min): 16 Blood Pressure (mmHg): 116/75 Reference Range: 80 - 120 mg / dl Electronic Signature(s) Signed: 06/09/2020 4:14:11 PM By: Dayton Martes RCP, RRT, CHT Entered By: Weyman Rodney, Lucio Edward on 06/09/2020 13:18:28

## 2020-06-14 ENCOUNTER — Telehealth: Payer: Self-pay | Admitting: Orthopaedic Surgery

## 2020-06-14 NOTE — Telephone Encounter (Signed)
Left verbal on VM ?

## 2020-06-14 NOTE — Telephone Encounter (Signed)
Amy with PT would like to get an extension for the pts PT; she would like to do once a week for 1 week and then twice a week for 2 weeks and finally once a week for 2 weeks.  Amy CB# [phone]

## 2020-06-29 ENCOUNTER — Ambulatory Visit: Payer: PPO | Admitting: Orthopaedic Surgery

## 2020-07-05 ENCOUNTER — Other Ambulatory Visit: Payer: Self-pay | Admitting: Family Medicine

## 2020-07-05 DIAGNOSIS — Z9189 Other specified personal risk factors, not elsewhere classified: Secondary | ICD-10-CM | POA: Diagnosis not present

## 2020-07-05 DIAGNOSIS — G894 Chronic pain syndrome: Secondary | ICD-10-CM | POA: Diagnosis not present

## 2020-07-05 DIAGNOSIS — M25552 Pain in left hip: Secondary | ICD-10-CM | POA: Diagnosis not present

## 2020-07-05 DIAGNOSIS — G8929 Other chronic pain: Secondary | ICD-10-CM

## 2020-07-05 DIAGNOSIS — Z79899 Other long term (current) drug therapy: Secondary | ICD-10-CM | POA: Diagnosis not present

## 2020-07-06 ENCOUNTER — Other Ambulatory Visit: Payer: Self-pay

## 2020-07-06 ENCOUNTER — Telehealth (INDEPENDENT_AMBULATORY_CARE_PROVIDER_SITE_OTHER): Payer: PPO | Admitting: Family Medicine

## 2020-07-06 DIAGNOSIS — Z20822 Contact with and (suspected) exposure to covid-19: Secondary | ICD-10-CM | POA: Diagnosis not present

## 2020-07-06 DIAGNOSIS — J0100 Acute maxillary sinusitis, unspecified: Secondary | ICD-10-CM

## 2020-07-06 MED ORDER — CEFPROZIL 500 MG PO TABS: 500.0000 mg | ORAL_TABLET | Freq: Two times a day (BID) | ORAL | 0 refills | Status: DC

## 2020-07-06 NOTE — Progress Notes (Signed)
Patient ID: Sharon [name], female    DOB: December 03, 1955, 65 y.o.   MRN: [id number]  Virtual Visit via Telephone Note  I connected with Sharon [name] on 07/06/20 at  2:10 PM EST by telephone and verified that I am speaking with the correct person using two identifiers.  Location: Patient: home Provider: office   I discussed the limitations, risks, security and privacy concerns of performing an evaluation and management service by telephone and the availability of in person appointments. I also discussed with the patient that there may be a patient responsible charge related to this service. The patient expressed understanding and agreed to proceed.  Chief Complaint  Patient presents with  . Cough   Subjective:    HPI  Patient presents today with respiratory illness Number of days present- 3 weeks  Symptoms include- cough, head congestion and pressure in head, fever, cough, sorethroat, fatigue, no appetite, weak   Presence of worrisome signs (severe shortness of breath, lethargy, etc.) - feeling weak  Recent/current visit to urgent care or ER- none  Recent direct exposure to Covid- yes  Any current Covid testing- covid home test was negative yesterday  Night sweats,  Started 3 wks.  Had exposure to covid by the PT that came in house.  Did home test but was negative, yesterday.   No appetite.  Not eating much.  Drinking fluids. 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 Had open wound for 8 wks. That healed.  Sinuses feel like on "fire." Pt has fever, that is intermittent. Pain and pressure in face.  Lots of drainage with nose and using saline flush.  meds-saline flush, allegra, flonase.   Medical History Sharon [name] has a past medical history of Biliary colic, Cancer ([place]), Carpal tunnel syndrome, Chronic allergic rhinitis, Chronic urticaria, Complication of anesthesia, Degenerative disk disease, Diverticulosis of colon, Fatty liver, Fibrocystic breast disease  (1996), GERD (gastroesophageal reflux disease), History of colon polyps, History of kidney stones, History of melanoma excision, Migraine, Moderate persistent asthma, uncomplicated, OA (osteoarthritis), SUI (stress urinary incontinence, female), and Wears glasses.   Outpatient Encounter Medications as of 07/06/2020  Medication Sig  . albuterol (PROVENTIL HFA;VENTOLIN HFA) 108 (90 Base) MCG/ACT inhaler Inhale 2 puffs into the lungs every 4 (four) hours as needed for wheezing or shortness of breath.  Marland Kitchen albuterol (PROVENTIL) (2.5 MG/3ML) 0.083% nebulizer solution USE ONE VIAL IN NEBULIZER EVERY 4 HOURS AS NEEDED FOR WHEEZING (Patient taking differently: Take 2.5 mg by nebulization every 4 (four) hours as needed for wheezing.)  . Calcium-Vitamin D-Vitamin K (VIACTIV CALCIUM PLUS D) 650-12.5-40 MG-MCG-MCG CHEW Chew 1 tablet by mouth in the morning, at noon, and at bedtime.  . cefPROZIL (CEFZIL) 500 MG tablet Take 1 tablet (500 mg total) by mouth 2 (two) times daily.  Marland Kitchen docusate sodium (COLACE) 100 MG capsule Take 100 mg by mouth 2 (two) times daily as needed for mild constipation.  . fexofenadine-pseudoephedrine (ALLEGRA-D 24) 180-240 MG 24 hr tablet Take 1 tablet by mouth daily.  . fluticasone (FLONASE) 50 MCG/ACT nasal spray Place 2 sprays into both nostrils at bedtime.  Marland Kitchen HYDROcodone-acetaminophen (NORCO) 10-325 MG tablet Take 1 tablet by mouth 3 (three) times daily as needed.  . Lidocaine HCl-Benzyl Alcohol (SALONPAS LIDOCAINE PLUS EX) Place 1 patch onto the skin daily.  . Melatonin 10 MG/ML LIQD Take 10 mg by mouth at bedtime as needed (sleep.).  Marland Kitchen methocarbamol (ROBAXIN) 500 MG tablet TAKE ONE TABLET BY MOUTH EVERY 6 HOURS AS NEEDED FOR  BACK  PAIN (Patient taking differently: Take 500 mg by mouth every 6 (six) hours as needed (back pain.). TAKE ONE TABLET BY MOUTH EVERY 6 HOURS AS NEEDED FOR  BACK  PAI)  . methocarbamol (ROBAXIN) 500 MG tablet Take 1 tablet (500 mg total) by mouth every 6 (six) hours  as needed for muscle spasms.  . Multiple Vitamins-Minerals (BARIATRIC MULTIVITAMINS/IRON PO) Take 1 tablet by mouth daily.  . nabumetone (RELAFEN) 750 MG tablet TAKE 1 TABLET(750 MG) BY MOUTH TWICE DAILY AS NEEDED FOR PAIN (Patient taking differently: Take 750 mg by mouth every 12 (twelve) hours.)  . ondansetron (ZOFRAN-ODT) 8 MG disintegrating tablet DISSOLVE 1 TABLET IN MOUTH EVERY 8 HOURS AS NEEDED FOR NAUSEA OR VOMITING  . pantoprazole (PROTONIX) 40 MG tablet TAKE 1 TABLET BY MOUTH EVERY DAY FOR REFLUX (Patient taking differently: Take 40 mg by mouth every evening.)  . [DISCONTINUED] aspirin 81 MG chewable tablet Chew 1 tablet (81 mg total) by mouth 2 (two) times daily.  . [DISCONTINUED] doxycycline (VIBRA-TABS) 100 MG tablet Take 1 tablet (100 mg total) by mouth 2 (two) times daily.  . [DISCONTINUED] mupirocin ointment (BACTROBAN) 2 % Apply 1 application topically daily.  . [DISCONTINUED] oxyCODONE (OXY IR/ROXICODONE) 5 MG immediate release tablet Take 1-2 tablets (5-10 mg total) by mouth every 4 (four) hours as needed for moderate pain (pain score 4-6).   No facility-administered encounter medications on file as of 07/06/2020.     Review of Systems  Constitutional: Positive for fever (intermittent). Negative for chills.  HENT: Positive for congestion, sinus pressure, sinus pain and sore throat. Negative for ear pain and rhinorrhea.   Eyes: Negative for pain, discharge and itching.  Respiratory: Positive for cough.   Gastrointestinal: Negative for constipation, diarrhea, nausea and vomiting.     Vitals There were no vitals taken for this visit.  Objective:   Physical Exam  No PE due to phone visit.  Assessment and Plan   1. Acute non-recurrent maxillary sinusitis  2. Contact with and (suspected) exposure to covid-19   Pt given cefzil 500mg  bid for 10 days. Cont flonase, decongestant, cough syrup otc. Tylenol prn, inc fluids.  Call in 2-3 days if not improving.  Pt in  agreement.  F/u prn.  Follow Up Instructions:    I discussed the assessment and treatment plan with the patient. The patient was provided an opportunity to ask questions and all were answered. The patient agreed with the plan and demonstrated an understanding of the instructions.   The patient was advised to call back or seek an in-person evaluation if the symptoms worsen or if the condition fails to improve as anticipated.  I provided 15 minutes of non-face-to-face time during this encounter.

## 2020-07-11 ENCOUNTER — Telehealth: Payer: Self-pay | Admitting: Orthopaedic Surgery

## 2020-07-11 DIAGNOSIS — Z6841 Body Mass Index (BMI) 40.0 and over, adult: Secondary | ICD-10-CM | POA: Diagnosis not present

## 2020-07-11 DIAGNOSIS — J4520 Mild intermittent asthma, uncomplicated: Secondary | ICD-10-CM | POA: Diagnosis not present

## 2020-07-11 DIAGNOSIS — Z87891 Personal history of nicotine dependence: Secondary | ICD-10-CM | POA: Diagnosis not present

## 2020-07-11 DIAGNOSIS — M5127 Other intervertebral disc displacement, lumbosacral region: Secondary | ICD-10-CM | POA: Diagnosis not present

## 2020-07-11 DIAGNOSIS — M5137 Other intervertebral disc degeneration, lumbosacral region: Secondary | ICD-10-CM | POA: Diagnosis not present

## 2020-07-11 DIAGNOSIS — Z96642 Presence of left artificial hip joint: Secondary | ICD-10-CM | POA: Diagnosis not present

## 2020-07-11 DIAGNOSIS — N393 Stress incontinence (female) (male): Secondary | ICD-10-CM | POA: Diagnosis not present

## 2020-07-11 DIAGNOSIS — M1611 Unilateral primary osteoarthritis, right hip: Secondary | ICD-10-CM | POA: Diagnosis not present

## 2020-07-11 DIAGNOSIS — Z7982 Long term (current) use of aspirin: Secondary | ICD-10-CM | POA: Diagnosis not present

## 2020-07-11 DIAGNOSIS — M65879 Other synovitis and tenosynovitis, unspecified ankle and foot: Secondary | ICD-10-CM | POA: Diagnosis not present

## 2020-07-11 DIAGNOSIS — K760 Fatty (change of) liver, not elsewhere classified: Secondary | ICD-10-CM | POA: Diagnosis not present

## 2020-07-11 DIAGNOSIS — T8189XD Other complications of procedures, not elsewhere classified, subsequent encounter: Secondary | ICD-10-CM | POA: Diagnosis not present

## 2020-07-11 DIAGNOSIS — M7061 Trochanteric bursitis, right hip: Secondary | ICD-10-CM | POA: Diagnosis not present

## 2020-07-11 DIAGNOSIS — G8929 Other chronic pain: Secondary | ICD-10-CM | POA: Diagnosis not present

## 2020-07-11 DIAGNOSIS — I10 Essential (primary) hypertension: Secondary | ICD-10-CM | POA: Diagnosis not present

## 2020-07-11 DIAGNOSIS — G43909 Migraine, unspecified, not intractable, without status migrainosus: Secondary | ICD-10-CM | POA: Diagnosis not present

## 2020-07-11 DIAGNOSIS — K5790 Diverticulosis of intestine, part unspecified, without perforation or abscess without bleeding: Secondary | ICD-10-CM | POA: Diagnosis not present

## 2020-07-11 DIAGNOSIS — Z79899 Other long term (current) drug therapy: Secondary | ICD-10-CM | POA: Diagnosis not present

## 2020-07-11 DIAGNOSIS — Z87442 Personal history of urinary calculi: Secondary | ICD-10-CM | POA: Diagnosis not present

## 2020-07-11 DIAGNOSIS — E669 Obesity, unspecified: Secondary | ICD-10-CM | POA: Diagnosis not present

## 2020-07-11 DIAGNOSIS — K219 Gastro-esophageal reflux disease without esophagitis: Secondary | ICD-10-CM | POA: Diagnosis not present

## 2020-07-11 DIAGNOSIS — J4540 Moderate persistent asthma, uncomplicated: Secondary | ICD-10-CM | POA: Diagnosis not present

## 2020-07-11 DIAGNOSIS — Z86010 Personal history of colonic polyps: Secondary | ICD-10-CM | POA: Diagnosis not present

## 2020-07-11 DIAGNOSIS — M5410 Radiculopathy, site unspecified: Secondary | ICD-10-CM | POA: Diagnosis not present

## 2020-07-11 DIAGNOSIS — G5603 Carpal tunnel syndrome, bilateral upper limbs: Secondary | ICD-10-CM | POA: Diagnosis not present

## 2020-07-11 NOTE — Telephone Encounter (Signed)
Amy pt therapist is requesting an extension for 2 X a week for 3 weeks. Pt had gotten COVID so they didn't have therapy for like 3 weeks.

## 2020-07-17 DIAGNOSIS — Z87442 Personal history of urinary calculi: Secondary | ICD-10-CM | POA: Diagnosis not present

## 2020-07-17 DIAGNOSIS — T8189XD Other complications of procedures, not elsewhere classified, subsequent encounter: Secondary | ICD-10-CM | POA: Diagnosis not present

## 2020-07-17 DIAGNOSIS — Z96642 Presence of left artificial hip joint: Secondary | ICD-10-CM | POA: Diagnosis not present

## 2020-07-17 DIAGNOSIS — Z86010 Personal history of colonic polyps: Secondary | ICD-10-CM | POA: Diagnosis not present

## 2020-07-17 DIAGNOSIS — Z79899 Other long term (current) drug therapy: Secondary | ICD-10-CM | POA: Diagnosis not present

## 2020-07-17 DIAGNOSIS — M5127 Other intervertebral disc displacement, lumbosacral region: Secondary | ICD-10-CM | POA: Diagnosis not present

## 2020-07-17 DIAGNOSIS — M65879 Other synovitis and tenosynovitis, unspecified ankle and foot: Secondary | ICD-10-CM | POA: Diagnosis not present

## 2020-07-17 DIAGNOSIS — K760 Fatty (change of) liver, not elsewhere classified: Secondary | ICD-10-CM | POA: Diagnosis not present

## 2020-07-17 DIAGNOSIS — M5410 Radiculopathy, site unspecified: Secondary | ICD-10-CM | POA: Diagnosis not present

## 2020-07-17 DIAGNOSIS — M7061 Trochanteric bursitis, right hip: Secondary | ICD-10-CM | POA: Diagnosis not present

## 2020-07-17 DIAGNOSIS — G8929 Other chronic pain: Secondary | ICD-10-CM | POA: Diagnosis not present

## 2020-07-17 DIAGNOSIS — Z87891 Personal history of nicotine dependence: Secondary | ICD-10-CM | POA: Diagnosis not present

## 2020-07-17 DIAGNOSIS — J4520 Mild intermittent asthma, uncomplicated: Secondary | ICD-10-CM | POA: Diagnosis not present

## 2020-07-17 DIAGNOSIS — K5790 Diverticulosis of intestine, part unspecified, without perforation or abscess without bleeding: Secondary | ICD-10-CM | POA: Diagnosis not present

## 2020-07-17 DIAGNOSIS — Z6841 Body Mass Index (BMI) 40.0 and over, adult: Secondary | ICD-10-CM | POA: Diagnosis not present

## 2020-07-17 DIAGNOSIS — M5137 Other intervertebral disc degeneration, lumbosacral region: Secondary | ICD-10-CM | POA: Diagnosis not present

## 2020-07-17 DIAGNOSIS — G43909 Migraine, unspecified, not intractable, without status migrainosus: Secondary | ICD-10-CM | POA: Diagnosis not present

## 2020-07-17 DIAGNOSIS — K219 Gastro-esophageal reflux disease without esophagitis: Secondary | ICD-10-CM | POA: Diagnosis not present

## 2020-07-17 DIAGNOSIS — E669 Obesity, unspecified: Secondary | ICD-10-CM | POA: Diagnosis not present

## 2020-07-17 DIAGNOSIS — M1611 Unilateral primary osteoarthritis, right hip: Secondary | ICD-10-CM | POA: Diagnosis not present

## 2020-07-17 DIAGNOSIS — I10 Essential (primary) hypertension: Secondary | ICD-10-CM | POA: Diagnosis not present

## 2020-07-17 DIAGNOSIS — N393 Stress incontinence (female) (male): Secondary | ICD-10-CM | POA: Diagnosis not present

## 2020-07-17 DIAGNOSIS — G5603 Carpal tunnel syndrome, bilateral upper limbs: Secondary | ICD-10-CM | POA: Diagnosis not present

## 2020-07-17 DIAGNOSIS — Z7982 Long term (current) use of aspirin: Secondary | ICD-10-CM | POA: Diagnosis not present

## 2020-07-17 DIAGNOSIS — J4540 Moderate persistent asthma, uncomplicated: Secondary | ICD-10-CM | POA: Diagnosis not present

## 2020-07-19 ENCOUNTER — Ambulatory Visit (INDEPENDENT_AMBULATORY_CARE_PROVIDER_SITE_OTHER): Payer: PPO | Admitting: Physician Assistant

## 2020-07-19 ENCOUNTER — Encounter: Payer: Self-pay | Admitting: Physician Assistant

## 2020-07-19 DIAGNOSIS — Z96642 Presence of left artificial hip joint: Secondary | ICD-10-CM

## 2020-07-19 DIAGNOSIS — G8929 Other chronic pain: Secondary | ICD-10-CM

## 2020-07-19 DIAGNOSIS — M533 Sacrococcygeal disorders, not elsewhere classified: Secondary | ICD-10-CM

## 2020-07-19 NOTE — Progress Notes (Signed)
HPI: Sharon [name] returns today just over 3 months status post left total hip arthroplasty.  She states her incisions healed well.  Her main complaint now is pain in her pelvis area.  She points in the right hand over her SI joint region whenever she says this.  She had no real groin pain.  She is ambulating with a cane.  She states due to the pain in her pelvis area that she continues to take Vicodin.  States that she wants to get off of the Vicodin.  She denies any numbness tingling down her leg.  She has had previous back injections and states that this pain is not back like pain she keeps: It pelvic pain.  Review of systems: Please see HPI otherwise negative  Physical exam: Left hip good range of motion without pain.  Surgical incisions well-healed no signs of infection.  Tenderness over the SI joint region.  Faber's causes pain in her SI joint region.  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  We will set this up with Dr. Ernestina [name].  We will see her back in May for follow-up of her SI joint pain and also 54-month follow-up from undergoing left total hip arthroplasty.  Will obtain AP pelvis and lateral view of the left hip at that time.  Questions were encouraged and answered at length.

## 2020-07-21 ENCOUNTER — Telehealth: Payer: Self-pay | Admitting: Physical Medicine and Rehabilitation

## 2020-07-21 NOTE — Telephone Encounter (Signed)
Scheduled

## 2020-07-21 NOTE — Telephone Encounter (Signed)
Pts daughter Stanton Kidney missed a call to set an appt for the pt; she would like to be tried again.   [phone]

## 2020-07-25 DIAGNOSIS — T8189XD Other complications of procedures, not elsewhere classified, subsequent encounter: Secondary | ICD-10-CM | POA: Diagnosis not present

## 2020-07-25 DIAGNOSIS — M5410 Radiculopathy, site unspecified: Secondary | ICD-10-CM | POA: Diagnosis not present

## 2020-07-25 DIAGNOSIS — I10 Essential (primary) hypertension: Secondary | ICD-10-CM | POA: Diagnosis not present

## 2020-07-25 DIAGNOSIS — M1611 Unilateral primary osteoarthritis, right hip: Secondary | ICD-10-CM | POA: Diagnosis not present

## 2020-07-25 DIAGNOSIS — M5127 Other intervertebral disc displacement, lumbosacral region: Secondary | ICD-10-CM | POA: Diagnosis not present

## 2020-07-25 DIAGNOSIS — Z96642 Presence of left artificial hip joint: Secondary | ICD-10-CM | POA: Diagnosis not present

## 2020-07-25 DIAGNOSIS — Z79899 Other long term (current) drug therapy: Secondary | ICD-10-CM | POA: Diagnosis not present

## 2020-07-25 DIAGNOSIS — Z7982 Long term (current) use of aspirin: Secondary | ICD-10-CM | POA: Diagnosis not present

## 2020-07-25 DIAGNOSIS — M5137 Other intervertebral disc degeneration, lumbosacral region: Secondary | ICD-10-CM | POA: Diagnosis not present

## 2020-07-25 DIAGNOSIS — G43909 Migraine, unspecified, not intractable, without status migrainosus: Secondary | ICD-10-CM | POA: Diagnosis not present

## 2020-07-25 DIAGNOSIS — N393 Stress incontinence (female) (male): Secondary | ICD-10-CM | POA: Diagnosis not present

## 2020-07-25 DIAGNOSIS — G8929 Other chronic pain: Secondary | ICD-10-CM | POA: Diagnosis not present

## 2020-07-25 DIAGNOSIS — Z86010 Personal history of colonic polyps: Secondary | ICD-10-CM | POA: Diagnosis not present

## 2020-07-25 DIAGNOSIS — J4520 Mild intermittent asthma, uncomplicated: Secondary | ICD-10-CM | POA: Diagnosis not present

## 2020-07-25 DIAGNOSIS — Z87891 Personal history of nicotine dependence: Secondary | ICD-10-CM | POA: Diagnosis not present

## 2020-07-25 DIAGNOSIS — J4540 Moderate persistent asthma, uncomplicated: Secondary | ICD-10-CM | POA: Diagnosis not present

## 2020-07-25 DIAGNOSIS — K219 Gastro-esophageal reflux disease without esophagitis: Secondary | ICD-10-CM | POA: Diagnosis not present

## 2020-07-25 DIAGNOSIS — E669 Obesity, unspecified: Secondary | ICD-10-CM | POA: Diagnosis not present

## 2020-07-25 DIAGNOSIS — Z6841 Body Mass Index (BMI) 40.0 and over, adult: Secondary | ICD-10-CM | POA: Diagnosis not present

## 2020-07-25 DIAGNOSIS — K5790 Diverticulosis of intestine, part unspecified, without perforation or abscess without bleeding: Secondary | ICD-10-CM | POA: Diagnosis not present

## 2020-07-25 DIAGNOSIS — G5603 Carpal tunnel syndrome, bilateral upper limbs: Secondary | ICD-10-CM | POA: Diagnosis not present

## 2020-07-25 DIAGNOSIS — M65879 Other synovitis and tenosynovitis, unspecified ankle and foot: Secondary | ICD-10-CM | POA: Diagnosis not present

## 2020-07-25 DIAGNOSIS — M7061 Trochanteric bursitis, right hip: Secondary | ICD-10-CM | POA: Diagnosis not present

## 2020-07-25 DIAGNOSIS — Z87442 Personal history of urinary calculi: Secondary | ICD-10-CM | POA: Diagnosis not present

## 2020-07-25 DIAGNOSIS — K760 Fatty (change of) liver, not elsewhere classified: Secondary | ICD-10-CM | POA: Diagnosis not present

## 2020-07-26 ENCOUNTER — Telehealth: Payer: Self-pay | Admitting: Family Medicine

## 2020-07-26 NOTE — Telephone Encounter (Signed)
What is reason pt is homebound?  When can she come for appt?  Does she have headache, blurry vision chest pain or leg swelling?  Thx.  Dr. Lovena Le

## 2020-07-26 NOTE — Telephone Encounter (Signed)
Patient had total hip replacement 03/2020 with a lot of complications Patient stated she is having some headaches but patient states her blood pressure has been running high since recent viral illness 140/70 this am- patient states she is also having a lot of SI joint pain esp with PT- she is having it injected Friday Patient states she is homebound and not able to drive

## 2020-07-26 NOTE — Telephone Encounter (Signed)
Ok, in order to assess her bp and discuss the concerns needs to be seen in office.  Could be her bp up if she is in pain also.   D.r Desmon Hitchner

## 2020-07-26 NOTE — Telephone Encounter (Signed)
Sharon [name] Physical therapist [name] went to see patient yesterday and she had elevated blood pressure 182/90 and moving around its 184/94. I called patient this morning to give her appointment today to check BP but she couldn't come states that she is home bound. I explained to her she would need to be seen to check her BP.

## 2020-07-28 NOTE — Telephone Encounter (Signed)
Needs office visit. Please schedule.

## 2020-07-31 ENCOUNTER — Ambulatory Visit: Payer: Self-pay

## 2020-07-31 ENCOUNTER — Ambulatory Visit: Payer: PPO | Admitting: Physical Medicine and Rehabilitation

## 2020-07-31 ENCOUNTER — Other Ambulatory Visit: Payer: Self-pay

## 2020-07-31 ENCOUNTER — Encounter: Payer: Self-pay | Admitting: Physical Medicine and Rehabilitation

## 2020-07-31 DIAGNOSIS — M461 Sacroiliitis, not elsewhere classified: Secondary | ICD-10-CM

## 2020-07-31 NOTE — Progress Notes (Signed)
Pt state left hip pain. Pt state sitting for a long time makes the pain worse. Pt state she hasn't walked with her wedge shoe for a few weeks that help with her walking. Pt state she takes over the counter meds and heat and ice to help ease the a  Numeric Pain Rating Scale and Functional Assessment Average Pain 6   In the last MONTH (on 0-10 scale) has pain interfered with the following?  1. General activity like being  able to carry out your everyday physical activities such as walking, climbing stairs, carrying groceries, or moving a chair?  Rating(8)

## 2020-07-31 NOTE — Progress Notes (Signed)
Sharon [name] - 65 y.o. female MRN [id number]  Date of birth: November 21, 1955  Office Visit Note: Visit Date: 07/31/2020 PCP: Erven Colla, DO Referred by: Erven Colla, DO  Subjective: Chief Complaint  Patient presents with  . Left Hip - Pain   HPI:  Sharon [name] is a 65 y.o. female who comes in today For planned diagnostic and hopefully therapeutic left sacroiliac joint injection with fluoroscopic guidance at the request of Benita Stabile, P.A.-C.  Patient has tried and failed conservative care including medication management and therapy and time.  She has undergone hip replacement with continued left posterior hip pain.  Hip replacement did help her other pain that was more posterior lateral and into the groin.  She has history of lumbar spine issues as well.  ROS Otherwise per HPI.  Assessment & Plan: Visit Diagnoses:    ICD-10-CM   1. Sacroiliitis (HCC)  M46.1 Sacroiliac Joint Inj    XR C-ARM NO REPORT    Plan: No additional findings.   Meds & Orders: No orders of the defined types were placed in this encounter.   Orders Placed This Encounter  Procedures  . Sacroiliac Joint Inj  . XR C-ARM NO REPORT    Follow-up: Return in about 2 weeks (around 08/14/2020) for Benita Stabile, P.A.-C.   Procedures: Sacroiliac Joint Inj on 07/31/2020 10:54 AM Indications: pain and diagnostic evaluation Details: 22 G 3.5 in needle, fluoroscopy-guided posterior approach Medications: 2 mL bupivacaine 0.5 %; 80 mg methylPREDNISolone acetate 80 MG/ML Outcome: tolerated well, no immediate complications  There was excellent flow of contrast producing a partial arthrogram of the sacroiliac joint.  Procedure, treatment alternatives, risks and benefits explained, specific risks discussed. Consent was given by the patient. Immediately prior to procedure a time out was called to verify the correct patient, procedure, equipment, support staff and site/side marked as required. Patient was prepped and  draped in the usual sterile fashion.          Clinical History: MRI LUMBAR SPINE WITHOUT CONTRAST  TECHNIQUE: Multiplanar, multisequence MR imaging of the lumbar spine was performed. No intravenous contrast was administered.  COMPARISON:  10/13/2012  FINDINGS: Segmentation:  Standard.  Alignment:  Normal.  Vertebrae: No fracture, suspicious osseous lesion, or significant marrow edema. Mild chronic degenerative endplate changes at Y5-K3.  Conus medullaris and cauda equina: Conus extends to the L1-2 level. Conus and cauda equina appear normal.  Paraspinal and other soft tissues: Unremarkable.  Disc levels:  Disc desiccation throughout the lumbar spine. Mild disc space narrowing at L3-4 and L5-S1.  T12-L1: Mild facet arthrosis without disc herniation or stenosis, unchanged.  L1-2: Mild facet arthrosis without disc herniation or stenosis, unchanged.  L2-3: Mild-to-moderate right and mild left facet arthrosis and minimal disc bulging without stenosis, unchanged.  L3-4: Mild disc bulging and mild-to-moderate facet arthrosis, slightly progressed but without stenosis.  L4-5: Mild disc bulging and mild-to-moderate facet arthrosis result in borderline to mild left lateral recess stenosis without spinal or neural foraminal stenosis, stable to at most minimally progressed.  L5-S1: Disc bulging greater to the left and mild facet arthrosis result in new mild left lateral recess stenosis without significant spinal or neural foraminal stenosis.  IMPRESSION: Slight progression of lumbar disc and facet degeneration without high-grade stenosis. Mild left lateral recess stenosis at L4-5 and L5-S1.   Electronically Signed   By: Logan Bores M.D.   On: 10/26/2017 14:09     Objective:  VS:  HT:  WT:   BMI:     BP:   HR: bpm  TEMP: ( )  RESP:  Physical Exam Vitals and nursing note reviewed.  Constitutional:      General: She is not in acute  distress.    Appearance: Normal appearance. She is not ill-appearing.  HENT:     Head: Normocephalic and atraumatic.     Right Ear: External ear normal.     Left Ear: External ear normal.  Eyes:     Extraocular Movements: Extraocular movements intact.  Cardiovascular:     Rate and Rhythm: Normal rate.     Pulses: Normal pulses.  Pulmonary:     Effort: Pulmonary effort is normal. No respiratory distress.  Abdominal:     General: There is no distension.     Palpations: Abdomen is soft.  Musculoskeletal:        General: Tenderness present.     Cervical back: Neck supple.     Right lower leg: No edema.     Left lower leg: No edema.     Comments: Patient has good distal strength with no pain over the greater trochanters.  No clonus or focal weakness.  Skin:    Findings: No erythema, lesion or rash.  Neurological:     General: No focal deficit present.     Mental Status: She is alert and oriented to person, place, and time.     Sensory: No sensory deficit.     Motor: No weakness or abnormal muscle tone.     Coordination: Coordination normal.  Psychiatric:        Mood and Affect: Mood normal.        Behavior: Behavior normal.      Imaging: XR C-ARM NO REPORT  Result Date: 07/31/2020 Please see Notes tab for imaging impression.

## 2020-08-01 MED ORDER — METHYLPREDNISOLONE ACETATE 80 MG/ML IJ SUSP
80.0000 mg | INTRAMUSCULAR | Status: AC | PRN
  Administered 2020-07-31: 80 mg via INTRA_ARTICULAR

## 2020-08-01 MED ORDER — BUPIVACAINE HCL 0.5 % IJ SOLN
2.0000 mL | INTRAMUSCULAR | Status: AC | PRN
  Administered 2020-07-31: 2 mL via INTRA_ARTICULAR

## 2020-08-02 ENCOUNTER — Ambulatory Visit: Payer: PPO | Admitting: Family Medicine

## 2020-08-03 DIAGNOSIS — M1611 Unilateral primary osteoarthritis, right hip: Secondary | ICD-10-CM | POA: Diagnosis not present

## 2020-08-03 DIAGNOSIS — T8189XD Other complications of procedures, not elsewhere classified, subsequent encounter: Secondary | ICD-10-CM | POA: Diagnosis not present

## 2020-08-03 DIAGNOSIS — Z6841 Body Mass Index (BMI) 40.0 and over, adult: Secondary | ICD-10-CM | POA: Diagnosis not present

## 2020-08-03 DIAGNOSIS — Z87891 Personal history of nicotine dependence: Secondary | ICD-10-CM | POA: Diagnosis not present

## 2020-08-03 DIAGNOSIS — K760 Fatty (change of) liver, not elsewhere classified: Secondary | ICD-10-CM | POA: Diagnosis not present

## 2020-08-03 DIAGNOSIS — G43909 Migraine, unspecified, not intractable, without status migrainosus: Secondary | ICD-10-CM | POA: Diagnosis not present

## 2020-08-03 DIAGNOSIS — N393 Stress incontinence (female) (male): Secondary | ICD-10-CM | POA: Diagnosis not present

## 2020-08-03 DIAGNOSIS — Z96642 Presence of left artificial hip joint: Secondary | ICD-10-CM | POA: Diagnosis not present

## 2020-08-03 DIAGNOSIS — E669 Obesity, unspecified: Secondary | ICD-10-CM | POA: Diagnosis not present

## 2020-08-03 DIAGNOSIS — M5137 Other intervertebral disc degeneration, lumbosacral region: Secondary | ICD-10-CM | POA: Diagnosis not present

## 2020-08-03 DIAGNOSIS — Z86010 Personal history of colonic polyps: Secondary | ICD-10-CM | POA: Diagnosis not present

## 2020-08-03 DIAGNOSIS — Z79899 Other long term (current) drug therapy: Secondary | ICD-10-CM | POA: Diagnosis not present

## 2020-08-03 DIAGNOSIS — Z87442 Personal history of urinary calculi: Secondary | ICD-10-CM | POA: Diagnosis not present

## 2020-08-03 DIAGNOSIS — Z7982 Long term (current) use of aspirin: Secondary | ICD-10-CM | POA: Diagnosis not present

## 2020-08-03 DIAGNOSIS — I10 Essential (primary) hypertension: Secondary | ICD-10-CM | POA: Diagnosis not present

## 2020-08-03 DIAGNOSIS — M65879 Other synovitis and tenosynovitis, unspecified ankle and foot: Secondary | ICD-10-CM | POA: Diagnosis not present

## 2020-08-03 DIAGNOSIS — M5410 Radiculopathy, site unspecified: Secondary | ICD-10-CM | POA: Diagnosis not present

## 2020-08-03 DIAGNOSIS — M5127 Other intervertebral disc displacement, lumbosacral region: Secondary | ICD-10-CM | POA: Diagnosis not present

## 2020-08-03 DIAGNOSIS — J4540 Moderate persistent asthma, uncomplicated: Secondary | ICD-10-CM | POA: Diagnosis not present

## 2020-08-03 DIAGNOSIS — G8929 Other chronic pain: Secondary | ICD-10-CM | POA: Diagnosis not present

## 2020-08-03 DIAGNOSIS — M7061 Trochanteric bursitis, right hip: Secondary | ICD-10-CM | POA: Diagnosis not present

## 2020-08-03 DIAGNOSIS — G5603 Carpal tunnel syndrome, bilateral upper limbs: Secondary | ICD-10-CM | POA: Diagnosis not present

## 2020-08-03 DIAGNOSIS — J4520 Mild intermittent asthma, uncomplicated: Secondary | ICD-10-CM | POA: Diagnosis not present

## 2020-08-03 DIAGNOSIS — K5790 Diverticulosis of intestine, part unspecified, without perforation or abscess without bleeding: Secondary | ICD-10-CM | POA: Diagnosis not present

## 2020-08-03 DIAGNOSIS — K219 Gastro-esophageal reflux disease without esophagitis: Secondary | ICD-10-CM | POA: Diagnosis not present

## 2020-08-04 ENCOUNTER — Telehealth: Payer: Self-pay

## 2020-08-04 NOTE — Telephone Encounter (Signed)
Extend pt for 2 a week for 2 weeks

## 2020-08-07 NOTE — Telephone Encounter (Signed)
Verbal order given  

## 2020-08-08 ENCOUNTER — Other Ambulatory Visit: Payer: Self-pay | Admitting: Orthopaedic Surgery

## 2020-08-09 DIAGNOSIS — Z6841 Body Mass Index (BMI) 40.0 and over, adult: Secondary | ICD-10-CM | POA: Diagnosis not present

## 2020-08-09 DIAGNOSIS — G894 Chronic pain syndrome: Secondary | ICD-10-CM | POA: Diagnosis not present

## 2020-08-09 DIAGNOSIS — M25552 Pain in left hip: Secondary | ICD-10-CM | POA: Diagnosis not present

## 2020-08-09 DIAGNOSIS — Z79899 Other long term (current) drug therapy: Secondary | ICD-10-CM | POA: Diagnosis not present

## 2020-08-14 ENCOUNTER — Ambulatory Visit: Payer: PPO | Admitting: Physician Assistant

## 2020-08-14 ENCOUNTER — Ambulatory Visit (INDEPENDENT_AMBULATORY_CARE_PROVIDER_SITE_OTHER): Payer: PPO

## 2020-08-14 ENCOUNTER — Encounter: Payer: Self-pay | Admitting: Physician Assistant

## 2020-08-14 DIAGNOSIS — Z96642 Presence of left artificial hip joint: Secondary | ICD-10-CM | POA: Diagnosis not present

## 2020-08-14 NOTE — Progress Notes (Signed)
HPI: Sharon [name] returns today follow-up her left hip and low back pain.  She went for an SI joint injection with Dr. Ernestina Patches on 07/31/2020 states this helped tremendously with her low back pain.  She is having no pain in the left hip at this point time.  Again she does have a leg length discrepancy is having to use a lift on the right shoe.  Review of systems: See HPI otherwise negative  Physical exam: Left hip excellent range of motion without pain.  She ambulates with cane with a nonantalgic gait.  Radiographs:AP pelvis lateral view left hip: No acute fractures.  Bilateral hips well located.  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

## 2020-08-16 DIAGNOSIS — Z6841 Body Mass Index (BMI) 40.0 and over, adult: Secondary | ICD-10-CM | POA: Diagnosis not present

## 2020-08-16 DIAGNOSIS — J4540 Moderate persistent asthma, uncomplicated: Secondary | ICD-10-CM | POA: Diagnosis not present

## 2020-08-16 DIAGNOSIS — M5137 Other intervertebral disc degeneration, lumbosacral region: Secondary | ICD-10-CM | POA: Diagnosis not present

## 2020-08-16 DIAGNOSIS — K219 Gastro-esophageal reflux disease without esophagitis: Secondary | ICD-10-CM | POA: Diagnosis not present

## 2020-08-16 DIAGNOSIS — E669 Obesity, unspecified: Secondary | ICD-10-CM | POA: Diagnosis not present

## 2020-08-16 DIAGNOSIS — Z86010 Personal history of colonic polyps: Secondary | ICD-10-CM | POA: Diagnosis not present

## 2020-08-16 DIAGNOSIS — M5127 Other intervertebral disc displacement, lumbosacral region: Secondary | ICD-10-CM | POA: Diagnosis not present

## 2020-08-16 DIAGNOSIS — G8929 Other chronic pain: Secondary | ICD-10-CM | POA: Diagnosis not present

## 2020-08-16 DIAGNOSIS — M5410 Radiculopathy, site unspecified: Secondary | ICD-10-CM | POA: Diagnosis not present

## 2020-08-16 DIAGNOSIS — M7061 Trochanteric bursitis, right hip: Secondary | ICD-10-CM | POA: Diagnosis not present

## 2020-08-16 DIAGNOSIS — J4520 Mild intermittent asthma, uncomplicated: Secondary | ICD-10-CM | POA: Diagnosis not present

## 2020-08-16 DIAGNOSIS — Z79899 Other long term (current) drug therapy: Secondary | ICD-10-CM | POA: Diagnosis not present

## 2020-08-16 DIAGNOSIS — K760 Fatty (change of) liver, not elsewhere classified: Secondary | ICD-10-CM | POA: Diagnosis not present

## 2020-08-16 DIAGNOSIS — Z96642 Presence of left artificial hip joint: Secondary | ICD-10-CM | POA: Diagnosis not present

## 2020-08-16 DIAGNOSIS — G43909 Migraine, unspecified, not intractable, without status migrainosus: Secondary | ICD-10-CM | POA: Diagnosis not present

## 2020-08-16 DIAGNOSIS — T8189XD Other complications of procedures, not elsewhere classified, subsequent encounter: Secondary | ICD-10-CM | POA: Diagnosis not present

## 2020-08-16 DIAGNOSIS — Z7982 Long term (current) use of aspirin: Secondary | ICD-10-CM | POA: Diagnosis not present

## 2020-08-16 DIAGNOSIS — G5603 Carpal tunnel syndrome, bilateral upper limbs: Secondary | ICD-10-CM | POA: Diagnosis not present

## 2020-08-16 DIAGNOSIS — Z87442 Personal history of urinary calculi: Secondary | ICD-10-CM | POA: Diagnosis not present

## 2020-08-16 DIAGNOSIS — Z87891 Personal history of nicotine dependence: Secondary | ICD-10-CM | POA: Diagnosis not present

## 2020-08-16 DIAGNOSIS — M65879 Other synovitis and tenosynovitis, unspecified ankle and foot: Secondary | ICD-10-CM | POA: Diagnosis not present

## 2020-08-16 DIAGNOSIS — I10 Essential (primary) hypertension: Secondary | ICD-10-CM | POA: Diagnosis not present

## 2020-08-16 DIAGNOSIS — K5790 Diverticulosis of intestine, part unspecified, without perforation or abscess without bleeding: Secondary | ICD-10-CM | POA: Diagnosis not present

## 2020-08-16 DIAGNOSIS — M1611 Unilateral primary osteoarthritis, right hip: Secondary | ICD-10-CM | POA: Diagnosis not present

## 2020-08-16 DIAGNOSIS — N393 Stress incontinence (female) (male): Secondary | ICD-10-CM | POA: Diagnosis not present

## 2020-08-18 ENCOUNTER — Encounter: Payer: Self-pay | Admitting: Family Medicine

## 2020-08-18 ENCOUNTER — Other Ambulatory Visit: Payer: Self-pay

## 2020-08-18 ENCOUNTER — Ambulatory Visit (INDEPENDENT_AMBULATORY_CARE_PROVIDER_SITE_OTHER): Payer: PPO | Admitting: Family Medicine

## 2020-08-18 VITALS — BP 158/94 | HR 81 | Temp 97.9°F | Ht 60.0 in | Wt 216.0 lb

## 2020-08-18 DIAGNOSIS — I10 Essential (primary) hypertension: Secondary | ICD-10-CM

## 2020-08-18 MED ORDER — AMLODIPINE BESYLATE 5 MG PO TABS: 5.0000 mg | ORAL_TABLET | Freq: Every day | ORAL | 4 refills | Status: DC

## 2020-08-18 NOTE — Progress Notes (Signed)
   Subjective:    Patient ID: Sharon [name], female    DOB: Feb 27, 1956, 65 y.o.   MRN: [id number]  HPIelevated bp. Pt states pt faxed over several bp readings that were elevated. Last one was 180/100. Pt states it has been as high as 210/100.  Significant elevation of blood pressure over the past several weeks Denies any chest tightness pressure pain shortness of breath No swelling in the legs Recently had hip surgery and is improving with this Does take anti-inflammatories but she states in the past these never caused her to have high blood pressure   Review of Systems  Constitutional: Negative for activity change, fatigue and fever.  HENT: Negative for congestion and rhinorrhea.   Respiratory: Negative for cough, chest tightness and shortness of breath.   Cardiovascular: Negative for chest pain and leg swelling.  Gastrointestinal: Negative for abdominal pain and nausea.  Skin: Negative for color change.  Neurological: Negative for dizziness and headaches.  Psychiatric/Behavioral: Negative for agitation and behavioral problems.       Objective:   Physical Exam Vitals reviewed.  Constitutional:      General: She is not in acute distress. HENT:     Head: Normocephalic.  Cardiovascular:     Rate and Rhythm: Normal rate and regular rhythm.     Heart sounds: Normal heart sounds. No murmur heard.   Pulmonary:     Effort: Pulmonary effort is normal.     Breath sounds: Normal breath sounds.  Lymphadenopathy:     Cervical: No cervical adenopathy.  Neurological:     Mental Status: She is alert.  Psychiatric:        Behavior: Behavior normal.    Best reading was 158/94       Assessment & Plan:  HTN subpar control Start amlodipine 5 mg daily Patient is a self monitor her keep [place] posted Follow-up within 2 weeks May well need to be on multiple medicines Reduce NSAIDs to 1/day Minimize salt Stay away from decongestants Check labs before next visit

## 2020-08-18 NOTE — Patient Instructions (Signed)

## 2020-08-29 ENCOUNTER — Other Ambulatory Visit: Payer: Self-pay | Admitting: *Deleted

## 2020-08-29 DIAGNOSIS — I10 Essential (primary) hypertension: Secondary | ICD-10-CM | POA: Diagnosis not present

## 2020-08-29 NOTE — Telephone Encounter (Signed)
Patient came in to report she is having her labs drawn this am

## 2020-09-01 ENCOUNTER — Encounter: Payer: Self-pay | Admitting: Family Medicine

## 2020-09-01 ENCOUNTER — Other Ambulatory Visit: Payer: Self-pay

## 2020-09-01 ENCOUNTER — Ambulatory Visit (INDEPENDENT_AMBULATORY_CARE_PROVIDER_SITE_OTHER): Payer: PPO | Admitting: Family Medicine

## 2020-09-01 VITALS — BP 143/83 | HR 82 | Temp 97.3°F | Ht 60.0 in | Wt 214.0 lb

## 2020-09-01 DIAGNOSIS — R69 Illness, unspecified: Secondary | ICD-10-CM | POA: Diagnosis not present

## 2020-09-01 DIAGNOSIS — Z9884 Bariatric surgery status: Secondary | ICD-10-CM | POA: Diagnosis not present

## 2020-09-01 DIAGNOSIS — I10 Essential (primary) hypertension: Secondary | ICD-10-CM | POA: Diagnosis not present

## 2020-09-01 DIAGNOSIS — U099 Post covid-19 condition, unspecified: Secondary | ICD-10-CM

## 2020-09-01 DIAGNOSIS — K912 Postsurgical malabsorption, not elsewhere classified: Secondary | ICD-10-CM | POA: Diagnosis not present

## 2020-09-01 DIAGNOSIS — R002 Palpitations: Secondary | ICD-10-CM | POA: Diagnosis not present

## 2020-09-01 DIAGNOSIS — E669 Obesity, unspecified: Secondary | ICD-10-CM | POA: Diagnosis not present

## 2020-09-01 MED ORDER — AMLODIPINE BESYLATE 10 MG PO TABS: 10.0000 mg | ORAL_TABLET | Freq: Every day | ORAL | 1 refills | Status: DC

## 2020-09-01 NOTE — Progress Notes (Addendum)
Patient ID: Sharon [name], female    DOB: [birth year], 65 y.o.   MRN: [id number]   Chief Complaint  Patient presents with  . Hypertension        Subjective:  CC: follow-up on hypertension  This is not a new problem.  Presents today to follow-up on high blood pressure.  Was seen by Dr. Sallee [name] on March 25, started on amlodipine 5 mg at that time.  Has been monitoring blood pressures at home, did not bring in log today reports yesterday blood pressure was 130/72 today 140/78.  It was 143/83 in the office.  Does have issues with chronic pain, had hip replacement done in November 2021, has completed physical therapy, watches her diet, and is trying to walk daily now.  Labs done on April 5, GFR 96.  Has a history of gastric sleeve sees a registered dietitian regularly.  Reports that in February 2022, got an infection, thought it was Covid, did not get tested.  Has had blood pressure issues since that time, and heart palpitations.  Denies chest pain shortness of breath.   follow up on blood pressure and lab work.    Medical History Sharon [name] has a past medical history of Biliary colic, Cancer ([place]), Carpal tunnel syndrome, Chronic allergic rhinitis, Chronic urticaria, Complication of anesthesia, Degenerative disk disease, Diverticulosis of colon, Fatty liver, Fibrocystic breast disease (1996), GERD (gastroesophageal reflux disease), History of colon polyps, History of kidney stones, History of melanoma excision, Migraine, Moderate persistent asthma, uncomplicated, OA (osteoarthritis), SUI (stress urinary incontinence, female), and Wears glasses.   Outpatient Encounter Medications as of 09/01/2020  Medication Sig  . albuterol (PROVENTIL HFA;VENTOLIN HFA) 108 (90 Base) MCG/ACT inhaler Inhale 2 puffs into the lungs every 4 (four) hours as needed for wheezing or shortness of breath.  Marland Kitchen albuterol (PROVENTIL) (2.5 MG/3ML) 0.083% nebulizer solution USE ONE VIAL IN NEBULIZER EVERY 4 HOURS AS NEEDED FOR  WHEEZING (Patient taking differently: Take 2.5 mg by nebulization every 4 (four) hours as needed for wheezing.)  . amLODipine (NORVASC) 10 MG tablet Take 1 tablet (10 mg total) by mouth daily.  . Calcium-Vitamin D-Vitamin K (VIACTIV CALCIUM PLUS D) 650-12.5-40 MG-MCG-MCG CHEW Chew 1 tablet by mouth in the morning, at noon, and at bedtime.  . docusate sodium (COLACE) 100 MG capsule Take 100 mg by mouth 2 (two) times daily as needed for mild constipation.  . DULoxetine (CYMBALTA) 30 MG capsule Take 60 mg by mouth daily.  . fexofenadine (ALLEGRA) 180 MG tablet Take 180 mg by mouth daily.  . fluticasone (FLONASE) 50 MCG/ACT nasal spray Place 2 sprays into both nostrils at bedtime.  Marland Kitchen HYDROcodone-acetaminophen (NORCO) 10-325 MG tablet Take 1 tablet by mouth 3 (three) times daily as needed.  . Lidocaine HCl-Benzyl Alcohol (SALONPAS LIDOCAINE PLUS EX) Place 1 patch onto the skin daily.  . Melatonin 10 MG/ML LIQD Take 10 mg by mouth at bedtime as needed (sleep.).  Marland Kitchen methocarbamol (ROBAXIN) 500 MG tablet TAKE 1 TABLET(500 MG) BY MOUTH EVERY 6 HOURS AS NEEDED FOR MUSCLE SPASMS  . Multiple Vitamins-Minerals (BARIATRIC MULTIVITAMINS/IRON PO) Take 1 tablet by mouth daily.  . nabumetone (RELAFEN) 750 MG tablet TAKE 1 TABLET(750 MG) BY MOUTH TWICE DAILY AS NEEDED FOR PAIN  . ondansetron (ZOFRAN-ODT) 8 MG disintegrating tablet DISSOLVE 1 TABLET IN MOUTH EVERY 8 HOURS AS NEEDED FOR NAUSEA OR VOMITING  . pantoprazole (PROTONIX) 40 MG tablet TAKE 1 TABLET BY MOUTH EVERY DAY FOR REFLUX  . [DISCONTINUED] amLODipine (NORVASC) 5 MG  tablet Take 1 tablet (5 mg total) by mouth daily.   No facility-administered encounter medications on file as of 09/01/2020.     Review of Systems  Eyes: Negative for visual disturbance.  Respiratory: Negative for shortness of breath.   Cardiovascular: Positive for palpitations. Negative for chest pain.       Feels flutter in chest with exertion.   Gastrointestinal: Negative for  abdominal pain.  Neurological: Positive for headaches.       Feels headache with blood pressure.      Vitals BP (!) 143/83   Pulse 82   Temp (!) 97.3 F (36.3 C)   Ht 5' (1.524 m)   Wt 214 lb (97.1 kg)   SpO2 95%   BMI 41.79 kg/m   Objective:   Physical Exam Vitals reviewed.  Constitutional:      Appearance: Normal appearance.  Cardiovascular:     Rate and Rhythm: Normal rate and regular rhythm.     Heart sounds: Normal heart sounds.     Comments: Regular/irregular rhythm. Having palpitations since Covid infection.  Pulmonary:     Effort: Pulmonary effort is normal.     Breath sounds: Normal breath sounds.  Skin:    General: Skin is warm and dry.  Neurological:     General: No focal deficit present.     Mental Status: She is alert.  Psychiatric:        Behavior: Behavior normal.      Assessment and Plan   1. Elevated blood pressure reading in office with diagnosis of hypertension - amLODipine (NORVASC) 10 MG tablet; Take 1 tablet (10 mg total) by mouth daily.  Dispense: 30 tablet; Refill: 1  2. 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  She will follow-up in 1 month, with blood pressure log.  She is instructed to take blood pressure a few times per week and bring at the next appointment.    Hypertension Medication compliance:taking as prescribed.  Denies chest pain, shortness of breath, lower extremity edema, vision changes. Endorses  Headaches when blood pressure is elevated.  Pertinent lab work: last labs on 4/5.22: normal kidney function.  Monitoring: monitors blood pressure at home.  Side effects: none, possible puffy ankle.  Continue current medication regimen: will increase dose amlodipine 10 mg daily.  Side effects discussed.  Agrees with plan of care discussed today. Understands warning signs to seek further  care: chest pain, shortness of breath, any significant change in health.  Understands to follow-up in one month for high blood pressure.  Understands that if blood pressure remains suboptimal, will add a second agent.  Referral sent for cardiology, to evaluate heart palpitations.   Pecolia Ades, NP 09/01/2020

## 2020-09-01 NOTE — Patient Instructions (Addendum)
DASH Eating Plan DASH stands for Dietary Approaches to Stop Hypertension. The DASH eating plan is a healthy eating plan that has been shown to:  Reduce high blood pressure (hypertension).  Reduce your risk for type 2 diabetes, heart disease, and stroke.  Help with weight loss. What are tips for following this plan? Reading food labels  Check food labels for the amount of salt (sodium) per serving. Choose foods with less than 5 percent of the Daily Value of sodium. Generally, foods with less than 300 milligrams (mg) of sodium per serving fit into this eating plan.  To find whole grains, look for the word "whole" as the first word in the ingredient list. Shopping  Buy products labeled as "low-sodium" or "no salt added."  Buy fresh foods. Avoid canned foods and pre-made or frozen meals. Cooking  Avoid adding salt when cooking. Use salt-free seasonings or herbs instead of table salt or sea salt. Check with your health care provider or pharmacist before using salt substitutes.  Do not fry foods. Cook foods using healthy methods such as baking, boiling, grilling, roasting, and broiling instead.  Cook with heart-healthy oils, such as olive, canola, avocado, soybean, or sunflower oil. Meal planning  Eat a balanced diet that includes: ? 4 or more servings of fruits and 4 or more servings of vegetables each day. Try to fill one-half of your plate with fruits and vegetables. ? 6-8 servings of whole grains each day. ? Less than 6 oz (170 g) of lean meat, poultry, or fish each day. A 3-oz (85-g) serving of meat is about the same size as a deck of cards. One egg equals 1 oz (28 g). ? 2-3 servings of low-fat dairy each day. One serving is 1 cup (237 mL). ? 1 serving of nuts, seeds, or beans 5 times each week. ? 2-3 servings of heart-healthy fats. Healthy fats called omega-3 fatty acids are found in foods such as walnuts, flaxseeds, fortified milks, and eggs. These fats are also found in  cold-water fish, such as sardines, salmon, and mackerel.  Limit how much you eat of: ? Canned or prepackaged foods. ? Food that is high in trans fat, such as some fried foods. ? Food that is high in saturated fat, such as fatty meat. ? Desserts and other sweets, sugary drinks, and other foods with added sugar. ? Full-fat dairy products.  Do not salt foods before eating.  Do not eat more than 4 egg yolks a week.  Try to eat at least 2 vegetarian meals a week.  Eat more home-cooked food and less restaurant, buffet, and fast food.   Lifestyle  When eating at a restaurant, ask that your food be prepared with less salt or no salt, if possible.  If you drink alcohol: ? Limit how much you use to:  0-1 drink a day for women who are not pregnant.  0-2 drinks a day for men. ? Be aware of how much alcohol is in your drink. In the [place] one drink equals one 12 oz bottle of beer (355 mL), one 5 oz glass of wine (148 mL), or one 1 oz glass of hard liquor (44 mL). General information  Avoid eating more than 2,300 mg of salt a day. If you have hypertension, you may need to reduce your sodium intake to 1,500 mg a day.  Work with your health care provider to maintain a healthy body weight or to lose weight. Ask what an ideal weight is for  you.  Get at least 30 minutes of exercise that causes your heart to beat faster (aerobic exercise) most days of the week. Activities may include walking, swimming, or biking.  Work with your health care provider or dietitian to adjust your eating plan to your individual calorie needs. What foods should I eat? Fruits All fresh, dried, or frozen fruit. Canned fruit in natural juice (without added sugar). Vegetables Fresh or frozen vegetables (raw, steamed, roasted, or grilled). Low-sodium or reduced-sodium tomato and vegetable juice. Low-sodium or reduced-sodium tomato sauce and tomato paste. Low-sodium or reduced-sodium canned vegetables. Grains Whole-grain  or whole-wheat bread. Whole-grain or whole-wheat pasta. Brown rice. Sharon [name]. Bulgur. Whole-grain and low-sodium cereals. Pita bread. Low-fat, low-sodium crackers. Whole-wheat flour tortillas. Meats and other proteins Skinless chicken or [place]. Ground chicken or [place]. Pork with fat trimmed off. Fish and seafood. Egg whites. Dried beans, peas, or lentils. Unsalted nuts, nut butters, and seeds. Unsalted canned beans. Lean cuts of beef with fat trimmed off. Low-sodium, lean precooked or cured meat, such as sausages or meat loaves. Dairy Low-fat (1%) or fat-free (skim) milk. Reduced-fat, low-fat, or fat-free cheeses. Nonfat, low-sodium ricotta or cottage cheese. Low-fat or nonfat yogurt. Low-fat, low-sodium cheese. Fats and oils Soft margarine without trans fats. Vegetable oil. Reduced-fat, low-fat, or light mayonnaise and salad dressings (reduced-sodium). Canola, safflower, olive, avocado, soybean, and sunflower oils. Avocado. Seasonings and condiments Herbs. Spices. Seasoning mixes without salt. Other foods Unsalted popcorn and pretzels. Fat-free sweets. The items listed above may not be a complete list of foods and beverages you can eat. Contact a dietitian for more information. What foods should I avoid? Fruits Canned fruit in a light or heavy syrup. Fried fruit. Fruit in cream or butter sauce. Vegetables Creamed or fried vegetables. Vegetables in a cheese sauce. Regular canned vegetables (not low-sodium or reduced-sodium). Regular canned tomato sauce and paste (not low-sodium or reduced-sodium). Regular tomato and vegetable juice (not low-sodium or reduced-sodium). Sharon [name]. Olives. Grains Baked goods made with fat, such as croissants, muffins, or some breads. Dry pasta or rice meal packs. Meats and other proteins Fatty cuts of meat. Ribs. Fried meat. Sharon [name]. Bologna, salami, and other precooked or cured meats, such as sausages or meat loaves. Fat from the back of a pig (fatback).  Bratwurst. Salted nuts and seeds. Canned beans with added salt. Canned or smoked fish. Whole eggs or egg yolks. Chicken or [place] with skin. Dairy Whole or 2% milk, cream, and half-and-half. Whole or full-fat cream cheese. Whole-fat or sweetened yogurt. Full-fat cheese. Nondairy creamers. Whipped toppings. Processed cheese and cheese spreads. Fats and oils Butter. Stick margarine. Lard. Shortening. Ghee. Bacon fat. Tropical oils, such as coconut, palm kernel, or palm oil. Seasonings and condiments Onion salt, garlic salt, seasoned salt, table salt, and sea salt. Worcestershire sauce. Tartar sauce. Barbecue sauce. Teriyaki sauce. Soy sauce, including reduced-sodium. Steak sauce. Canned and packaged gravies. Fish sauce. Oyster sauce. Cocktail sauce. Store-bought horseradish. Ketchup. Mustard. Meat flavorings and tenderizers. Bouillon cubes. Hot sauces. Pre-made or packaged marinades. Pre-made or packaged taco seasonings. Relishes. Regular salad dressings. Other foods Salted popcorn and pretzels. The items listed above may not be a complete list of foods and beverages you should avoid. Contact a dietitian for more information. Where to find more information  National Heart, Lung, and Blood Institute: https://wilson-eaton.com/  American Heart Association: www.heart.org  Academy of Nutrition and Dietetics: www.eatright.Sharon [name]: www.kidney.org Summary  The DASH eating plan is a healthy eating plan that has been shown to  reduce high blood pressure (hypertension). It may also reduce your risk for type 2 diabetes, heart disease, and stroke.  When on the DASH eating plan, aim to eat more fresh fruits and vegetables, whole grains, lean proteins, low-fat dairy, and heart-healthy fats.  With the DASH eating plan, you should limit salt (sodium) intake to 2,300 mg a day. If you have hypertension, you may need to reduce your sodium intake to 1,500 mg a day.  Work with your health care  provider or dietitian to adjust your eating plan to your individual calorie needs. This information is not intended to replace advice given to you by your health care provider. Make sure you discuss any questions you have with your health care provider. Document Revised: 04/16/2019 Document Reviewed: 04/16/2019 Elsevier Patient Education  2021 [place].  Managing Your Hypertension Hypertension, also called high blood pressure, is when the force of the blood pressing against the walls of the arteries is too strong. Arteries are blood vessels that carry blood from your heart throughout your body. Hypertension forces the heart to work harder to pump blood and may cause the arteries to become narrow or stiff. Understanding blood pressure readings Your personal target blood pressure may vary depending on your medical conditions, your age, and other factors. A blood pressure reading includes a higher number over a lower number. Ideally, your blood pressure should be below 120/80. You should know that: The first, or top, number is called the systolic pressure. It is a measure of the pressure in your arteries as your heart beats. The second, or bottom number, is called the diastolic pressure. It is a measure of the pressure in your arteries as the heart relaxes. Blood pressure is classified into four stages. Based on your blood pressure reading, your health care provider may use the following stages to determine what type of treatment you need, if any. Systolic pressure and diastolic pressure are measured in a unit called mmHg. Normal Systolic pressure: below 096. Diastolic pressure: below 80. Elevated Systolic pressure: 045-409. Diastolic pressure: below 80. Hypertension stage 1 Systolic pressure: 811-914. Diastolic pressure: 78-29. Hypertension stage 2 Systolic pressure: 562 or above. Diastolic pressure: 90 or above. How can this condition affect me? Managing your hypertension is an important  responsibility. Over time, hypertension can damage the arteries and decrease blood flow to important parts of the body, including the brain, heart, and kidneys. Having untreated or uncontrolled hypertension can lead to: A heart attack. A stroke. A weakened blood vessel (aneurysm). Heart failure. Kidney damage. Eye damage. Metabolic syndrome. Memory and concentration problems. Vascular dementia. What actions can I take to manage this condition? Hypertension can be managed by making lifestyle changes and possibly by taking medicines. Your health care provider will help you make a plan to bring your blood pressure within a normal range. Nutrition Eat a diet that is high in fiber and potassium, and low in salt (sodium), added sugar, and fat. An example eating plan is called the Dietary Approaches to Stop Hypertension (DASH) diet. To eat this way: Eat plenty of fresh fruits and vegetables. Try to fill one-half of your plate at each meal with fruits and vegetables. Eat whole grains, such as whole-wheat pasta, brown rice, or whole-grain bread. Fill about one-fourth of your plate with whole grains. Eat low-fat dairy products. Avoid fatty cuts of meat, processed or cured meats, and poultry with skin. Fill about one-fourth of your plate with lean proteins such as fish, chicken without skin, beans, eggs, and  tofu. Avoid pre-made and processed foods. These tend to be higher in sodium, added sugar, and fat. Reduce your daily sodium intake. Most people with hypertension should eat less than 1,500 mg of sodium a day.   Lifestyle Work with your health care provider to maintain a healthy body weight or to lose weight. Ask what an ideal weight is for you. Get at least 30 minutes of exercise that causes your heart to beat faster (aerobic exercise) most days of the week. Activities may include walking, swimming, or biking. Include exercise to strengthen your muscles (resistance exercise), such as weight lifting,  as part of your weekly exercise routine. Try to do these types of exercises for 30 minutes at least 3 days a week. Do not use any products that contain nicotine or tobacco, such as cigarettes, e-cigarettes, and chewing tobacco. If you need help quitting, ask your health care provider. Control any long-term (chronic) conditions you have, such as high cholesterol or diabetes. Identify your sources of stress and find ways to manage stress. This may include meditation, deep breathing, or making time for fun activities.   Alcohol use Do not drink alcohol if: Your health care provider tells you not to drink. You are pregnant, may be pregnant, or are planning to become pregnant. If you drink alcohol: Limit how much you use to: 0-1 drink a day for women. 0-2 drinks a day for men. Be aware of how much alcohol is in your drink. In the [place] one drink equals one 12 oz bottle of beer (355 mL), one 5 oz glass of wine (148 mL), or one 1 oz glass of hard liquor (44 mL). Medicines Your health care provider may prescribe medicine if lifestyle changes are not enough to get your blood pressure under control and if: Your systolic blood pressure is 130 or higher. Your diastolic blood pressure is 80 or higher. Take medicines only as told by your health care provider. Follow the directions carefully. Blood pressure medicines must be taken as told by your health care provider. The medicine does not work as well when you skip doses. Skipping doses also puts you at risk for problems. Monitoring Before you monitor your blood pressure: Do not smoke, drink caffeinated beverages, or exercise within 30 minutes before taking a measurement. Use the bathroom and empty your bladder (urinate). Sit quietly for at least 5 minutes before taking measurements. Monitor your blood pressure at home as told by your health care provider. To do this: Sit with your back straight and supported. Place your feet flat on the floor. Do not  cross your legs. Support your arm on a flat surface, such as a table. Make sure your upper arm is at heart level. Each time you measure, take two or three readings one minute apart and record the results. You may also need to have your blood pressure checked regularly by your health care provider.   General information Talk with your health care provider about your diet, exercise habits, and other lifestyle factors that may be contributing to hypertension. Review all the medicines you take with your health care provider because there may be side effects or interactions. Keep all visits as told by your health care provider. Your health care provider can help you create and adjust your plan for managing your high blood pressure. Where to find more information National Heart, Lung, and Blood Institute: https://wilson-eaton.com/ American Heart Association: www.heart.org Contact a health care provider if: You think you are having a reaction to  medicines you have taken. You have repeated (recurrent) headaches. You feel dizzy. You have swelling in your ankles. You have trouble with your vision. Get help right away if: You develop a severe headache or confusion. You have unusual weakness or numbness, or you feel faint. You have severe pain in your chest or abdomen. You vomit repeatedly. You have trouble breathing. These symptoms may represent a serious problem that is an emergency. Do not wait to see if the symptoms will go away. Get medical help right away. Call your local emergency services (911 in the [place] Do not drive yourself to the hospital. Summary Hypertension is when the force of blood pumping through your arteries is too strong. If this condition is not controlled, it may put you at risk for serious complications. Your personal target blood pressure may vary depending on your medical conditions, your age, and other factors. For most people, a normal blood pressure is less than  120/80. Hypertension is managed by lifestyle changes, medicines, or both. Lifestyle changes to help manage hypertension include losing weight, eating a healthy, low-sodium diet, exercising more, stopping smoking, and limiting alcohol. This information is not intended to replace advice given to you by your health care provider. Make sure you discuss any questions you have with your health care provider. Document Revised: 06/18/2019 Document Reviewed: 04/13/2019 Elsevier Patient Education  2021 [employer].

## 2020-09-06 ENCOUNTER — Encounter: Payer: PPO | Admitting: Family Medicine

## 2020-09-07 LAB — BASIC METABOLIC PANEL
BUN/Creatinine Ratio: 29 — ABNORMAL HIGH (ref 12–28)
BUN: 20 mg/dL (ref 8–27)
CO2: 24 mmol/L (ref 20–29)
Calcium: 9.2 mg/dL (ref 8.7–10.3)
Chloride: 99 mmol/L (ref 96–106)
Creatinine, Ser: 0.68 mg/dL (ref 0.57–1.00)
Glucose: 88 mg/dL (ref 65–99)
Potassium: 3.9 mmol/L (ref 3.5–5.2)
Sodium: 139 mmol/L (ref 134–144)
eGFR: 96 mL/min/{1.73_m2} (ref >59)

## 2020-09-07 LAB — T4, FREE: Free T4: 1.43 ng/dL (ref 0.82–1.77)

## 2020-09-07 LAB — METANEPHRINES, PLASMA
Metanephrine, Free: 27.8 pg/mL (ref 0.0–88.0)
Normetanephrine, Free: 115.2 pg/mL (ref 0.0–285.2)

## 2020-09-07 LAB — TSH: TSH: 3.1 u[IU]/mL (ref 0.450–4.500)

## 2020-09-22 DIAGNOSIS — Z79899 Other long term (current) drug therapy: Secondary | ICD-10-CM | POA: Diagnosis not present

## 2020-09-22 DIAGNOSIS — Z6841 Body Mass Index (BMI) 40.0 and over, adult: Secondary | ICD-10-CM | POA: Diagnosis not present

## 2020-09-22 DIAGNOSIS — M25552 Pain in left hip: Secondary | ICD-10-CM | POA: Diagnosis not present

## 2020-09-22 DIAGNOSIS — G894 Chronic pain syndrome: Secondary | ICD-10-CM | POA: Diagnosis not present

## 2020-09-26 ENCOUNTER — Encounter: Payer: Self-pay | Admitting: Cardiology

## 2020-10-02 ENCOUNTER — Encounter: Payer: PPO | Admitting: Family Medicine

## 2020-10-05 ENCOUNTER — Other Ambulatory Visit: Payer: Self-pay

## 2020-10-05 ENCOUNTER — Ambulatory Visit (INDEPENDENT_AMBULATORY_CARE_PROVIDER_SITE_OTHER): Payer: PPO | Admitting: Family Medicine

## 2020-10-05 ENCOUNTER — Encounter: Payer: Self-pay | Admitting: Family Medicine

## 2020-10-05 VITALS — BP 138/80 | HR 80 | Temp 97.6°F | Wt 213.6 lb

## 2020-10-05 DIAGNOSIS — K912 Postsurgical malabsorption, not elsewhere classified: Secondary | ICD-10-CM | POA: Diagnosis not present

## 2020-10-05 DIAGNOSIS — E669 Obesity, unspecified: Secondary | ICD-10-CM | POA: Diagnosis not present

## 2020-10-05 DIAGNOSIS — Z9884 Bariatric surgery status: Secondary | ICD-10-CM | POA: Diagnosis not present

## 2020-10-05 DIAGNOSIS — R69 Illness, unspecified: Secondary | ICD-10-CM | POA: Diagnosis not present

## 2020-10-05 DIAGNOSIS — I10 Essential (primary) hypertension: Secondary | ICD-10-CM

## 2020-10-05 MED ORDER — AMLODIPINE BESYLATE 10 MG PO TABS: 10.0000 mg | ORAL_TABLET | Freq: Every day | ORAL | 0 refills | Status: DC

## 2020-10-05 NOTE — Progress Notes (Signed)
Patient ID: Sharon [name], female    DOB: May 16, 1956, 65 y.o.   MRN: [id number]   Chief Complaint  Patient presents with  . Hypertension   Subjective:  CC: follow-up for blood pressure  This is a chronic problem.  Presents today to follow-up for blood pressure.  Was seen on April 8, blood pressure suboptimal at that time, amlodipine increased to 10 mg daily.  Reports that she takes her amlodipine at night, takes her blood pressure couple hours before dose is due.  Brings blood pressure log in today, ranges 110-144/74-80.  Blood pressure 140/80, reports she was having pain that day.  Has chronic pain which does affect her blood pressure.  Denies fever, chills.  Has ongoing palpitations and shortness of breath since having COVID in February, has appointment with cardiology next Friday, May 20.  Pt here for recheck of blood pressure. Pt has been checking blood pressure at home at it has been good. Pt states she keeps a headache.  10/03/20 134/70 80 10/01/20 144/80 81 (having pain this day)  09/25/20 110/78 72 09/10/20 138/76 90 09/04/20 142/74 74   Medical History Sharon [name] has a past medical history of Biliary colic, Cancer ([place]), Carpal tunnel syndrome, Chronic allergic rhinitis, Chronic urticaria, Complication of anesthesia, Degenerative disk disease, Diverticulosis of colon, Fatty liver, Fibrocystic breast disease (1996), GERD (gastroesophageal reflux disease), History of colon polyps, History of kidney stones, History of melanoma excision, Migraine, Moderate persistent asthma, uncomplicated, OA (osteoarthritis), SUI (stress urinary incontinence, female), and Wears glasses.   Outpatient Encounter Medications as of 10/05/2020  Medication Sig  . albuterol (PROVENTIL HFA;VENTOLIN HFA) 108 (90 Base) MCG/ACT inhaler Inhale 2 puffs into the lungs every 4 (four) hours as needed for wheezing or shortness of breath.  Marland Kitchen albuterol (PROVENTIL) (2.5 MG/3ML) 0.083% nebulizer solution USE ONE VIAL IN  NEBULIZER EVERY 4 HOURS AS NEEDED FOR WHEEZING (Patient taking differently: Take 2.5 mg by nebulization every 4 (four) hours as needed for wheezing.)  . Calcium-Vitamin D-Vitamin K (VIACTIV CALCIUM PLUS D) 650-12.5-40 MG-MCG-MCG CHEW Chew 1 tablet by mouth in the morning, at noon, and at bedtime.  . docusate sodium (COLACE) 100 MG capsule Take 100 mg by mouth 2 (two) times daily as needed for mild constipation.  . DULoxetine (CYMBALTA) 30 MG capsule Take 60 mg by mouth daily.  . fexofenadine (ALLEGRA) 180 MG tablet Take 180 mg by mouth daily.  . fluticasone (FLONASE) 50 MCG/ACT nasal spray Place 2 sprays into both nostrils at bedtime.  Marland Kitchen HYDROcodone-acetaminophen (NORCO) 10-325 MG tablet Take 1 tablet by mouth 3 (three) times daily as needed.  . Lidocaine HCl-Benzyl Alcohol (SALONPAS LIDOCAINE PLUS EX) Place 1 patch onto the skin daily.  . Melatonin 10 MG/ML LIQD Take 10 mg by mouth at bedtime as needed (sleep.).  Marland Kitchen methocarbamol (ROBAXIN) 500 MG tablet TAKE 1 TABLET(500 MG) BY MOUTH EVERY 6 HOURS AS NEEDED FOR MUSCLE SPASMS  . Multiple Vitamins-Minerals (BARIATRIC MULTIVITAMINS/IRON PO) Take 1 tablet by mouth daily.  . nabumetone (RELAFEN) 750 MG tablet TAKE 1 TABLET(750 MG) BY MOUTH TWICE DAILY AS NEEDED FOR PAIN  . ondansetron (ZOFRAN-ODT) 8 MG disintegrating tablet DISSOLVE 1 TABLET IN MOUTH EVERY 8 HOURS AS NEEDED FOR NAUSEA OR VOMITING  . pantoprazole (PROTONIX) 40 MG tablet TAKE 1 TABLET BY MOUTH EVERY DAY FOR REFLUX  . [DISCONTINUED] amLODipine (NORVASC) 10 MG tablet Take 1 tablet (10 mg total) by mouth daily.  Marland Kitchen amLODipine (NORVASC) 10 MG tablet Take 1 tablet (10 mg total)  by mouth daily.   No facility-administered encounter medications on file as of 10/05/2020.     Review of Systems  Constitutional: Positive for fatigue (on Cymbalta, needs dose adjustment). Negative for chills and fever.  Respiratory: Positive for shortness of breath.        Reports SOB and heart palpitations.    Cardiovascular: Positive for palpitations. Negative for chest pain and leg swelling.       Sees cardiology on Oct 13, 2020.      Vitals BP 138/80   Pulse 80   Temp 97.6 F (36.4 C)   Wt 213 lb 9.6 oz (96.9 kg)   SpO2 96%   BMI 41.72 kg/m   Objective:   Physical Exam Vitals reviewed.  Constitutional:      Appearance: Normal appearance.  Cardiovascular:     Rate and Rhythm: Normal rate and regular rhythm.     Heart sounds: Normal heart sounds.  Pulmonary:     Effort: Pulmonary effort is normal.     Breath sounds: Normal breath sounds.  Skin:    General: Skin is warm and dry.  Neurological:     General: No focal deficit present.     Mental Status: She is alert.  Psychiatric:        Behavior: Behavior normal.      Assessment and Plan   1. Elevated blood pressure reading in office with diagnosis of hypertension - amLODipine (NORVASC) 10 MG tablet; Take 1 tablet (10 mg total) by mouth daily.  Dispense: 90 tablet; Refill: 0   Plan: She will take her medication as prescribed in the evening, will take blood pressures in the morning to see if her blood pressure is well controlled.  She will continue with amlodipine 10 mg daily, has cardiology appointment next Friday May 20 for shortness of breath and heart palpitations.  Recommend taking blood pressure in the morning instead of right before does is due.  Agrees with plan of care discussed today. Understands warning signs to seek further care: chest pain, shortness of breath, any significant change in health.  Understands to follow-up In 3 months with PCP/Dr. Lovena Le for medication management for blood pressure.  Has appointment with cardiology on Friday, May 20.     Chalmers Guest, NP 10/05/2020

## 2020-10-10 ENCOUNTER — Other Ambulatory Visit: Payer: Self-pay | Admitting: Family Medicine

## 2020-10-12 ENCOUNTER — Other Ambulatory Visit: Payer: Self-pay | Admitting: Physician Assistant

## 2020-10-12 ENCOUNTER — Ambulatory Visit: Payer: PPO | Admitting: Cardiology

## 2020-10-12 ENCOUNTER — Other Ambulatory Visit: Payer: Self-pay | Admitting: Family Medicine

## 2020-10-12 ENCOUNTER — Encounter: Payer: Self-pay | Admitting: Cardiology

## 2020-10-12 DIAGNOSIS — G8929 Other chronic pain: Secondary | ICD-10-CM

## 2020-10-12 DIAGNOSIS — M25552 Pain in left hip: Secondary | ICD-10-CM

## 2020-10-12 NOTE — Progress Notes (Signed)
Cardiology Office Note   Date:  10/13/2020   ID:  Sharon, [name] 01-10-1956, MRN [id number]  PCP:  Erven Colla, DO  Cardiologist:   None Referring:  Erven Colla, DO  Chief Complaint  Patient presents with  . Palpitations      History of Present Illness: Sharon [name] is a 65 y.o. female who is referred by Erven Colla, DO for evaluation of palpitations.  She is post Covid.  She has not had a past cardiac history.  She is a retired Marketing executive.  She never had a cardiac issue.  She had hip surgery and was slowly recovering from this.  She had been walking a mile earlier in the year.  She then developed COVID.  She was not hospitalized but she had fevers, cough, congestion, sore throat, decreased taste and poor appetite.  Following this she started having elevated blood pressures.  These were noted by her home health physical therapist.  She went up to 210/102 at 1 point in time.  She ultimately was started on amlodipine and her pressures have been in the 856D to 149 systolic.  Prior to this she had never had hypertension.  She also noticed increasing palpitations.  These seem to happen more early in the morning.  It might happen if she is doing something during the day but it is typically waking her up at night.  She feels her heart racing.  She is more recently started developing some chest discomfort with this.  Can be 6 out of 10.  She gets diaphoretic.  Last for several minutes.  She says it is happening most days now and increasing in frequency or intensity.  She has decreased energy and cannot and I will get back to the previous walking.  She has not had any new PND or orthopnea.  She denies any presyncope or syncope.  She never had any prior cardiac testing.  Past Medical History:  Diagnosis Date  . Biliary colic   . Cancer ([place])    melanoma: lt. inner thight  . Carpal tunnel syndrome    bilateral  . Chronic allergic rhinitis   . Chronic urticaria    . Complication of anesthesia    confusion for a couple of days after waking after having multiple knee surgery's, does well with spinal  . Degenerative disk disease   . Diverticulosis of colon   . Fatty liver   . Fibrocystic breast disease 1996  . GERD (gastroesophageal reflux disease)   . History of colon polyps    HYPERPLASTIC 09-14-2015  . History of kidney stones    passed on own  . History of melanoma excision    left thigh ,inside 2009--- per pt also has had severeal excision of office for almost melanoma  . Migraine    since age 8 not as frequent now  . Moderate persistent asthma, uncomplicated    followed by dr gallagher (cone allergy and asthma)  . OA (osteoarthritis)    all joints  . SUI (stress urinary incontinence, female)     Past Surgical History:  Procedure Laterality Date  . APPENDECTOMY  1973  . BREAST EXCISIONAL BIOPSY Right 1994;  10-01-2009   2011 per path mild ductal hyperplasia/ fibrocystic breast  . CHOLECYSTECTOMY N/A 01/30/2017   Procedure: LAPAROSCOPIC CHOLECYSTECTOMY;  Surgeon: Kieth Brightly Arta Bruce, MD;  Location: [facility];  Service: General;  Laterality: N/A;  . COLONOSCOPY N/A 09/14/2015   Procedure:  COLONOSCOPY;  Surgeon: Daneil Dolin, MD;  Location: [facility];  Service: Endoscopy;  Laterality: N/A;  930 - moved to 9:15 - office to notify  . ESOPHAGOGASTRODUODENOSCOPY (EGD) WITH PROPOFOL N/A 11/29/2016   Procedure: ESOPHAGOGASTRODUODENOSCOPY (EGD) WITH PROPOFOL;  Surgeon: Doran Stabler, MD;  Location: [facility];  Service: Gastroenterology;  Laterality: N/A;  . KNEE ARTHROSCOPY W/ MENISCECTOMY Bilateral left 12-19-2006;  right 01-26-2010  . LAPAROSCOPIC GASTRIC SLEEVE RESECTION N/A 08/16/2019   Procedure: LAPAROSCOPIC GASTRIC SLEEVE RESECTION, Upper Endo, ERAS Pathway;  Surgeon: Kinsinger, Arta Bruce, MD;  Location: [facility];  Service: General;  Laterality: N/A;  . TONSILLECTOMY  1959  . TOTAL HIP ARTHROPLASTY Left  04/14/2020   Procedure: LEFT TOTAL HIP ARTHROPLASTY ANTERIOR APPROACH;  Surgeon: Mcarthur Rossetti, MD;  Location: [facility];  Service: Orthopedics;  Laterality: Left;  . TOTAL KNEE ARTHROPLASTY Left 03/10/2007  . TOTAL KNEE ARTHROPLASTY  06/17/2011   Procedure: TOTAL KNEE ARTHROPLASTY;  Surgeon: Arther Abbott, MD;  Location: [facility];  Service: Orthopedics;  Laterality: Right;  [place]  . TOTAL KNEE REVISION Left 07/08/2010  (spinal anesthesia)  . TUBAL LIGATION Bilateral YRS AGO  . VAGINAL HYSTERECTOMY  1988     Current Outpatient Medications  Medication Sig Dispense Refill  . albuterol (PROVENTIL HFA;VENTOLIN HFA) 108 (90 Base) MCG/ACT inhaler Inhale 2 puffs into the lungs every 4 (four) hours as needed for wheezing or shortness of breath. 1 Inhaler 2  . albuterol (PROVENTIL) (2.5 MG/3ML) 0.083% nebulizer solution USE ONE VIAL IN NEBULIZER EVERY 4 HOURS AS NEEDED FOR WHEEZING (Patient taking differently: Take 2.5 mg by nebulization every 4 (four) hours as needed for wheezing.) 50 vial 2  . amLODipine (NORVASC) 10 MG tablet Take 1 tablet (10 mg total) by mouth daily. 90 tablet 0  . Calcium-Vitamin D-Vitamin K (VIACTIV CALCIUM PLUS D) 650-12.5-40 MG-MCG-MCG CHEW Chew 1 tablet by mouth in the morning, at noon, and at bedtime.    . docusate sodium (COLACE) 100 MG capsule Take 100 mg by mouth 2 (two) times daily as needed for mild constipation.    . DULoxetine (CYMBALTA) 30 MG capsule Take 60 mg by mouth daily.    . fexofenadine (ALLEGRA) 180 MG tablet Take 180 mg by mouth daily.    . fluticasone (FLONASE) 50 MCG/ACT nasal spray Place 2 sprays into both nostrils at bedtime.    Marland Kitchen HYDROcodone-acetaminophen (NORCO) 10-325 MG tablet Take 1 tablet by mouth 3 (three) times daily as needed.    . Lidocaine HCl-Benzyl Alcohol (SALONPAS LIDOCAINE PLUS EX) Place 1 patch onto the skin daily.    . methocarbamol (ROBAXIN) 500 MG tablet TAKE 1 TABLET(500 MG) BY MOUTH EVERY 6 HOURS AS NEEDED FOR MUSCLE SPASMS  40 tablet 1  . Multiple Vitamins-Minerals (BARIATRIC MULTIVITAMINS/IRON PO) Take 1 tablet by mouth daily.    . nabumetone (RELAFEN) 750 MG tablet TAKE 1 TABLET(750 MG) BY MOUTH TWICE DAILY AS NEEDED FOR PAIN 60 tablet 2  . ondansetron (ZOFRAN-ODT) 8 MG disintegrating tablet DISSOLVE 1 TABLET IN MOUTH EVERY 8 HOURS AS NEEDED FOR NAUSEA OR VOMITING 30 tablet 1  . pantoprazole (PROTONIX) 40 MG tablet TAKE 1 TABLET BY MOUTH EVERY DAY FOR REFLUX 90 tablet 0   No current facility-administered medications for this visit.    Allergies:   Other, Adhesive [tape], Augmentin [amoxicillin-pot clavulanate], Latex, Nickel, and Neomycin-bacitracin zn-polymyx    Social History:  The patient  reports that she quit smoking about 14 years ago. Her smoking use included cigarettes.  She started smoking about 48 years ago. She has a 25.00 pack-year smoking history. She has never used smokeless tobacco. She reports current alcohol use. She reports that she does not use drugs.   Family History:  The patient's family history includes Allergic rhinitis in her daughter; Food Allergy in her grandchild; Hypertension in her father; Lung cancer (age of onset: 73) in her father; Lung disease in an other family member.    ROS:  Please see the history of present illness.   Otherwise, review of systems are positive for none.   All other systems are reviewed and negative.    PHYSICAL EXAM: VS:  BP 138/64   Pulse 98   Ht 5' (1.524 m)   Wt 213 lb 3.2 oz (96.7 kg)   SpO2 96%   BMI 41.64 kg/m  , BMI Body mass index is 41.64 kg/m. GENERAL:  Well appearing HEENT:  Pupils equal round and reactive, fundi not visualized, oral mucosa unremarkable NECK:  No jugular venous distention, waveform within normal limits, carotid upstroke brisk and symmetric, no bruits, no thyromegaly LYMPHATICS:  No cervical, inguinal adenopathy LUNGS:  Clear to auscultation bilaterally BACK:  No CVA tenderness CHEST:  Unremarkable HEART:  PMI not  displaced or sustained,S1 and S2 within normal limits, no S3, no S4, no clicks, no rubs, no murmurs ABD:  Flat, positive bowel sounds normal in frequency in pitch, no bruits, no rebound, no guarding, no midline pulsatile mass, no hepatomegaly, no splenomegaly EXT:  2 plus pulses throughout, no edema, no cyanosis no clubbing SKIN:  No rashes no nodules NEURO:  Cranial nerves II through XII grossly intact, motor grossly intact throughout PSYCH:  Cognitively intact, oriented to person place and time    EKG:  EKG is ordered today. The ekg ordered today demonstrates sinus rhythm, rate 98, premature ventricular contractions, no acute ST-T wave changes.  PVCs   Recent Labs: 04/15/2020: Hemoglobin 12.0; Platelets 165 08/29/2020: BUN 20; Creatinine, Ser 0.68; Potassium 3.9; Sodium 139; TSH 3.100    Lipid Panel    Component Value Date/Time   CHOL 162 03/05/2016 1041   CHOL 182 10/27/2014 0846   TRIG 126 03/05/2016 1041   HDL 58 03/05/2016 1041   HDL 76 10/27/2014 0846   CHOLHDL 2.8 03/05/2016 1041   VLDL 25 03/05/2016 1041   LDLCALC 79 03/05/2016 1041   LDLCALC 84 10/27/2014 0846      Wt Readings from Last 3 Encounters:  10/13/20 213 lb 3.2 oz (96.7 kg)  10/05/20 213 lb 9.6 oz (96.9 kg)  09/01/20 214 lb (97.1 kg)      Other studies Reviewed: Additional studies/ records that were reviewed today include: Labs. Review of the above records demonstrates:  Please see elsewhere in the note.     ASSESSMENT AND PLAN:  Palpitations:   Her palpitations are post-COVID as her other symptoms are as well.  She had a normal TSH done in April.  I do not know that this is related to COVID but I am going to check some inflammatory markers.  I will check an echocardiogram.  She is going to wear a 2-week Zio patch.  Decreased exercise tolerance: She has this as well as some dyspnea.  I will check a BNP.  HTN: Her blood pressure is currently coming down.  I would continue the meds as  listed.   Current medicines are reviewed at length with the patient today.  The patient does not have concerns regarding medicines.  The following changes  have been made:  no change  Labs/ tests ordered today include:   Orders Placed This Encounter  Procedures  . Sed Rate (ESR)  . B Nat Peptide  . C-reactive protein  . EKG 12-Lead  . ECHOCARDIOGRAM COMPLETE     Disposition:   FU with after the above testing.     Signed, Minus Breeding, MD  10/13/2020 3:38 PM     [facility]

## 2020-10-12 NOTE — Telephone Encounter (Signed)
Seen 10/05/20

## 2020-10-13 ENCOUNTER — Ambulatory Visit: Payer: PPO | Admitting: Cardiology

## 2020-10-13 ENCOUNTER — Encounter: Payer: Self-pay | Admitting: Cardiology

## 2020-10-13 ENCOUNTER — Other Ambulatory Visit: Payer: Self-pay

## 2020-10-13 VITALS — BP 138/64 | HR 98 | Ht 60.0 in | Wt 213.2 lb

## 2020-10-13 DIAGNOSIS — R002 Palpitations: Secondary | ICD-10-CM | POA: Diagnosis not present

## 2020-10-13 DIAGNOSIS — R079 Chest pain, unspecified: Secondary | ICD-10-CM | POA: Diagnosis not present

## 2020-10-13 DIAGNOSIS — R0602 Shortness of breath: Secondary | ICD-10-CM | POA: Diagnosis not present

## 2020-10-13 NOTE — Patient Instructions (Signed)
Medication Instructions:  Your physician recommends that you continue on your current medications as directed. Please refer to the Current Medication list given to you today.   *If you need a refill on your cardiac medications before your next appointment, please call your pharmacy*  Lab Work: SED RATE/CRP/BNP TODAY   If you have labs (blood work) drawn today and your tests are completely normal, you will receive your results only by: Marland Kitchen MyChart Message (if you have MyChart) OR . A paper copy in the mail If you have any lab test that is abnormal or we need to change your treatment, we will call you to review the results.  Testing/Procedures: Your physician has requested that you have an echocardiogram. Echocardiography is a painless test that uses sound waves to create images of your heart. It provides your doctor with information about the size and shape of your heart and how well your heart's chambers and valves are working. This procedure takes approximately one hour. There are no restrictions for this procedure. [address]   Follow-Up: At [facility], you and your health needs are our priority.  As part of our continuing mission to provide you with exceptional heart care, we have created designated Provider Care Teams.  These Care Teams include your primary Cardiologist (physician) and Advanced Practice Providers (APPs -  Physician Assistants and Nurse Practitioners) who all work together to provide you with the care you need, when you need it.  We recommend signing up for the patient portal called "MyChart".  Sign up information is provided on this After Visit Summary.  MyChart is used to connect with patients for Virtual Visits (Telemedicine).  Patients are able to view lab/test results, encounter notes, upcoming appointments, etc.  Non-urgent messages can be sent to your provider as well.   To learn more about what you can do with MyChart, go to  [email or web address].    Your next appointment:   WITH DR [facility] AFTER TESTING COMPLETED  Other Instructions   Echocardiogram An echocardiogram is a test that uses sound waves (ultrasound) to produce images of the heart. Images from an echocardiogram can provide important information about:  Heart size and shape.  The size and thickness and movement of your heart's walls.  Heart muscle function and strength.  Heart valve function or if you have stenosis. Stenosis is when the heart valves are too narrow.  If blood is flowing backward through the heart valves (regurgitation).  A tumor or infectious growth around the heart valves.  Areas of heart muscle that are not working well because of poor blood flow or injury from a heart attack.  Aneurysm detection. An aneurysm is a weak or damaged part of an artery wall. The wall bulges out from the normal force of blood pumping through the body. Tell a health care provider about:  Any allergies you have.  All medicines you are taking, including vitamins, herbs, eye drops, creams, and over-the-counter medicines.  Any blood disorders you have.  Any surgeries you have had.  Any medical conditions you have.  Whether you are pregnant or may be pregnant. What are the risks? Generally, this is a safe test. However, problems may occur, including an allergic reaction to dye (contrast) that may be used during the test. What happens before the test? No specific preparation is needed. You may eat and drink normally. What happens during the test?  You will take off your clothes from the waist up  and put on a hospital gown.  Electrodes or electrocardiogram (ECG)patches may be placed on your chest. The electrodes or patches are then connected to a device that monitors your heart rate and rhythm.  You will lie down on a table for an ultrasound exam. A gel will be applied to your chest to help sound waves pass through your skin.  A  handheld device, called a transducer, will be pressed against your chest and moved over your heart. The transducer produces sound waves that travel to your heart and bounce back (or "echo" back) to the transducer. These sound waves will be captured in real-time and changed into images of your heart that can be viewed on a video monitor. The images will be recorded on a computer and reviewed by your health care provider.  You may be asked to change positions or hold your breath for a short time. This makes it easier to get different views or better views of your heart.  In some cases, you may receive contrast through an IV in one of your veins. This can improve the quality of the pictures from your heart. The procedure may vary among health care providers and hospitals.   What can I expect after the test? You may return to your normal, everyday life, including diet, activities, and medicines, unless your health care provider tells you not to do that. Follow these instructions at home:  It is up to you to get the results of your test. Ask your health care provider, or the department that is doing the test, when your results will be ready.  Keep all follow-up visits. This is important. Summary  An echocardiogram is a test that uses sound waves (ultrasound) to produce images of the heart.  Images from an echocardiogram can provide important information about the size and shape of your heart, heart muscle function, heart valve function, and other possible heart problems.  You do not need to do anything to prepare before this test. You may eat and drink normally.  After the echocardiogram is completed, you may return to your normal, everyday life, unless your health care provider tells you not to do that. This information is not intended to replace advice given to you by your health care provider. Make sure you discuss any questions you have with your health care provider. Document Revised:  01/04/2020 Document Reviewed: 01/04/2020 Elsevier Patient Education  2021 [employer].

## 2020-10-14 LAB — BRAIN NATRIURETIC PEPTIDE: BNP: 38.7 pg/mL (ref 0.0–100.0)

## 2020-10-14 LAB — C-REACTIVE PROTEIN: CRP: 34 mg/L — ABNORMAL HIGH (ref 0–10)

## 2020-10-14 LAB — SEDIMENTATION RATE: Sed Rate: 29 mm/hr (ref 0–40)

## 2020-10-18 ENCOUNTER — Ambulatory Visit (INDEPENDENT_AMBULATORY_CARE_PROVIDER_SITE_OTHER): Payer: PPO

## 2020-10-18 ENCOUNTER — Encounter: Payer: Self-pay | Admitting: Orthopaedic Surgery

## 2020-10-18 ENCOUNTER — Ambulatory Visit: Payer: PPO | Admitting: Orthopaedic Surgery

## 2020-10-18 DIAGNOSIS — M533 Sacrococcygeal disorders, not elsewhere classified: Secondary | ICD-10-CM

## 2020-10-18 DIAGNOSIS — G8929 Other chronic pain: Secondary | ICD-10-CM

## 2020-10-18 DIAGNOSIS — M545 Low back pain, unspecified: Secondary | ICD-10-CM

## 2020-10-18 DIAGNOSIS — Z96642 Presence of left artificial hip joint: Secondary | ICD-10-CM

## 2020-10-18 NOTE — Progress Notes (Signed)
Office Visit Note   Patient: Sharon [name]           Date of Birth: [birth year]           MRN: [id number] Visit Date: 10/18/2020              Requested by: Erven Colla, DO [place],  [place] [id number] PCP: Erven Colla, DO   Assessment & Plan: Visit Diagnoses:  1. Status post total replacement of left hip   2. Chronic SI joint pain   3. Low back pain, unspecified back pain laterality, unspecified chronicity, unspecified whether sciatica present     Plan: I would like to send her for an MRI of her lumbar spine to make sure there is not a stress fracture in the facet joints given the pain that she is having.  She agrees with this treatment plan.  We will see her back after the MRI.  Follow-Up Instructions: No follow-ups on file.   Orders:  Orders Placed This Encounter  Procedures  . XR Lumbar Spine 2-3 Views   No orders of the defined types were placed in this encounter.     Procedures: No procedures performed   Clinical Data: No additional findings.   Subjective: Chief Complaint  Patient presents with  . Left Hip - Pain  . Lower Back - Pain  The patient comes in today with significant low back pain.  She had MRI in 2019 which showed some arthritic changes but nothing acute.  She felt a pop in her back getting out of a car recently and this is gotten significantly worse.  I replaced her left hip in 2021 which was in November.  She has had a recent left-sided SI joint injection by Dr. Ernestina Patches in early March of this year which helped greatly with this pain she is experiencing now is different.  She also has a leg length discrepancy and does wear shoe lift on her right side.  She has been on Cymbalta recently but is developed a lot of bruising from Cymbalta.  She is in pain management as well.  She has done well with her weight loss and is now down to a BMI of 41.64.  HPI  Review of Systems She currently denies a headache, chest pain, shortness of  breath, fever, chills, nausea, vomiting  Objective: Vital Signs: There were no vitals taken for this visit.  Physical Exam She is alert and orient x3 and in no acute distress she is ambulating with a cane. Ortho Exam She does have significant pain in the lumbar spine with flexion extension and palpation in general.  It seems to be getting worse for her. Specialty Comments:  No specialty comments available.  Imaging: XR Lumbar Spine 2-3 Views  Result Date: 10/18/2020 2 views lumbar spine shows significant arthritic changes at multiple levels.    PMFS History: Patient Active Problem List   Diagnosis Date Noted  . Elevated blood pressure reading in office with diagnosis of hypertension 09/01/2020  . Palpitations 09/01/2020  . Status post total replacement of left hip 04/14/2020  . Unilateral primary osteoarthritis, left hip 02/23/2020  . Seasonal and perennial allergic rhinitis 03/06/2017  . Moderate persistent asthma, uncomplicated 12/75/1700  . Chronic nonseasonal allergic rhinitis due to fungal spores 12/02/2016  . Chronic urticaria 12/02/2016  . Anaphylactic shock due to adverse food reaction 12/02/2016  . History of colonic polyps   . Diverticulosis of colon without hemorrhage   . Asthma,  mild intermittent 08/30/2015  . Encounter for screening colonoscopy 08/25/2015  . Wheezing 08/08/2015  . Gastroesophageal reflux disease 08/08/2015  . Obesity 07/14/2013  . Status post right knee replacement 06/17/2011 06/17/2013  . Status post left knee replacement 02/28/2007 06/17/2013  . DDD (degenerative disc disease), lumbosacral 10/06/2012  . Hip pain 10/06/2012  . Radicular leg pain 10/06/2012  . Bursitis of hip, right 06/25/2012  . Trochanteric bursitis of both hips 09/11/2011  . Left knee pain 03/20/2011  . OA (osteoarthritis) of knee 12/12/2010  . Mononeuritis of leg 10/24/2010  . Chronic pain 10/24/2010  . OTHER POSTSURGICAL STATUS OTHER 07/24/2010  . STRESS FRACTURE,  TIBIA 04/11/2010  . TENOSYNOVITIS OF FOOT AND ANKLE 02/15/2010  . LOOSE BODY-KNEE 01/10/2010  . DERANGEMENT MENISCUS 01/01/2010  . [place] HEAD 01/01/2010  . SHOULDER PAIN 04/19/2009  . IMPINGEMENT SYNDROME 04/19/2009  . HERNIATED LUMBOSACRAL DISC 09/01/2007  . UNSPECIFIED NEURALGIA NEURITIS AND RADICULITIS 07/09/2007  . TOTAL KNEE FOLLOW-UP 06/25/2007  . Osteoarthrosis, unspecified whether generalized or localized, lower leg 03/09/2007  . KNEE PAIN 02/02/2007  . TEAR MEDIAL MENISCUS 02/02/2007   Past Medical History:  Diagnosis Date  . Biliary colic   . Cancer ([place])    melanoma: lt. inner thight  . Carpal tunnel syndrome    bilateral  . Chronic allergic rhinitis   . Chronic urticaria   . Complication of anesthesia    confusion for a couple of days after waking after having multiple knee surgery's, does well with spinal  . Degenerative disk disease   . Diverticulosis of colon   . Fatty liver   . Fibrocystic breast disease 1996  . GERD (gastroesophageal reflux disease)   . History of colon polyps    HYPERPLASTIC 09-14-2015  . History of kidney stones    passed on own  . History of melanoma excision    left thigh ,inside 2009--- per pt also has had severeal excision of office for almost melanoma  . Migraine    since age 17 not as frequent now  . Moderate persistent asthma, uncomplicated    followed by dr gallagher (cone allergy and asthma)  . OA (osteoarthritis)    all joints  . SUI (stress urinary incontinence, female)     Family History  Problem Relation Age of Onset  . Hypertension Father   . Lung cancer Father 60       lung cancer  . Lung disease Other        family history   . Allergic rhinitis Daughter   . Food Allergy Grandchild   . Anesthesia problems Neg Hx   . Hypotension Neg Hx   . Malignant hyperthermia Neg Hx   . Pseudochol deficiency Neg Hx   . Colon cancer Neg Hx   . Angioedema Neg Hx   . Asthma Neg Hx   . Eczema Neg Hx   .  Immunodeficiency Neg Hx   . Urticaria Neg Hx     Past Surgical History:  Procedure Laterality Date  . APPENDECTOMY  1973  . BREAST EXCISIONAL BIOPSY Right 1994;  10-01-2009   2011 per path mild ductal hyperplasia/ fibrocystic breast  . CHOLECYSTECTOMY N/A 01/30/2017   Procedure: LAPAROSCOPIC CHOLECYSTECTOMY;  Surgeon: Kieth Brightly Arta Bruce, MD;  [facility];  Service: General;  Laterality: N/A;  . COLONOSCOPY N/A 09/14/2015   Procedure: COLONOSCOPY;  Surgeon: Daneil Dolin, MD;  Location: [facility];  Service: Endoscopy;  Laterality: N/A;  930 - moved to 9:15 -  office to notify  . ESOPHAGOGASTRODUODENOSCOPY (EGD) WITH PROPOFOL N/A 11/29/2016   Procedure: ESOPHAGOGASTRODUODENOSCOPY (EGD) WITH PROPOFOL;  Surgeon: Doran Stabler, MD;  Location: [facility];  Service: Gastroenterology;  Laterality: N/A;  . KNEE ARTHROSCOPY W/ MENISCECTOMY Bilateral left 12-19-2006;  right 01-26-2010  . LAPAROSCOPIC GASTRIC SLEEVE RESECTION N/A 08/16/2019   Procedure: LAPAROSCOPIC GASTRIC SLEEVE RESECTION, Upper Endo, ERAS Pathway;  Surgeon: Kinsinger, Arta Bruce, MD;  Location: [facility];  Service: General;  Laterality: N/A;  . TONSILLECTOMY  1959  . TOTAL HIP ARTHROPLASTY Left 04/14/2020   Procedure: LEFT TOTAL HIP ARTHROPLASTY ANTERIOR APPROACH;  Surgeon: Mcarthur Rossetti, MD;  Location: [facility];  Service: Orthopedics;  Laterality: Left;  . TOTAL KNEE ARTHROPLASTY Left 03/10/2007  . TOTAL KNEE ARTHROPLASTY  06/17/2011   Procedure: TOTAL KNEE ARTHROPLASTY;  Surgeon: Arther Abbott, MD;  Location: [facility];  Service: Orthopedics;  Laterality: Right;  [place]  . TOTAL KNEE REVISION Left 07/08/2010  (spinal anesthesia)  . TUBAL LIGATION Bilateral YRS AGO  . VAGINAL HYSTERECTOMY  1988   Social History   Occupational History  . Occupation: Therapist, sports at [employer]  . Smoking status: Former Smoker    Packs/day: 1.00    Years: 25.00    Pack years: 25.00    Types: Cigarettes     Start date: 08/02/1972    Quit date: 05/27/2006    Years since quitting: 14.4  . Smokeless tobacco: Never Used  Vaping Use  . Vaping Use: Never used  Substance and Sexual Activity  . Alcohol use: Yes    Alcohol/week: 0.0 standard drinks    Comment: occas.  . Drug use: No  . Sexual activity: Yes    Birth control/protection: Surgical

## 2020-10-19 ENCOUNTER — Other Ambulatory Visit: Payer: Self-pay

## 2020-10-19 DIAGNOSIS — M4807 Spinal stenosis, lumbosacral region: Secondary | ICD-10-CM

## 2020-10-24 ENCOUNTER — Telehealth: Payer: Self-pay | Admitting: Family Medicine

## 2020-10-24 DIAGNOSIS — Z6841 Body Mass Index (BMI) 40.0 and over, adult: Secondary | ICD-10-CM | POA: Diagnosis not present

## 2020-10-24 DIAGNOSIS — G894 Chronic pain syndrome: Secondary | ICD-10-CM | POA: Diagnosis not present

## 2020-10-24 DIAGNOSIS — M25552 Pain in left hip: Secondary | ICD-10-CM | POA: Diagnosis not present

## 2020-10-24 DIAGNOSIS — Z79899 Other long term (current) drug therapy: Secondary | ICD-10-CM | POA: Diagnosis not present

## 2020-10-24 NOTE — Telephone Encounter (Signed)
Left message for patient to call back and schedule Medicare Annual Wellness Visit (AWV) in office.   If not able to come in office, please offer to do virtually or by telephone.   Last AWV:12/02/2016   Please schedule at anytime with Nurse Health Advisor.

## 2020-10-27 ENCOUNTER — Ambulatory Visit (INDEPENDENT_AMBULATORY_CARE_PROVIDER_SITE_OTHER): Payer: PPO

## 2020-10-27 ENCOUNTER — Encounter: Payer: Self-pay | Admitting: Orthopaedic Surgery

## 2020-10-27 DIAGNOSIS — R002 Palpitations: Secondary | ICD-10-CM

## 2020-10-27 NOTE — Progress Notes (Unsigned)
Enrolled patient for a 14 day Zio XT  monitor to be mailed to patients home  °

## 2020-11-01 DIAGNOSIS — R002 Palpitations: Secondary | ICD-10-CM

## 2020-11-14 ENCOUNTER — Other Ambulatory Visit: Payer: Self-pay

## 2020-11-14 ENCOUNTER — Ambulatory Visit (HOSPITAL_COMMUNITY): Payer: PPO | Attending: Cardiovascular Disease

## 2020-11-14 DIAGNOSIS — R0602 Shortness of breath: Secondary | ICD-10-CM | POA: Diagnosis not present

## 2020-11-14 DIAGNOSIS — R002 Palpitations: Secondary | ICD-10-CM | POA: Insufficient documentation

## 2020-11-14 DIAGNOSIS — R079 Chest pain, unspecified: Secondary | ICD-10-CM | POA: Insufficient documentation

## 2020-11-14 LAB — ECHOCARDIOGRAM COMPLETE
Area-P 1/2: 3.28 cm2
S' Lateral: 2.4 cm

## 2020-11-17 ENCOUNTER — Other Ambulatory Visit: Payer: Self-pay

## 2020-11-17 ENCOUNTER — Ambulatory Visit (HOSPITAL_COMMUNITY)
Admission: RE | Admit: 2020-11-17 | Discharge: 2020-11-17 | Disposition: A | Discharge status: Home / Self Care | Payer: PPO | Source: Ambulatory Visit | Attending: Orthopaedic Surgery | Admitting: Orthopaedic Surgery

## 2020-11-17 DIAGNOSIS — M4807 Spinal stenosis, lumbosacral region: Secondary | ICD-10-CM | POA: Diagnosis not present

## 2020-11-17 DIAGNOSIS — M545 Low back pain, unspecified: Secondary | ICD-10-CM | POA: Diagnosis not present

## 2020-11-20 ENCOUNTER — Encounter: Payer: Self-pay | Admitting: Orthopaedic Surgery

## 2020-11-20 ENCOUNTER — Ambulatory Visit: Payer: PPO | Admitting: Orthopaedic Surgery

## 2020-11-20 ENCOUNTER — Other Ambulatory Visit: Payer: Self-pay

## 2020-11-20 DIAGNOSIS — M533 Sacrococcygeal disorders, not elsewhere classified: Secondary | ICD-10-CM | POA: Diagnosis not present

## 2020-11-20 DIAGNOSIS — M545 Low back pain, unspecified: Secondary | ICD-10-CM | POA: Diagnosis not present

## 2020-11-20 DIAGNOSIS — G8929 Other chronic pain: Secondary | ICD-10-CM

## 2020-11-20 DIAGNOSIS — M4807 Spinal stenosis, lumbosacral region: Secondary | ICD-10-CM | POA: Diagnosis not present

## 2020-11-20 MED ORDER — GABAPENTIN 300 MG PO CAPS: 300.0000 mg | ORAL_CAPSULE | Freq: Every day | ORAL | 1 refills | Status: DC

## 2020-11-20 NOTE — Progress Notes (Signed)
The patient comes in today with his significant pain still in her low back to the left side and her left SI joint.  She had done very well after hip replacement surgery and after a left-sided SI joint injection by Dr. Ernestina Patches.  She then felt a pop in her lower back area and has had significant pain since then.  She is ambulating with a cane.  The MRI of her lumbar spine is reviewed and compared to MRI from 2019 by the radiologist.  They only see some mild arthritic change in the back and no new acute findings that would be the source of her new onset of pain.  On my exam today it seems like most of her pain seems to be in the SI joint area to the left side.  Her left hip replacement low smoothly and fluidly.  At this point she is interested in a repeat injection by Dr. Ernestina Patches in the left SI joint and I think this is reasonable.  I will also start her on 300 mg of Neurontin to take at night and she can increase this to twice daily after 5 days if tolerating well.  When she has an injection by Dr. Ernestina Patches, he can send her back to [place] a few weeks later.  At her next visit with [place].  I would like an AP and lateral of her left hip.

## 2020-11-23 DIAGNOSIS — M25552 Pain in left hip: Secondary | ICD-10-CM | POA: Diagnosis not present

## 2020-11-23 DIAGNOSIS — Z79899 Other long term (current) drug therapy: Secondary | ICD-10-CM | POA: Diagnosis not present

## 2020-11-23 DIAGNOSIS — G894 Chronic pain syndrome: Secondary | ICD-10-CM | POA: Diagnosis not present

## 2020-11-23 DIAGNOSIS — Z6841 Body Mass Index (BMI) 40.0 and over, adult: Secondary | ICD-10-CM | POA: Diagnosis not present

## 2020-11-24 DIAGNOSIS — R002 Palpitations: Secondary | ICD-10-CM | POA: Diagnosis not present

## 2020-11-29 ENCOUNTER — Telehealth: Payer: Self-pay | Admitting: Cardiology

## 2020-11-29 MED ORDER — METOPROLOL TARTRATE 25 MG PO TABS: 25.0000 mg | ORAL_TABLET | Freq: Two times a day (BID) | ORAL | 3 refills | Status: DC

## 2020-11-29 NOTE — Telephone Encounter (Signed)
Returned call to patient. Made patient aware of the following regarding her recent heart monitor results.   Minus Breeding, MD  11/27/2020  2:26 PM EDT      She does have some supraventricular runs that might be causing symptoms.  We could try a low dose of beta blocker (25 mg bid metoprolol tartrate ) if she thinks she needs symptomatic treatment.  No sustained arrhythmias.  Call Ms.Heggs with the results and send results to Erven Colla, DO    Patient would like to try to Metoprolol for symptoms. Advised patient that prescription for Metoprolol Tartrate 25mg  Twice daily has been sent in to patient preferred pharmacy.    Advised patient to call back to office with any issues, questions, or concerns. Patient verbalized understanding.

## 2020-11-29 NOTE — Telephone Encounter (Signed)
Follow up:     Patient returning a call back to the  nurse.

## 2020-12-01 NOTE — Telephone Encounter (Signed)
Spoke to patient . Explain that  both medication would be alright to take .  The main reason for taking Metoprolol  25 mg twice a day is to help with symptoms of heart rhythm ( irregular beats) .   Amlodipine 10 mg for blood pressure control.  Patient voice understanding.  She will continue to monitor until  follow up appointment with Dr Percival Spanish.

## 2020-12-04 ENCOUNTER — Other Ambulatory Visit: Payer: Self-pay | Admitting: Physician Assistant

## 2020-12-07 ENCOUNTER — Ambulatory Visit (INDEPENDENT_AMBULATORY_CARE_PROVIDER_SITE_OTHER): Payer: PPO | Admitting: Physical Medicine and Rehabilitation

## 2020-12-07 ENCOUNTER — Encounter: Payer: Self-pay | Admitting: Physical Medicine and Rehabilitation

## 2020-12-07 ENCOUNTER — Other Ambulatory Visit: Payer: Self-pay

## 2020-12-07 ENCOUNTER — Ambulatory Visit: Payer: Self-pay

## 2020-12-07 DIAGNOSIS — M461 Sacroiliitis, not elsewhere classified: Secondary | ICD-10-CM

## 2020-12-07 MED ORDER — BUPIVACAINE HCL 0.5 % IJ SOLN
2.0000 mL | INTRAMUSCULAR | Status: AC | PRN
  Administered 2020-12-07: 2 mL via INTRA_ARTICULAR

## 2020-12-07 MED ORDER — METHYLPREDNISOLONE ACETATE 80 MG/ML IJ SUSP
80.0000 mg | INTRAMUSCULAR | Status: AC | PRN
  Administered 2020-12-07: 80 mg via INTRA_ARTICULAR

## 2020-12-07 NOTE — Progress Notes (Signed)
Pt state left pelvic pain. Pt state walking, standing and sitting makes the pain worse. Pt state she takes pain meds and uses ice packs to help ease her pain.  Numeric Pain Rating Scale and Functional Assessment Average Pain 6   In the last MONTH (on 0-10 scale) has pain interfered with the following?  1. General activity like being  able to carry out your everyday physical activities such as walking, climbing stairs, carrying groceries, or moving a chair?  Rating(9)   -BT, -Dye Allergies.

## 2020-12-07 NOTE — Progress Notes (Signed)
Sharon [name] - 65 y.o. female MRN [id number]  Date of birth: [birth year]  Office Visit Note: Visit Date: 12/07/2020 PCP: Sharon Colla, DO Referred by: Sharon Colla, DO  Subjective: Chief Complaint  Patient presents with   Left Hip - Pain   HPI:  Sharon [name] is a 65 y.o. female who comes in today for planned repeat Left anesthetic Sacroiliac arthrogram with fluoroscopic guidance.  The patient has failed conservative care including home exercise, medications, time and activity modification. Prior injection gave more than 50% relief for several months. This injection will be diagnostic and hopefully therapeutic.  Please see requesting physician notes for further details and justification.  Referring: Dr. Jean [name]   ROS Otherwise per HPI.  Assessment & Plan: Visit Diagnoses:    ICD-10-CM   1. Sacroiliitis (HCC)  M46.1 Sacroiliac Joint Inj    XR C-ARM NO REPORT      Plan: No additional findings.   Meds & Orders: No orders of the defined types were placed in this encounter.   Orders Placed This Encounter  Procedures   Sacroiliac Joint Inj   XR C-ARM NO REPORT    Follow-up: No follow-ups on file.   Procedures: Sacroiliac Joint Inj on 12/07/2020 10:45 AM Indications: pain and diagnostic evaluation Details: 22 G 3.5 in needle, fluoroscopy-guided posterior approach Medications: 2 mL bupivacaine 0.5 %; 80 mg methylPREDNISolone acetate 80 MG/ML Outcome: tolerated well, no immediate complications  Sacroiliac Joint Intra-Articular Injection - Posterior Approach with Fluoroscopic Guidance   Position: PRONE  Additional Comments: Vital signs were monitored before and after the procedure. Patient was prepped and draped in the usual sterile fashion. The correct patient, procedure, and site was verified.   Injection Procedure Details:   Location/Site:  Sacroiliac joint  Needle size: 3.5 in Spinal Needle  Needle type: Spinal  Needle Placement:  Intra-articular  Findings:  -Comments: There was excellent flow of contrast producing a partial arthrogram of the sacroiliac joint.   Procedure Details: Starting with a 90 degree vertical and midline orientation the fluoroscope was tilted cranially 20 to 25 degrees and the target area of the inferior most part of the SI joint on the side mentioned above was visualized.  The soft tissues overlying this target were infiltrated with 4 ml. of 1% Lidocaine without Epinephrine. A #22 gauge spinal needle was inserted perpendicular to the fluoroscope table and advanced into the posterior inferior joint space using fluoroscopic guidance.  Position in the joint space was confirmed by obtaining a partial arthrogram using a 2 ml. volume of Isovue-250 contrast agent. After negative aspirate for gross pus or blood, the injectate was delivered to the joint. Radiographs were obtained for documentation purposes.   Additional Comments:   Dressing: Bandaid    Post-procedure details: Patient was observed during the procedure. Post-procedure instructions were reviewed.  Patient left the clinic in stable condition.    There was excellent flow of contrast producing a partial arthrogram of the sacroiliac joint.  Procedure, treatment alternatives, risks and benefits explained, specific risks discussed. Consent was given by the patient. Immediately prior to procedure a time out was called to verify the correct patient, procedure, equipment, support staff and site/side marked as required. Patient was prepped and draped in the usual sterile fashion.         Clinical History: MRI LUMBAR SPINE WITHOUT CONTRAST   TECHNIQUE: Multiplanar, multisequence MR imaging of the lumbar spine was performed. No intravenous contrast was administered.   COMPARISON:  10/13/2012  FINDINGS: Segmentation:  Standard.   Alignment:  Normal.   Vertebrae: No fracture, suspicious osseous lesion, or significant marrow edema.  Mild chronic degenerative endplate changes at S8-O7.   Conus medullaris and cauda equina: Conus extends to the L1-2 level. Conus and cauda equina appear normal.   Paraspinal and other soft tissues: Unremarkable.   Disc levels:   Disc desiccation throughout the lumbar spine. Mild disc space narrowing at L3-4 and L5-S1.   T12-L1: Mild facet arthrosis without disc herniation or stenosis, unchanged.   L1-2: Mild facet arthrosis without disc herniation or stenosis, unchanged.   L2-3: Mild-to-moderate right and mild left facet arthrosis and minimal disc bulging without stenosis, unchanged.   L3-4: Mild disc bulging and mild-to-moderate facet arthrosis, slightly progressed but without stenosis.   L4-5: Mild disc bulging and mild-to-moderate facet arthrosis result in borderline to mild left lateral recess stenosis without spinal or neural foraminal stenosis, stable to at most minimally progressed.   L5-S1: Disc bulging greater to the left and mild facet arthrosis result in new mild left lateral recess stenosis without significant spinal or neural foraminal stenosis.   IMPRESSION: Slight progression of lumbar disc and facet degeneration without high-grade stenosis. Mild left lateral recess stenosis at L4-5 and L5-S1.     Electronically Signed   By: Sharon [name] M.D.   On: 10/26/2017 14:09     Objective:  VS:  HT:    WT:   BMI:     BP:   HR: bpm  TEMP: ( )  RESP:  Physical Exam Musculoskeletal:     Comments: Positive Fortin Finger sign, Positive Patrick's all on the left. Ambulated with cane.     Imaging: XR C-ARM NO REPORT  Result Date: 12/07/2020 Please see Notes tab for imaging impression.

## 2020-12-20 DIAGNOSIS — G894 Chronic pain syndrome: Secondary | ICD-10-CM | POA: Diagnosis not present

## 2020-12-20 DIAGNOSIS — E559 Vitamin D deficiency, unspecified: Secondary | ICD-10-CM | POA: Diagnosis not present

## 2020-12-20 DIAGNOSIS — Z6841 Body Mass Index (BMI) 40.0 and over, adult: Secondary | ICD-10-CM | POA: Diagnosis not present

## 2020-12-20 DIAGNOSIS — M25552 Pain in left hip: Secondary | ICD-10-CM | POA: Diagnosis not present

## 2020-12-20 DIAGNOSIS — Z79899 Other long term (current) drug therapy: Secondary | ICD-10-CM | POA: Diagnosis not present

## 2021-01-01 ENCOUNTER — Encounter (HOSPITAL_COMMUNITY): Payer: Self-pay | Admitting: Orthopaedic Surgery

## 2021-01-01 ENCOUNTER — Encounter: Payer: Self-pay | Admitting: Orthopaedic Surgery

## 2021-01-01 ENCOUNTER — Ambulatory Visit (INDEPENDENT_AMBULATORY_CARE_PROVIDER_SITE_OTHER): Payer: PPO

## 2021-01-01 ENCOUNTER — Ambulatory Visit: Payer: PPO | Admitting: Orthopaedic Surgery

## 2021-01-01 ENCOUNTER — Other Ambulatory Visit: Payer: Self-pay

## 2021-01-01 ENCOUNTER — Other Ambulatory Visit: Payer: Self-pay | Admitting: Orthopaedic Surgery

## 2021-01-01 DIAGNOSIS — T8452XA Infection and inflammatory reaction due to internal left hip prosthesis, initial encounter: Secondary | ICD-10-CM

## 2021-01-01 DIAGNOSIS — Z96642 Presence of left artificial hip joint: Secondary | ICD-10-CM

## 2021-01-01 NOTE — Progress Notes (Signed)
PCP - Elvia Collum, DO Cardiologist - Minus Breeding, MD  PPM/ICD - denies Device Orders - N/A Rep Notified - N/A  Chest x-ray - N/A EKG - 10/13/2020 Stress Test - denies ECHO - 11/14/2020 Cardiac Cath - denies  Sleep Study - denies CPAP - N/A  Fasting Blood Sugar - N/A  Blood Thinner Instructions: N/A  Aspirin Instructions: Patient was instructed: As of today, STOP taking any Aspirin (unless otherwise instructed by your surgeon) Aleve, Naproxen, Ibuprofen, Motrin, Advil, Goody's, BC's, all herbal medications, fish oil, and all vitamins.  Patient was instructed to take tomorrow morning with a small sip of water: Norvasc, Metoprolol, Allegra, Gabapentin. If needed, patient can have: Norco, Robaxin, Zofran, Albuterol.   ERAS Protcol - yes. Patient was instructed to be not have anything by mouth after midnight. Patient can have clear liquids until 9 AM tomorrow morning. Patient verbalized understanding.   COVID TEST- the test was done in MD office today, 01/01/2021 per patient   Anesthesia review: the chart will be review by PA-C   Patient denies shortness of breath, fever, cough and chest pain at PAT appointment  All instructions explained to the patient, with a verbal understanding of the material.  The opportunity to ask questions was provided.

## 2021-01-01 NOTE — Progress Notes (Signed)
The patient is well-known to me.  We performed a left total hip arthroplasty in November of last year.  Her postoperative course was complicated with a soft tissue wound that eventually healed without any surgical intervention.  She is someone who at the time of surgery had a BMI of well over 40.  We had to petition the insurance company to get approval for her surgery which she did get approval for because she had lost significant weight.  She continues on a weight loss journey and has lost more weight.  However, over this weekend she developed drainage from her left hip incision area.  On my exam today there is definitely a small wound with some purulent drainage.  There is firmness of the soft tissue and she said that has been present more recently.  She denies any fever and chills.  She did have COVID earlier this year and is suffering from some long-term effects of having COVID.  She understands and I am quite concerned about her left hip given the drainage from the wound.  X-rays today in the office show a periosteal reaction around the proximal femur that I did not see on previous films earlier this year.  She will need to be n.p.o. after midnight tonight.  I will put her on the add-on schedule tomorrow for an open irrigation debridement of the soft tissues around the hip as well as a deep I&D.  We will need to change out the polyethylene liner and hip ball and placed antibiotic powder deep in the wound.  She will likely need a PICC line and long-term antibiotics in an attempt to salvage this hip.  I explained this to her and fully and she understands her urgency and need to proceed with surgery.

## 2021-01-01 NOTE — Anesthesia Preprocedure Evaluation (Addendum)
Anesthesia Evaluation  Patient identified by MRN, date of birth, ID band Patient awake    Reviewed: Allergy & Precautions, NPO status , Patient's Chart, lab work & pertinent test results  History of Anesthesia Complications (+) history of anesthetic complications  Airway Mallampati: II  TM Distance: >3 FB Neck ROM: Full    Dental  (+) Dental Advisory Given   Pulmonary shortness of breath, asthma , former smoker,    Pulmonary exam normal breath sounds clear to auscultation       Cardiovascular hypertension, Pt. on home beta blockers and Pt. on medications + angina Normal cardiovascular exam+ dysrhythmias  Rhythm:Regular Rate:Normal     Neuro/Psych  Headaches,  Neuromuscular disease    GI/Hepatic Neg liver ROS, GERD  ,  Endo/Other  Morbid obesity  Renal/GU negative Renal ROS     Musculoskeletal  (+) Arthritis ,   Abdominal (+) + obese,   Peds  Hematology negative hematology ROS (+)   Anesthesia Other Findings   Reproductive/Obstetrics                                                            Anesthesia Evaluation  Patient identified by MRN, date of birth, ID band Patient awake    Reviewed: Allergy & Precautions, NPO status , Patient's Chart, lab work & pertinent test results  Airway Mallampati: II  TM Distance: >3 FB Neck ROM: Full    Dental no notable dental hx.    Pulmonary asthma , former smoker,    Pulmonary exam normal breath sounds clear to auscultation       Cardiovascular negative cardio ROS Normal cardiovascular exam Rhythm:Regular Rate:Normal     Neuro/Psych  Headaches, negative psych ROS   GI/Hepatic Neg liver ROS, GERD  Medicated and Controlled,  Endo/Other  Morbid obesity  Renal/GU negative Renal ROS     Musculoskeletal  (+) Arthritis ,   Abdominal (+) + obese,   Peds  Hematology negative hematology ROS (+)   Anesthesia Other  Findings osteoarthritis left hip  Reproductive/Obstetrics                            Anesthesia Physical Anesthesia Plan  ASA: III  Anesthesia Plan: Spinal   Post-op Pain Management:    Induction: Intravenous  PONV Risk Score and Plan: 2 and Ondansetron, Dexamethasone, Propofol infusion and Treatment may vary due to age or medical condition  Airway Management Planned: Simple Face Mask  Additional Equipment:   Intra-op Plan:   Post-operative Plan:   Informed Consent: I have reviewed the patients History and Physical, chart, labs and discussed the procedure including the risks, benefits and alternatives for the proposed anesthesia with the patient or authorized representative who has indicated his/her understanding and acceptance.     Dental advisory given  Plan Discussed with: CRNA  Anesthesia Plan Comments:         Anesthesia Quick Evaluation  Anesthesia Physical Anesthesia Plan  ASA: 3  Anesthesia Plan: General   Post-op Pain Management:    Induction: Intravenous  PONV Risk Score and Plan: 4 or greater and Ondansetron, Dexamethasone, Treatment may vary due to age or medical condition, Midazolam, Diphenhydramine and Propofol infusion  Airway Management Planned: Oral ETT  Additional Equipment:   Intra-op Plan:  Post-operative Plan: Extubation in OR  Informed Consent: I have reviewed the patients History and Physical, chart, labs and discussed the procedure including the risks, benefits and alternatives for the proposed anesthesia with the patient or authorized representative who has indicated his/her understanding and acceptance.       Plan Discussed with: CRNA  Anesthesia Plan Comments: (PAT note written 01/01/2021 by Myra Gianotti, PA-C. )     Anesthesia Quick Evaluation

## 2021-01-01 NOTE — Progress Notes (Signed)
Anesthesia Chart Review: Sharon [name],   Case: [id number] Date/Time: 01/02/21 1159   Procedure: IRRIGATION AND DEBRIDEMENT LEFT HIP WITH HIP BALL AND POLY ETHYLENE LINER EXCHANGE. (Left: Hip) - needs RNFA   Anesthesia type: General   Pre-op diagnosis: left hip infection   Location: [place] [facility] ADD ON ROOM 01 / [facility]   Surgeons: Mcarthur Rossetti, MD       DISCUSSION: Patient is a 65 year old female scheduled for the above procedure. S/p left THA 04/14/20. She required wound care for wound dehiscence, but the wound eventually healed.  She was reevaluated by Dr. Ninfa Linden on 01/01/21 due to left hip drainage. He notes, x-rays done in the office showed a periosteal reaction around the proximal femur that was not seen on films earlier in the year.  He recommended open irrigation debridement of the soft tissues around the hip as well as deep I&D with change out of the polyethylene liner and hip ball and place antibiotic powder deep in the wound.  He discussed that she will likely need a PICC line and long-term antibiotics in an attempt to salvage her hip. Surgery posted urgently for 01/02/21.   Other history includes former smoker (quit 05/27/06), HTN, dyspnea, dysrhythmia (PVCs, runs of SVT 10/2020 Ziopatch, Rx metoprolol), skin cancer (melanoma excision left high 2009), asthma, fatty liver, GERD. Reported confusion for a few days after previous knee surgeries, but has done well with spinal anesthesia.    Evaluation by cardiologist Dr. Percival Spanish on 10/13/2020.  She was seen for palpitations. She had COVID-19 ~ 06/2020 (not requiring hospitalization).  By notes, she had been walking a mile earlier in the year. Post-COVID she developed some palpitations and elevated blood pressure readings. He notes she may even have chest discomfort for several minutes when her heart is racing . She was started on amlodipine for HTN. Thyroid labs and metanephrines were normal in April 2022. He ordered BNP, CRP, ESR, as well as an  echo and 2 week event monitor. BNP and ESR normal. CRP elevated at 34 (0-10).  Echo showed normal LVEF with no significant valvular abnormalities.  Zio patch showed some supraventricular runs that could be causing palpitations so advised metoprolol to tartrate 25 mg twice daily for symptomatic treatment, but otherwise has not recommended additional testing at this time. Her next follow-up is scheduled for 01/08/2021. No current chest pain or SOB per PAT RN phone interview.  She is a same-day work-up, so labs and anesthesia team evaluation on the day of surgery.  VS:  BP Readings from Last 3 Encounters:  10/13/20 138/64  10/05/20 138/80  09/01/20 (!) 143/83   Pulse Readings from Last 3 Encounters:  10/13/20 98  10/05/20 80  09/01/20 82     PROVIDERS: Erven Colla, DO is PCP  Minus Breeding, MD is cardiologist   LABS: On the day of surgery as indicated. As of 08/29/20, Cr 0.68. H/H 12.0/36.3 on 04/15/20.    IMAGES: MRI L-spine 11/17/20: IMPRESSION: 1. Mild lumbar spine spondylosis as described above. No significant interval change compared with 10/26/2017. 2. No acute osseous injury of the lumbar spine.    EKG: 10/13/20 ([facility]): Sinus rhythm with premature ventricular complexes or fusion complexes   CV: ZioPatch Long term monitor 11/01/20-6/22-22: Patch Wear Time: 14 days and 0 hours (2022-06-08T15:18:49-0400 to 2022-06-22T15:18:53-0400) Patient had a min HR of 49 bpm, max HR of 153 bpm, and avg HR of 81 bpm. Predominant underlying rhythm was Sinus Rhythm. 3 Supraventricular Tachycardia runs occurred, the  run with the fastest interval lasting 12 beats with a max rate of 124 bpm (avg 106 bpm); the run with the fastest interval was also the longest. Isolated SVEs were rare (<1.0%), SVE Couplets were rare (<1.0%), and no SVE Triplets were present. Isolated VEs were frequent (6.9%, [id number]), VE Couplets were rare (<1.0%, 7), and no VE Triplets were present. Ventricular Bigeminy  and Trigeminy were present.   The predominant rhythm is sinus Few brief runs of supraventricular tachycardia The longest run was 12 beats Ventricular bigeminy and trigeminy were present. No sustained arrythmias - Dr. Percival Spanish reviewed on 11/27/20 and wrote, "She does have some supraventricular runs that might be causing symptoms.  We could try a low dose of beta blocker (25 mg bid metoprolol tartrate ) if shethinks she needs symptomatic treatment.  No sustained arrhythmias."   Echo 11/14/20: IMPRESSIONS   1. Left ventricular ejection fraction, by estimation, is 60 to 65%. The  left ventricle has normal function. The left ventricle has no regional  wall motion abnormalities. Left ventricular diastolic parameters are  indeterminate.   2. Right ventricular systolic function is normal. The right ventricular  size is normal. There is normal pulmonary artery systolic pressure.   3. The mitral valve is grossly normal. Trivial mitral valve  regurgitation.   4. The aortic valve is normal in structure. Aortic valve regurgitation is  not visualized. No aortic stenosis is present.    Past Medical History:  Diagnosis Date   Anginal pain ([place])    in February 8916   Biliary colic    Cancer ([place])    melanoma: lt. inner thight   Carpal tunnel syndrome    bilateral   Chronic allergic rhinitis    Chronic urticaria    Complication of anesthesia    confusion for a couple of days after waking after having multiple knee surgery's, does well with spinal   Degenerative disk disease    Diverticulosis of colon    Dyspnea    Dysrhythmia    February 2022   Fatty liver    Fibrocystic breast disease 1996   GERD (gastroesophageal reflux disease)    History of colon polyps    HYPERPLASTIC 09-14-2015   History of kidney stones    passed on own   History of melanoma excision    left thigh ,inside 2009--- per pt also has had severeal excision of office for almost melanoma   Hypertension    Migraine     since age 28 not as frequent now   Moderate persistent asthma, uncomplicated    followed by dr gallagher (cone allergy and asthma)   OA (osteoarthritis)    all joints   SUI (stress urinary incontinence, female)     Past Surgical History:  Procedure Laterality Date   APPENDECTOMY  1973   BREAST EXCISIONAL BIOPSY Right 1994;  10-01-2009   2011 per path mild ductal hyperplasia/ fibrocystic breast   BREAST SURGERY     CHOLECYSTECTOMY N/A 01/30/2017   Procedure: LAPAROSCOPIC CHOLECYSTECTOMY;  Surgeon: Mickeal Skinner, MD;  Location: [facility];  Service: General;  Laterality: N/A;   COLONOSCOPY N/A 09/14/2015   Procedure: COLONOSCOPY;  Surgeon: Daneil Dolin, MD;  Location: [facility];  Service: Endoscopy;  Laterality: N/A;  930 - moved to 9:15 - office to notify   ESOPHAGOGASTRODUODENOSCOPY (EGD) WITH PROPOFOL N/A 11/29/2016   Procedure: ESOPHAGOGASTRODUODENOSCOPY (EGD) WITH PROPOFOL;  Surgeon: Doran Stabler, MD;  Location: [facility];  Service: Gastroenterology;  Laterality: N/A;   JOINT REPLACEMENT     knee replacement and left hip replacement   KNEE ARTHROSCOPY W/ MENISCECTOMY Bilateral left 12-19-2006;  right 01-26-2010   LAPAROSCOPIC GASTRIC SLEEVE RESECTION N/A 08/16/2019   Procedure: LAPAROSCOPIC GASTRIC SLEEVE RESECTION, Upper Endo, ERAS Pathway;  Surgeon: Kinsinger, Arta Bruce, MD;  Location: [facility];  Service: General;  Laterality: N/A;   [place] Left 04/14/2020   Procedure: LEFT TOTAL HIP ARTHROPLASTY ANTERIOR APPROACH;  Surgeon: Mcarthur Rossetti, MD;  Location: [facility];  Service: Orthopedics;  Laterality: Left;   TOTAL KNEE ARTHROPLASTY Left 03/10/2007   TOTAL KNEE ARTHROPLASTY  06/17/2011   Procedure: TOTAL KNEE ARTHROPLASTY;  Surgeon: Arther Abbott, MD;  Location: [facility];  Service: Orthopedics;  Laterality: Right;  [place]   TOTAL KNEE REVISION Left 07/08/2010  (spinal anesthesia)   TUBAL LIGATION  Bilateral YRS AGO   VAGINAL HYSTERECTOMY  1988    MEDICATIONS: No current facility-administered medications for this encounter.    albuterol (PROVENTIL HFA;VENTOLIN HFA) 108 (90 Base) MCG/ACT inhaler   albuterol (PROVENTIL) (2.5 MG/3ML) 0.083% nebulizer solution   amLODipine (NORVASC) 10 MG tablet   Calcium-Vitamin D-Vitamin K (VIACTIV CALCIUM PLUS D) 650-12.5-40 MG-MCG-MCG CHEW   docusate sodium (COLACE) 100 MG capsule   fexofenadine (ALLEGRA) 180 MG tablet   fluticasone (FLONASE) 50 MCG/ACT nasal spray   gabapentin (NEURONTIN) 300 MG capsule   HYDROcodone-acetaminophen (NORCO) 10-325 MG tablet   Lidocaine HCl-Benzyl Alcohol (SALONPAS LIDOCAINE PLUS EX)   methocarbamol (ROBAXIN) 500 MG tablet   metoprolol tartrate (LOPRESSOR) 25 MG tablet   Multiple Vitamins-Minerals (BARIATRIC MULTIVITAMINS/IRON PO)   nabumetone (RELAFEN) 750 MG tablet   ondansetron (ZOFRAN-ODT) 8 MG disintegrating tablet   pantoprazole (PROTONIX) 40 MG tablet   Myra Gianotti, PA-C Surgical Short Stay/Anesthesiology [facility] Phone [phone] [facility] Phone [phone] 01/01/2021 5:22 PM

## 2021-01-02 ENCOUNTER — Inpatient Hospital Stay (HOSPITAL_COMMUNITY): Payer: PPO | Admitting: Vascular Surgery

## 2021-01-02 ENCOUNTER — Inpatient Hospital Stay (HOSPITAL_COMMUNITY)
Admission: RE | Admit: 2021-01-02 | Discharge: 2021-01-09 | DRG: 468 | Disposition: A | Discharge status: Home Health | Payer: PPO | Attending: Orthopaedic Surgery | Admitting: Orthopaedic Surgery

## 2021-01-02 ENCOUNTER — Encounter (HOSPITAL_COMMUNITY): Payer: Self-pay | Admitting: Orthopaedic Surgery

## 2021-01-02 ENCOUNTER — Inpatient Hospital Stay (HOSPITAL_COMMUNITY): Payer: PPO

## 2021-01-02 ENCOUNTER — Encounter (HOSPITAL_COMMUNITY)
Admission: RE | Disposition: A | Discharge status: Home Health | Payer: Self-pay | Source: Home / Self Care | Attending: Orthopaedic Surgery

## 2021-01-02 DIAGNOSIS — Z91040 Latex allergy status: Secondary | ICD-10-CM | POA: Diagnosis not present

## 2021-01-02 DIAGNOSIS — Z8616 Personal history of COVID-19: Secondary | ICD-10-CM

## 2021-01-02 DIAGNOSIS — L509 Urticaria, unspecified: Secondary | ICD-10-CM | POA: Diagnosis not present

## 2021-01-02 DIAGNOSIS — K219 Gastro-esophageal reflux disease without esophagitis: Secondary | ICD-10-CM | POA: Diagnosis present

## 2021-01-02 DIAGNOSIS — Z8249 Family history of ischemic heart disease and other diseases of the circulatory system: Secondary | ICD-10-CM | POA: Diagnosis not present

## 2021-01-02 DIAGNOSIS — J4540 Moderate persistent asthma, uncomplicated: Secondary | ICD-10-CM | POA: Diagnosis present

## 2021-01-02 DIAGNOSIS — T8452XA Infection and inflammatory reaction due to internal left hip prosthesis, initial encounter: Principal | ICD-10-CM | POA: Diagnosis present

## 2021-01-02 DIAGNOSIS — Z9851 Tubal ligation status: Secondary | ICD-10-CM | POA: Diagnosis not present

## 2021-01-02 DIAGNOSIS — I10 Essential (primary) hypertension: Secondary | ICD-10-CM | POA: Diagnosis not present

## 2021-01-02 DIAGNOSIS — Z471 Aftercare following joint replacement surgery: Secondary | ICD-10-CM | POA: Diagnosis not present

## 2021-01-02 DIAGNOSIS — Y831 Surgical operation with implant of artificial internal device as the cause of abnormal reaction of the patient, or of later complication, without mention of misadventure at the time of the procedure: Secondary | ICD-10-CM | POA: Diagnosis present

## 2021-01-02 DIAGNOSIS — Z86010 Personal history of colonic polyps: Secondary | ICD-10-CM | POA: Diagnosis not present

## 2021-01-02 DIAGNOSIS — R509 Fever, unspecified: Secondary | ICD-10-CM | POA: Diagnosis not present

## 2021-01-02 DIAGNOSIS — M009 Pyogenic arthritis, unspecified: Secondary | ICD-10-CM | POA: Diagnosis not present

## 2021-01-02 DIAGNOSIS — B964 Proteus (mirabilis) (morganii) as the cause of diseases classified elsewhere: Secondary | ICD-10-CM | POA: Diagnosis present

## 2021-01-02 DIAGNOSIS — Z880 Allergy status to penicillin: Secondary | ICD-10-CM | POA: Diagnosis not present

## 2021-01-02 DIAGNOSIS — Z9049 Acquired absence of other specified parts of digestive tract: Secondary | ICD-10-CM

## 2021-01-02 DIAGNOSIS — Z96653 Presence of artificial knee joint, bilateral: Secondary | ICD-10-CM | POA: Diagnosis present

## 2021-01-02 DIAGNOSIS — Z91048 Other nonmedicinal substance allergy status: Secondary | ICD-10-CM

## 2021-01-02 DIAGNOSIS — Z96642 Presence of left artificial hip joint: Secondary | ICD-10-CM | POA: Diagnosis present

## 2021-01-02 DIAGNOSIS — Z87891 Personal history of nicotine dependence: Secondary | ICD-10-CM

## 2021-01-02 DIAGNOSIS — J309 Allergic rhinitis, unspecified: Secondary | ICD-10-CM | POA: Diagnosis not present

## 2021-01-02 DIAGNOSIS — Z90710 Acquired absence of both cervix and uterus: Secondary | ICD-10-CM | POA: Diagnosis not present

## 2021-01-02 DIAGNOSIS — G43909 Migraine, unspecified, not intractable, without status migrainosus: Secondary | ICD-10-CM | POA: Diagnosis not present

## 2021-01-02 DIAGNOSIS — T8452XD Infection and inflammatory reaction due to internal left hip prosthesis, subsequent encounter: Secondary | ICD-10-CM | POA: Diagnosis not present

## 2021-01-02 DIAGNOSIS — Z20822 Contact with and (suspected) exposure to covid-19: Secondary | ICD-10-CM | POA: Diagnosis present

## 2021-01-02 DIAGNOSIS — Z6839 Body mass index (BMI) 39.0-39.9, adult: Secondary | ICD-10-CM

## 2021-01-02 DIAGNOSIS — Z85820 Personal history of malignant melanoma of skin: Secondary | ICD-10-CM

## 2021-01-02 DIAGNOSIS — Z419 Encounter for procedure for purposes other than remedying health state, unspecified: Secondary | ICD-10-CM

## 2021-01-02 DIAGNOSIS — Z79899 Other long term (current) drug therapy: Secondary | ICD-10-CM

## 2021-01-02 DIAGNOSIS — Z87442 Personal history of urinary calculi: Secondary | ICD-10-CM | POA: Diagnosis not present

## 2021-01-02 DIAGNOSIS — M5137 Other intervertebral disc degeneration, lumbosacral region: Secondary | ICD-10-CM | POA: Diagnosis not present

## 2021-01-02 DIAGNOSIS — Z888 Allergy status to other drugs, medicaments and biological substances status: Secondary | ICD-10-CM

## 2021-01-02 HISTORY — DX: Angina pectoris, unspecified: I20.9

## 2021-01-02 HISTORY — DX: Dyspnea, unspecified: R06.00

## 2021-01-02 HISTORY — DX: Cardiac arrhythmia, unspecified: I49.9

## 2021-01-02 HISTORY — PX: ANTERIOR HIP REVISION: SHX6527

## 2021-01-02 HISTORY — PX: APPLICATION OF WOUND VAC: SHX5189

## 2021-01-02 HISTORY — DX: Essential (primary) hypertension: I10

## 2021-01-02 LAB — SARS CORONAVIRUS 2 (TAT 6-24 HRS): SARS Coronavirus 2: INVALID

## 2021-01-02 LAB — CBC
HCT: 42.3 % (ref 36.0–46.0)
Hemoglobin: 13.7 g/dL (ref 12.0–15.0)
MCH: 31.8 pg (ref 26.0–34.0)
MCHC: 32.4 g/dL (ref 30.0–36.0)
MCV: 98.1 fL (ref 80.0–100.0)
Platelets: 448 10*3/uL — ABNORMAL HIGH (ref 150–400)
RBC: 4.31 MIL/uL (ref 3.87–5.11)
RDW: 13.1 % (ref 11.5–15.5)
WBC: 10 10*3/uL (ref 4.0–10.5)
nRBC: 0 % (ref 0.0–0.2)

## 2021-01-02 LAB — BASIC METABOLIC PANEL
Anion gap: 13 (ref 5–15)
BUN: 13 mg/dL (ref 8–23)
CO2: 21 mmol/L — ABNORMAL LOW (ref 22–32)
Calcium: 9.2 mg/dL (ref 8.9–10.3)
Chloride: 103 mmol/L (ref 98–111)
Creatinine, Ser: 0.69 mg/dL (ref 0.44–1.00)
GFR, Estimated: >60 mL/min (ref >60)
Glucose, Bld: 88 mg/dL (ref 70–99)
Potassium: 4.4 mmol/L (ref 3.5–5.1)
Sodium: 137 mmol/L (ref 135–145)

## 2021-01-02 LAB — SARS CORONAVIRUS 2 BY RT PCR (HOSPITAL ORDER, PERFORMED IN ~~LOC~~ HOSPITAL LAB): SARS Coronavirus 2: NEGATIVE

## 2021-01-02 SURGERY — REVISION, TOTAL ARTHROPLASTY, HIP, ANTERIOR APPROACH
Anesthesia: General | Site: Hip | Laterality: Left

## 2021-01-02 MED ORDER — CHLORHEXIDINE GLUCONATE 0.12 % MT SOLN
15.0000 mL | Freq: Once | OROMUCOSAL | Status: AC
  Administered 2021-01-02: 15 mL via OROMUCOSAL
  Filled 2021-01-02: qty 15

## 2021-01-02 MED ORDER — VANCOMYCIN HCL IN DEXTROSE 1-5 GM/200ML-% IV SOLN
1000.0000 mg | Freq: Two times a day (BID) | INTRAVENOUS | Status: DC
  Administered 2021-01-03 – 2021-01-05 (×5): 1000 mg via INTRAVENOUS
  Filled 2021-01-02 (×6): qty 200

## 2021-01-02 MED ORDER — ONDANSETRON HCL 4 MG/2ML IJ SOLN: 4.0000 mg | Freq: Four times a day (QID) | INTRAMUSCULAR | Status: DC | PRN

## 2021-01-02 MED ORDER — ONDANSETRON HCL 4 MG PO TABS
4.0000 mg | ORAL_TABLET | Freq: Four times a day (QID) | ORAL | Status: DC | PRN
  Administered 2021-01-05: 4 mg via ORAL
  Filled 2021-01-02: qty 1

## 2021-01-02 MED ORDER — SODIUM CHLORIDE 0.9 % IR SOLN
Status: DC | PRN
  Administered 2021-01-02: 6000 mL

## 2021-01-02 MED ORDER — SODIUM CHLORIDE 0.9 % IV SOLN: INTRAVENOUS | Status: DC

## 2021-01-02 MED ORDER — IRRISEPT - 450ML BOTTLE WITH 0.05% CHG IN STERILE WATER, USP 99.95% OPTIME
TOPICAL | Status: DC | PRN
  Administered 2021-01-02: 450 mL

## 2021-01-02 MED ORDER — PANTOPRAZOLE SODIUM 40 MG PO TBEC
40.0000 mg | DELAYED_RELEASE_TABLET | Freq: Every day | ORAL | Status: DC
  Administered 2021-01-02 – 2021-01-09 (×8): 40 mg via ORAL
  Filled 2021-01-02 (×8): qty 1

## 2021-01-02 MED ORDER — HYDROMORPHONE HCL 1 MG/ML IJ SOLN
INTRAMUSCULAR | Status: AC
  Filled 2021-01-02: qty 0.5

## 2021-01-02 MED ORDER — OXYCODONE HCL 5 MG PO TABS
5.0000 mg | ORAL_TABLET | ORAL | Status: DC | PRN
  Administered 2021-01-02 – 2021-01-03 (×2): 10 mg via ORAL
  Administered 2021-01-08: 5 mg via ORAL
  Administered 2021-01-09: 10 mg via ORAL
  Filled 2021-01-02 (×6): qty 2

## 2021-01-02 MED ORDER — ORAL CARE MOUTH RINSE: 15.0000 mL | Freq: Once | OROMUCOSAL | Status: AC

## 2021-01-02 MED ORDER — 0.9 % SODIUM CHLORIDE (POUR BTL) OPTIME
TOPICAL | Status: DC | PRN
  Administered 2021-01-02: 1000 mL

## 2021-01-02 MED ORDER — DIPHENHYDRAMINE HCL 12.5 MG/5ML PO ELIX
12.5000 mg | ORAL_SOLUTION | ORAL | Status: DC | PRN
  Administered 2021-01-03: 25 mg via ORAL
  Filled 2021-01-02: qty 10

## 2021-01-02 MED ORDER — FLUTICASONE PROPIONATE 50 MCG/ACT NA SUSP
2.0000 | Freq: Every day | NASAL | Status: DC
  Administered 2021-01-02 – 2021-01-08 (×5): 2 via NASAL
  Filled 2021-01-02: qty 16

## 2021-01-02 MED ORDER — PROMETHAZINE HCL 25 MG/ML IJ SOLN: 6.2500 mg | INTRAMUSCULAR | Status: DC | PRN

## 2021-01-02 MED ORDER — PROPOFOL 500 MG/50ML IV EMUL
INTRAVENOUS | Status: DC | PRN
  Administered 2021-01-02: 100 ug/kg/min via INTRAVENOUS

## 2021-01-02 MED ORDER — DOCUSATE SODIUM 100 MG PO CAPS
100.0000 mg | ORAL_CAPSULE | Freq: Two times a day (BID) | ORAL | Status: DC
  Administered 2021-01-02 – 2021-01-09 (×13): 100 mg via ORAL
  Filled 2021-01-02 (×14): qty 1

## 2021-01-02 MED ORDER — HYDROMORPHONE HCL 1 MG/ML IJ SOLN
INTRAMUSCULAR | Status: AC
  Filled 2021-01-02: qty 1

## 2021-01-02 MED ORDER — MIDAZOLAM HCL 2 MG/2ML IJ SOLN
INTRAMUSCULAR | Status: AC
  Filled 2021-01-02: qty 2

## 2021-01-02 MED ORDER — DEXAMETHASONE SODIUM PHOSPHATE 10 MG/ML IJ SOLN
INTRAMUSCULAR | Status: DC | PRN
Start: 2021-01-02 — End: 2021-01-02
  Administered 2021-01-02: 5 mg via INTRAVENOUS

## 2021-01-02 MED ORDER — HYDROMORPHONE HCL 1 MG/ML IJ SOLN
0.5000 mg | INTRAMUSCULAR | Status: DC | PRN
  Administered 2021-01-05 (×2): 1 mg via INTRAVENOUS
  Filled 2021-01-02 (×3): qty 1

## 2021-01-02 MED ORDER — ASPIRIN EC 81 MG PO TBEC
81.0000 mg | DELAYED_RELEASE_TABLET | Freq: Two times a day (BID) | ORAL | Status: DC
  Administered 2021-01-02 – 2021-01-09 (×14): 81 mg via ORAL
  Filled 2021-01-02 (×14): qty 1

## 2021-01-02 MED ORDER — METOPROLOL TARTRATE 25 MG PO TABS
25.0000 mg | ORAL_TABLET | Freq: Two times a day (BID) | ORAL | Status: DC
  Administered 2021-01-02 – 2021-01-09 (×12): 25 mg via ORAL
  Filled 2021-01-02 (×14): qty 1

## 2021-01-02 MED ORDER — VANCOMYCIN HCL 1000 MG IV SOLR
INTRAVENOUS | Status: DC | PRN
  Administered 2021-01-02: 1000 mg via TOPICAL

## 2021-01-02 MED ORDER — MEPERIDINE HCL 25 MG/ML IJ SOLN: 6.2500 mg | INTRAMUSCULAR | Status: DC | PRN

## 2021-01-02 MED ORDER — POLYETHYLENE GLYCOL 3350 17 G PO PACK
17.0000 g | PACK | Freq: Every day | ORAL | Status: DC | PRN
  Filled 2021-01-02: qty 1

## 2021-01-02 MED ORDER — METHOCARBAMOL 500 MG PO TABS
500.0000 mg | ORAL_TABLET | Freq: Four times a day (QID) | ORAL | Status: DC | PRN
  Administered 2021-01-03 – 2021-01-09 (×10): 500 mg via ORAL
  Filled 2021-01-02 (×10): qty 1

## 2021-01-02 MED ORDER — HYDROMORPHONE HCL 1 MG/ML IJ SOLN
INTRAMUSCULAR | Status: DC | PRN
  Administered 2021-01-02: .5 mg via INTRAVENOUS

## 2021-01-02 MED ORDER — DEXMEDETOMIDINE (PRECEDEX) IN NS 20 MCG/5ML (4 MCG/ML) IV SYRINGE
PREFILLED_SYRINGE | INTRAVENOUS | Status: DC | PRN
  Administered 2021-01-02: 8 ug via INTRAVENOUS

## 2021-01-02 MED ORDER — ACETAMINOPHEN 325 MG PO TABS
325.0000 mg | ORAL_TABLET | Freq: Four times a day (QID) | ORAL | Status: DC | PRN
  Administered 2021-01-03 – 2021-01-08 (×14): 650 mg via ORAL
  Filled 2021-01-02 (×14): qty 2

## 2021-01-02 MED ORDER — ROCURONIUM BROMIDE 10 MG/ML (PF) SYRINGE
PREFILLED_SYRINGE | INTRAVENOUS | Status: DC | PRN
Start: 2021-01-02 — End: 2021-01-02
  Administered 2021-01-02: 50 mg via INTRAVENOUS

## 2021-01-02 MED ORDER — LACTATED RINGERS IV SOLN: INTRAVENOUS | Status: DC

## 2021-01-02 MED ORDER — ALBUTEROL SULFATE (2.5 MG/3ML) 0.083% IN NEBU: 2.5000 mg | INHALATION_SOLUTION | RESPIRATORY_TRACT | Status: DC | PRN

## 2021-01-02 MED ORDER — HYDROMORPHONE HCL 1 MG/ML IJ SOLN
0.2500 mg | INTRAMUSCULAR | Status: DC | PRN
  Administered 2021-01-02: 0.25 mg via INTRAVENOUS
  Administered 2021-01-02: 0.5 mg via INTRAVENOUS
  Administered 2021-01-02: 0.25 mg via INTRAVENOUS

## 2021-01-02 MED ORDER — GABAPENTIN 300 MG PO CAPS
300.0000 mg | ORAL_CAPSULE | Freq: Two times a day (BID) | ORAL | Status: DC
  Administered 2021-01-02 – 2021-01-09 (×14): 300 mg via ORAL
  Filled 2021-01-02 (×14): qty 1

## 2021-01-02 MED ORDER — PROPOFOL 10 MG/ML IV BOLUS
INTRAVENOUS | Status: DC | PRN
Start: 2021-01-02 — End: 2021-01-02
  Administered 2021-01-02: 150 mg via INTRAVENOUS

## 2021-01-02 MED ORDER — LIDOCAINE 2% (20 MG/ML) 5 ML SYRINGE
INTRAMUSCULAR | Status: DC | PRN
Start: 2021-01-02 — End: 2021-01-02
  Administered 2021-01-02: 60 mg via INTRAVENOUS

## 2021-01-02 MED ORDER — ONDANSETRON HCL 4 MG/2ML IJ SOLN
INTRAMUSCULAR | Status: DC | PRN
  Administered 2021-01-02: 4 mg via INTRAVENOUS

## 2021-01-02 MED ORDER — VANCOMYCIN HCL 1500 MG/300ML IV SOLN
1500.0000 mg | Freq: Once | INTRAVENOUS | Status: AC
  Administered 2021-01-02: 1500 mg via INTRAVENOUS
  Filled 2021-01-02: qty 300

## 2021-01-02 MED ORDER — SODIUM CHLORIDE 0.9 % IR SOLN
Status: DC | PRN
  Administered 2021-01-02: 1000 mL

## 2021-01-02 MED ORDER — METOCLOPRAMIDE HCL 5 MG PO TABS: 5.0000 mg | ORAL_TABLET | Freq: Three times a day (TID) | ORAL | Status: DC | PRN

## 2021-01-02 MED ORDER — AMLODIPINE BESYLATE 10 MG PO TABS
10.0000 mg | ORAL_TABLET | Freq: Every day | ORAL | Status: DC
  Administered 2021-01-03 – 2021-01-09 (×5): 10 mg via ORAL
  Filled 2021-01-02 (×8): qty 1

## 2021-01-02 MED ORDER — VANCOMYCIN HCL 1000 MG IV SOLR
INTRAVENOUS | Status: AC
  Filled 2021-01-02: qty 1000

## 2021-01-02 MED ORDER — METOCLOPRAMIDE HCL 5 MG/ML IJ SOLN: 5.0000 mg | Freq: Three times a day (TID) | INTRAMUSCULAR | Status: DC | PRN

## 2021-01-02 MED ORDER — FENTANYL CITRATE (PF) 250 MCG/5ML IJ SOLN
INTRAMUSCULAR | Status: AC
  Filled 2021-01-02: qty 5

## 2021-01-02 MED ORDER — OXYCODONE HCL 5 MG PO TABS
10.0000 mg | ORAL_TABLET | ORAL | Status: DC | PRN
  Administered 2021-01-03 – 2021-01-05 (×11): 10 mg via ORAL
  Administered 2021-01-06 – 2021-01-08 (×6): 15 mg via ORAL
  Administered 2021-01-08: 10 mg via ORAL
  Filled 2021-01-02: qty 2
  Filled 2021-01-02 (×2): qty 3
  Filled 2021-01-02: qty 2
  Filled 2021-01-02 (×2): qty 3
  Filled 2021-01-02: qty 2
  Filled 2021-01-02: qty 3
  Filled 2021-01-02 (×2): qty 2
  Filled 2021-01-02: qty 3
  Filled 2021-01-02 (×3): qty 2
  Filled 2021-01-02: qty 3
  Filled 2021-01-02: qty 2

## 2021-01-02 MED ORDER — FENTANYL CITRATE (PF) 100 MCG/2ML IJ SOLN
INTRAMUSCULAR | Status: DC | PRN
  Administered 2021-01-02: 25 ug via INTRAVENOUS
  Administered 2021-01-02: 100 ug via INTRAVENOUS
  Administered 2021-01-02 (×2): 50 ug via INTRAVENOUS
  Administered 2021-01-02: 25 ug via INTRAVENOUS

## 2021-01-02 SURGICAL SUPPLY — 52 items
BAG COUNTER SPONGE SURGICOUNT (BAG) ×2 IMPLANT
BLADE SAW SGTL 18X1.27X75 (BLADE) ×2 IMPLANT
BLADE SURG 10 STRL SS (BLADE) ×2 IMPLANT
CANISTER PREVENA PLUS 150 (CANNISTER) ×2 IMPLANT
CANISTER WOUNDNEG PRESSURE 500 (CANNISTER) ×2 IMPLANT
COVER SURGICAL LIGHT HANDLE (MISCELLANEOUS) ×2 IMPLANT
DRAPE C-ARM 42X72 X-RAY (DRAPES) ×2 IMPLANT
DRAPE STERI IOBAN 125X83 (DRAPES) ×2 IMPLANT
DRAPE U-SHAPE 47X51 STRL (DRAPES) ×6 IMPLANT
DRESSING PEEL AND PLC PRVNA 13 (GAUZE/BANDAGES/DRESSINGS) ×1 IMPLANT
DRSG PEEL AND PLACE PREVENA 13 (GAUZE/BANDAGES/DRESSINGS) ×2
DURAPREP 26ML APPLICATOR (WOUND CARE) ×2 IMPLANT
ELECT BLADE 4.0 EZ CLEAN MEGAD (MISCELLANEOUS) ×2
ELECT REM PT RETURN 9FT ADLT (ELECTROSURGICAL) ×2
ELECTRODE BLDE 4.0 EZ CLN MEGD (MISCELLANEOUS) ×1 IMPLANT
ELECTRODE REM PT RTRN 9FT ADLT (ELECTROSURGICAL) ×1 IMPLANT
FACESHIELD WRAPAROUND (MASK) ×4 IMPLANT
GLOVE SRG 8 PF TXTR STRL LF DI (GLOVE) ×2 IMPLANT
GLOVE SURG LTX SZ8 (GLOVE) ×2 IMPLANT
GLOVE SURG ORTHO LTX SZ7.5 (GLOVE) ×4 IMPLANT
GLOVE SURG UNDER POLY LF SZ8 (GLOVE) ×4
GOWN STRL REUS W/ TWL LRG LVL3 (GOWN DISPOSABLE) ×2 IMPLANT
GOWN STRL REUS W/ TWL XL LVL3 (GOWN DISPOSABLE) ×2 IMPLANT
GOWN STRL REUS W/TWL LRG LVL3 (GOWN DISPOSABLE) ×4
GOWN STRL REUS W/TWL XL LVL3 (GOWN DISPOSABLE) ×4
HANDPIECE INTERPULSE COAX TIP (DISPOSABLE) ×2
HEAD FEM STD 32X+1 STRL (Hips) ×2 IMPLANT
JET LAVAGE IRRISEPT WOUND (IRRIGATION / IRRIGATOR) ×2
KIT BASIN OR (CUSTOM PROCEDURE TRAY) ×2 IMPLANT
KIT TURNOVER KIT B (KITS) ×2 IMPLANT
LAVAGE JET IRRISEPT WOUND (IRRIGATION / IRRIGATOR) ×1 IMPLANT
LINER ACETABULAR 32X50 (Liner) ×2 IMPLANT
MANIFOLD NEPTUNE II (INSTRUMENTS) ×4 IMPLANT
NS IRRIG 1000ML POUR BTL (IV SOLUTION) ×2 IMPLANT
PACK TOTAL JOINT (CUSTOM PROCEDURE TRAY) ×2 IMPLANT
PAD ARMBOARD 7.5X6 YLW CONV (MISCELLANEOUS) ×4 IMPLANT
SET HNDPC FAN SPRY TIP SCT (DISPOSABLE) ×1 IMPLANT
SPONGE T-LAP 18X18 ~~LOC~~+RFID (SPONGE) ×8 IMPLANT
SUT ETHIBOND NAB CT1 #1 30IN (SUTURE) ×2 IMPLANT
SUT ETHILON 2 0 PSLX (SUTURE) ×8 IMPLANT
SUT PDS AB 1 TP1 54 (SUTURE) ×4 IMPLANT
SUT VIC AB 0 CT1 27 (SUTURE) ×4
SUT VIC AB 0 CT1 27XBRD ANBCTR (SUTURE) ×2 IMPLANT
SUT VIC AB 1 CT1 27 (SUTURE) ×4
SUT VIC AB 1 CT1 27XBRD ANBCTR (SUTURE) ×2 IMPLANT
SUT VIC AB 2-0 CT1 27 (SUTURE) ×4
SUT VIC AB 2-0 CT1 TAPERPNT 27 (SUTURE) ×2 IMPLANT
SWAB COLLECTION DEVICE MRSA (MISCELLANEOUS) ×4 IMPLANT
SWAB CULTURE ESWAB REG 1ML (MISCELLANEOUS) ×4 IMPLANT
TOWEL GREEN STERILE (TOWEL DISPOSABLE) ×2 IMPLANT
TOWEL GREEN STERILE FF (TOWEL DISPOSABLE) ×2 IMPLANT
WATER STERILE IRR 1000ML POUR (IV SOLUTION) ×2 IMPLANT

## 2021-01-02 NOTE — Op Note (Signed)
Sharon [name], Sharon [name] MEDICAL RECORD NO: [id number] ACCOUNT NO: [id number] DATE OF BIRTH: 26-Mar-1956 FACILITY: [facility] LOCATION: [id number] PHYSICIAN: Lind Guest. Ninfa Linden, MD  Operative Report   DATE OF PROCEDURE: 01/02/2021  PREOPERATIVE DIAGNOSIS:  Left prosthetic hip joint infection.  POSTOPERATIVE DIAGNOSIS:  Left prosthetic hip joint infection.  PROCEDURE: 1.  Irrigation and debridement of left hip including sharp excisional debridement of skin, fascia and muscle. 2.  Left hip polyethylene liner exchange and hip ball exchange.  SURGEON:  Lind Guest. Ninfa Linden, MD  ASSISTANT:  Ky Barban, RNFA  ANTIBIOTICS:  Vancomycin IV after cultures obtained.  BLOOD LOSS:  200 mL.  COMPLICATIONS:  None.  FINDINGS:  Gross purulence both superficial and deep around the prosthesis (Gram stain and cultures pending).  COMPLICATIONS:  None.  INDICATIONS:  The patient is a 65 year old female well known to me.  In 03/2020, we performed a direct anterior left total hip arthroplasty.  We were cautious about doing the surgery because her BMI was in the mid 50s.  She had lost a significant amount  of weight and had petitioned her insurance company to get this surgery approved.  We felt that she was on a weight loss journey and that we were okay with proceeding with surgery due to the debilitating arthritis involving her left hip and the  detrimental effect this is having on her quality of life, her mobility, activities of daily living.  The hip replacement did well.  Postoperatively, due to adipose tissue in this area, she did have breakdown of her incision.  She did not require  reoperation and had wound care treatment and eventually healed this incision.  We saw her multiple weeks and multiple months after surgery and the incision healed great and she is ambulating without any type of assistive device and was pain free.  She  also continued to lose weight on her weight loss journey and since her  initial hip replacement, has lost 30 pounds and her BMI is now below 40.  Over the last week, she developed some firmness around her incision and then a small wound opened and she  developed some drainage.  I saw her in the office yesterday.  She did not do any type of antibiotics, fortunately and we recommended an irrigation and debridement of the hip with a poly liner exchange and hip ball exchange.  I did have a long and  thorough discussion about the possibility of this being a deep and chronic infection and there is a chance that she would not clear this without removing all of the hardware in her hip.  She does understand our rationale behind proceeding with surgery  and does wish to proceed.  DESCRIPTION OF PROCEDURE:  After informed consent was obtained, appropriate left hip was marked.  She was brought to the operating room.  General anesthesia was obtained while she was on a stretcher.  Traction boots were placed on both her feet.  Next,  she was placed supine on the Hana fracture table, the perineal post in place and both legs in line skeletal traction device and no traction applied.  Her left operative hip was prepped and draped in DuraPrep and sterile drapes.  A timeout was called, and  she was identified correct patient, correct left hip.  I then used a #10 blade to go through her previous incision and sharply excised a sinus tract that was around the skin, so I actually sharply debrided with a scalpel, skin and fascia in this area.  We did encounter some purulence and a wound that tracked down deep into the hip joint itself.  We opened the hip wider and used a #10 blade to excise necrotic fascia and in this area proximal to the tensor fascia itself.  We used a rongeur as well in our  debridement.  We then opened up the tensor fascia and went deeper and we got down to the hip joint.  I felt there was purulent material around the hip prosthesis itself.  I did take several cultures from  superficial down to deep and around the joint  itself prior to giving her any antibiotics.  We then gave her vancomycin dose after that.  We then irrigated 3 liters normal saline solution through the superficial and deep tissue.  After we did this, we dislocated the hip.  I removed the original hip  ball and the polyethylene liner.  We then additionally irrigated another 3 liters of normal saline solution through the hip joint using pulsatile lavage.  Using a 10 blade, again, I sharply excised necrotic joint capsule and tissue from around the  acetabular component and from around the femoral component.  This included sharp excisional debridement again of fascia and muscle in this area.  After we removed the hip liner and hip ball and thoroughly irrigated the tissues, we placed a new 32+0  neutral polyethylene liner for the size 50 acetabular component and a new 32+1 metal hip ball.  We reduced this back into the acetabulum and it was stable on exam and we assessed fluoroscopically to make sure it was stable.  We then used IrriSept  solution, which was 450 mL of solution to irrigate out the hip joint once again.  Once we dried it thoroughly and hemostasis obtained with electrocautery, we placed vancomycin powder deep within the arthrotomy.  We then closed the deep tissue with #1 PDS  suture followed by 0 Vicryl to close deep tissue and 2-0 Vicryl to close the subcutaneous tissue.  The skin was closed with interrupted nylon suture.  A Prevena wound VAC was then applied over the incision.  The patient was taken off the Hana table,  awakened, extubated, and taken to recovery room in stable condition with all final counts being correct and no complications noted.  Postoperatively, she will be admitted as an inpatient and we will consult the infectious disease service for antibiotic  management and duration and likely have a PICC line placed as well.   SHW D: 01/02/2021 7:07:56 pm T: 01/02/2021 10:48:00 pm   JOB: [id number]

## 2021-01-02 NOTE — Anesthesia Postprocedure Evaluation (Signed)
Anesthesia Post Note  Patient: [name]  Procedure(s) Performed: IRRIGATION AND DEBRIDEMENT LEFT HIP WITH HIP BALL AND POLY ETHYLENE LINER EXCHANGE. (Left: Hip) APPLICATION OF WOUND VAC (Left: Hip)     Patient location during evaluation: PACU Anesthesia Type: General Level of consciousness: awake and alert Pain management: pain level controlled Vital Signs Assessment: post-procedure vital signs reviewed and stable Respiratory status: spontaneous breathing, nonlabored ventilation, respiratory function stable and patient connected to nasal cannula oxygen Cardiovascular status: blood pressure returned to baseline and stable Postop Assessment: no apparent nausea or vomiting Anesthetic complications: no   No notable events documented.  Last Vitals:  Vitals:   01/02/21 2000 01/02/21 2020  BP: (!) 145/61 (!) 142/50  Pulse: 75 76  Resp: 10 18  Temp: 37.3 C 37.1 C  SpO2: 96% 93%    Last Pain:  Vitals:   01/02/21 2020  TempSrc: Oral  PainSc: 2                  Effie Berkshire

## 2021-01-02 NOTE — H&P (Signed)
Sharon [name] is an 65 y.o. female.   Chief Complaint:   Left hip wound drainage HPI: The patient is a 65 year old female well-known to me.  She underwent a left total hip arthroplasty in November of last year.  She is someone who at the time had a BMI in the mid 40s.  We had to get her insurance company to approve her surgery due to her BMI being over 40.  She has also significant of weight and had debilitating left hip pain that was definitely affecting her mobility, her quality of life and actives daily living.  We felt that it was appropriate to proceed with a left total hip arthroplasty because of her efforts to continue to lose weight.  Her BMI is now down to 39.  Her original hip replacement had no issues at first.  She did have some slight breakdown of adipose tissue postoperatively that was just treated with local wound care and wet-to-dry dressings and eventually healed without any issues.  She had done well until this past weekend she felt some fullness in that hip and developed a draining wound.  I saw her in the office yesterday and noted that she had a draining wound at the superior aspect of her incision which was concerning for a deeper infection.  I have recommended an irrigation and debridement of the superficial and deep tissues as well as opening the hip joint itself and washing out out with changing out the polyethylene liner and hip ball in an attempt to hopefully salvage the joint.  She understands that with this being a potential for chronic infection, she may have to have the components removed.  For now, we will treat this is a more acute infection and she will need admission after surgery for PICC line placement and long-term IV antibiotics.  She currently has not been on any antibiotics prior to today's surgery.  Past Medical History:  Diagnosis Date   Anginal pain ([place])    in February [id number]   Biliary colic    Cancer ([place])    melanoma: lt. inner thight   Carpal tunnel  syndrome    bilateral   Chronic allergic rhinitis    Chronic urticaria    Complication of anesthesia    confusion for a couple of days after waking after having multiple knee surgery's, does well with spinal   Degenerative disk disease    Diverticulosis of colon    Dyspnea    Dysrhythmia    February 2022   Fatty liver    Fibrocystic breast disease 1996   GERD (gastroesophageal reflux disease)    History of colon polyps    HYPERPLASTIC 09-14-2015   History of kidney stones    passed on own   History of melanoma excision    left thigh ,inside 2009--- per pt also has had severeal excision of office for almost melanoma   Hypertension    Migraine    since age 16 not as frequent now   Moderate persistent asthma, uncomplicated    followed by dr gallagher (cone allergy and asthma)   OA (osteoarthritis)    all joints   SUI (stress urinary incontinence, female)     Past Surgical History:  Procedure Laterality Date   APPENDECTOMY  1973   BREAST EXCISIONAL BIOPSY Right 1994;  10-01-2009   2011 per path mild ductal hyperplasia/ fibrocystic breast   BREAST SURGERY     CHOLECYSTECTOMY N/A 01/30/2017   Procedure: LAPAROSCOPIC CHOLECYSTECTOMY;  Surgeon: Kieth Brightly,  Arta Bruce, MD;  Location: [facility];  Service: General;  Laterality: N/A;   COLONOSCOPY N/A 09/14/2015   Procedure: COLONOSCOPY;  Surgeon: Daneil Dolin, MD;  [facility];  Service: Endoscopy;  Laterality: N/A;  930 - moved to 9:15 - office to notify   ESOPHAGOGASTRODUODENOSCOPY (EGD) WITH PROPOFOL N/A 11/29/2016   Procedure: ESOPHAGOGASTRODUODENOSCOPY (EGD) WITH PROPOFOL;  Surgeon: Doran Stabler, MD;  Location: [facility];  Service: Gastroenterology;  Laterality: N/A;   JOINT REPLACEMENT     knee replacement and left hip replacement   KNEE ARTHROSCOPY W/ MENISCECTOMY Bilateral left 12-19-2006;  right 01-26-2010   LAPAROSCOPIC GASTRIC SLEEVE RESECTION N/A 08/16/2019   Procedure:  LAPAROSCOPIC GASTRIC SLEEVE RESECTION, Upper Endo, ERAS Pathway;  Surgeon: Kinsinger, Arta Bruce, MD;  Location: [facility];  Service: General;  Laterality: N/A;   [place] Left 04/14/2020   Procedure: LEFT TOTAL HIP ARTHROPLASTY ANTERIOR APPROACH;  Surgeon: Mcarthur Rossetti, MD;  Location: [facility];  Service: Orthopedics;  Laterality: Left;   TOTAL KNEE ARTHROPLASTY Left 03/10/2007   TOTAL KNEE ARTHROPLASTY  06/17/2011   Procedure: TOTAL KNEE ARTHROPLASTY;  Surgeon: Arther Abbott, MD;  Location: [facility];  Service: Orthopedics;  Laterality: Right;  [place]   TOTAL KNEE REVISION Left 07/08/2010  (spinal anesthesia)   TUBAL LIGATION Bilateral YRS AGO   VAGINAL HYSTERECTOMY  1988    Family History  Problem Relation Age of Onset   Hypertension Father    Lung cancer Father 23       lung cancer   Lung disease Other        family history    Allergic rhinitis Daughter    Food Allergy Grandchild    Anesthesia problems Neg Hx    Hypotension Neg Hx    Malignant hyperthermia Neg Hx    Pseudochol deficiency Neg Hx    Colon cancer Neg Hx    Angioedema Neg Hx    Asthma Neg Hx    Eczema Neg Hx    Immunodeficiency Neg Hx    Urticaria Neg Hx    Social History:  reports that she quit smoking about 14 years ago. Her smoking use included cigarettes. She started smoking about 48 years ago. She has a 25.00 pack-year smoking history. She has never used smokeless tobacco. She reports current alcohol use. She reports that she does not use drugs.  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 90 tablet 0   Calcium-Vitamin D-Vitamin K (VIACTIV CALCIUM PLUS D) 650-12.5-40 MG-MCG-MCG CHEW Chew 1 tablet by mouth in the morning, at noon, and at bedtime.     docusate sodium (COLACE) 100 MG capsule Take 100 mg by mouth 2 (two) times daily as needed for mild constipation.     fexofenadine (ALLEGRA) 180 MG tablet Take 180 mg by mouth in the morning.     fluticasone (FLONASE) 50 MCG/ACT nasal spray Place 2 sprays into both nostrils at bedtime.     gabapentin (NEURONTIN) 300 MG capsule  Take 1 capsule (300 mg total) by mouth at bedtime. Can increase to twice daily after 5 days if tolerating well. (Patient taking differently: Take 300 mg by mouth 2 (two) times daily.) 60 capsule 1   HYDROcodone-acetaminophen (NORCO) 10-325 MG tablet Take 1 tablet by mouth 3 (three) times daily as needed (pain.).     Lidocaine HCl-Benzyl Alcohol (SALONPAS LIDOCAINE PLUS EX) Place 1 patch onto the skin every 8 (eight) hours as needed (pain.).     methocarbamol (ROBAXIN) 500 MG tablet TAKE 1 TABLET(500 MG) BY MOUTH EVERY 6 HOURS AS NEEDED FOR MUSCLE SPASMS (Patient taking differently: Take 500 mg by mouth every 6 (six) hours as needed for muscle spasms.) 40 tablet 1   metoprolol tartrate (LOPRESSOR) 25 MG tablet Take 1 tablet (25 mg total) by mouth 2 (two) times daily. 60 tablet 3   Multiple Vitamins-Minerals (BARIATRIC MULTIVITAMINS/IRON PO) Take 1 tablet by mouth in the morning.     nabumetone (RELAFEN) 750 MG tablet TAKE 1 TABLET(750 MG) BY MOUTH TWICE DAILY AS NEEDED FOR PAIN (Patient taking differently: Take 750 mg by mouth 2 (two) times daily.) 60 tablet 2   pantoprazole (PROTONIX) 40 MG tablet TAKE 1 TABLET BY MOUTH EVERY DAY FOR REFLUX (Patient taking differently: Take 40 mg by mouth every evening.) 90 tablet 0   albuterol (PROVENTIL HFA;VENTOLIN HFA) 108 (90 Base) MCG/ACT inhaler Inhale 2 puffs into the lungs every 4 (four) hours as needed for wheezing or  shortness of breath. 1 Inhaler 2   albuterol (PROVENTIL) (2.5 MG/3ML) 0.083% nebulizer solution USE ONE VIAL IN NEBULIZER EVERY 4 HOURS AS NEEDED FOR WHEEZING (Patient taking differently: Take 2.5 mg by nebulization every 4 (four) hours as needed for wheezing.) 50 vial 2   ondansetron (ZOFRAN-ODT) 8 MG disintegrating tablet DISSOLVE 1 TABLET IN MOUTH EVERY 8 HOURS AS NEEDED FOR NAUSEA OR VOMITING (Patient taking differently: Take 8 mg by mouth every 6 (six) hours as needed for vomiting or nausea.) 30 tablet 1    Results for orders placed or performed during the hospital encounter of 01/02/21 (from the past 48 hour(s))  SARS Coronavirus 2 by RT PCR (hospital order, performed in [facility] hospital lab) Nasopharyngeal Nasopharyngeal Swab     Status: None   Collection Time: 01/02/21  1:46 PM   Specimen: Nasopharyngeal Swab  Result Value Ref Range   SARS Coronavirus 2 NEGATIVE NEGATIVE    Comment: (NOTE) SARS-CoV-2 target nucleic acids are NOT DETECTED.  The SARS-CoV-2 RNA is generally detectable in upper and lower respiratory specimens during the acute phase of infection. The lowest concentration of SARS-CoV-2 viral copies this assay can detect is 250 copies / mL. A negative result does not preclude SARS-CoV-2 infection and should not be used as the sole basis for treatment or other patient management decisions.  A negative result may occur with improper specimen collection / handling, submission of specimen other than nasopharyngeal swab, presence of viral mutation(s) within the areas targeted by this assay, and inadequate number of viral copies (<250 copies / mL). A negative result must be combined with clinical observations, patient history, and epidemiological information.  Fact Sheet for Patients:   StrictlyIdeas.no  Fact Sheet for Healthcare Providers: BankingDealers.co.za  This test is not yet approved or  cleared by the [place]  FDA and has been authorized for detection and/or diagnosis of SARS-CoV-2 by FDA under an Emergency Use Authorization (EUA).  This EUA will remain in effect (meaning this test can be used) for the duration  of the COVID-19 declaration under Section 564(b)(1) of the Act, 21 U.S.C. section 360bbb-3(b)(1), unless the authorization is terminated or revoked sooner.  Performed at [facility], [place] [address], [place], [place] [address]   CBC     Status: Abnormal   Collection Time: 01/02/21  2:00 PM  Result Value Ref Range   WBC 10.0 4.0 - 10.5 K/uL   RBC 4.31 3.87 - 5.11 MIL/uL   Hemoglobin 13.7 12.0 - 15.0 g/dL   HCT 42.3 36.0 - 46.0 %   MCV 98.1 80.0 - 100.0 fL   MCH 31.8 26.0 - 34.0 pg   MCHC 32.4 30.0 - 36.0 g/dL   RDW 13.1 11.5 - 15.5 %   Platelets 448 (H) 150 - 400 K/uL   nRBC 0.0 0.0 - 0.2 %    Comment: Performed at [facility] [address] [place], [place] [id number]  Basic metabolic panel     Status: Abnormal   Collection Time: 01/02/21  2:00 PM  Result Value Ref Range   Sodium 137 135 - 145 mmol/L   Potassium 4.4 3.5 - 5.1 mmol/L   Chloride 103 98 - 111 mmol/L   CO2 21 (L) 22 - 32 mmol/L   Glucose, Bld 88 70 - 99 mg/dL    Comment: Glucose reference range applies only to samples taken after fasting for at least 8 hours.   BUN 13 8 - 23 mg/dL   Creatinine, Ser 0.69 0.44 - 1.00 mg/dL   Calcium 9.2 8.9 - 10.3 mg/dL   GFR, Estimated >60 >60 mL/min    Comment: (NOTE) Calculated using the CKD-EPI Creatinine Equation (2021)    Anion gap 13 5 - 15    Comment: Performed at [facility] [address] [place], [place] [address]   XR HIP Kaylyn Layer W OR W/O PELVIS 1V LEFT  Result Date: 01/01/2021 An AP pelvis and lateral left hip shows a well-seated total hip arthroplasty.  There is a slight periosteal reaction at the proximal femur that was not seen on previous films.   Review of Systems  All other systems reviewed and are negative.  Blood pressure (!)  148/55, pulse 80, temperature 98.2 F (36.8 C), temperature source Oral, resp. rate 20, height 5' (1.524 m), weight 90.7 kg, SpO2 100 %. Physical Exam Vitals reviewed.  Constitutional:      Appearance: Normal appearance.  HENT:     Head: Normocephalic and atraumatic.  Eyes:     Extraocular Movements: Extraocular movements intact.     Pupils: Pupils are equal, round, and reactive to light.  Cardiovascular:     Rate and Rhythm: Normal rate.  Pulmonary:     Effort: Pulmonary effort is normal.     Breath sounds: Normal breath sounds.  Abdominal:     Palpations: Abdomen is soft.  Musculoskeletal:     Cervical back: Normal range of motion.       Legs:  Neurological:     Mental Status: She is alert and oriented to person, place, and time.  Psychiatric:        Behavior: Behavior normal.     Assessment/Plan Left prosthetic hip joint infection  Again our plan is to proceed to surgery today for irrigation debridement of the superficial and deep tissues as well as the hip joint on the left side.  We will change on the polyliner and hip ball as well as placed antibiotics deep into the arthrotomy.  She will then be admitted  for PICC line placement and long-term IV antibiotics in an attempt to salvage the joint.  She understands that she still may end up needing all the components removed.  She has fortunately not been on antibiotics and thus we will be able to obtain cultures interoperative.  The risks and benefits of surgery been explained in detail.  Mcarthur Rossetti, MD 01/02/2021, 4:06 PM

## 2021-01-02 NOTE — Brief Op Note (Signed)
01/02/2021  7:09 PM  PATIENT:  Sharon [name]  65 y.o. female  PRE-OPERATIVE DIAGNOSIS:  left hip infection  POST-OPERATIVE DIAGNOSIS:  Left Hip Infection  PROCEDURE:  Procedure(s) with comments: IRRIGATION AND DEBRIDEMENT LEFT HIP WITH HIP BALL AND POLY ETHYLENE LINER EXCHANGE. (Left) - needs RNFA APPLICATION OF WOUND VAC (Left)  SURGEON:  Surgeon(s) and Role:    * Mcarthur Rossetti, MD - Primary  ASSISTANTS: Ky Barban, RNFA   ANESTHESIA:   general  EBL:  200 mL   COUNTS:  YES  TOURNIQUET:  * No tourniquets in log *  DICTATION: .Other Dictation: Dictation Number [id number]  PLAN OF CARE: Admit to inpatient   PATIENT DISPOSITION:  PACU - hemodynamically stable.   Delay start of Pharmacological VTE agent (>24hrs) due to surgical blood loss or risk of bleeding: no

## 2021-01-02 NOTE — Progress Notes (Signed)
Pharmacy Antibiotic Note  Sharon [name] is a 65 y.o. female admitted on 01/02/2021 with  prosthetic joint infection .  Pharmacy has been consulted for vancomycin dosing.  Plan: Vancomycin '1500mg'$  given post-procedure @ 1745. Will defer load and begin 1000 every 12 hours.  Goal AUC 400-550. AUC estimated @ 510 (using 0.'8mg'$ /dL for Scr and 0.5L/kg for Vd).  Height: 5' (152.4 cm) Weight: 90.7 kg (200 lb) IBW/kg (Calculated) : 45.5  Temp (24hrs), Avg:98.5 F (36.9 C), Min:97.9 F (36.6 C), Max:99.1 F (37.3 C)  Recent Labs  Lab 01/02/21 1400  WBC 10.0  CREATININE 0.69    Estimated Creatinine Clearance: 69.5 mL/min (by C-G formula based on SCr of 0.69 mg/dL).    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  Mignon Pine 01/02/2021 8:34 PM

## 2021-01-02 NOTE — Anesthesia Procedure Notes (Signed)
Procedure Name: Intubation Date/Time: 01/02/2021 5:13 PM Performed by: Georgia Duff, CRNA Pre-anesthesia Checklist: Patient identified, Emergency Drugs available, Suction available and Patient being monitored Patient Re-evaluated:Patient Re-evaluated prior to induction Oxygen Delivery Method: Circle System Utilized Preoxygenation: Pre-oxygenation with 100% oxygen Induction Type: IV induction Ventilation: Mask ventilation without difficulty Laryngoscope Size: Miller and 2 Tube type: Oral Tube size: 7.0 mm Number of attempts: 1 Airway Equipment and Method: Stylet and Oral airway Placement Confirmation: ETT inserted through vocal cords under direct vision, positive ETCO2 and breath sounds checked- equal and bilateral Secured at: 22 cm Tube secured with: Tape Dental Injury: Teeth and Oropharynx as per pre-operative assessment

## 2021-01-02 NOTE — Transfer of Care (Signed)
Immediate Anesthesia Transfer of Care Note  Patient: Sharon [name]  Procedure(s) Performed: IRRIGATION AND DEBRIDEMENT LEFT HIP WITH HIP BALL AND POLY ETHYLENE LINER EXCHANGE. (Left: Hip) APPLICATION OF WOUND VAC (Left: Hip)  Patient Location: PACU  Anesthesia Type:General  Level of Consciousness: awake, alert  and oriented  Airway & Oxygen Therapy: Patient Spontanous Breathing and Patient connected to nasal cannula oxygen  Post-op Assessment: Report given to RN  Post vital signs: Reviewed and stable  Last Vitals:  Vitals Value Taken Time  BP    Temp    Pulse 77 01/02/21 1911  Resp 18 01/02/21 1911  SpO2 100 % 01/02/21 1911  Vitals shown include unvalidated device data.  Last Pain:  Vitals:   01/02/21 1433  TempSrc:   PainSc: 5       Patients Stated Pain Goal: 4 ([phone] [id number])  Complications: No notable events documented.

## 2021-01-03 ENCOUNTER — Encounter (HOSPITAL_COMMUNITY): Payer: Self-pay | Admitting: Orthopaedic Surgery

## 2021-01-03 LAB — CBC
HCT: 33.8 % — ABNORMAL LOW (ref 36.0–46.0)
Hemoglobin: 11 g/dL — ABNORMAL LOW (ref 12.0–15.0)
MCH: 31.5 pg (ref 26.0–34.0)
MCHC: 32.5 g/dL (ref 30.0–36.0)
MCV: 96.8 fL (ref 80.0–100.0)
Platelets: 361 10*3/uL (ref 150–400)
RBC: 3.49 MIL/uL — ABNORMAL LOW (ref 3.87–5.11)
RDW: 13.2 % (ref 11.5–15.5)
WBC: 11.4 10*3/uL — ABNORMAL HIGH (ref 4.0–10.5)
nRBC: 0 % (ref 0.0–0.2)

## 2021-01-03 LAB — BASIC METABOLIC PANEL
Anion gap: 6 (ref 5–15)
BUN: 9 mg/dL (ref 8–23)
CO2: 26 mmol/L (ref 22–32)
Calcium: 8.8 mg/dL — ABNORMAL LOW (ref 8.9–10.3)
Chloride: 104 mmol/L (ref 98–111)
Creatinine, Ser: 0.62 mg/dL (ref 0.44–1.00)
GFR, Estimated: >60 mL/min (ref >60)
Glucose, Bld: 162 mg/dL — ABNORMAL HIGH (ref 70–99)
Potassium: 4.6 mmol/L (ref 3.5–5.1)
Sodium: 136 mmol/L (ref 135–145)

## 2021-01-03 LAB — C-REACTIVE PROTEIN: CRP: 9.4 mg/dL — ABNORMAL HIGH (ref <1.0)

## 2021-01-03 LAB — SEDIMENTATION RATE: Sed Rate: 85 mm/hr — ABNORMAL HIGH (ref 0–22)

## 2021-01-03 NOTE — Telephone Encounter (Signed)
Called pt and she states that she is currently in the hospital and will not be able to come to her scheduled appointment 01-08-21.  I have rescheduled her appointment. She will call back if this needs to be changed. I also added her to the wait list.

## 2021-01-03 NOTE — Progress Notes (Signed)
Subjective: 1 Day Post-Op Procedure(s) (LRB): IRRIGATION AND DEBRIDEMENT LEFT HIP WITH HIP BALL AND POLY ETHYLENE LINER EXCHANGE. (Left) APPLICATION OF WOUND VAC (Left) Patient reports pain as moderate.    Objective: Vital signs in last 24 hours: Temp:  [97.9 F (36.6 C)-99.1 F (37.3 C)] 99 F (37.2 C) (08/10 0600) Pulse Rate:  [57-82] 57 (08/10 0600) Resp:  [10-20] 18 (08/09 2020) BP: (106-148)/(50-66) 106/51 (08/10 0600) SpO2:  [93 %-100 %] 97 % (08/10 0600) Weight:  [90.7 kg] 90.7 kg (08/09 1401)  Intake/Output from previous day: 08/09 0701 - 08/10 0700 In: 2198.4 [I.V.:1698.4; IV Piggyback:500] Out: 700 [Urine:500; Blood:200] Intake/Output this shift: No intake/output data recorded.  Recent Labs    01/02/21 1400 01/03/21 0220  HGB 13.7 11.0*   Recent Labs    01/02/21 1400 01/03/21 0220  WBC 10.0 11.4*  RBC 4.31 3.49*  HCT 42.3 33.8*  PLT 448* 361   Recent Labs    01/02/21 1400 01/03/21 0220  NA 137 136  K 4.4 4.6  CL 103 104  CO2 21* 26  BUN 13 9  CREATININE 0.69 0.62  GLUCOSE 88 162*  CALCIUM 9.2 8.8*   No results for input(s): LABPT, INR in the last 72 hours.  Sensation intact distally Intact pulses distally Dorsiflexion/Plantar flexion intact Incision: dressing C/D/I   Assessment/Plan: 1 Day Post-Op Procedure(s) (LRB): IRRIGATION AND DEBRIDEMENT LEFT HIP WITH HIP BALL AND POLY ETHYLENE LINER EXCHANGE. (Left) APPLICATION OF WOUND VAC (Left) Up with therapy Will follow cultures Will need PICC line during this hospitalization as well as ID consult     Mcarthur Rossetti 01/03/2021, 7:14 AM

## 2021-01-03 NOTE — TOC Initial Note (Addendum)
Transition of Care [facility]) - Initial/Assessment Note    Patient Details  Name: Sharon [name] MRN: [id number] Date of Birth: 05/17/1956  Transition [facility]) CM/SW Contact:    Sharin Mons, RN Phone Number: 01/03/2021, 10:07 AM  Clinical Narrative:        Pt presents with L hip wound drainage. Hx of L THR 03/2020.                -S/P  Irrigation and debridement of left hip, 8/9  NCM spoke with pt regarding d/c planning. 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 From home alone. States daughter will assist with care once d/c. PTA independent with ADL's. Already has DME: BSC, RW, cane, shower chair.  Per ortho MD: following cultures.Marland KitchenMarland KitchenWill need PICC line during this hospitalization as well as ID consult   NCM make pt aware of potential home health service need. Pt agreeable to home health services. Choice offered. Pt without preference. Referral made with [facility] for IV ABX therapy and referral made with [facility] for RN,PT home health services pending MD's orders. Both agencies have accepted to provides services if needed.  TOC team following and will continue to assist with TOC needs....  8/11 Pt made made NCM she wanted to use [facility] for home health services since she has used them before and was pleased. [facility] made aware. Referral made with [facility] and accepted.   Expected Discharge Plan: [place] Barriers to Discharge: Continued Medical Work up   Patient Goals and CMS Choice     Choice offered to / list presented to : Patient  Expected Discharge Plan and Services Expected Discharge Plan: [place]   Discharge Planning Services: CM Consult   Living arrangements for the past 2 months: Single Family Home                 DME Arranged: Other see comment (IV ABX therapy)   Date DME Agency Contacted: 01/03/21 Time DME Agency Contacted: 1003 Representative spoke with at DME Agency:  [place]: RN, PT [place] Agency: [place] ([place]) Date [place]: 01/03/21 Time [place]: 1004 Representative spoke with at [place]: [place] Arrangements/Services Living arrangements for the past 2 months: [facility]   Patient language and need for interpreter reviewed:: Yes Do you feel safe going back to the place where you live?: Yes      Need for Family Participation in Patient Care: Yes (Comment) Care giver support system in place?: Yes (comment)   Criminal Activity/Legal Involvement Pertinent to Current Situation/Hospitalization: No - Comment as needed  Activities of Daily Living Home Assistive Devices/Equipment: Eyeglasses, Environmental consultant (specify type), Cane (specify quad or straight), Shower chair with back, Bedside commode/3-in-1, Blood pressure cuff ADL Screening (condition at time of admission) Patient's cognitive ability adequate to safely complete daily activities?: Yes Is the patient deaf or have difficulty hearing?: Yes Does the patient have difficulty seeing, even when wearing glasses/contacts?: No Does the patient have difficulty concentrating, remembering, or making decisions?: No Patient able to express need for assistance with ADLs?: Yes Does the patient have difficulty dressing or bathing?: No Independently performs ADLs?: Yes (appropriate for developmental age) Does the patient have difficulty walking or climbing stairs?: Yes Weakness of Legs: Left Weakness of Arms/Hands: None  Permission Sought/Granted  Emotional Assessment Appearance:: Appears stated age Attitude/Demeanor/Rapport: Engaged Affect (typically observed): Accepting Orientation: : Oriented to Self, Oriented to Place, Oriented to  Time, Oriented to Situation Alcohol / Substance Use: Not Applicable Psych Involvement: No (comment)  Admission diagnosis:  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, subsequent  encounter [T84.52XD] Patient Active Problem List   Diagnosis Date Noted   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[place]) 01/02/2021   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, subsequent encounter 01/02/2021   Elevated blood pressure reading in office with diagnosis of hypertension 09/01/2020   Palpitations 09/01/2020   Status post total replacement of left hip 04/14/2020   Unilateral primary osteoarthritis, left hip 02/23/2020   Seasonal and perennial allergic rhinitis 03/06/2017   Moderate persistent asthma, uncomplicated [id number]   Chronic nonseasonal allergic rhinitis due to fungal spores 12/02/2016   Chronic urticaria 12/02/2016   Anaphylactic shock due to adverse food reaction 12/02/2016   History of colonic polyps    Diverticulosis of colon without hemorrhage    Asthma, mild intermittent 08/30/2015   Encounter for screening colonoscopy 08/25/2015   Wheezing 08/08/2015   Gastroesophageal reflux disease 08/08/2015   Obesity 07/14/2013   Status post right knee replacement 06/17/2011 06/17/2013   Status post left knee replacement 02/28/2007 06/17/2013   DDD (degenerative disc disease), lumbosacral 10/06/2012   Hip pain 10/06/2012   Radicular leg pain 10/06/2012   Bursitis of hip, right 06/25/2012   Trochanteric bursitis of both hips 09/11/2011   Left knee pain 03/20/2011   OA (osteoarthritis) of knee 12/12/2010   Mononeuritis of leg 10/24/2010   Chronic pain 10/24/2010   OTHER POSTSURGICAL STATUS OTHER 07/24/2010   STRESS FRACTURE, TIBIA 04/11/2010   TENOSYNOVITIS OF FOOT AND ANKLE 02/15/2010   LOOSE BODY-KNEE 01/10/2010   DERANGEMENT MENISCUS 01/01/2010   SUBLUXATION-RADIAL HEAD 01/01/2010   SHOULDER PAIN 04/19/2009   IMPINGEMENT SYNDROME 04/19/2009   HERNIATED LUMBOSACRAL DISC 09/01/2007   UNSPECIFIED NEURALGIA NEURITIS AND RADICULITIS 07/09/2007   TOTAL KNEE FOLLOW-UP 06/25/2007   Osteoarthrosis, unspecified whether generalized or localized, lower leg  03/09/2007   KNEE PAIN 02/02/2007   TEAR MEDIAL MENISCUS 02/02/2007   PCP:  Erven Colla, DO Pharmacy:   [facility]  ([place]) - [place], [place] - [address] [place] [address] Phone: [phone] Fax: [phone]  [facility] [phone] - [place], [place] - [address] AT [place] [id number] [place] [address] Phone: [phone] Fax: [phone]     Social Determinants of Health (SDOH) Interventions    Readmission Risk Interventions No flowsheet data found.

## 2021-01-03 NOTE — Evaluation (Signed)
Physical Therapy Evaluation Patient Details Name: Sharon [name] MRN: [id number] DOB: 11-Dec-1955 Today's Date: 01/03/2021   History of Present Illness  65 yo female with onset of infection and drainage in her L hip after new prosthesis done Nov 2021, had revised hip with I and D, replaced ball and polyethelyene liner for acetabulum on 01/02/21.  Pt now is WBAT with direct ant approach.  PMHx:  angina, melanoma, CTS, allergies, DDD, diverticulosis, migraine, HTN, stress incontinence, asthma, OA, GERD, fatty liver, fibrocystic disease, kidney stones,  Clinical Impression  Pt was observed to walk and maneuver in her room after her hip revision on L LE.  Pt is in less pain, mainly noting tightness on her back and anterior L hip.  Her plan is to return home with family to help her and to receive HHPT for recovery of strength, balance and tolerance for moving LLE.  Follow for acute PT goals as are below.    Follow Up Recommendations Home health PT;Supervision for mobility/OOB    Equipment Recommendations  None recommended by PT    Recommendations for Other Services       Precautions / Restrictions Precautions Precautions: Fall Precaution Comments: monitor comfort with L hip due to second incision Restrictions Weight Bearing Restrictions: Yes LLE Weight Bearing: Weight bearing as tolerated      Mobility  Bed Mobility Overal bed mobility: Needs Assistance Bed Mobility: Supine to Sit     Supine to sit: Min assist     General bed mobility comments: using HOB elevated    Transfers Overall transfer level: Needs assistance Equipment used: Rolling walker (2 wheeled);1 person hand held assist Transfers: Sit to/from Stand Sit to Stand: Min assist         General transfer comment: min assist to get up to walk  Ambulation/Gait Ambulation/Gait assistance: Min guard Gait Distance (Feet): 60 Feet Assistive device: Rolling walker (2 wheeled);1 person hand held assist Gait  Pattern/deviations: Step-through pattern;Step-to pattern;Decreased stride length;Wide base of support Gait velocity: reduced Gait velocity interpretation: <1.31 ft/sec, indicative of household ambulator General Gait Details: requires a little extra time to stand upright and stretch low back and anterior L hip  Stairs            Wheelchair Mobility    Modified Rankin (Stroke Patients Only)       Balance Overall balance assessment: Needs assistance Sitting-balance support: Feet supported Sitting balance-Leahy Scale: Good     Standing balance support: Bilateral upper extremity supported;During functional activity Standing balance-Leahy Scale: Fair Standing balance comment: less than fair dynamically                             Pertinent Vitals/Pain Pain Assessment: 0-10 Pain Score: 5  Pain Location: anterior L hip with stretch to move Pain Descriptors / Indicators: Grimacing;Guarding;Operative site guarding Pain Intervention(s): Limited activity within patient's tolerance;Monitored during session;Premedicated before session;Repositioned    Home Living Family/patient expects to be discharged to:: Private residence Living Arrangements: Alone Available Help at Discharge: Family Type of Home: House Home Access: Stairs to enter Entrance Stairs-Rails: Right Entrance Stairs-Number of Steps: 3 Home Layout: One level;Laundry or work area in [place]: Shower seat;Grab bars - tub/shower;Walker - 2 wheels;Walker - 4 wheels;Cane - single point;Bedside commode;Grab bars - toilet (has good equipment supply)      Prior Function Level of Independence: Independent         Comments: using no AD most of the  time, SPC on step with rail     Hand Dominance   Dominant Hand: Right    Extremity/Trunk Assessment   Upper Extremity Assessment Upper Extremity Assessment: Overall WFL for tasks assessed    Lower Extremity Assessment Lower Extremity  Assessment: LLE deficits/detail LLE Deficits / Details: hip and knee weakness but mainly an issue to get off the bed, requires help to slide it to the side of bed LLE Coordination: decreased gross motor    Cervical / Trunk Assessment Cervical / Trunk Assessment: Kyphotic (mild)  Communication   Communication: No difficulties  Cognition Arousal/Alertness: Awake/alert Behavior During Therapy: WFL for tasks assessed/performed Overall Cognitive Status: Within Functional Limits for tasks assessed                                        General Comments General comments (skin integrity, edema, etc.): Pt was seen for mobility on walker and able to stand and walk in the room with min guard.  Pt is hoping to be discharged soon, so will plan to practice steps tomorrow.    Exercises     Assessment/Plan    PT Assessment Patient needs continued PT services  PT Problem List Decreased strength;Decreased range of motion;Decreased activity tolerance;Decreased balance;Decreased mobility;Decreased coordination;Decreased skin integrity;Pain       PT Treatment Interventions DME instruction;Gait training;Stair training;Functional mobility training;Therapeutic activities;Therapeutic exercise;Balance training;Patient/family education;Neuromuscular re-education    PT Goals (Current goals can be found in the Care Plan section)  Acute Rehab PT Goals Patient Stated Goal: to go home and have family help PT Goal Formulation: With patient/family Time For Goal Achievement: 01/17/21 Potential to Achieve Goals: Good    Frequency Min 5X/week   Barriers to discharge Inaccessible home environment;Decreased caregiver support home alone with stairs    Co-evaluation               AM-PAC PT "6 Clicks" Mobility  Outcome Measure Help needed turning from your back to your side while in a flat bed without using bedrails?: A Little Help needed moving from lying on your back to sitting on the  side of a flat bed without using bedrails?: A Little Help needed moving to and from a bed to a chair (including a wheelchair)?: A Little Help needed standing up from a chair using your arms (e.g., wheelchair or bedside chair)?: A Little Help needed to walk in hospital room?: A Little Help needed climbing 3-5 steps with a railing? : A Lot 6 Click Score: 17    End of Session Equipment Utilized During Treatment: Gait belt Activity Tolerance: Patient limited by fatigue;Treatment limited secondary to medical complications (Comment) Patient left: in chair;with call bell/phone within reach;with chair alarm set;with family/visitor present Nurse Communication: Mobility status PT Visit Diagnosis: Unsteadiness on feet (R26.81);Muscle weakness (generalized) (M62.81);Difficulty in walking, not elsewhere classified (R26.2);Pain Pain - Right/Left: Left Pain - part of body: Hip    Time: [id number] PT Time Calculation (min) (ACUTE ONLY): 32 min   Charges:   PT Evaluation $PT Eval Moderate Complexity: 1 Mod PT Treatments $Gait Training: 8-22 mins       Ramond Dial 01/03/2021, 1:04 PM  Mee Hives, PT MS Acute Rehab Dept. Number: [place] and [place]

## 2021-01-03 NOTE — Plan of Care (Addendum)
@  1650 Patient complains of "angina" pain. 6 out of 10 pain. Patient face flushed, EKG obtain (normal sinus w/PVCs) BP 144/55 Map 78, Resp 16, O2 98 RA, Pulse 83, no SOB. Patient states this has been occurring ever since she had COVID in the spring. Patient states she has a cardiologist appointment next Monday. Patient requesting cardiologist consult in house if unable to make it to the appointment. Patient reassessed 10 minutes later states she feels better and that the pain comes and goes.   '@1700'$  Call the on-call [place] and left a voicemail, also called the answering service and sent a page to on-call. Will continue to monitor the patient closely.  '@1725'$  Spoke with Durward Fortes patient stable, will talk with Ninfa Linden in morning for potential consult with cardiologist.   Patient requesting cardiologist consult.    Problem: Education: Goal: Knowledge of General Education information will improve Description: Including pain rating scale, medication(s)/side effects and non-pharmacologic comfort measures Outcome: Progressing   Problem: Activity: Goal: Risk for activity intolerance will decrease Outcome: Progressing   Problem: Pain Managment: Goal: General experience of comfort will improve Outcome: Progressing   Problem: Safety: Goal: Ability to remain free from injury will improve Outcome: Progressing   Problem: Skin Integrity: Goal: Risk for impaired skin integrity will decrease Outcome: Progressing

## 2021-01-04 ENCOUNTER — Inpatient Hospital Stay: Payer: Self-pay

## 2021-01-04 ENCOUNTER — Ambulatory Visit: Payer: PPO | Admitting: Family Medicine

## 2021-01-04 LAB — BASIC METABOLIC PANEL
Anion gap: 6 (ref 5–15)
BUN: 11 mg/dL (ref 8–23)
CO2: 27 mmol/L (ref 22–32)
Calcium: 8 mg/dL — ABNORMAL LOW (ref 8.9–10.3)
Chloride: 105 mmol/L (ref 98–111)
Creatinine, Ser: 0.66 mg/dL (ref 0.44–1.00)
GFR, Estimated: >60 mL/min (ref >60)
Glucose, Bld: 188 mg/dL — ABNORMAL HIGH (ref 70–99)
Potassium: 3 mmol/L — ABNORMAL LOW (ref 3.5–5.1)
Sodium: 138 mmol/L (ref 135–145)

## 2021-01-04 LAB — CBC
HCT: 27.4 % — ABNORMAL LOW (ref 36.0–46.0)
Hemoglobin: 8.9 g/dL — ABNORMAL LOW (ref 12.0–15.0)
MCH: 31.3 pg (ref 26.0–34.0)
MCHC: 32.5 g/dL (ref 30.0–36.0)
MCV: 96.5 fL (ref 80.0–100.0)
Platelets: 315 10*3/uL (ref 150–400)
RBC: 2.84 MIL/uL — ABNORMAL LOW (ref 3.87–5.11)
RDW: 13.5 % (ref 11.5–15.5)
WBC: 9.3 10*3/uL (ref 4.0–10.5)
nRBC: 0 % (ref 0.0–0.2)

## 2021-01-04 MED ORDER — CHLORHEXIDINE GLUCONATE CLOTH 2 % EX PADS
6.0000 | MEDICATED_PAD | Freq: Every day | CUTANEOUS | Status: DC
  Administered 2021-01-05 – 2021-01-09 (×5): 6 via TOPICAL

## 2021-01-04 MED ORDER — SODIUM CHLORIDE 0.9% FLUSH
10.0000 mL | INTRAVENOUS | Status: DC | PRN
  Administered 2021-01-09: 10 mL

## 2021-01-04 NOTE — Progress Notes (Signed)
Patient ID: Sharon [name], female   DOB: 22-Sep-1955, 65 y.o.   MRN: [id number] The patient's cultures are now growing out rare gram-negative rods.  With that being said we will order a PICC line for the administration of long-term IV antibiotics.  I did speak to the patient about this as well and she understands.

## 2021-01-04 NOTE — Progress Notes (Signed)
Peripherally Inserted Central Catheter Placement  The IV Nurse has discussed with the patient and/or persons authorized to consent for the patient, the purpose of this procedure and the potential benefits and risks involved with this procedure.  The benefits include less needle sticks, lab draws from the catheter, and the patient may be discharged home with the catheter. Risks include, but not limited to, infection, bleeding, blood clot (thrombus formation), and puncture of an artery; nerve damage and irregular heartbeat and possibility to perform a PICC exchange if needed/ordered by physician.  Alternatives to this procedure were also discussed.  Bard Power PICC patient education guide, fact sheet on infection prevention and patient information card has been provided to patient /or left at bedside.    PICC Placement Documentation  PICC Single Lumen [id number] Left Basilic 41 cm 0 cm (Active)  Indication for Insertion or Continuance of Line Home intravenous therapies (PICC only) 01/04/21 2145  Exposed Catheter (cm) 0 cm 01/04/21 2145  Site Assessment Clean;Dry;Intact 01/04/21 2145  Line Status Saline locked;Blood return noted 01/04/21 2145  Dressing Type Transparent;Securing device 01/04/21 2145  Dressing Status Clean;Dry;Intact 01/04/21 2145  Antimicrobial disc in place? Yes 01/04/21 2145  Safety Lock Not Applicable [id number]  Line Care Connections checked and tightened 01/04/21 2145  Line Adjustment (NICU/IV Team Only) No 01/04/21 2145  Dressing Intervention New dressing 01/04/21 2145  Dressing Change Due 01/11/21 01/04/21 2145       Rosalio Macadamia Chenice 01/04/2021, 10:18 PM

## 2021-01-04 NOTE — Progress Notes (Signed)
Patient ID: Sharon [name], female   DOB: [birth year], 65 y.o.   MRN: [id number] The patient is awake and alert this morning.  She reports that she is comfortable.  She reports a little bit of left hip tightness but overall is doing well.  Her gram stain and cultures yesterday were negative for organisms but they need updating for today.  There was white blood cells seen there were abundant which can be indicative of infection.  She did not have any antibiotics prior to surgery recently and has been off antibiotics since early January when her original postoperative wound healed.  That she has been off antibiotics for over 6 months.  I am still concerned about a potential for deep infection.  I do not need to discharge her until we have the final cultures so we can make a determination about the the possible need for long-term IV antibiotics.  I will hold on a PICC line today until we see the culture results updated.  She will continue to mobilize as tolerated.

## 2021-01-04 NOTE — Progress Notes (Signed)
PT Cancellation Note  Patient Details Name: Sharon [name] MRN: [id number] DOB: 12-12-1955   Cancelled Treatment:    Reason Eval/Treat Not Completed: Fatigue/lethargy limiting ability to participate;Pain limiting ability to participate.  Tired and having moments of being light headed when [place].  Pt is reporting a cardiology appt is set for in hosp but did not see that ordered.  Will have a PICC line installed when MD has the infection identified, and will retry mobility in the AM.     Ramond Dial 01/04/2021, 2:23 PM  Mee Hives, PT MS Acute Rehab Dept. Number: [place] and [place]

## 2021-01-04 NOTE — Progress Notes (Signed)
PICC insertion: During assessment for insertion, pt reported pain at PIV site in upper R arm. Edema noted. PICC placed in L upper arm.

## 2021-01-05 DIAGNOSIS — T8452XA Infection and inflammatory reaction due to internal left hip prosthesis, initial encounter: Principal | ICD-10-CM

## 2021-01-05 MED ORDER — CEFTRIAXONE IV (FOR PTA / DISCHARGE USE ONLY): 2.0000 g | INTRAVENOUS | 0 refills | Status: DC

## 2021-01-05 MED ORDER — POTASSIUM CHLORIDE CRYS ER 20 MEQ PO TBCR
40.0000 meq | EXTENDED_RELEASE_TABLET | Freq: Two times a day (BID) | ORAL | Status: AC
  Administered 2021-01-05 (×2): 40 meq via ORAL
  Filled 2021-01-05 (×2): qty 2

## 2021-01-05 MED ORDER — SODIUM CHLORIDE 0.9 % IV SOLN
2.0000 g | INTRAVENOUS | Status: DC
  Administered 2021-01-05 – 2021-01-06 (×2): 2 g via INTRAVENOUS
  Filled 2021-01-05 (×3): qty 20

## 2021-01-05 NOTE — Consult Note (Addendum)
I have seen and examined the patient. I have personally reviewed the clinical findings, laboratory findings, microbiological data and imaging studies. The assessment and treatment plan was discussed with the  Advance Practice Provider, Janene Madeira.  I agree with her/his recommendations except following additions/corrections.  65 Year old female with PMH of bilateral knee replacements several years ago with left hip arthroplasty in 03/2020 with post op course complicated by a swound dehiscence that eventually healed without surgical intervention with wound care and PO abtx ( Doxycycline, wound cx with corynebacterium spp) admitted for concerns of Left PJI in the setting of drainage from the left hip incision site. Of note, patient was covid positive in January 2022  with some post covid symptoms and also also had steroid injection for sacroiliitis.  Afebrile with WBC 11.4 on presentation Left knee pain, no signs of septic joint   S/p I and D pf left hip, left hip polyethylene liner exchange and hip ball exchange. OR cultures with Proteus mirabilis  Continue ceftriaxone, follow up cultures and can adjust abtx as appropriate Plan for 6 weeks course from 8/9 with PO abtx thereafter for approx 3 months Monitor CBC and CMP PICC line in place Will get a blood culture given fever A follow up with [facility] will be made on discharge   Dr Juleen China will follow cultures over the weekend and make changes as needed. Otherwise new ID team will follow on Monday.   Rosiland Oz, MD [place] [facility] [place] [facility]    Date of Admission:  01/02/2021      Total days of antibiotics 3   Vancomycin through 8/12  Ceftriaxone 8/12 >> current        Reason for Consult: Left Prosthetic Hip Joint Infection     Referring Provider: Ninfa Linden  Primary Care Provider: Erven Colla, DO    Assessment: Sharon [name] is a 65  y.o. female with history of obesity, remote B/L knee replacements and recent Left THR 03-2020. COVID infection January/February with slow recovery. Admitted with concerns over sinus tract communicating to prosthetic hip joint.   She first noticed that her left hip started hurting around March of this year intermittently. Takes narcotics/APAP with NSAIDs for pain fairly routinely for pains. Over the last week she had fullness/swelling sensation at the left hip with spontaneous drainage from incision site prior to admission. She was taken to OR for liner/ball exchange with I&D of purulent material that was found to be tracking down into and surrounding the prosthesis. She was started on vancomycin post op with growth on 8/11 from both intraoperative cultures with proteus mirabilis. She has been started on ceftriaxone with sensitivities pending. PICC line has been placed. She would like to wait if possible for sensitivities of organism to ensure we have plan set before going home, which I agree with. Will continue ceftriaxone for now and follow for susceptibilities, hopefully back tomorrow. Discussed typical standard of care treatment for hip PJI with 6 week IV therapy and oral antibiotics for 3 months thereafter.   H/O significant diarrhea with Augmentin in the past - I think she would do better with cefazolin (pending susceptibilities) to avoid diarrhea/cdiff risk with prolonged ceftriaxone.   Vascular access = picc in place. Orders to be placed regarding care/maintenance and labs once OPAT finalized.   Disposition = she has help at home and TID infusions are a doable option for her.  Dr. Juleen China will follow up cultures over weekend and make final OPAT recommendations.   Plan: Continue ceftriaxone 2gm IV for now Hopeful to switch to Cefazolin 2gm IV Q8h for long term OPAT plan ?Left TKR pain - > looks ok but would ask Dr. Ninfa Linden to assess   Further recs tomorrow once reports back.     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 female admitted from home with drainage from her left hip wound.   PMHx detailed below but significant for left total hip arthoplasty November-2021 of osteoarthritis, obesity with BMI 39 with recent weight reduction of 30#s.  B/L Knee replacements previously done > 5 years ago.   Her original hip replacement recovery last November went very well initially; did have some local tissue breakdown of adipose tissue that did well with local wound care through [facility] and eventually fully healed over nicely.  COVD infection in January/Feb 2022 with slow to resolve URI/fatigue symptoms.  She has had recent SI joint injections for significant sacroiliitis pain with the last one 12/07/2020.    The week prior to admission she started to experience a fullness to the left hip. Prior to this just found that she was not feeling well but could not localize as to why. One morning last week after she woke up she noticed serosanguinous flid coming from the left groin incision.  Followed by Dr. Ninfa Linden and seen in the office prior to admit with recommendations to proceed with DAIR procedure with exchange of poly liner and hip ball exchange. He took her to OR for I&D on 01/02/21 with operative findings indicating: purulence that tracked down deep into the hip joint itself with purulent material surrounding the prosthesis.  Intraoperative cultures are growing proteus mirabilis x 2 with susceptibilities  pending.   CRP 9.4 mg/dL  ESR 85 WBC 11.4 >> 9.3 today  She states the left hip is sore from the wound vac dressing but overall she can walk on it pretty well with assistance. She has a headache today she attributes to poor sleep last night. She also is concerned about her left knee prosthetic. This morning it hurts a lot. Feels a little stiff. Has ice on the lateral aspect of the knee which helps the pain.   She voiced that she would like cardiology to see her while she is here since she will miss FU due to current hospitalization and still struggles with PVCs and HTN. PICC line in place as of 8/11. She lives alone but her daughter is up from [facility] to help with her care. 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 Does not smoke (quit 2008).    Review of Systems: Review of Systems  Constitutional:  Positive for malaise/fatigue. Negative for chills and fever.  HENT:  Negative for tinnitus.   Eyes:  Negative for blurred vision and photophobia.  Respiratory:  Negative for  cough and sputum production.   Cardiovascular:  Positive for palpitations. Negative for chest pain.  Gastrointestinal:  Negative for diarrhea, nausea and vomiting.  Genitourinary:  Negative for dysuria.  Musculoskeletal:  Positive for joint pain (lt knee and hip).  Skin:  Negative for rash.  Neurological:  Positive for headaches.    Past Medical History:  Diagnosis Date   Anginal pain ([place])    in February 2683   Biliary colic    Cancer ([place])    melanoma: lt. inner thight   Carpal tunnel syndrome    bilateral   Chronic allergic rhinitis    Chronic urticaria    Complication of anesthesia    confusion for a couple of days after waking after having multiple knee surgery's, does well with spinal   Degenerative disk disease    Diverticulosis of colon    Dyspnea    Dysrhythmia    February 2022   Fatty liver    Fibrocystic breast disease 1996   GERD (gastroesophageal reflux disease)    History of colon polyps    HYPERPLASTIC 09-14-2015    History of kidney stones    passed on own   History of melanoma excision    left thigh ,inside 2009--- per pt also has had severeal excision of office for almost melanoma   Hypertension    Migraine    since age 4 not as frequent now   Moderate persistent asthma, uncomplicated    followed by dr gallagher (cone allergy and asthma)   OA (osteoarthritis)    all joints   SUI (stress urinary incontinence, female)    Past Surgical History:  Procedure Laterality Date   ANTERIOR HIP REVISION Left 01/02/2021   Procedure: IRRIGATION AND DEBRIDEMENT LEFT HIP WITH HIP BALL AND POLY ETHYLENE LINER EXCHANGE.;  Surgeon: Mcarthur Rossetti, MD;  Location: [place];  Service: Orthopedics;  Laterality: Left;  needs RNFA   APPENDECTOMY  4196   APPLICATION OF WOUND VAC Left 01/02/2021   Procedure: APPLICATION OF WOUND VAC;  Surgeon: Mcarthur Rossetti, MD;  Location: [place];  Service: Orthopedics;  Laterality: Left;   BREAST EXCISIONAL BIOPSY Right 1994;  10-01-2009   2011 per path mild ductal hyperplasia/ fibrocystic breast   BREAST SURGERY     CHOLECYSTECTOMY N/A 01/30/2017   Procedure: LAPAROSCOPIC CHOLECYSTECTOMY;  Surgeon: Kieth Brightly Arta Bruce, MD;  Location: [facility];  Service: General;  Laterality: N/A;   COLONOSCOPY N/A 09/14/2015   Procedure: COLONOSCOPY;  Surgeon: Daneil Dolin, MD;  [facility];  Service: Endoscopy;  Laterality: N/A;  930 - moved to 9:15 - office to notify   ESOPHAGOGASTRODUODENOSCOPY (EGD) WITH PROPOFOL N/A 11/29/2016   Procedure: ESOPHAGOGASTRODUODENOSCOPY (EGD) WITH PROPOFOL;  Surgeon: Doran Stabler, MD;  [facility];  Service: Gastroenterology;  Laterality: N/A;   JOINT REPLACEMENT     knee replacement and left hip replacement   KNEE ARTHROSCOPY W/ MENISCECTOMY Bilateral left 12-19-2006;  right 01-26-2010   LAPAROSCOPIC GASTRIC SLEEVE RESECTION N/A 08/16/2019   Procedure: LAPAROSCOPIC GASTRIC SLEEVE RESECTION, Upper Endo,  ERAS Pathway;  Surgeon: Kinsinger, Arta Bruce, MD;  [facility];  Service: General;  Laterality: N/A;   [place] Left 04/14/2020   Procedure: LEFT TOTAL HIP ARTHROPLASTY ANTERIOR APPROACH;  Surgeon: Mcarthur Rossetti, MD;  [facility];  Service: Orthopedics;  Laterality: Left;   TOTAL KNEE ARTHROPLASTY Left 03/10/2007   TOTAL KNEE ARTHROPLASTY  06/17/2011   Procedure:  TOTAL KNEE ARTHROPLASTY;  Surgeon: Arther Abbott, MD;  [facility];  Service: Orthopedics;  Laterality: Right;  [place]   TOTAL KNEE REVISION Left 07/08/2010  (spinal anesthesia)   TUBAL LIGATION Bilateral YRS AGO   VAGINAL HYSTERECTOMY  1988    Social History   Tobacco Use   Smoking status: Former    Packs/day: 1.00    Years: 25.00    Pack years: 25.00    Types: Cigarettes    Start date: 08/02/1972    Quit date: 05/27/2006    Years since quitting: 14.6   Smokeless tobacco: Never  Vaping Use   Vaping Use: Never used  Substance Use Topics   Alcohol use: Yes    Alcohol/week: 0.0 standard drinks    Comment: occas.   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 rate 17, height 5' (1.524 m), weight 90.7 kg, SpO2 100 %.  Physical Exam Vitals and nursing note reviewed.  Constitutional:      Appearance: Normal appearance. She is not ill-appearing.     Comments: Sleeping in bed upon arrival. Awakens easily. Pleasant.   HENT:     Mouth/Throat:     Mouth: Mucous membranes are moist.     Pharynx: Oropharynx is clear.  Eyes:     General: No scleral icterus.    Pupils: Pupils are equal, round, and reactive to light.  Cardiovascular:     Rate and Rhythm: Normal rate.  Pulmonary:     Effort: Pulmonary effort is normal.  Musculoskeletal:     Comments: Lt hip incision intact/dry with wound vac correctly engaged. No peri-wound erythema or induration.   Lt knee with tenderness to the lateral patella. No induration or effusion. Well-healed previous surgical incision. Did not assess ROM.   Skin:    General: Skin is warm and dry.     Capillary Refill: Capillary refill takes less than 2 seconds.  Neurological:     Mental Status: She is alert and oriented to person, place, and time.    Lab Results Lab Results  Component Value Date   WBC 9.3 01/04/2021   HGB 8.9 (L) 01/04/2021   HCT 27.4 (L) 01/04/2021   MCV 96.5 01/04/2021   PLT 315 01/04/2021    Lab Results  Component Value Date   CREATININE 0.66 01/04/2021   BUN 11 01/04/2021   NA 138  01/04/2021   K 3.0 (L) 01/04/2021   CL 105 01/04/2021   CO2 27 01/04/2021    Lab Results  Component Value Date   ALT 39 08/17/2019   AST 32 08/17/2019   ALKPHOS 63 08/17/2019   BILITOT 0.9 08/17/2019     Microbiology: Recent Results (from the past 240 hour(s))  SARS Coronavirus 2 (TAT 6-24 hrs)     Status: None   Collection Time: 01/01/21 12:00 AM  Result Value Ref Range Status   SARS Coronavirus 2 RESULT: INVALID  Final    Comment: RESULT: INVALIDInvalid results represent failure of internal amplification control or failure of all  targets to yield a valid result. Analysis has been repeated prior to reporting. Findings may represent a PCR inhibitor or other undetermined  specimen-related issue. A new sample should be obtained.Fact Sheet for Healthcare Providers: https://www.woods-mathews.com/.Fact Sheet for Patients: SugarRoll.be.Normal Reference Range - Negative   SARS Coronavirus 2 by RT PCR (hospital order, performed in [facility] hospital lab) Nasopharyngeal Nasopharyngeal Swab     Status: None   Collection Time: 01/02/21  1:46 PM   Specimen: Nasopharyngeal Swab  Result Value Ref Range Status   SARS Coronavirus 2 NEGATIVE NEGATIVE Final    Comment: (NOTE) SARS-CoV-2 target nucleic acids are NOT DETECTED.  The SARS-CoV-2 RNA is generally detectable in upper and lower respiratory specimens during the acute phase of infection. The lowest concentration of SARS-CoV-2 viral copies this assay can detect is 250 copies / mL. A negative result does not preclude SARS-CoV-2 infection and should not be used as the sole basis for treatment or other patient management decisions.  A negative result may occur with improper specimen collection / handling, submission of specimen other than nasopharyngeal swab, presence of viral mutation(s) within the areas targeted by this assay, and inadequate number of viral copies (<250 copies / mL). A negative result must be combined with clinical observations, patient history, and epidemiological information.  Fact Sheet for Patients:   StrictlyIdeas.no  Fact Sheet for Healthcare Providers: BankingDealers.co.za  This test is not yet approved or  cleared by the [place] FDA and has been authorized for detection and/or diagnosis of SARS-CoV-2 by FDA under an Emergency Use Authorization (EUA).  This EUA will remain in effect (meaning this test can be used) for the duration of the COVID-19 declaration  under Section 564(b)(1) of the Act, 21 U.S.C. section 360bbb-3(b)(1), unless the authorization is terminated or revoked sooner.  Performed at [facility], [place] [address] [place], [place] [address]   Fungus Culture With Stain     Status: None (Preliminary result)   Collection Time: 01/02/21  5:36 PM   Specimen: Soft Tissue, Other  Result Value Ref Range Status   Fungus Stain Final report  Final    Comment: (NOTE) Performed At: [facility] [place], [place] [id number] Rush Farmer MD [id number]    Fungus (Mycology) Culture PENDING  Incomplete   Fungal Source FLUID  Final    Comment: LEFT HIP SWAB SAMPLE A Performed at [facility], [place] [address] [place], [place] [address]   Aerobic/Anaerobic Culture w Gram Stain (surgical/deep wound)     Status: None (Preliminary result)   Collection Time: 01/02/21  5:36 PM   Specimen: Soft Tissue, Other  Result Value Ref Range Status   Specimen Description FLUID LEFT HIP  Final   Special Requests SWAB SAMPLE A  Final   Gram Stain   Final    ABUNDANT WBC  PRESENT, PREDOMINANTLY PMN NO ORGANISMS SEEN    Culture   Final    RARE PROTEUS MIRABILIS CULTURE REINCUBATED FOR BETTER GROWTH Performed at [facility], [place] [address], [place], [place] [address]    Report Status PENDING  Incomplete  Fungus Culture Result     Status: None   Collection Time: 01/02/21  5:36 PM  Result Value Ref Range Status   Result 1 Comment  Final    Comment: (NOTE) KOH/Calcofluor preparation:  no fungus observed. Performed At: [facility] [address] [place], [place] [id number] Rush Farmer MD [id number]   Fungus Culture With Stain     Status: None (Preliminary result)   Collection Time: 01/02/21  5:56 PM   Specimen: Soft Tissue, Other  Result Value Ref Range Status   Fungus Stain Final report  Final    Comment: (NOTE) Performed At: [facility] [place], [place]  [id number] Rush Farmer MD YC:[id number]    Fungus (Mycology) Culture PENDING  Incomplete   Fungal Source FLUID  Final    Comment: LEFT HIP SWAB Performed at [facility], [place] [address], [place], [place] [address]   Aerobic/Anaerobic Culture w Gram Stain (surgical/deep wound)     Status: None (Preliminary result)   Collection Time: 01/02/21  5:56 PM   Specimen: Soft Tissue, Other  Result Value Ref Range Status   Specimen Description FLUID LEFT HIP SWAB  Final   Special Requests NONE  Final   Gram Stain   Final    ABUNDANT WBC PRESENT,BOTH PMN AND MONONUCLEAR NO ORGANISMS SEEN    Culture   Final    RARE PROTEUS MIRABILIS CULTURE REINCUBATED FOR BETTER GROWTH Performed at [facility], [place] [address] [place],  [address]    Report Status PENDING  Incomplete  Fungus Culture Result     Status: None   Collection Time: 01/02/21  5:56 PM  Result Value Ref Range Status   Result 1 Comment  Final    Comment: (NOTE) KOH/Calcofluor preparation:  no fungus observed. Performed At: [facility] [address] [place], [place] [id number] Rush Farmer MD OI:[id number]     Janene Madeira, MSN, NP-C [facility] [place] [facility] Cell: [phone] Pager: [phone]  01/05/2021 12:02 PM

## 2021-01-05 NOTE — Care Management Important Message (Signed)
Important Message  Patient Details  Name: Sharon [name] MRN: [id number] Date of Birth: 09/22/1955   Medicare Important Message Given:  Yes     Tishara Pizano [address] 01/05/2021, 5:00 PM

## 2021-01-05 NOTE — Plan of Care (Signed)

## 2021-01-05 NOTE — Progress Notes (Signed)
Physical Therapy Treatment Patient Details Name: Sharon [name] MRN: [id number] DOB: 1956/01/27 Today's Date: 01/05/2021    History of Present Illness Pt is a 65 y.o. female admitted 01/02/21 with L hip prosthethic joint infection since new prosthesis placed 03/2020. Pt s/p L hip I&D and hip ball exchange on 8/9. PMH includes HTN, OA, DDD, melanoma.   PT Comments    Pt progressing well with mobility. Today's session focused on transfer, gait and stair training. Pt mod indep with bed mobility and transfers with RW. Able to increase ambulation distance and perform stair training at supervision-level. Pt with good awareness of LLE precautions, positioning and importance of mobility. Will continue to follow acutely.    Follow Up Recommendations  Home health PT;Supervision - Intermittent     Equipment Recommendations  None recommended by PT    Recommendations for Other Services       Precautions / Restrictions Precautions Precautions: Fall;Other (comment) Precaution Comments: L hip wound vac Restrictions Weight Bearing Restrictions: Yes LLE Weight Bearing: Weight bearing as tolerated    Mobility  Bed Mobility Overal bed mobility: Modified Independent       Supine to sit: Modified independent (Device/Increase time);HOB elevated     General bed mobility comments: Use of RLE to assist LLE to EOB, HOB elevated, increased time and effort    Transfers Overall transfer level: Modified independent Equipment used: Rolling walker (2 wheeled) Transfers: Sit to/from Stand           General transfer comment: Multiple sit<>stands from EOB and BSC to RW, mod indep with good hand placement and technique  Ambulation/Gait Ambulation/Gait assistance: Supervision Gait Distance (Feet): 120 Feet Assistive device: Rolling walker (2 wheeled) Gait Pattern/deviations: Step-through pattern;Decreased stride length;Antalgic;Decreased weight shift to left;Trunk flexed Gait velocity:  Decreased Gait velocity interpretation: <1.31 ft/sec, indicative of household ambulator General Gait Details: Slow, antalgic gait with RW and supervision for safety/lines; pt with preference to slightly pick up RW instead of consistently rolling it forward; no overt instability or LOB   Stairs Stairs: Yes Stairs assistance: Supervision Stair Management: Two rails;Step to pattern;Forwards Number of Stairs: 2 General stair comments: Ascend/descend 2 steps with bilateral rail support, supervision for safety; pt aware of correct technique from prior experience and able to perform without cues; typically does stairs at home with single rail support and [facility]   Wheelchair Mobility    Modified Rankin (Stroke Patients Only)       Balance Overall balance assessment: Needs assistance Sitting-balance support: Feet supported Sitting balance-Leahy Scale: Good     Standing balance support: Bilateral upper extremity supported;During functional activity;No upper extremity supported Standing balance-Leahy Scale: Fair Standing balance comment: Can static stand without UE support, able to perform pericare standing at [facility]; reliant on UE support to offload painful LE with ambulation                            Cognition Arousal/Alertness: Awake/alert Behavior During Therapy: WFL for tasks assessed/performed Overall Cognitive Status: Within Functional Limits for tasks assessed                                        Exercises      General Comments        Pertinent Vitals/Pain Pain Assessment: Faces Faces Pain Scale: Hurts a little bit Pain Location: L hip, gluts Pain Descriptors /  Indicators: Discomfort;Sore;Spasm Pain Intervention(s): Monitored during session;Premedicated before session    Home Living                      Prior Function            PT Goals (current goals can now be found in the care plan section) Progress towards PT goals:  Progressing toward goals    Frequency    Min 5X/week      PT Plan Current plan remains appropriate    Co-evaluation              AM-PAC PT "6 Clicks" Mobility   Outcome Measure  Help needed turning from your back to your side while in a flat bed without using bedrails?: None Help needed moving from lying on your back to sitting on the side of a flat bed without using bedrails?: A Little Help needed moving to and from a bed to a chair (including a wheelchair)?: A Little Help needed standing up from a chair using your arms (e.g., wheelchair or bedside chair)?: A Little Help needed to walk in hospital room?: A Little Help needed climbing 3-5 steps with a railing? : A Little 6 Click Score: 19    End of Session   Activity Tolerance: Patient tolerated treatment well Patient left: in chair;with call bell/phone within reach Nurse Communication: Mobility status PT Visit Diagnosis: Unsteadiness on feet (R26.81);Muscle weakness (generalized) (M62.81);Difficulty in walking, not elsewhere classified (R26.2);Pain Pain - Right/Left: Left Pain - part of body: Hip     Time: [id number] PT Time Calculation (min) (ACUTE ONLY): 27 min  Charges:  $Gait Training: 8-22 mins $Therapeutic Activity: 8-22 mins                     Mabeline Caras, PT, DPT Acute Rehabilitation Services  Pager [phone] Office [place] 01/05/2021, 11:12 AM

## 2021-01-05 NOTE — Progress Notes (Signed)
Patient ID: Sharon [name], female   DOB: 02-Jun-1955, 65 y.o.   MRN: [id number] I appreciate the infectious disease service consult and their recommendations.  The patient has spiked some temperatures today and does not feel well.  My plan is to keep her here through the weekend.  She has a VAC over a closed incision.  I will likely remove this on Monday prior to discharge to home.  She does have a PICC line in place.  I will check a new set of labs over the weekend as well.

## 2021-01-05 NOTE — Progress Notes (Signed)
PHARMACY CONSULT NOTE FOR:  OUTPATIENT  PARENTERAL ANTIBIOTIC THERAPY (OPAT)  Indication: hip wound infection Regimen: Rocephin 2g IV q24h End date: 02/16/21  IV antibiotic discharge orders are pended. To discharging provider:  please sign these orders via discharge navigator,  Select New Orders & click on the button choice - Manage This Unsigned Work.     Thank you for allowing pharmacy to be a part of this patient's care.   Shamica Moree S. Alford Highland, PharmD, BCPS Clinical Staff Pharmacist [email or web address] [facility] 01/05/2021, 11:20 AM

## 2021-01-06 LAB — CBC
HCT: 26.3 % — ABNORMAL LOW (ref 36.0–46.0)
Hemoglobin: 8.5 g/dL — ABNORMAL LOW (ref 12.0–15.0)
MCH: 31.5 pg (ref 26.0–34.0)
MCHC: 32.3 g/dL (ref 30.0–36.0)
MCV: 97.4 fL (ref 80.0–100.0)
Platelets: 301 10*3/uL (ref 150–400)
RBC: 2.7 MIL/uL — ABNORMAL LOW (ref 3.87–5.11)
RDW: 13.9 % (ref 11.5–15.5)
WBC: 7.4 10*3/uL (ref 4.0–10.5)
nRBC: 0 % (ref 0.0–0.2)

## 2021-01-06 LAB — BASIC METABOLIC PANEL
Anion gap: 6 (ref 5–15)
BUN: 7 mg/dL — ABNORMAL LOW (ref 8–23)
CO2: 25 mmol/L (ref 22–32)
Calcium: 7.8 mg/dL — ABNORMAL LOW (ref 8.9–10.3)
Chloride: 102 mmol/L (ref 98–111)
Creatinine, Ser: 0.59 mg/dL (ref 0.44–1.00)
GFR, Estimated: >60 mL/min (ref >60)
Glucose, Bld: 107 mg/dL — ABNORMAL HIGH (ref 70–99)
Potassium: 3.5 mmol/L (ref 3.5–5.1)
Sodium: 133 mmol/L — ABNORMAL LOW (ref 135–145)

## 2021-01-06 NOTE — Progress Notes (Signed)
   01/06/21 1237  Vitals  Temp (!) 103 F (39.4 C)  Temp Source Oral  BP 125/68  MAP (mmHg) 85  BP Location Right Arm  BP Method Automatic  Patient Position (if appropriate) Lying  Pulse Rate 88  Pulse Rate Source Monitor  Resp 17  Level of Consciousness  Level of Consciousness Alert  MEWS COLOR  MEWS Score Color Yellow  Oxygen Therapy  SpO2 99 %  O2 Device Room Air  Pain Assessment  Pain Scale 0-10  Pain Score 8  Pain Type Surgical pain  Pain Location Hip  Pain Orientation Left  Pain Descriptors / Indicators Aching  Pain Frequency Intermittent  Pain Onset Gradual  Pain Intervention(s) Medication (See eMAR)  MEWS Score  MEWS Temp 2  MEWS Systolic 0  MEWS Pulse 0  MEWS RR 0  MEWS LOC 0  MEWS Score 2  Provider Notification  Provider Name/Title n/a (MD already aware, blood cultures done the day before)  Rapid Response Notification  Name of Rapid Response RN Notified n/a   Pt complaining of chills, vital signs taken and recorded, warm blanket provided, PRN meds given, MD already aware of recurring fever, Pt followed by ID and on IV antibiotic. Pt's daughter at bedside, will continue to monitor Pt.

## 2021-01-06 NOTE — Plan of Care (Signed)

## 2021-01-06 NOTE — Progress Notes (Signed)
Sharon [name] for Infectious Disease  Date of Admission:  01/02/2021           Reason for visit: Follow up on left prosthetic hip joint infection  Current antibiotics: Ceftriaxone 8/12--present  Previous antibiotics: Vancomycin 8/9--8/12   ASSESSMENT:    Fevers: Over the last 24 hours she has developed high spiking fevers of unclear source.  WBC remains normal and 1 set of peripheral blood cultures is no growth at this time. Left prosthetic hip infection: Status post I&D with polyethylene liner exchange and hip ball exchange 01/02/2021 with orthopedic surgery.  Cultures growing Proteus mirabilis with sensitivities pending and she is on ceftriaxone.  PLAN:    Continue ceftriaxone 2 g daily Follow OR cultures from 8/9 for susceptibilities Will check another set of blood cultures x2 to increase yield as only 1 set was obtained yesterday  Monitor fever curve.  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, subsequent encounter    MEDICATIONS:    Scheduled Meds:  amLODipine  10 mg Oral Daily   aspirin EC  81 mg Oral BID   Chlorhexidine Gluconate Cloth  6 each Topical Daily   docusate sodium  100 mg Oral BID   fluticasone  2 spray Each Nare QHS   gabapentin  300 mg Oral BID   metoprolol tartrate  25 mg Oral BID   pantoprazole  40 mg Oral Daily   Continuous Infusions:  sodium chloride 75 mL/hr at 01/04/21 2220   cefTRIAXone (ROCEPHIN)  IV 2 g (01/06/21 1240)   PRN Meds:.acetaminophen, albuterol, diphenhydrAMINE, HYDROmorphone (DILAUDID) injection, methocarbamol, metoCLOPramide **OR** metoCLOPramide (REGLAN) injection, ondansetron **OR** ondansetron (ZOFRAN) IV, oxyCODONE, oxyCODONE, polyethylene glycol, sodium chloride flush  SUBJECTIVE:   24 hour  events:  Persistently febrile over the last 24 hours.  T-max 103.2 WBC 7.4 1 single blood culture was obtained yesterday afternoon at 6:45 PM which is currently no growth at 24 hours.  This appears to have been drawn due to her fevers which started yesterday around 1 PM. No new imaging. PICC line placed on 8/11. OR cultures from 8/9 with Proteus mirabilis  Patient reports fevers and chills.  She also endorses headache but no significant neck pain.  No vision changes, no respiratory complaints.  No new joint pains.  She feels like she is about to have another fever this afternoon.  Review of Systems  All other systems reviewed and are negative.    OBJECTIVE:   Blood pressure (!) 100/45, pulse 81, temperature 98.9 F (37.2 C), temperature source Oral, resp. rate 19, height 5' (1.524 m), weight 90.7 kg, SpO2 96 %. Body mass index is 39.06 kg/m.  Physical Exam Constitutional:      General: She is not in acute distress.    Appearance: Normal appearance.  HENT:     Head: Normocephalic and atraumatic.  Cardiovascular:     Rate and Rhythm: Normal rate and regular rhythm.     Heart sounds: No murmur heard. Pulmonary:     Effort: Pulmonary effort is normal. No respiratory distress.     Breath sounds: Normal breath sounds.  Abdominal:     General: Bowel sounds are normal. There is no distension.     Palpations: Abdomen is soft.     Tenderness: There is no abdominal tenderness.  Musculoskeletal:     Comments: Left hip incision w/o cellulitis.  Left UE Picc without erythema or tenderness.   Skin:    General: Skin is warm and dry.  Neurological:     General: No focal deficit present.     Mental Status: She is alert and oriented to person, place, and time.  Psychiatric:        Mood and Affect: Mood normal.        Behavior: Behavior normal.     Lab Results: Lab Results  Component Value Date   WBC 7.4 01/06/2021   HGB 8.5 (L) 01/06/2021   HCT 26.3 (L) 01/06/2021   MCV 97.4  01/06/2021   PLT 301 01/06/2021    Lab Results  Component Value Date   NA 133 (L) 01/06/2021   K 3.5 01/06/2021   CO2 25 01/06/2021   GLUCOSE 107 (H) 01/06/2021   BUN 7 (L) 01/06/2021   CREATININE 0.59 01/06/2021   CALCIUM 7.8 (L) 01/06/2021   GFRNONAA >60 01/06/2021   GFRAA >60 08/17/2019    Lab Results  Component Value Date   ALT 39 08/17/2019   AST 32 08/17/2019   ALKPHOS 63 08/17/2019   BILITOT 0.9 08/17/2019       Component Value Date/Time   CRP 9.4 (H) 01/03/2021 0220   CRP 34 (H) 10/13/2020 1540       Component Value Date/Time   ESRSEDRATE 85 (H) 01/03/2021 0220     I have reviewed the micro and lab results in Epic.  Imaging: No results found.   Imaging independently reviewed in Epic.    Raynelle Highland for Infectious Disease [facility] [phone] pager 01/06/2021, 2:52 PM  I spent greater than 35 minutes with the patient including greater than 50% of time in face to face counsel of the patient and in coordination of their care.

## 2021-01-06 NOTE — Progress Notes (Signed)
  Subjective: Patient stable.  Reports occasional fevers and chills.  Pain overall well controlled   Objective: Vital signs in last 24 hours: Temp:  [98.5 F (36.9 C)-103.2 F (39.6 C)] 100.6 F (38.1 C) (08/12 2147) Pulse Rate:  [80-98] 86 (08/12 2147) Resp:  [17-20] 18 (08/12 2147) BP: (102-137)/(46-64) 102/46 (08/12 2147) SpO2:  [94 %-100 %] 95 % (08/12 2147)  Intake/Output from previous day: 08/12 0701 - 08/13 0700 In: 387.9 [I.V.:287.9; IV Piggyback:100] Out: -  Intake/Output this shift: No intake/output data recorded.  Exam:  Sensation intact distally Intact pulses distally Dorsiflexion/Plantar flexion intact  Labs: Recent Labs    01/04/21 1157 01/06/21 0406  HGB 8.9* 8.5*   Recent Labs    01/04/21 1157 01/06/21 0406  WBC 9.3 7.4  RBC 2.84* 2.70*  HCT 27.4* 26.3*  PLT 315 301   Recent Labs    01/04/21 1157 01/06/21 0406  NA 138 133*  K 3.0* 3.5  CL 105 102  CO2 27 25  BUN 11 7*  CREATININE 0.66 0.59  GLUCOSE 188* 107*  CALCIUM 8.0* 7.8*   No results for input(s): LABPT, INR in the last 72 hours.  Assessment/Plan: Impression is left hip infection with no cellulitis around incision.  Patient has had some recurrent spiking fevers.  Currently on antibiotics.  We will have to see how she does clinically with tentative plan for discharge on Monday.   Sharon [name] 01/06/2021, 7:27 AM

## 2021-01-07 ENCOUNTER — Inpatient Hospital Stay (HOSPITAL_COMMUNITY): Payer: PPO

## 2021-01-07 DIAGNOSIS — R509 Fever, unspecified: Secondary | ICD-10-CM

## 2021-01-07 LAB — COMPREHENSIVE METABOLIC PANEL
ALT: 14 U/L (ref 0–44)
AST: 19 U/L (ref 15–41)
Albumin: 1.9 g/dL — ABNORMAL LOW (ref 3.5–5.0)
Alkaline Phosphatase: 82 U/L (ref 38–126)
Anion gap: 9 (ref 5–15)
BUN: 9 mg/dL (ref 8–23)
CO2: 27 mmol/L (ref 22–32)
Calcium: 8 mg/dL — ABNORMAL LOW (ref 8.9–10.3)
Chloride: 98 mmol/L (ref 98–111)
Creatinine, Ser: 0.65 mg/dL (ref 0.44–1.00)
GFR, Estimated: >60 mL/min (ref >60)
Glucose, Bld: 115 mg/dL — ABNORMAL HIGH (ref 70–99)
Potassium: 3.4 mmol/L — ABNORMAL LOW (ref 3.5–5.1)
Sodium: 134 mmol/L — ABNORMAL LOW (ref 135–145)
Total Bilirubin: 0.2 mg/dL — ABNORMAL LOW (ref 0.3–1.2)
Total Protein: 5.3 g/dL — ABNORMAL LOW (ref 6.5–8.1)

## 2021-01-07 LAB — CBC WITH DIFFERENTIAL/PLATELET
Abs Immature Granulocytes: 0.06 10*3/uL (ref 0.00–0.07)
Basophils Absolute: 0 10*3/uL (ref 0.0–0.1)
Basophils Relative: 1 %
Eosinophils Absolute: 0.1 10*3/uL (ref 0.0–0.5)
Eosinophils Relative: 2 %
HCT: 26.5 % — ABNORMAL LOW (ref 36.0–46.0)
Hemoglobin: 8.4 g/dL — ABNORMAL LOW (ref 12.0–15.0)
Immature Granulocytes: 1 %
Lymphocytes Relative: 17 %
Lymphs Abs: 1 10*3/uL (ref 0.7–4.0)
MCH: 30.7 pg (ref 26.0–34.0)
MCHC: 31.7 g/dL (ref 30.0–36.0)
MCV: 96.7 fL (ref 80.0–100.0)
Monocytes Absolute: 0.7 10*3/uL (ref 0.1–1.0)
Monocytes Relative: 12 %
Neutro Abs: 4 10*3/uL (ref 1.7–7.7)
Neutrophils Relative %: 67 %
Platelets: 281 10*3/uL (ref 150–400)
RBC: 2.74 MIL/uL — ABNORMAL LOW (ref 3.87–5.11)
RDW: 13.6 % (ref 11.5–15.5)
WBC: 5.8 10*3/uL (ref 4.0–10.5)
nRBC: 0 % (ref 0.0–0.2)

## 2021-01-07 LAB — AEROBIC/ANAEROBIC CULTURE W GRAM STAIN (SURGICAL/DEEP WOUND)

## 2021-01-07 MED ORDER — CEFAZOLIN SODIUM-DEXTROSE 2-4 GM/100ML-% IV SOLN
2.0000 g | Freq: Three times a day (TID) | INTRAVENOUS | Status: DC
  Administered 2021-01-07 – 2021-01-09 (×7): 2 g via INTRAVENOUS
  Filled 2021-01-07 (×8): qty 100

## 2021-01-07 NOTE — Progress Notes (Addendum)
PHARMACY CONSULT NOTE FOR:  OUTPATIENT  PARENTERAL ANTIBIOTIC THERAPY (OPAT)  Indication: hip wound infection Regimen: cefazolin IV 2g q8h End date: 02/16/2021  IV antibiotic discharge orders are pended. To discharging provider:  please sign these orders via discharge navigator,  Select New Orders & click on the button choice - Manage This Unsigned Work.     Thank you for allowing pharmacy to be a part of this patient's care.  Shauna Hugh, PharmD, [place]  PGY-2 Pharmacy Resident 01/07/2021 10:04 AM  Please check [email or web address] for unit-specific pharmacy phone numbers.

## 2021-01-07 NOTE — Progress Notes (Signed)
Patient stable.  Did have T-max of 103.3 overnight but currently she is afebrile.  Denies any current fevers or chills and reports that the frequency of the fevers and chills that she has been experiencing over the last 3 days is steadily improving.  Denies any chest pain, shortness of breath, calf pain.  She had ultrasound that was negative for DVT.  She had antibiotics switched by infectious disease today.  Examination shows wound VAC sponge with suction intact with no surrounding cellulitis.  Left foot is warm and well-perfused with no calf tenderness.  Plan to continue on IV antibiotics with possible discharge home tomorrow

## 2021-01-07 NOTE — Progress Notes (Signed)
Brief ID note:  Patient chart reviewed.  Continues to be febrile overnight with T-max 103.3.  Labs this morning with no evidence of leukocytosis.  Differential without any signs of eosinophilia.  CMP also unremarkable other than hypoalbuminemia.  PICC line remains in place and repeat blood cultures were drawn yesterday.  Her operative cultures from 01/02/2021 were updated with a pansensitive Proteus mirabilis.  Will change ceftriaxone to cefazolin given the updated susceptibilities.  Obtain lower extremity Dopplers in the setting of fevers and recent surgery (has been on aspirin twice daily for DVT prophylaxis).  No hypoxia to suggest pneumonia.  Will continue to follow.    Raynelle Highland for Infectious Disease [facility] 01/07/2021, 9:16 AM

## 2021-01-07 NOTE — Progress Notes (Signed)
Pharmacy Antibiotic Note  Sharon [name] is a 65 y.o. female admitted on 01/02/2021 with  hip wound infection .  Patient underwent I&D of left hip with hip ball and polyethylene linear exchange on 8/9. OR culture with Proteus mirabilis. Per ID, plan IV antibiotics x 6 weeks, followed by PO antibiotics x 3 months. Given updated susceptibilities, will change ceftriaxone to cefazolin. Patient continues to fever (Tmax 103.3), WBC 5.8 this morning. Pharmacy has been consulted for Cefazolin dosing.  Plan: Cefazolin 2g IV every 8 hours - will update OPAT. Monitor cultures, renal function, clinical status.   Height: 5' (152.4 cm) Weight: 90.7 kg (200 lb) IBW/kg (Calculated) : 45.5  Temp (24hrs), Avg:100.9 F (38.3 C), Min:98.3 F (36.8 C), Max:103.3 F (39.6 C)  Recent Labs  Lab 01/02/21 1400 01/03/21 0220 01/04/21 1157 01/06/21 0406 01/07/21 0321  WBC 10.0 11.4* 9.3 7.4 5.8  CREATININE 0.69 0.62 0.66 0.59 0.65    Estimated Creatinine Clearance: 69.5 mL/min (by C-G formula based on SCr of 0.65 mg/dL).    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   Adhesive [Tape] Hives and Dermatitis    Can tolerate tegaderm   Augmentin [Amoxicillin-Pot Clavulanate] Diarrhea and Nausea Only   Latex Hives   Nickel Hives   Neomycin-Bacitracin Zn-Polymyx Dermatitis    NEOSPORIN    Antimicrobials this admission: Cefazolin 8/14 >>  Ceftriaxone 8/12 >> 8/13 Vancomycin 8/9 >> 8/12  Dose adjustments this admission:   Microbiology results: 8/13 Bcx: ng < 24h 8/12 Bcx: ng x 2d 8/9 L hip fluid A (OR): pan-sensitive Proteus mirabilis  8/9 L hip swab: pan-sensitive Proteus mirabilis 8/9 Fungal cx: pending   Thank you for allowing pharmacy to be a part of this patient's care.  [name] Peper, PharmD PGY1 Pharmacy Resident 01/07/2021 9:33 AM   Please check AMION for all [facility] phone numbers After 10:00 PM, call [facility]  [phone]

## 2021-01-07 NOTE — Progress Notes (Signed)
VASCULAR LAB    Bilateral lower extremity venous duplex has been performed.  See CV proc for preliminary results.   Joselin Crandell, RVT 01/07/2021, 10:48 AM

## 2021-01-08 ENCOUNTER — Ambulatory Visit: Payer: PPO | Admitting: Cardiology

## 2021-01-08 DIAGNOSIS — R509 Fever, unspecified: Secondary | ICD-10-CM

## 2021-01-08 LAB — CBC
HCT: 27.6 % — ABNORMAL LOW (ref 36.0–46.0)
Hemoglobin: 8.9 g/dL — ABNORMAL LOW (ref 12.0–15.0)
MCH: 30.8 pg (ref 26.0–34.0)
MCHC: 32.2 g/dL (ref 30.0–36.0)
MCV: 95.5 fL (ref 80.0–100.0)
Platelets: 290 10*3/uL (ref 150–400)
RBC: 2.89 MIL/uL — ABNORMAL LOW (ref 3.87–5.11)
RDW: 13.6 % (ref 11.5–15.5)
WBC: 5.4 10*3/uL (ref 4.0–10.5)
nRBC: 0 % (ref 0.0–0.2)

## 2021-01-08 LAB — C-REACTIVE PROTEIN: CRP: 19.6 mg/dL — ABNORMAL HIGH (ref <1.0)

## 2021-01-08 NOTE — Plan of Care (Signed)

## 2021-01-08 NOTE — Progress Notes (Signed)
[facility] for Infectious Disease  Date of Admission:  01/02/2021      Total days of antibiotics 6   Cefazolin 8/14 >> current   Ceftriaxone           ASSESSMENT: Sharon [name] is a 65 y.o. 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 Intraoperative cultures are growing pan sensitive proteus mirabilis.   As she was prepping for discharge home she began to have high fevers to 103 F on 8/12. The cause of these fevers is not clear at this time. Her blood cultures are no growth from 2 sets. PICC line looks good and functioning well. No leukocytosis and no localizing symptoms aside from headaches (which she attributes to the chills and fatigue/lack of sleep). Possible consideration to drug fever with vancomycin or  ceftriaxone. I think I would suspect vancomycin over ceftriaxone given the timing of fevers. She had vancomycin powder placed into the joint as well so if fevers are due to vancomycin they may continue for a period until that has absorbed and flushed out completely.   Alternative she does have a firm area of swelling that is tender to the lateral aspect of leg at mid-incision. ?hematoma (given hgb drop) which could contribute to a fever as well. D/W Dr. Ninfa Linden and her swelling/changes post op all fall in what he would expect to be normal at this time.   Hopeful to discharge home after a little more time with improved temp curve.    PLAN: Continue cefazolin as planned for OP antibiotic therapy She has follow up arranged Follow fever curve-hopeful for improvement and D/C tomorrow  CBC and CRP to be repeated in the afternoon today.     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 The only localizing symptoms she has are some headaches and fatigue. She is wondering why her hemoglobin is so low.    Review of Systems: Review of Systems  Constitutional:  Positive for chills, fever and malaise/fatigue.  HENT:  Negative for tinnitus.   Eyes:  Negative for blurred vision and photophobia.  Respiratory:  Negative for cough and sputum production.   Cardiovascular:  Negative for chest pain.  Gastrointestinal:  Negative for diarrhea, nausea and vomiting.  Genitourinary:  Negative for dysuria.  Skin:  Negative for rash.  Neurological:  Positive for headaches.    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   Adhesive [Tape] Hives and Dermatitis    Can tolerate tegaderm   Latex Hives   Nickel Hives   Augmentin [Amoxicillin-Pot Clavulanate] Diarrhea and Nausea Only    Tolerated cefazolin and ceftriaxone 12/2020   Neomycin-Bacitracin Zn-Polymyx Dermatitis    NEOSPORIN    OBJECTIVE: Vitals:   01/07/21 2009 01/08/21 0722 01/08/21 0825 01/08/21 1029  BP: (!) 119/51 (!) 107/49 Marland Kitchen)  109/38 (!) 106/53  Pulse: 78 84 77 74  Resp: '16 17 16 15  '$ Temp: (!) 101.5 F (38.6 C) (!) 100.5 F (38.1 C) 99.9 F (37.7 C) 99.2 F (37.3 C)  TempSrc: Oral Oral Oral Oral  SpO2: 96% 99% 98%   Weight:      Height:        Body mass index is 39.06 kg/m.   Physical Exam Vitals reviewed.  Constitutional:      Appearance: She is well-developed.     Comments: Resting comfortably in bed  HENT:     Mouth/Throat:     Mouth: No oral lesions.     Dentition: Normal dentition. No dental abscesses.     Pharynx: No oropharyngeal exudate.  Cardiovascular:     Rate and Rhythm: Normal rate and regular rhythm.      Heart sounds: Normal heart sounds.  Pulmonary:     Effort: Pulmonary effort is normal.     Breath sounds: Normal breath sounds.  Abdominal:     General: There is no distension.     Palpations: Abdomen is soft.     Tenderness: There is no abdominal tenderness.  Musculoskeletal:       Legs:     Comments: Swelling - no erythema or fluctuance. +TTP  Lymphadenopathy:     Cervical: No cervical adenopathy.  Skin:    General: Skin is warm and dry.     Findings: No rash.  Neurological:     Mental Status: She is alert and oriented to person, place, and time.  Psychiatric:        Judgment: Judgment normal.  LUE PICC line - clean/dry dressing. Insertion site w/o erythema, tenderness, drainage, cording or distal swelling of affected extremity    Lab Results Lab Results  Component Value Date   WBC 5.8 01/07/2021   HGB 8.4 (L) 01/07/2021   HCT 26.5 (L) 01/07/2021   MCV 96.7 01/07/2021   PLT 281 01/07/2021    Lab Results  Component Value Date   CREATININE 0.65 01/07/2021   BUN 9 01/07/2021   NA 134 (L) 01/07/2021   K 3.4 (L) 01/07/2021   CL 98 01/07/2021   CO2 27 01/07/2021    Lab Results  Component Value Date   ALT 14 01/07/2021   AST 19 01/07/2021   ALKPHOS 82 01/07/2021   BILITOT 0.2 (L) 01/07/2021     Microbiology: Recent Results (from the past 240 hour(s))  SARS Coronavirus 2 (TAT 6-24 hrs)     Status: None   Collection Time: 01/01/21 12:00 AM  Result Value Ref Range Status   SARS Coronavirus 2 RESULT: INVALID  Final    Comment: RESULT: INVALIDInvalid results represent failure of internal amplification control or failure of all targets to yield a valid result. Analysis has been repeated prior to reporting. Findings may represent a PCR inhibitor or other undetermined  specimen-related issue. A new sample should be obtained.Fact Sheet for Healthcare Providers: https://www.woods-mathews.com/.Fact Sheet for Patients:  SugarRoll.be.Normal Reference Range - Negative   SARS Coronavirus 2 by RT PCR (hospital order, performed in [facility] hospital lab) Nasopharyngeal Nasopharyngeal Swab     Status: None   Collection Time: 01/02/21  1:46 PM   Specimen: Nasopharyngeal Swab  Result Value Ref Range Status   SARS Coronavirus 2 NEGATIVE NEGATIVE Final    Comment: (NOTE) SARS-CoV-2 target nucleic acids are NOT DETECTED.  The SARS-CoV-2 RNA is generally detectable in upper and lower respiratory specimens during the acute phase of infection.  The lowest concentration of SARS-CoV-2 viral copies this assay can detect is 250 copies / mL. A negative result does not preclude SARS-CoV-2 infection and should not be used as the sole basis for treatment or other patient management decisions.  A negative result may occur with improper specimen collection / handling, submission of specimen other than nasopharyngeal swab, presence of viral mutation(s) within the areas targeted by this assay, and inadequate number of viral copies (<250 copies / mL). A negative result must be combined with clinical observations, patient history, and epidemiological information.  Fact Sheet for Patients:   StrictlyIdeas.no  Fact Sheet for Healthcare Providers: BankingDealers.co.za  This test is not yet approved or  cleared by the [place] FDA and has been authorized for detection and/or diagnosis of SARS-CoV-2 by FDA under an Emergency Use Authorization (EUA).  This EUA will remain in effect (meaning this test can be used) for the duration of the COVID-19 declaration under Section 564(b)(1) of the Act, 21 U.S.C. section 360bbb-3(b)(1), unless the authorization is terminated or revoked sooner.  Performed at [facility], [place] [address], [place], [place] [address]   Fungus Culture With Stain     Status: None (Preliminary result)   Collection Time:  01/02/21  5:36 PM   Specimen: Soft Tissue, Other  Result Value Ref Range Status   Fungus Stain Final report  Final    Comment: (NOTE) Performed At: [facility] [place], [place] [id number] Rush Farmer MD [id number]    Fungus (Mycology) Culture PENDING  Incomplete   Fungal Source FLUID  Final    Comment: LEFT HIP SWAB SAMPLE A Performed at [facility], [place] [address], [place], [place] [address]   Aerobic/Anaerobic Culture w Gram Stain (surgical/deep wound)     Status: None   Collection Time: 01/02/21  5:36 PM   Specimen: Soft Tissue, Other  Result Value Ref Range Status   Specimen Description FLUID LEFT HIP  Final   Special Requests SWAB SAMPLE A  Final   Gram Stain   Final    ABUNDANT WBC PRESENT, PREDOMINANTLY PMN NO ORGANISMS SEEN    Culture   Final    RARE PROTEUS MIRABILIS NO ANAEROBES ISOLATED Performed at [facility], [place] [address] [place], [place] [address]    Report Status 01/07/2021 FINAL  Final   Organism ID, Bacteria PROTEUS MIRABILIS  Final      Susceptibility   Proteus mirabilis - MIC*    AMPICILLIN <=2 SENSITIVE Sensitive     CEFAZOLIN <=4 SENSITIVE Sensitive     CEFEPIME <=0.12 SENSITIVE Sensitive     CEFTAZIDIME <=1 SENSITIVE Sensitive     CEFTRIAXONE <=0.25 SENSITIVE Sensitive     CIPROFLOXACIN <=0.25 SENSITIVE Sensitive     GENTAMICIN <=1 SENSITIVE Sensitive     IMIPENEM 2 SENSITIVE Sensitive     TRIMETH/SULFA <=20 SENSITIVE Sensitive     AMPICILLIN/SULBACTAM <=2 SENSITIVE Sensitive     PIP/TAZO <=4 SENSITIVE Sensitive     * RARE PROTEUS MIRABILIS  Fungus Culture Result     Status: None   Collection Time: 01/02/21  5:36 PM  Result Value Ref Range Status   Result 1 Comment  Final    Comment: (NOTE) KOH/Calcofluor preparation:  no fungus observed. Performed At: [facility] [place], [place] [id number] Rush Farmer MD [id number]   Fungus Culture With Stain     Status: None  (Preliminary result)   Collection Time: 01/02/21  5:56 PM  Specimen: Soft Tissue, Other  Result Value Ref Range Status   Fungus Stain Final report  Final    Comment: (NOTE) Performed At: [facility] [place], [place] [id number] Rush Farmer MD [id number]    Fungus (Mycology) Culture PENDING  Incomplete   Fungal Source FLUID  Final    Comment: LEFT HIP SWAB Performed at [facility], [place] [address], [place], [place] [address]   Aerobic/Anaerobic Culture w Gram Stain (surgical/deep wound)     Status: None   Collection Time: 01/02/21  5:56 PM   Specimen: Soft Tissue, Other  Result Value Ref Range Status   Specimen Description FLUID LEFT HIP SWAB  Final   Special Requests NONE  Final   Gram Stain   Final    ABUNDANT WBC PRESENT,BOTH PMN AND MONONUCLEAR NO ORGANISMS SEEN    Culture   Final    RARE PROTEUS MIRABILIS NO ANAEROBES ISOLATED Performed at [facility], [place] [address], [place], [place] [address]    Report Status 01/07/2021 FINAL  Final   Organism ID, Bacteria PROTEUS MIRABILIS  Final      Susceptibility   Proteus mirabilis - MIC*    AMPICILLIN <=2 SENSITIVE Sensitive     CEFAZOLIN <=4 SENSITIVE Sensitive     CEFEPIME <=0.12 SENSITIVE Sensitive     CEFTAZIDIME <=1 SENSITIVE Sensitive     CEFTRIAXONE <=0.25 SENSITIVE Sensitive     CIPROFLOXACIN <=0.25 SENSITIVE Sensitive     GENTAMICIN <=1 SENSITIVE Sensitive     IMIPENEM 1 SENSITIVE Sensitive     TRIMETH/SULFA <=20 SENSITIVE Sensitive     AMPICILLIN/SULBACTAM <=2 SENSITIVE Sensitive     PIP/TAZO <=4 SENSITIVE Sensitive     * RARE PROTEUS MIRABILIS  Fungus Culture Result     Status: None   Collection Time: 01/02/21  5:56 PM  Result Value Ref Range Status   Result 1 Comment  Final    Comment: (NOTE) KOH/Calcofluor preparation:  no fungus observed. Performed At: [facility] [place], [place] [id number] Rush Farmer MD [id number]   Culture,  blood (single)     Status: None (Preliminary result)   Collection Time: 01/05/21  6:42 PM   Specimen: BLOOD  Result Value Ref Range Status   Specimen Description BLOOD RIGHT ANTECUBITAL  Final   Special Requests   Final    BOTTLES DRAWN AEROBIC AND ANAEROBIC Blood Culture results may not be optimal due to an inadequate volume of blood received in culture bottles   Culture   Final    NO GROWTH 3 DAYS Performed at [facility], [place] [address] [place], [place] [address]    Report Status PENDING  Incomplete  Culture, blood (routine x 2)     Status: None (Preliminary result)   Collection Time: 01/06/21  4:13 PM   Specimen: BLOOD  Result Value Ref Range Status   Specimen Description BLOOD RIGHT ANTECUBITAL  Final   Special Requests   Final    BOTTLES DRAWN AEROBIC AND ANAEROBIC Blood Culture adequate volume   Culture   Final    NO GROWTH 2 DAYS Performed at [facility], [place] [address] [place], [place] [address]    Report Status PENDING  Incomplete  Culture, blood (routine x 2)     Status: None (Preliminary result)   Collection Time: 01/06/21  4:17 PM   Specimen: BLOOD RIGHT HAND  Result Value Ref Range Status   Specimen Description BLOOD RIGHT HAND  Final   Special Requests   Final    BOTTLES DRAWN AEROBIC AND ANAEROBIC Blood Culture adequate volume   Culture   Final    NO GROWTH 2 DAYS Performed at [facility], [address], [place], [place] [address]    Report Status PENDING  Incomplete    Janene Madeira, MSN, NP-C [facility] [place] Cell: [phone] Pager: [phone]  01/08/2021

## 2021-01-08 NOTE — Plan of Care (Addendum)
Pt., is able to meet her needs on her own with asssistance. Patient had difficulty breathing but O2 is 98. Pt Expressed that she ambulated further than normal and that may be the reason for shortness of breath.  Luvenia Starch, RN

## 2021-01-08 NOTE — Progress Notes (Signed)
Physical Therapy Treatment Patient Details Name: Sharon [name] MRN: [id number] DOB: 1955/07/25 Today's Date: 01/08/2021    History of Present Illness Pt is a 65 y.o. female admitted 01/02/21 with L hip prosthethic joint infection since new prosthesis placed 03/2020. Pt s/p L hip I&D and hip ball exchange on 8/9. PMH includes HTN, OA, DDD, melanoma.    PT Comments    Pt reports not feeling well today, states she feels weak but is agreeable to mobility. Pt with WFL orthostatic vitals (see below), but complains of increased angina and dyspnea during gait (WFL sats 98% on RA). Pt overall requiring supervision level of assist for safety during mobility. PT notified RN of concerns regarding pt dyspnea and chest pain. Will continue to follow while acute.   BP, HR:  - supine:128/49, 76 bpm - sitting: 132/56, 79 bpm - standing 0 min: 129/56, 87 bpm     Follow Up Recommendations  Home health PT;Supervision - Intermittent     Equipment Recommendations  None recommended by PT    Recommendations for Other Services       Precautions / Restrictions Precautions Precautions: Fall Precaution Comments: L hip wound vac d/c this am Restrictions Weight Bearing Restrictions: No LLE Weight Bearing: Weight bearing as tolerated    Mobility  Bed Mobility Overal bed mobility: Needs Assistance Bed Mobility: Supine to Sit;Sit to Supine     Supine to sit: Supervision Sit to supine: Min assist   General bed mobility comments: supine>sit for safety, pt with use of bedrails to perform. min assist for return to supine for LE lifting into bed.    Transfers Overall transfer level: Needs assistance Equipment used: Rolling walker (2 wheeled) Transfers: Sit to/from Stand Sit to Stand: Supervision         General transfer comment: for safety, verbal cuing for hand placement when rising. STS x2, from EOB and BSC.  Ambulation/Gait Ambulation/Gait assistance: Supervision Gait Distance (Feet): 75  Feet Assistive device: Rolling walker (2 wheeled) Gait Pattern/deviations: Step-through pattern;Decreased stride length;Antalgic;Decreased weight shift to left;Trunk flexed Gait velocity: decr   General Gait Details: supervision for safety, verbal cuing for upright posture, placement in RW. Pt with multiple short standing rest breaks to recover dyspnea 2/4 (SpO2 98% on RA when assessed post-gait).   Stairs             Wheelchair Mobility    Modified Rankin (Stroke Patients Only)       Balance Overall balance assessment: Needs assistance Sitting-balance support: Feet supported Sitting balance-Leahy Scale: Good     Standing balance support: Bilateral upper extremity supported;During functional activity Standing balance-Leahy Scale: Poor Standing balance comment: reliant on external support, especially dynamically                            Cognition Arousal/Alertness: Awake/alert Behavior During Therapy: WFL for tasks assessed/performed Overall Cognitive Status: Within Functional Limits for tasks assessed                                        Exercises      General Comments        Pertinent Vitals/Pain Pain Assessment: Faces Faces Pain Scale: Hurts little more Pain Location: L hip Pain Descriptors / Indicators: Discomfort;Sore Pain Intervention(s): Limited activity within patient's tolerance;Monitored during session;Repositioned    Home Living  Prior Function            PT Goals (current goals can now be found in the care plan section) Acute Rehab PT Goals Patient Stated Goal: to go home and have family help PT Goal Formulation: With patient/family Time For Goal Achievement: 01/17/21 Potential to Achieve Goals: Good Progress towards PT goals: Progressing toward goals    Frequency    Min 5X/week      PT Plan Current plan remains appropriate    Co-evaluation              AM-PAC  PT "6 Clicks" Mobility   Outcome Measure  Help needed turning from your back to your side while in a flat bed without using bedrails?: None Help needed moving from lying on your back to sitting on the side of a flat bed without using bedrails?: A Little Help needed moving to and from a bed to a chair (including a wheelchair)?: A Little Help needed standing up from a chair using your arms (e.g., wheelchair or bedside chair)?: A Little Help needed to walk in hospital room?: A Little Help needed climbing 3-5 steps with a railing? : A Little 6 Click Score: 19    End of Session Equipment Utilized During Treatment: Gait belt Activity Tolerance: Patient tolerated treatment well Patient left: in bed;with bed alarm set;with call bell/phone within reach Nurse Communication: Mobility status;Other (comment) (angina, dyspnea on exertion) PT Visit Diagnosis: Unsteadiness on feet (R26.81);Muscle weakness (generalized) (M62.81);Difficulty in walking, not elsewhere classified (R26.2);Pain Pain - Right/Left: Left Pain - part of body: Hip     Time: [id number] PT Time Calculation (min) (ACUTE ONLY): 25 min  Charges:  $Gait Training: 8-22 mins $Therapeutic Activity: 8-22 mins                     Stacie Glaze, PT DPT Acute Rehabilitation Services Pager [phone]  Office [phone]    [place] 01/08/2021, 11:21 AM

## 2021-01-08 NOTE — Progress Notes (Signed)
Patient ID: Sharon [name], female   DOB: 03-Apr-1956, 65 y.o.   MRN: [id number] The patient does report fatigue.  Her blood pressures been just a little soft and her hemoglobin was down to 8.4.  I will order a new CBC for this afternoon and a new CRP.  Hopefully will be able to discharge her to home tomorrow.

## 2021-01-08 NOTE — Progress Notes (Signed)
Patient ID: Sharon [name], female   DOB: [birth year], 64 y.o.   MRN: [id number] There has been no acute changes over the last 24 hours.  I did remove the Praveena incisional VAC at this standpoint because it will have been on for 6 days by today.  There has not been much output from the [facility].  She will likely have some drainage once we remove this though.  The incision itself looks good and the sutures are intact.  I did place an Aquacel dressing over her incision.  There is potential to discharge her to home today by later this afternoon if home health can be set up for the IV antibiotics.  She can continue her mobility while she is here.

## 2021-01-09 MED ORDER — ASPIRIN 81 MG PO TBEC: 81.0000 mg | DELAYED_RELEASE_TABLET | Freq: Two times a day (BID) | ORAL | 0 refills | Status: DC

## 2021-01-09 MED ORDER — HEPARIN SOD (PORK) LOCK FLUSH 100 UNIT/ML IV SOLN
250.0000 [IU] | INTRAVENOUS | Status: AC | PRN
  Administered 2021-01-09: 250 [IU]
  Filled 2021-01-09: qty 2.5

## 2021-01-09 MED ORDER — CEFAZOLIN IV (FOR PTA / DISCHARGE USE ONLY): 2.0000 g | Freq: Three times a day (TID) | INTRAVENOUS | 0 refills | Status: AC

## 2021-01-09 MED ORDER — OXYCODONE HCL 5 MG PO TABS: 5.0000 mg | ORAL_TABLET | Freq: Four times a day (QID) | ORAL | 0 refills | Status: DC | PRN

## 2021-01-09 MED ORDER — METHOCARBAMOL 500 MG PO TABS: 500.0000 mg | ORAL_TABLET | Freq: Four times a day (QID) | ORAL | 1 refills | Status: DC | PRN

## 2021-01-09 NOTE — Progress Notes (Signed)
PT Cancellation Note  Patient Details Name: Sharon [name] MRN: [id number] DOB: 1955/10/29   Cancelled Treatment:    Reason Eval/Treat Not Completed: Other (comment) Pt reports "I am leaving shortly, PT is coming to my house today or tomorrow. I think I am good." PT asked is pt had any questions about home entry or car transfers, pt stated "nope, I've been through this before." PT to return as able if patient remains in hospital to progress mobility.  Kittie Plater, PT, DPT Acute Rehabilitation Services Pager #: [phone] Office #: [phone]    [place] 01/09/2021, 10:05 AM

## 2021-01-09 NOTE — TOC Transition Note (Addendum)
Transition of Care [facility]) - CM/SW Discharge Note   Patient Details  Name: LANAYSHA ORGAN MRN: [id number] Date of Birth: 06-29-1955  Transition of Care [facility]) CM/SW Contact:  Sharin Mons, RN Phone Number: 01/09/2021, 2:39 PM   Clinical Narrative:    Patient will DC to: home Anticipated DC date: 01/09/2021 Family notified: yes Transport by: car   Per MD patient ready for DC today.RN, patient, patient's family, [facility] and [facility] notified of DC.   Pt without Rx MED concerns. Post hospital f/u noted on AVS. Daughter to provide transportation to home.  Kathi Der (Daughter)       [phone]      RNCM will sign off for now as intervention is no longer needed. Please consult [place] again if new needs arise.    Final next level of care: Home w Home Health Services Barriers to Discharge: No Barriers Identified   Patient Goals and CMS Choice     Choice offered to / list presented to : Patient  Discharge Placement                       Discharge Plan and Services   Discharge Planning Services: CM Consult            DME Arranged: Other see comment (IV ABX therapy)   Date DME Agency Contacted: 01/03/21 Time DME Agency Contacted: 1003 Representative spoke with at DME Agency: [facility] Arranged: RN, PT [place] Agency: [place] ([place]) Date [place]: 01/03/21 Time [place]: 1004 Representative spoke with at [place]: [place] ([place]) Interventions     Readmission Risk Interventions No flowsheet data found.

## 2021-01-09 NOTE — Plan of Care (Signed)

## 2021-01-09 NOTE — Progress Notes (Signed)
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  Cultures grew a sensitive Proteus mirabilis and she is currently on cefazolin.  Appears to be doing well over the last day with improvement in her fever curve.  T-max over the last 24 hours 100.7 and plan for discharge today per orthopedic surgery.  Her CBC has been stable and CRP minimally elevated from previous following her surgery.  Baseline ESR 85, CRP 19.6.   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, subsequent encounter    MEDICATIONS:    Scheduled Meds:  amLODipine  10 mg Oral Daily   aspirin EC  81 mg Oral BID   Chlorhexidine Gluconate Cloth  6 each Topical Daily   docusate sodium  100 mg Oral BID   fluticasone  2 spray Each Nare QHS   gabapentin  300 mg Oral BID   metoprolol tartrate  25 mg Oral BID   pantoprazole  40 mg Oral Daily   Continuous Infusions:  sodium chloride 75 mL/hr at 01/04/21 2220    ceFAZolin (ANCEF) IV 2 g (01/09/21 0522)   PRN Meds:.acetaminophen, albuterol, diphenhydrAMINE, HYDROmorphone (DILAUDID) injection, methocarbamol, metoCLOPramide **OR** metoCLOPramide (REGLAN) injection, ondansetron **OR** ondansetron (ZOFRAN) IV, oxyCODONE, oxyCODONE, polyethylene glycol, sodium chloride flush  SUBJECTIVE:   24 hour events:  No acute events  Feeling better today.  Left hip swelling near incision is softer.  She reports no drainage from wound.  Fevers improved.  Looking forward to going home.  Review of Systems  All other systems reviewed and are negative.    OBJECTIVE:   Blood pressure (!) 111/52, pulse 74, temperature 99.8 F (37.7 C), temperature source Oral, resp. rate 18, height 5' (1.524 m), weight 90.7 kg, SpO2 97 %. Body mass index is 39.06 kg/m.  Physical Exam Constitutional:      General: She is not in acute distress.    Appearance: Normal appearance.  HENT:     Head: Normocephalic and atraumatic.  Pulmonary:     Effort: Pulmonary effort is normal. No respiratory distress.  Musculoskeletal:     Comments: Left hip area of firmness is more soft today.  Not tender.   Skin:    General: Skin is warm and dry.     Findings: No rash.     Comments: PICC line in place.   Neurological:     General: No focal deficit present.     Mental Status: She is alert and oriented to person,  place, and time.  Psychiatric:        Mood and Affect: Mood normal.        Behavior: Behavior normal.     Lab Results: Lab Results  Component Value Date   WBC 5.4 01/08/2021   HGB 8.9 (L) 01/08/2021   HCT 27.6 (L) 01/08/2021   MCV 95.5 01/08/2021   PLT 290 01/08/2021    Lab Results  Component Value Date   NA 134 (L) 01/07/2021   K 3.4 (L) 01/07/2021   CO2 27 01/07/2021   GLUCOSE 115 (H) 01/07/2021   BUN 9 01/07/2021   CREATININE 0.65 01/07/2021   CALCIUM 8.0 (L) 01/07/2021   GFRNONAA >60 01/07/2021   GFRAA >60 08/17/2019    Lab Results  Component Value Date   ALT 14 01/07/2021   AST 19 01/07/2021   ALKPHOS 82 01/07/2021   BILITOT 0.2 (L) 01/07/2021       Component Value Date/Time   CRP 19.6 (H) 01/08/2021 1328   CRP 34 (H) 10/13/2020 1540       Component Value Date/Time   ESRSEDRATE 85 (H) 01/03/2021 0220     I have reviewed the micro and lab results in Epic.  Imaging: No results found.   Imaging  independently reviewed in Epic.    [facility] [phone] pager 01/09/2021, 11:27 AM   I spent greater than 25 minutes with the patient including greater than 50% of time in face to face counsel of the patient and in coordination of their care.

## 2021-01-09 NOTE — Plan of Care (Signed)
  Problem: Education: Goal: Knowledge of General Education information will improve Description: Including pain rating scale, medication(s)/side effects and non-pharmacologic comfort measures 01/09/2021 1541 by Damaris Schooner, RN Outcome: Adequate for Discharge 01/09/2021 1540 by Damaris Schooner, RN Outcome: Adequate for Discharge 01/09/2021 0937 by Damaris Schooner, RN Outcome: Progressing   Problem: Health Behavior/Discharge Planning: Goal: Ability to manage health-related needs will improve 01/09/2021 1541 by Damaris Schooner, RN Outcome: Adequate for Discharge 01/09/2021 1540 by Damaris Schooner, RN Outcome: Adequate for Discharge 01/09/2021 [id number] by Damaris Schooner, RN Outcome: Progressing   Problem: Clinical Measurements: Goal: Ability to maintain clinical measurements within normal limits will improve 01/09/2021 1541 by Damaris Schooner, RN Outcome: Adequate for Discharge 01/09/2021 1540 by Damaris Schooner, RN Outcome: Adequate for Discharge 01/09/2021 [id number] by Damaris Schooner, RN Outcome: Progressing Goal: Will remain free from infection 01/09/2021 1541 by Damaris Schooner, RN Outcome: Adequate for Discharge 01/09/2021 1540 by Damaris Schooner, RN Outcome: Adequate for Discharge 01/09/2021 [id number] by Damaris Schooner, RN Outcome: Progressing Goal: Diagnostic test results will improve 01/09/2021 1541 by Damaris Schooner, RN Outcome: Adequate for Discharge 01/09/2021 1540 by Damaris Schooner, RN Outcome: Adequate for Discharge 01/09/2021 [id number] by Damaris Schooner, RN Outcome: Progressing Goal: Respiratory complications will improve 01/09/2021 1541 by Damaris Schooner, RN Outcome: Adequate for Discharge 01/09/2021 1540 by Damaris Schooner, RN Outcome: Adequate for Discharge 01/09/2021 [id number] by Damaris Schooner, RN Outcome: Progressing Goal: Cardiovascular complication will be avoided 01/09/2021 1541 by Damaris Schooner, RN Outcome: Adequate for Discharge 01/09/2021 1540 by Damaris Schooner,  RN Outcome: Adequate for Discharge 01/09/2021 [id number] by Damaris Schooner, RN Outcome: Progressing   Problem: Activity: Goal: Risk for activity intolerance will decrease 01/09/2021 1541 by Damaris Schooner, RN Outcome: Adequate for Discharge 01/09/2021 1540 by Damaris Schooner, RN Outcome: Adequate for Discharge 01/09/2021 [id number] by Damaris Schooner, RN Outcome: Progressing   Problem: Nutrition: Goal: Adequate nutrition will be maintained 01/09/2021 1541 by Damaris Schooner, RN Outcome: Adequate for Discharge 01/09/2021 1540 by Damaris Schooner, RN Outcome: Adequate for Discharge 01/09/2021 0937 by Damaris Schooner, RN Outcome: Progressing   Problem: Coping: Goal: Level of anxiety will decrease 01/09/2021 1541 by Damaris Schooner, RN Outcome: Adequate for Discharge 01/09/2021 1540 by Damaris Schooner, RN Outcome: Adequate for Discharge   Problem: Elimination: Goal: Will not experience complications related to bowel motility 01/09/2021 1541 by Damaris Schooner, RN Outcome: Adequate for Discharge 01/09/2021 1540 by Damaris Schooner, RN Outcome: Adequate for Discharge Goal: Will not experience complications related to urinary retention 01/09/2021 1541 by Damaris Schooner, RN Outcome: Adequate for Discharge 01/09/2021 1540 by Damaris Schooner, RN Outcome: Adequate for Discharge   Problem: Pain Managment: Goal: General experience of comfort will improve 01/09/2021 1541 by Damaris Schooner, RN Outcome: Adequate for Discharge 01/09/2021 1540 by Damaris Schooner, RN Outcome: Adequate for Discharge   Problem: Safety: Goal: Ability to remain free from injury will improve 01/09/2021 1541 by Damaris Schooner, RN Outcome: Adequate for Discharge 01/09/2021 1540 by Damaris Schooner, RN Outcome: Adequate for Discharge   Problem: Skin Integrity: Goal: Risk for impaired skin integrity will decrease 01/09/2021 1541 by Damaris Schooner, RN Outcome: Adequate for Discharge 01/09/2021 1540 by Damaris Schooner,  RN Outcome: Adequate for Discharge

## 2021-01-09 NOTE — Progress Notes (Signed)
Patient ID: Sharon [name], female   DOB: 24-May-1956, 65 y.o.   MRN: [id number] No acute changes.  Left hip stable.  H/H stable.  Can be discharged to home today.

## 2021-01-09 NOTE — Discharge Summary (Signed)
Patient ID: Sharon [name] MRN: [id number] DOB/AGE: [birth year] 66 y.o.  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 inner thight   Carpal tunnel syndrome    bilateral   Chronic allergic rhinitis    Chronic urticaria    Complication of anesthesia    confusion for a couple of days after waking after having multiple knee surgery's, does well with spinal   Degenerative disk disease    Diverticulosis of colon    Dyspnea    Dysrhythmia    February 2022   Fatty liver    Fibrocystic breast disease 1996   GERD (gastroesophageal reflux disease)    History of colon polyps    HYPERPLASTIC 09-14-2015   History of kidney stones    passed on own   History of melanoma excision    left thigh ,inside 2009--- per pt also has had severeal excision of office for almost melanoma   Hypertension    Migraine    since age 9 not as frequent now   Moderate persistent asthma, uncomplicated    followed by dr gallagher (cone allergy and asthma)   OA (osteoarthritis)    all joints   SUI (stress urinary incontinence, female)     Surgeries: Procedure(s): IRRIGATION AND DEBRIDEMENT LEFT HIP WITH HIP BALL AND POLY ETHYLENE LINER EXCHANGE. APPLICATION OF WOUND VAC on 01/02/2021   Consultants:   Discharged Condition: Improved  Hospital Course: Sharon [name] is an 65 y.o. female who was admitted 01/02/2021 for operative treatment of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[place]). Patient has severe unremitting pain that affects sleep, daily activities, and work/hobbies. After pre-op clearance the patient was taken to the operating room on 01/02/2021 and underwent   Procedure(s): IRRIGATION AND DEBRIDEMENT LEFT HIP WITH HIP BALL AND POLY ETHYLENE LINER EXCHANGE. APPLICATION OF WOUND VAC.    Patient was given perioperative antibiotics:  Anti-infectives (From admission, onward)    Start     Dose/Rate Route Frequency Ordered Stop   01/09/21 0000  ceFAZolin (ANCEF) IVPB        2 g Intravenous Every 8 hours 01/09/21 0759 02/18/21 2359   01/07/21 1200  ceFAZolin (ANCEF) IVPB 2g/100 mL premix        2 g 200 mL/hr over 30 Minutes Intravenous Every 8 hours 01/07/21 1005     01/05/21 1100  cefTRIAXone (ROCEPHIN) 2 g in sodium chloride 0.9 % 100 mL IVPB  Status:  Discontinued        2 g 200 mL/hr over 30 Minutes Intravenous Every 24 hours 01/05/21 0956 01/07/21 0918   01/05/21 0000  cefTRIAXone (ROCEPHIN) IVPB  Status:  Discontinued        2 g Intravenous Every 24 hours 01/05/21 1218 01/07/21    01/03/21 0200  vancomycin (VANCOCIN) IVPB 1000 mg/200 mL premix  Status:  Discontinued        1,000 mg 200 mL/hr over 60 Minutes Intravenous Every 12 hours 01/02/21 2100 01/05/21 0956   01/02/21 1830  vancomycin (VANCOCIN) powder  Status:  Discontinued          As needed 01/02/21 1831 01/02/21 1906   01/02/21 1745  vancomycin (VANCOREADY) IVPB 1500 mg/300 mL        1,500 mg 150 mL/hr over 120 Minutes Intravenous  Once 01/02/21 1737 01/02/21 1845        Patient was given sequential compression devices, early ambulation, and chemoprophylaxis to prevent DVT.  Patient benefited maximally from hospital stay and there were no complications.    Recent vital signs: Patient Vitals for the past 24 hrs:  BP Temp Temp src Pulse Resp SpO2  01/09/21 0700 (!) 111/52 99.8 F (37.7 C) Oral 74 18 97 %  01/08/21 2206 (!) 106/38 99.2 F (37.3 C) -- 77 18 98 %  01/08/21 1602 (!) 117/47 (!) 100.7 F (38.2 C) Oral 84 18 96 %  01/08/21 1029 (!) 106/53 99.2 F (37.3 C) Oral 74 15 --  01/08/21 0825 (!) 109/38 99.9 F (37.7 C) Oral 77 16 98 %     Recent laboratory studies:   Recent Labs    01/07/21 0321 01/08/21 1328  WBC 5.8 5.4  HGB 8.4* 8.9*  HCT 26.5* 27.6*  PLT 281 290  NA 134*  --   K 3.4*  --   CL 98  --   CO2 27  --   BUN 9  --   CREATININE 0.65  --   GLUCOSE 115*  --   CALCIUM 8.0*  --      Discharge Medications:   Allergies as of 01/09/2021       Reactions   Other Anaphylaxis   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   Adhesive [tape] Hives, Dermatitis   Can tolerate tegaderm   Latex Hives   Nickel Hives   Augmentin [amoxicillin-pot Clavulanate] Diarrhea, Nausea Only   Tolerated cefazolin and ceftriaxone 12/2020   Neomycin-bacitracin Zn-polymyx Dermatitis   NEOSPORIN        Medication List     STOP taking these medications    HYDROcodone-acetaminophen 10-325 MG tablet Commonly known as: NORCO   nabumetone 750 MG tablet Commonly known as: RELAFEN       TAKE these medications    albuterol 108 (90 Base) MCG/ACT inhaler Commonly known as: VENTOLIN HFA Inhale 2 puffs into the lungs every 4 (four) hours as needed for wheezing or shortness of breath. What changed: Another medication with the same name was changed. Make sure you understand how and when to take each.   albuterol (2.5 MG/3ML) 0.083% nebulizer solution Commonly known as: PROVENTIL USE ONE VIAL IN NEBULIZER EVERY 4 HOURS AS NEEDED FOR WHEEZING What changed:  how much to take how to take this when to take this reasons to take this additional instructions   amLODipine 10 MG tablet Commonly known as: NORVASC Take 1 tablet (10 mg total) by mouth daily.   aspirin 81 MG EC tablet Take 1 tablet (81 mg total) by mouth 2 (two) times daily. Swallow whole.   BARIATRIC MULTIVITAMINS/IRON PO Take 1 tablet by mouth in the morning.   ceFAZolin  IVPB Commonly known as: ANCEF Inject 2 g into the vein every 8 (eight) hours. Indication: Hip wound infection First Dose: Yes Last Day of Therapy: 02/16/2021 Labs - Once weekly:  CBC/D and BMP, Labs - Every  other week:  ESR and CRP Method of administration: IV Push Method of administration may be changed at the discretion of home infusion pharmacist based upon assessment of the patient and/or caregiver's ability to self-administer the medication ordered.   docusate sodium 100 MG capsule Commonly known as: COLACE Take 100 mg by mouth  2 (two) times daily as needed for mild constipation.   fexofenadine 180 MG tablet Commonly known as: ALLEGRA Take 180 mg by mouth in the morning.   fluticasone 50 MCG/ACT nasal spray Commonly known as: FLONASE Place 2 sprays into both nostrils at bedtime.   methocarbamol 500 MG tablet Commonly known as: ROBAXIN Take 1 tablet (500 mg total) by mouth every 6 (six) hours as needed for muscle spasms. What changed: See the new instructions.   metoprolol tartrate 25 MG tablet Commonly known as: LOPRESSOR Take 1 tablet (25 mg total) by mouth 2 (two) times daily.   ondansetron 8 MG disintegrating tablet Commonly known as: ZOFRAN-ODT DISSOLVE 1 TABLET IN MOUTH EVERY 8 HOURS AS NEEDED FOR NAUSEA OR VOMITING What changed:  how much to take how to take this when to take this reasons to take this additional instructions   oxyCODONE 5 MG immediate release tablet Commonly known as: Oxy IR/ROXICODONE Take 1-2 tablets (5-10 mg total) by mouth every 6 (six) hours as needed for moderate pain (pain score 4-6).   pantoprazole 40 MG tablet Commonly known as: PROTONIX TAKE 1 TABLET BY MOUTH EVERY DAY FOR REFLUX What changed: See the new instructions.   SALONPAS LIDOCAINE PLUS EX Place 1 patch onto the skin every 8 (eight) hours as needed (pain.).   Viactiv Calcium Plus D 650-12.5-40 MG-MCG-MCG Chew Generic drug: Calcium-Vitamin D-Vitamin K Chew 1 tablet by mouth in the morning, at noon, and at bedtime.       ASK your doctor about these medications    gabapentin 300 MG capsule Commonly known as: Neurontin Take 1 capsule (300 mg total) by mouth at bedtime.  Can increase to twice daily after 5 days if tolerating well.               Discharge Care Instructions  (From admission, onward)           Start     Ordered   01/09/21 0000  Change dressing on IV access line weekly and PRN  (Home infusion instructions - [facility] )        01/09/21 0759            Diagnostic Studies: DG C-Arm 1-60 Min  Result Date: 01/02/2021 CLINICAL DATA:  Revision of left hip arthroplasty. EXAM: OPERATIVE LEFT HIP (WITH PELVIS IF PERFORMED) TECHNIQUE: Fluoroscopic spot image(s) were submitted for interpretation post-operatively. COMPARISON:  Radiograph yesterday FINDINGS: Single fluoroscopic spot view of the left hip demonstrates left hip arthroplasty in place. Fluoroscopy time 4 seconds. Dose 0.56 mGy. IMPRESSION: Single fluoroscopic spot view of the left hip with arthroplasty in place. Electronically Signed   By: Keith Rake M.D.   On: 01/02/2021 19:12   DG HIP OPERATIVE UNILAT WITH PELVIS LEFT  Result Date: 01/02/2021 CLINICAL DATA:  Revision of left hip arthroplasty. EXAM: OPERATIVE LEFT HIP (WITH PELVIS IF PERFORMED) TECHNIQUE: Fluoroscopic spot image(s) were submitted for interpretation post-operatively. COMPARISON:  Radiograph yesterday FINDINGS: Single fluoroscopic spot view of the left hip demonstrates left hip arthroplasty in place. Fluoroscopy time 4 seconds. Dose 0.56 mGy. IMPRESSION: Single fluoroscopic spot view of the left hip with arthroplasty in place. Electronically Signed   By: Keith Rake M.D.   On: 01/02/2021 19:12   XR HIP UNILAT W OR W/O PELVIS 1V LEFT  Result Date: 01/01/2021 An AP pelvis and lateral left hip shows a well-seated total hip arthroplasty.  There is a slight periosteal reaction at the proximal femur that was not seen on  previous films.  VAS [place] LOWER EXTREMITY VENOUS (DVT)  Result Date: 01/07/2021  Lower Venous DVT Study Patient Name:  Sharon [name]  Date of Exam:   01/07/2021 Medical Rec #:  [id number]          Accession #:    [id number] Date of Birth: [birth year]           Patient Gender: F Patient Age:   59 years Exam Location:  [facility] Procedure:      VAS [place] LOWER EXTREMITY VENOUS (DVT) Referring Phys: Jule Ser --------------------------------------------------------------------------------  Indications: Fever.  Risk Factors: Left prosthetic hip joint infection, post I&D of left hip. Limitations: Body habitus. Comparison Study: Negative right LEV done 07/11/2017 Performing Technologist: Sharion Dove RVS  Examination Guidelines: A complete evaluation includes B-mode imaging, spectral Doppler, color Doppler, and power Doppler as needed of all accessible portions of each vessel. Bilateral testing is considered an integral part of a complete examination. Limited examinations for reoccurring indications may be performed as noted. The reflux portion of the exam is performed with the patient in reverse Trendelenburg.  +---------+---------------+---------+-----------+----------+------------------+ RIGHT    CompressibilityPhasicitySpontaneityPropertiesThrombus Aging     +---------+---------------+---------+-----------+----------+------------------+ CFV      Full                                         pulsatile waveform +---------+---------------+---------+-----------+----------+------------------+ SFJ      Full                                                            +---------+---------------+---------+-----------+----------+------------------+ FV Prox  Full                                                            +---------+---------------+---------+-----------+----------+------------------+ FV Mid   Full                                                            +---------+---------------+---------+-----------+----------+------------------+ FV DistalFull                                                             +---------+---------------+---------+-----------+----------+------------------+ PFV      Full                                                            +---------+---------------+---------+-----------+----------+------------------+ POP      Full  pulsatile waveform +---------+---------------+---------+-----------+----------+------------------+ PTV                                                   patent by color    +---------+---------------+---------+-----------+----------+------------------+ PERO                                                  patent by color    +---------+---------------+---------+-----------+----------+------------------+   +---------+---------------+---------+-----------+----------+-------------------+ LEFT     CompressibilityPhasicitySpontaneityPropertiesThrombus Aging      +---------+---------------+---------+-----------+----------+-------------------+ CFV      Full                                         pulsatile waveform  +---------+---------------+---------+-----------+----------+-------------------+ SFJ      Full                                                             +---------+---------------+---------+-----------+----------+-------------------+ FV Prox  Full                                                             +---------+---------------+---------+-----------+----------+-------------------+ FV Mid   Full                                                             +---------+---------------+---------+-----------+----------+-------------------+ FV DistalFull                                                             +---------+---------------+---------+-----------+----------+-------------------+ PFV      Full                                                             +---------+---------------+---------+-----------+----------+-------------------+ POP       Full                                         pulsatile waveform  +---------+---------------+---------+-----------+----------+-------------------+ PTV  Not well visualized +---------+---------------+---------+-----------+----------+-------------------+ PERO                                                  Not well visualized +---------+---------------+---------+-----------+----------+-------------------+     Summary: BILATERAL: -No evidence of popliteal cyst, bilaterally. RIGHT: - There is no evidence of deep vein thrombosis in the lower extremity. However, portions of this examination were limited- see technologist comments above.  LEFT: - There is no evidence of deep vein thrombosis in the lower extremity. However, portions of this examination were limited- see technologist comments above.  *See table(s) above for measurements and observations. Electronically signed by Harold Barban MD on 01/07/2021 at 3:28:47 PM.    Final    [place] EKG SITE RITE  Result Date: 01/04/2021 If Site Rite image not attached, placement could not be confirmed due to current cardiac rhythm.   Disposition: Discharge disposition: 01-Home or Self Care       Discharge Instructions     [facility] pharmacist to adjust dose for Vancomycin, Aminoglycosides and other anti-infective therapies as requested by physician.   Complete by: As directed    [facility] to provide Cath Flo 625m   Complete by: As directed    Administer for PICC line occlusion and as ordered by physician for other access device issues.   Anaphylaxis Kit: Provided to treat any anaphylactic reaction to the medication being provided to the patient if First Dose or when requested by physician   Complete by: As directed    Epinephrine 121mml vial / amp: Administer 0.25m37m0.25ml14mubcutaneously once for moderate to severe anaphylaxis, nurse to call physician and pharmacy when  reaction occurs and call 911 if needed for immediate care   Diphenhydramine 50mg19mIV vial: Administer 25-50mg 61mM PRN for first dose reaction, rash, itching, mild reaction, nurse to call physician and pharmacy when reaction occurs   Sodium Chloride 0.9% NS 500ml I52mdminister if needed for hypovolemic blood pressure drop or as ordered by physician after call to physician with anaphylactic reaction   Change dressing on IV access line weekly and PRN   Complete by: As directed    Flush IV access with Sodium Chloride 0.9% and Heparin 10 units/ml or 100 units/ml   Complete by: As directed    Home infusion instructions - [facility]   Complete by: As directed    Instructions: Flush IV access with Sodium Chloride 0.9% and Heparin 10units/ml or 100units/ml   Change dressing on IV access line: Weekly and PRN   Instructions Cath Flo 2mg: Ad625mister for PICC Line occlusion and as ordered by physician for other access device   [facility] pharmacist to adjust dose for: Vancomycin, Aminoglycosides and other anti-infective therapies as requested by physician   Method of administration may be changed at the discretion of home infusion pharmacist based upon assessment of the patient and/or caregiver's ability to self-administer the medication ordered   Complete by: As directed           Signed: ChristopMcarthur Rossetti22, 7:59 AM

## 2021-01-09 NOTE — Progress Notes (Signed)
1301 - PICC line education was provided to both pt and daughter at beside by [facility] Provider, Carolynn Sayers. Both daughter and patient have reviewed education video, but only the daughter has verbalized understanding of teaching. Patient opted out of teaching and had her eyes closed as Pam conducted the teaching. Patient's discharge teaching has been provided by this writer, and her belongings have been secured as she waits for a representative from the IV team to de-access her PICC line.   1529 - IV team has visited the patient to de-access line at this time.  Pt was is being d/c with Home Health. No equipments provided.   1540 - Patient has been safely d/c from the unit by Ander Purpura, RN in a wheelchair.

## 2021-01-10 DIAGNOSIS — M150 Primary generalized (osteo)arthritis: Secondary | ICD-10-CM | POA: Diagnosis not present

## 2021-01-10 DIAGNOSIS — T8452XA Infection and inflammatory reaction due to internal left hip prosthesis, initial encounter: Secondary | ICD-10-CM | POA: Diagnosis not present

## 2021-01-10 DIAGNOSIS — Z792 Long term (current) use of antibiotics: Secondary | ICD-10-CM | POA: Diagnosis not present

## 2021-01-10 DIAGNOSIS — G5603 Carpal tunnel syndrome, bilateral upper limbs: Secondary | ICD-10-CM | POA: Diagnosis not present

## 2021-01-10 DIAGNOSIS — N393 Stress incontinence (female) (male): Secondary | ICD-10-CM | POA: Diagnosis not present

## 2021-01-10 DIAGNOSIS — K5730 Diverticulosis of large intestine without perforation or abscess without bleeding: Secondary | ICD-10-CM | POA: Diagnosis not present

## 2021-01-10 DIAGNOSIS — B964 Proteus (mirabilis) (morganii) as the cause of diseases classified elsewhere: Secondary | ICD-10-CM | POA: Diagnosis not present

## 2021-01-10 DIAGNOSIS — Z79899 Other long term (current) drug therapy: Secondary | ICD-10-CM | POA: Diagnosis not present

## 2021-01-10 DIAGNOSIS — Z87891 Personal history of nicotine dependence: Secondary | ICD-10-CM | POA: Diagnosis not present

## 2021-01-10 DIAGNOSIS — K760 Fatty (change of) liver, not elsewhere classified: Secondary | ICD-10-CM | POA: Diagnosis not present

## 2021-01-10 DIAGNOSIS — M519 Unspecified thoracic, thoracolumbar and lumbosacral intervertebral disc disorder: Secondary | ICD-10-CM | POA: Diagnosis not present

## 2021-01-10 DIAGNOSIS — Z85820 Personal history of malignant melanoma of skin: Secondary | ICD-10-CM | POA: Diagnosis not present

## 2021-01-10 DIAGNOSIS — Z9181 History of falling: Secondary | ICD-10-CM | POA: Diagnosis not present

## 2021-01-10 DIAGNOSIS — K635 Polyp of colon: Secondary | ICD-10-CM | POA: Diagnosis not present

## 2021-01-10 DIAGNOSIS — G43909 Migraine, unspecified, not intractable, without status migrainosus: Secondary | ICD-10-CM | POA: Diagnosis not present

## 2021-01-10 DIAGNOSIS — Z452 Encounter for adjustment and management of vascular access device: Secondary | ICD-10-CM | POA: Diagnosis not present

## 2021-01-10 DIAGNOSIS — J4540 Moderate persistent asthma, uncomplicated: Secondary | ICD-10-CM | POA: Diagnosis not present

## 2021-01-10 DIAGNOSIS — Z96652 Presence of left artificial knee joint: Secondary | ICD-10-CM | POA: Diagnosis not present

## 2021-01-10 DIAGNOSIS — I10 Essential (primary) hypertension: Secondary | ICD-10-CM | POA: Diagnosis not present

## 2021-01-10 DIAGNOSIS — K219 Gastro-esophageal reflux disease without esophagitis: Secondary | ICD-10-CM | POA: Diagnosis not present

## 2021-01-10 DIAGNOSIS — Z87442 Personal history of urinary calculi: Secondary | ICD-10-CM | POA: Diagnosis not present

## 2021-01-10 LAB — CULTURE, BLOOD (SINGLE): Culture: NO GROWTH

## 2021-01-10 IMAGING — MG DIGITAL SCREENING BILAT W/ TOMO W/ CAD
8 series · 8 of 24 positions shown · non-contrast
Comparison: Previous exam(s).

CLINICAL DATA: Screening.

EXAM:
DIGITAL SCREENING BILATERAL MAMMOGRAM WITH TOMO AND CAD

[R CC synth-2D]
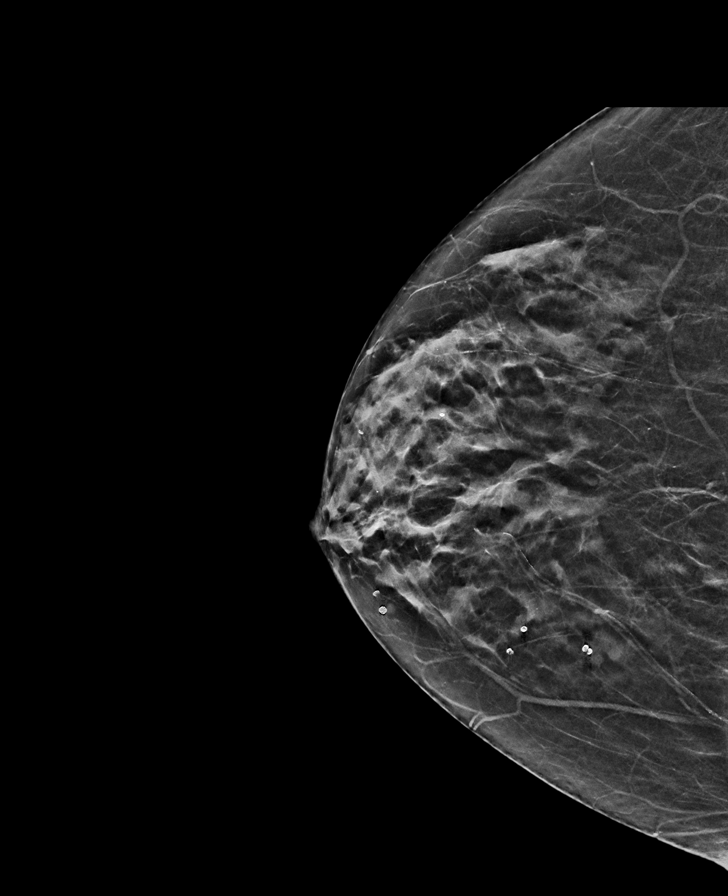

[R MLO synth-2D]
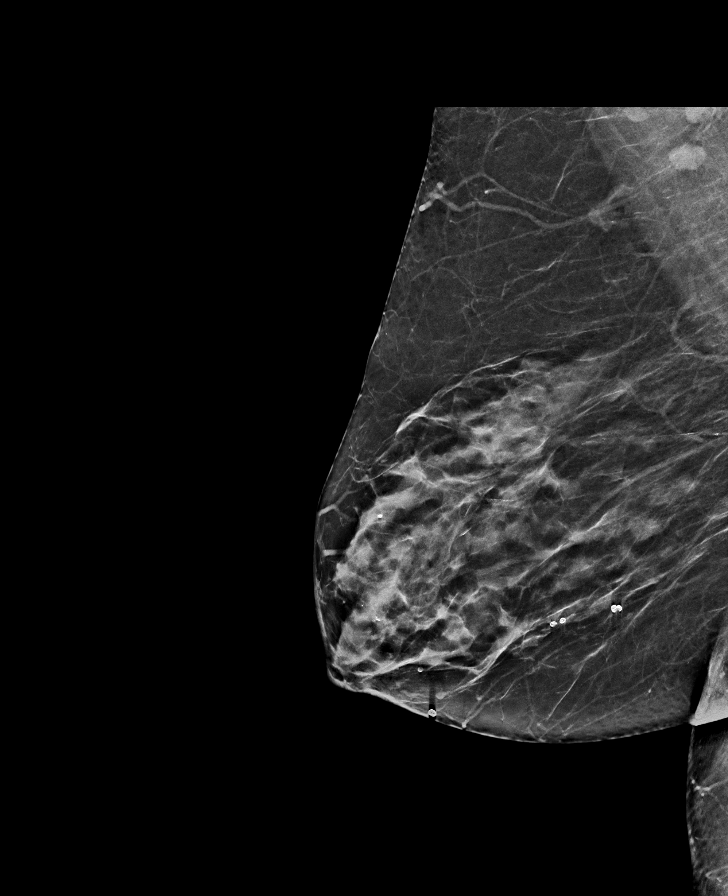

[L CC synth-2D]
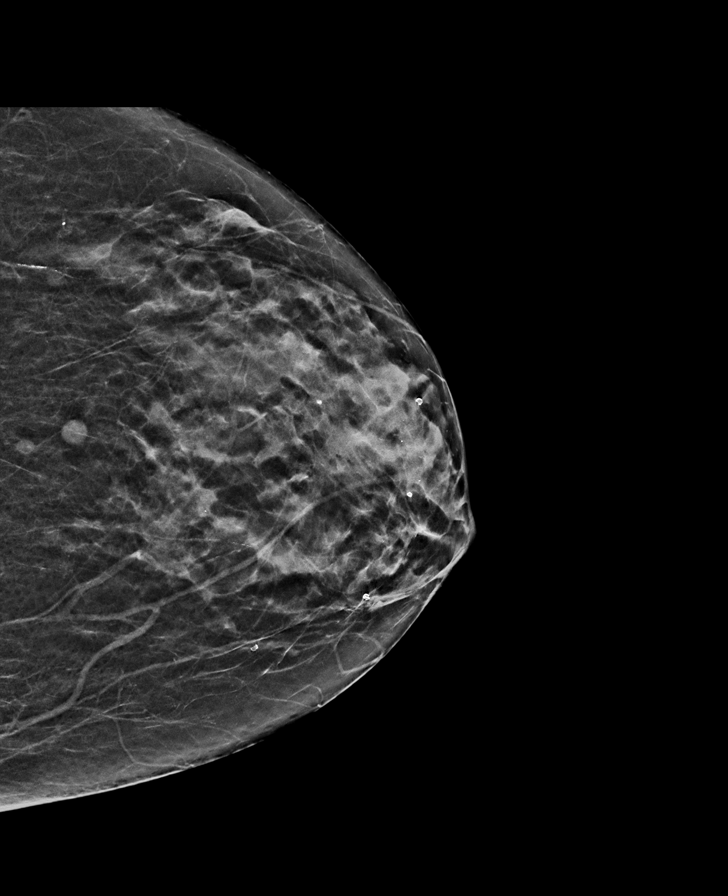

[L MLO synth-2D]
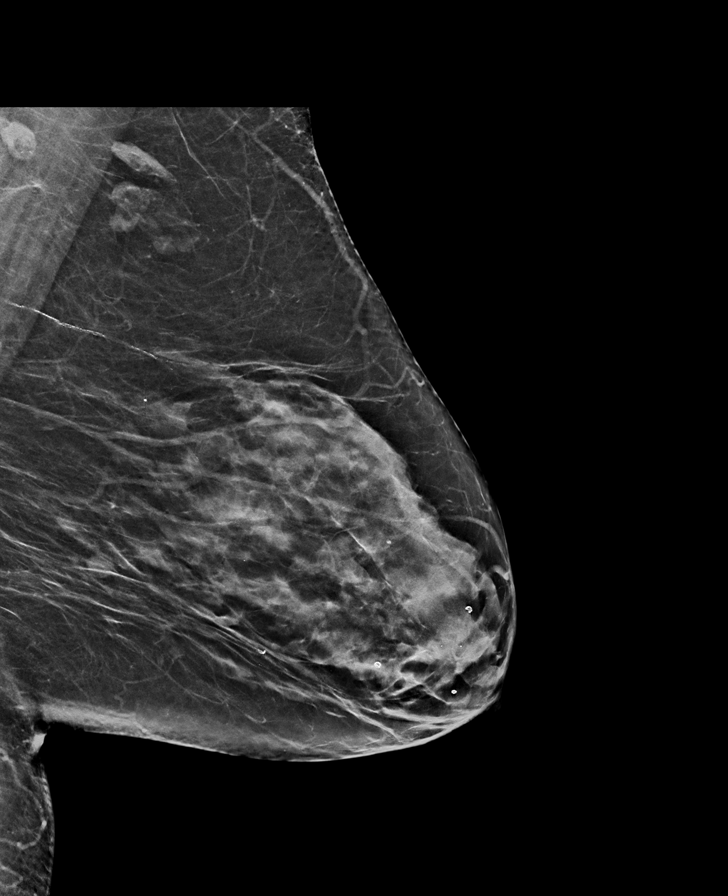

[R MLO tomo · tomo slice 31/62.0]
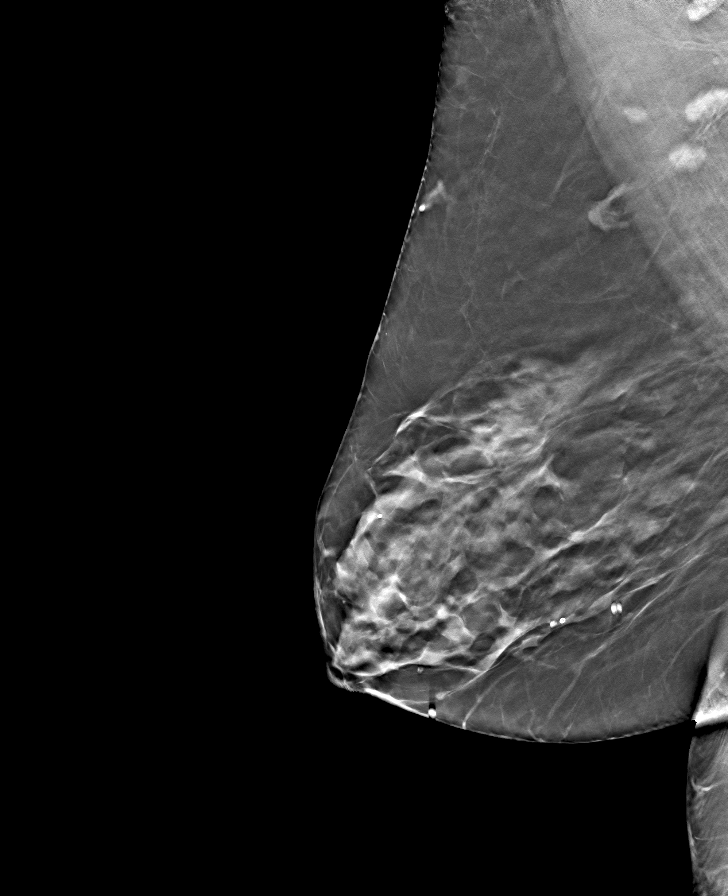

[L CC tomo · tomo slice 25/48.0]
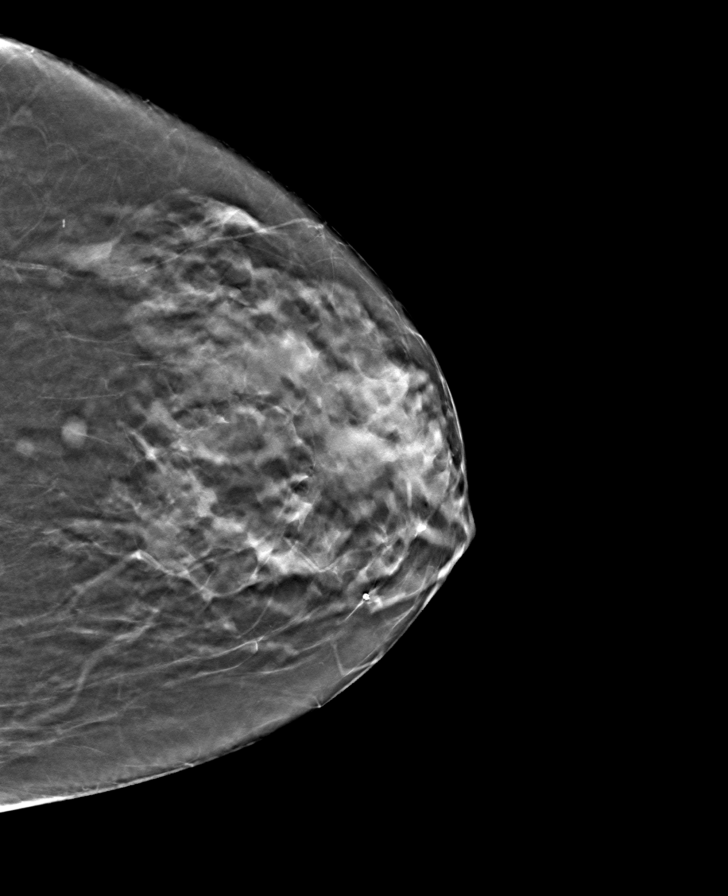

[R CC tomo · tomo slice 25/48.0]
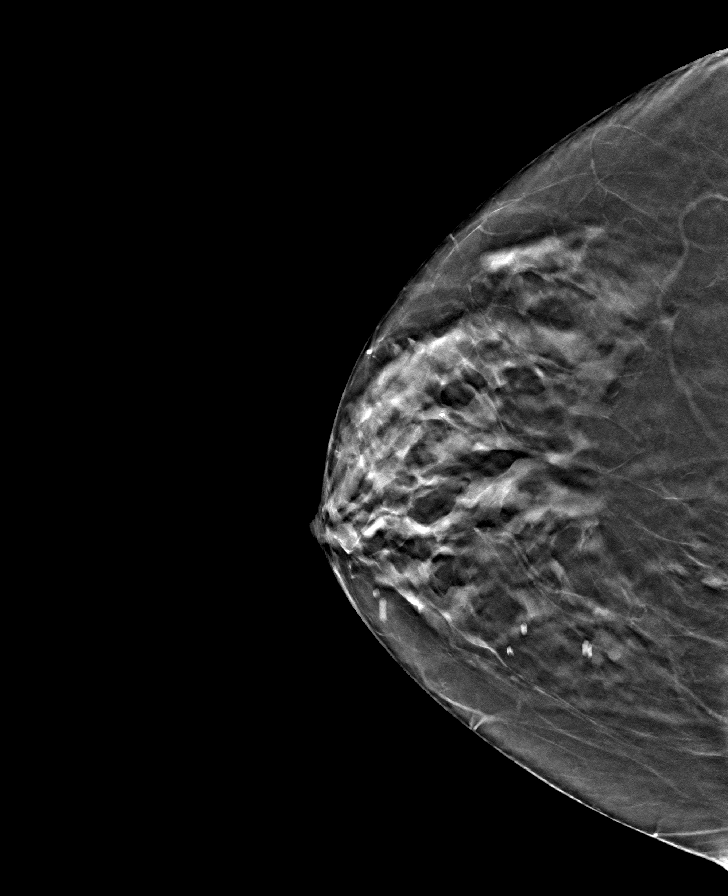

[L MLO tomo · tomo slice 31/62.0]
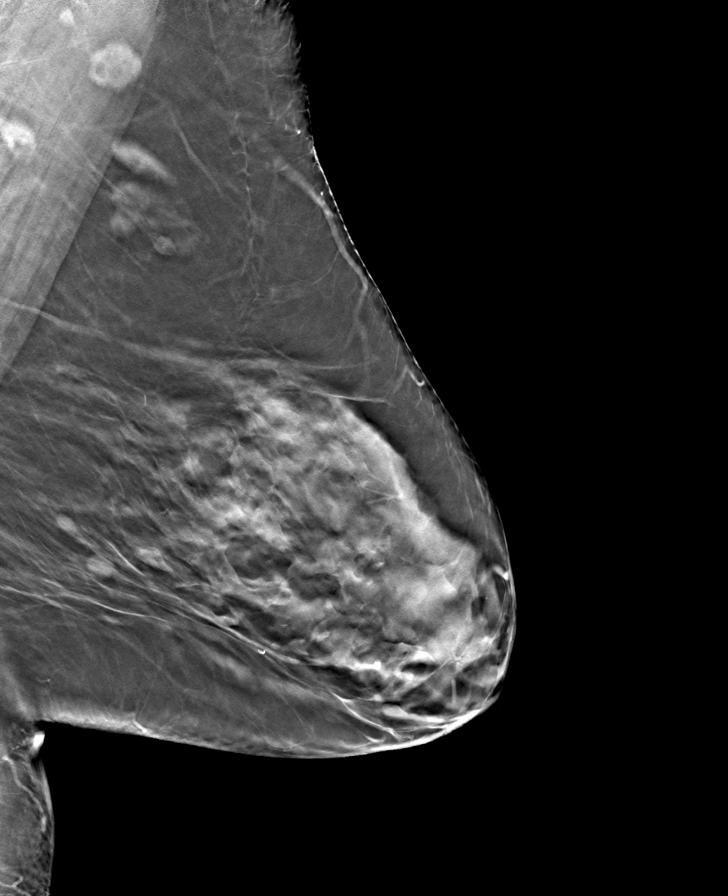

[8 of 24 positions shown; findings below may reference images not displayed]

ACR Breast Density Category c: The breast tissue is heterogeneously
dense, which may obscure small masses.
FINDINGS: In the left breast, two focal asymmetries warrant further
evaluation. In the right breast, no findings suspicious for
malignancy. Images were processed with CAD.
IMPRESSION: Further evaluation is suggested for possible focal asymmetries in
the left breast.

RECOMMENDATION:
Diagnostic mammogram and possibly ultrasound of the left breast.
(Code:IL-J-00V)

The patient will be contacted regarding the findings, and additional
imaging will be scheduled.

BI-RADS CATEGORY  0: Incomplete. Need additional imaging evaluation
and/or prior mammograms for comparison.

## 2021-01-11 LAB — CULTURE, BLOOD (ROUTINE X 2)
Culture: NO GROWTH
Culture: NO GROWTH
Special Requests: ADEQUATE
Special Requests: ADEQUATE

## 2021-01-12 ENCOUNTER — Other Ambulatory Visit: Payer: Self-pay | Admitting: Family Medicine

## 2021-01-12 DIAGNOSIS — I10 Essential (primary) hypertension: Secondary | ICD-10-CM

## 2021-01-15 DIAGNOSIS — I10 Essential (primary) hypertension: Secondary | ICD-10-CM | POA: Diagnosis not present

## 2021-01-17 ENCOUNTER — Encounter: Payer: Self-pay | Admitting: Orthopaedic Surgery

## 2021-01-17 ENCOUNTER — Telehealth: Payer: Self-pay | Admitting: Cardiology

## 2021-01-17 ENCOUNTER — Encounter: Payer: Self-pay | Admitting: Cardiology

## 2021-01-17 ENCOUNTER — Ambulatory Visit (INDEPENDENT_AMBULATORY_CARE_PROVIDER_SITE_OTHER): Payer: PPO | Admitting: Orthopaedic Surgery

## 2021-01-17 DIAGNOSIS — T8452XA Infection and inflammatory reaction due to internal left hip prosthesis, initial encounter: Secondary | ICD-10-CM

## 2021-01-17 NOTE — Telephone Encounter (Signed)
Spoke with pt who states she is concerned that she was to follow up with cardiology after her recent hospitalization and surgery d/t missing her scheduled appointment with Dr Percival Spanish as she was admitted to the hospital.  Pt was advised by scheduling first available appointment is 04/23/2021.  Pt states this is unreasonable as she would also like to discuss her low blood pressure readings in the hospital as well as her Angina.  Pt states she is not having any current symptoms.  Pt does not take her BP daily. Recommended to pt that she monitor BP daily and keep a log of her readings.  Will forward to Dr Percival Spanish and his nurse for review of Dr Hochrein's schedule.  Offered to review pt's results of echo and she declines stating she is late for a her surgical follow up appointment.

## 2021-01-17 NOTE — Telephone Encounter (Signed)
Patient stated that no one has gotten back with her in regards to her test results. Patient is also very frustrated about the pushing back off appt and says that she really needs to see Dr. Percival Spanish do to the changes in her medication. Wants someone to go over results with her even though they was sent to [facility]

## 2021-01-17 NOTE — Progress Notes (Signed)
The patient is 2 weeks status post irrigation debridement of the left total hip infection.  We also change the outer polyliner and hip ball.  She has a PICC line and will be on 6 weeks of IV antibiotics.  She does report increased range of motion and strength and says she is feeling better and doing well.  She is followed by the infectious disease service as well.  Examination of her left hip incision shows no drainage.  I did remove the sutures in place Steri-Strips.  There is no redness either.   I cautioned her that she still may end up getting an infectious process that would require [place] to remove all hardware.  Hopefully that will not be the case.  We will continue to follow her closely.  I would like to see her back in 2 weeks to see how she is doing overall but no x-rays are needed.

## 2021-01-18 NOTE — Progress Notes (Signed)
Cardiology Office Note   Date:  01/19/2021   ID:  Leaner, Morici [birth year], MRN [id number]  PCP:  Erven Colla, DO  Cardiologist:   None Referring:  Erven Colla, DO  Chief Complaint  Patient presents with   Weakness       History of Present Illness: Sharon [name] is a 65 y.o. female who is referred by Erven Colla, DO for evaluation of palpitations.  She is post Covid.  She has not had a past cardiac history.  She is a retired Marketing executive.  She never had a cardiac issue.  She had hip surgery and was slowly recovering from this.  She had been walking a mile earlier in the year.  She then developed COVID.  She was not hospitalized but she had fevers, cough, congestion, sore throat, decreased taste and poor appetite.  Following this she started having elevated blood pressures.  These were noted by her home health physical therapist.  She went up to 210/102 at 1 point in time.  She ultimately was started on amlodipine and her pressures have been in the 388E to 280 systolic.  Prior to this she had never had hypertension.  She also noticed increasing palpitations.  I saw her for this recently.    Since I saw her she had to have revision of the hip because of an infection.  She was actually in the hospital for about 8 days.  I reviewed these records.  She was actually very hypotensive when in the hospital and her blood pressure.  Her meds were not changed.    Since going home she has been getting IV antibiotics 3 times a day.  She says when this is completed she has been to be on oral antibiotics for a year!  She says she is still getting short of breath when lying flat.  Her BNP and echo were normal when I saw her recently.  She feels comfortable lying with her head elevated somewhat.  She is still getting some chest tightness which she described before but this is less than previous and happens when she lies down.  She is doing physical therapy and not bringing on  any symptoms with this.  She is very weak.  She is not having chest pressure, neck or arm discomfort.     Past Medical History:  Diagnosis Date   Anginal pain ([place])    in February 0349   Biliary colic    Cancer ([place])    melanoma: lt. inner thight   Carpal tunnel syndrome    bilateral   Chronic allergic rhinitis    Chronic urticaria    Complication of anesthesia    confusion for a couple of days after waking after having multiple knee surgery's, does well with spinal   Degenerative disk disease    Diverticulosis of colon    Dyspnea    Dysrhythmia    February 2022   Fatty liver    Fibrocystic breast disease 1996   GERD (gastroesophageal reflux disease)    History of colon polyps    HYPERPLASTIC 09-14-2015   History of kidney stones    passed on own   History of melanoma excision    left thigh ,inside 2009--- per pt also has had severeal excision of office for almost melanoma   Hypertension    Migraine    since age 72 not as frequent now   Moderate persistent asthma, uncomplicated  followed by dr gallagher (cone allergy and asthma)   OA (osteoarthritis)    all joints   SUI (stress urinary incontinence, female)     Past Surgical History:  Procedure Laterality Date   ANTERIOR HIP REVISION Left 01/02/2021   Procedure: IRRIGATION AND DEBRIDEMENT LEFT HIP WITH HIP BALL AND POLY ETHYLENE LINER EXCHANGE.;  Surgeon: Mcarthur Rossetti, MD;  Location: [facility];  Service: Orthopedics;  Laterality: Left;  needs RNFA   APPENDECTOMY  3825   APPLICATION OF WOUND VAC Left 01/02/2021   Procedure: APPLICATION OF WOUND VAC;  Surgeon: Mcarthur Rossetti, MD;  Location: [facility];  Service: Orthopedics;  Laterality: Left;   BREAST EXCISIONAL BIOPSY Right 1994;  10-01-2009   2011 per path mild ductal hyperplasia/ fibrocystic breast   BREAST SURGERY     CHOLECYSTECTOMY N/A 01/30/2017   Procedure: LAPAROSCOPIC CHOLECYSTECTOMY;  Surgeon: Kieth Brightly Arta Bruce, MD;  Location: [facility];  Service: General;  Laterality: N/A;   COLONOSCOPY N/A 09/14/2015   Procedure: COLONOSCOPY;  Surgeon: Daneil Dolin, MD;  [facility];  Service: Endoscopy;  Laterality: N/A;  930 - moved to 9:15 - office to notify   ESOPHAGOGASTRODUODENOSCOPY (EGD) WITH PROPOFOL N/A 11/29/2016   Procedure: ESOPHAGOGASTRODUODENOSCOPY (EGD) WITH PROPOFOL;  Surgeon: Doran Stabler, MD;  [facility];  Service: Gastroenterology;  Laterality: N/A;   JOINT REPLACEMENT     knee replacement and left hip replacement   KNEE ARTHROSCOPY W/ MENISCECTOMY Bilateral left 12-19-2006;  right 01-26-2010   LAPAROSCOPIC GASTRIC SLEEVE RESECTION N/A 08/16/2019   Procedure: LAPAROSCOPIC GASTRIC SLEEVE RESECTION, Upper Endo, ERAS Pathway;  Surgeon: Kinsinger, Arta Bruce, MD;  Location: [facility];  Service: General;  Laterality: N/A;   [place] Left 04/14/2020   Procedure: LEFT TOTAL HIP ARTHROPLASTY ANTERIOR APPROACH;  Surgeon: Mcarthur Rossetti, MD;  [facility];  Service: Orthopedics;  Laterality: Left;   TOTAL KNEE ARTHROPLASTY Left 03/10/2007   TOTAL KNEE ARTHROPLASTY  06/17/2011   Procedure: TOTAL KNEE ARTHROPLASTY;  Surgeon: Arther Abbott, MD;  [facility]: [facility];  Service: Orthopedics;  Laterality: Right;  [place]   TOTAL KNEE REVISION Left 07/08/2010  (spinal anesthesia)   TUBAL LIGATION Bilateral YRS AGO   VAGINAL HYSTERECTOMY  1988     Current Outpatient Medications  Medication Sig Dispense Refill   albuterol (PROVENTIL HFA;VENTOLIN HFA) 108 (90 Base) MCG/ACT inhaler Inhale 2 puffs into the lungs every 4 (four) hours as needed for wheezing or shortness of breath. 1 Inhaler 2   albuterol (PROVENTIL) (2.5 MG/3ML) 0.083% nebulizer solution USE ONE VIAL IN NEBULIZER EVERY 4 HOURS AS NEEDED FOR WHEEZING (Patient taking differently: Take 2.5 mg by nebulization every 4 (four) hours as needed for wheezing.) 50 vial 2   aspirin EC 81 MG EC  tablet Take 1 tablet (81 mg total) by mouth 2 (two) times daily. Swallow whole. 30 tablet 0   Calcium-Vitamin D-Vitamin K (VIACTIV CALCIUM PLUS D) 650-12.5-40 MG-MCG-MCG CHEW Chew 1 tablet by mouth in the morning, at noon, and at bedtime.     ceFAZolin (ANCEF) IVPB Inject 2 g into the vein every 8 (eight) hours. Indication: Hip wound infection First Dose: Yes Last Day of Therapy: 02/16/2021 Labs - Once weekly:  CBC/D and BMP, Labs - Every other week:  ESR and CRP Method of administration: IV Push Method of administration may be changed at the discretion of home infusion pharmacist based upon assessment of the patient and/or caregiver's  ability to self-administer the medication ordered. 120 Units 0   docusate sodium (COLACE) 100 MG capsule Take 100 mg by mouth 2 (two) times daily as needed for mild constipation.     fexofenadine (ALLEGRA) 180 MG tablet Take 180 mg by mouth in the morning.     fluticasone (FLONASE) 50 MCG/ACT nasal spray Place 2 sprays into both nostrils at bedtime.     gabapentin (NEURONTIN) 300 MG capsule Take 1 capsule (300 mg total) by mouth at bedtime. Can increase to twice daily after 5 days if tolerating well. (Patient taking differently: Take 300 mg by mouth 2 (two) times daily.) 60 capsule 1   Lidocaine HCl-Benzyl Alcohol (SALONPAS LIDOCAINE PLUS EX) Place 1 patch onto the skin every 8 (eight) hours as needed (pain.).     methocarbamol (ROBAXIN) 500 MG tablet Take 1 tablet (500 mg total) by mouth every 6 (six) hours as needed for muscle spasms. 40 tablet 1   metoprolol tartrate (LOPRESSOR) 25 MG tablet Take 1 tablet (25 mg total) by mouth 2 (two) times daily. 60 tablet 3   Multiple Vitamins-Minerals (BARIATRIC MULTIVITAMINS/IRON PO) Take 1 tablet by mouth in the morning.     ondansetron (ZOFRAN-ODT) 8 MG disintegrating tablet DISSOLVE 1 TABLET IN MOUTH EVERY 8 HOURS AS NEEDED FOR NAUSEA OR VOMITING (Patient taking differently: Take 8 mg by mouth every 6 (six) hours as needed  for vomiting or nausea.) 30 tablet 1   pantoprazole (PROTONIX) 40 MG tablet TAKE 1 TABLET BY MOUTH EVERY DAY FOR REFLUX 90 tablet 0   HYDROcodone-acetaminophen (NORCO) 10-325 MG tablet Take 1 tablet by mouth 3 (three) times daily as needed.     oxyCODONE (OXY IR/ROXICODONE) 5 MG immediate release tablet Take 1-2 tablets (5-10 mg total) by mouth every 6 (six) hours as needed for moderate pain (pain score 4-6). (Patient not taking: Reported on 01/19/2021) 30 tablet 0   No current facility-administered medications for this visit.    Allergies:   Other, Adhesive [tape], Latex, Nickel, Augmentin [amoxicillin-pot clavulanate], and Neomycin-bacitracin zn-polymyx    ROS:  Please see the history of present illness.   Otherwise, review of systems are positive for none.   All other systems are reviewed and negative.    PHYSICAL EXAM: VS:  BP 109/69   Pulse 79   Ht 5' (1.524 m)   Wt 220 lb (99.8 kg)   SpO2 98%   BMI 42.97 kg/m  , BMI Body mass index is 42.97 kg/m. GEN:  No distress NECK:  No jugular venous distention at 90 degrees, waveform within normal limits, carotid upstroke brisk and symmetric, no bruits, no thyromegaly LYMPHATICS:  No cervical adenopathy LUNGS:  Clear to auscultation bilaterally BACK:  No CVA tenderness CHEST:  Unremarkable HEART:  S1 and S2 within normal limits, no S3, no S4, no clicks, no rubs, no murmurs ABD:  Positive bowel sounds normal in frequency in pitch, no bruits, no rebound, no guarding, unable to assess midline mass or bruit with the patient seated. EXT:  2 plus pulses throughout, mild left leg edema, no cyanosis no clubbing SKIN:  No rashes no nodules NEURO:  Cranial nerves II through XII grossly intact, motor grossly intact throughout PSYCH:  Cognitively intact, oriented to person place and time   EKG:  EKG is not ordered today.    Recent Labs: 08/29/2020: TSH 3.100 10/13/2020: BNP 38.7 01/07/2021: ALT 14; BUN 9; Creatinine, Ser 0.65; Potassium 3.4;  Sodium 134 01/08/2021: Hemoglobin 8.9; Platelets 290    Lipid  Panel    Component Value Date/Time   CHOL 162 03/05/2016 1041   CHOL 182 10/27/2014 0846   TRIG 126 03/05/2016 1041   HDL 58 03/05/2016 1041   HDL 76 10/27/2014 0846   CHOLHDL 2.8 03/05/2016 1041   VLDL 25 03/05/2016 1041   LDLCALC 79 03/05/2016 1041   LDLCALC 84 10/27/2014 0846      Wt Readings from Last 3 Encounters:  01/19/21 220 lb (99.8 kg)  01/02/21 200 lb (90.7 kg)  10/13/20 213 lb 3.2 oz (96.7 kg)      Other studies Reviewed: Additional studies/ records that were reviewed today include: Hospital records. . Review of the above records demonstrates:  Please see elsewhere in the note.     ASSESSMENT AND PLAN:  Palpitations:    She had SVT on a monitor and this is much improved with low-dose beta-blocker.  No change in therapy.   SOB   BNP was normal.  Echo was normal.  No change in therapy.  Her shortness of breath may be related to weakness and deconditioning.  I do not think at this point it is heart failure or an anginal equivalent.   HTN: Her blood pressure is running low since her surgery and we will get rid of the amlodipine.  She will keep an eye on her blood pressure.    Chest pain: This has nonanginal more than anginal qualities.  It seems to be improved.  She and I agree to conservative management and no further testing at this point given her recent events.  Current medicines are reviewed at length with the patient today.  The patient does not have concerns regarding medicines.  The following changes have been made: As above  Labs/ tests ordered today include: None  No orders of the defined types were placed in this encounter.    Disposition:   FU with me in 3 months   Signed, Minus Breeding, MD  01/19/2021 4:41 PM    [facility]

## 2021-01-18 NOTE — Telephone Encounter (Signed)
Attempted to reach by phone, but no answer.  Sent mychart message advising she would get a call to schedule a sooner appt with Dr. Percival Spanish.

## 2021-01-18 NOTE — Telephone Encounter (Signed)
Called patient left message on personal voice mail Dr.Hochrein has a cancellation for tomorrow 8/26.Appointment scheduled at 3:40 pm.Advised to call back if you cannot keep appointment.

## 2021-01-19 ENCOUNTER — Ambulatory Visit: Payer: PPO | Admitting: Cardiology

## 2021-01-19 ENCOUNTER — Encounter: Payer: Self-pay | Admitting: Cardiology

## 2021-01-19 ENCOUNTER — Other Ambulatory Visit: Payer: Self-pay

## 2021-01-19 VITALS — BP 109/69 | HR 79 | Ht 60.0 in | Wt 220.0 lb

## 2021-01-19 DIAGNOSIS — G894 Chronic pain syndrome: Secondary | ICD-10-CM | POA: Diagnosis not present

## 2021-01-19 DIAGNOSIS — I10 Essential (primary) hypertension: Secondary | ICD-10-CM

## 2021-01-19 DIAGNOSIS — Z6841 Body Mass Index (BMI) 40.0 and over, adult: Secondary | ICD-10-CM | POA: Diagnosis not present

## 2021-01-19 DIAGNOSIS — M25552 Pain in left hip: Secondary | ICD-10-CM | POA: Diagnosis not present

## 2021-01-19 DIAGNOSIS — M009 Pyogenic arthritis, unspecified: Secondary | ICD-10-CM | POA: Diagnosis not present

## 2021-01-19 DIAGNOSIS — R002 Palpitations: Secondary | ICD-10-CM

## 2021-01-19 DIAGNOSIS — Z79899 Other long term (current) drug therapy: Secondary | ICD-10-CM | POA: Diagnosis not present

## 2021-01-19 NOTE — Patient Instructions (Signed)
Medication Instructions:  STOP amlodipine  *If you need a refill on your cardiac medications before your next appointment, please call your pharmacy*   Follow-Up: At [facility], you and your health needs are our priority.  As part of our continuing mission to provide you with exceptional heart care, we have created designated Provider Care Teams.  These Care Teams include your primary Cardiologist (physician) and Advanced Practice Providers (APPs -  Physician Assistants and Nurse Practitioners) who all work together to provide you with the care you need, when you need it.  We recommend signing up for the patient portal called "MyChart".  Sign up information is provided on this After Visit Summary.  MyChart is used to connect with patients for Virtual Visits (Telemedicine).  Patients are able to view lab/test results, encounter notes, upcoming appointments, etc.  Non-urgent messages can be sent to your provider as well.   To learn more about what you can do with MyChart, go to [email or web address].    Your next appointment:   3 month(s)  The format for your next appointment:   In Person  Provider:   You may see Dr. Percival Spanish or one of the following Advanced Practice Providers on your designated Care Team:   Rosaria Ferries, PA-C Caron Presume, PA-C Jory Sims, DNP, ANP   Other Instructions

## 2021-01-20 ENCOUNTER — Other Ambulatory Visit: Payer: Self-pay | Admitting: Orthopaedic Surgery

## 2021-01-22 DIAGNOSIS — T8452XA Infection and inflammatory reaction due to internal left hip prosthesis, initial encounter: Secondary | ICD-10-CM | POA: Diagnosis not present

## 2021-01-22 DIAGNOSIS — B964 Proteus (mirabilis) (morganii) as the cause of diseases classified elsewhere: Secondary | ICD-10-CM | POA: Diagnosis not present

## 2021-01-23 ENCOUNTER — Other Ambulatory Visit: Payer: Self-pay

## 2021-01-23 ENCOUNTER — Telehealth (INDEPENDENT_AMBULATORY_CARE_PROVIDER_SITE_OTHER): Payer: PPO | Admitting: Infectious Diseases

## 2021-01-23 DIAGNOSIS — Z5181 Encounter for therapeutic drug level monitoring: Secondary | ICD-10-CM

## 2021-01-23 DIAGNOSIS — R509 Fever, unspecified: Secondary | ICD-10-CM

## 2021-01-23 DIAGNOSIS — T8452XD Infection and inflammatory reaction due to internal left hip prosthesis, subsequent encounter: Secondary | ICD-10-CM | POA: Diagnosis not present

## 2021-01-23 NOTE — Progress Notes (Signed)
Patient: Sharon [name]  DOB: 1955/06/07 MRN: [id number] PCP: Erven Colla, DO   VIRTUAL CARE ENCOUNTER  I connected with Sharon [name] on 01/24/21 at  3:30 PM EDT by VIDEO and verified that I am speaking with the correct person using two identifiers.   I discussed the limitations, risks, security and privacy concerns of performing an evaluation and management service by telephone and the availability of in person appointments. I also discussed with the patient that there may be a patient responsible charge related to this service. The patient expressed understanding and agreed to proceed.  Patient Location: [place] residence   Other Participants: none  Provider Location: [facility] Office   Subjective:   Chief Complaint  Patient presents with   Hospitalization Follow-up     HPI: EVAN OSBURN is a 65 y.o. 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 PICC placed and D/C'd on cefazolin infusions TID. Post op hospital stay was prolonged after she developed high fevers in the absence of any localizing symptoms or leukocytosis.   She tells me she did experience some fevers at return home but they eventually decreased in duration/frequency, now resolved entirely as of the last 5 days. She feels well and doing great with IV antibiotics with the help of her daughter. She has had several OV follow ups with cardiology, pain clinic and Dr. Ninfa Linden which was a little challenging for her to get out and about for that.  Her incision is healing nicely and she has no concerns over the site today. Continues to work with Home PT and reports the ROM is improved vs prior to surgery.   PICC line is without pain, drainage or erythema and is well maintained by [facility] Team. No swelling or altered sensation in affected distal extremity.    Review of Systems  Constitutional:   Negative for chills and fever.  HENT:  Negative for tinnitus.   Eyes:  Negative for blurred vision and photophobia.  Respiratory:  Negative for cough and sputum production.   Cardiovascular:  Negative for chest pain.  Gastrointestinal:  Negative for diarrhea, nausea and vomiting.  Genitourinary:  Negative for dysuria.  Musculoskeletal:  Positive for joint pain.  Skin:  Negative for rash.  Neurological:  Positive for weakness. Negative for headaches.   Past Medical History:  Diagnosis Date   Anginal pain ([place])    in February 0160   Biliary colic    Cancer ([place])    melanoma: lt. inner thight   Carpal tunnel syndrome    bilateral   Chronic allergic rhinitis    Chronic urticaria    Complication of anesthesia    confusion for a couple of days after waking after having multiple knee surgery's, does well with spinal   Degenerative disk disease    Diverticulosis of colon    Dyspnea    Dysrhythmia    February 2022   Fatty liver    Fibrocystic breast disease 1996   GERD (gastroesophageal reflux disease)    History of colon polyps    HYPERPLASTIC 09-14-2015   History of kidney stones    passed on own   History of melanoma excision    left thigh ,inside 2009--- per pt also has had severeal excision of office for almost melanoma   Hypertension    Migraine    since age 19 not as frequent now   Moderate persistent  asthma, uncomplicated    followed by dr gallagher (cone allergy and asthma)   OA (osteoarthritis)    all joints   SUI (stress urinary incontinence, female)     Outpatient Medications Prior to Visit  Medication Sig Dispense Refill   albuterol (PROVENTIL HFA;VENTOLIN HFA) 108 (90 Base) MCG/ACT inhaler Inhale 2 puffs into the lungs every 4 (four) hours as needed for wheezing or shortness of breath. 1 Inhaler 2   albuterol (PROVENTIL) (2.5 MG/3ML) 0.083% nebulizer solution USE ONE VIAL IN NEBULIZER EVERY 4 HOURS AS NEEDED FOR WHEEZING (Patient taking differently: Take 2.5 mg  by nebulization every 4 (four) hours as needed for wheezing.) 50 vial 2   aspirin EC 81 MG EC tablet Take 1 tablet (81 mg total) by mouth 2 (two) times daily. Swallow whole. 30 tablet 0   Calcium-Vitamin D-Vitamin K (VIACTIV CALCIUM PLUS D) 650-12.5-40 MG-MCG-MCG CHEW Chew 1 tablet by mouth in the morning, at noon, and at bedtime.     ceFAZolin (ANCEF) IVPB Inject 2 g into the vein every 8 (eight) hours. Indication: Hip wound infection First Dose: Yes Last Day of Therapy: 02/16/2021 Labs - Once weekly:  CBC/D and BMP, Labs - Every other week:  ESR and CRP Method of administration: IV Push Method of administration may be changed at the discretion of home infusion pharmacist based upon assessment of the patient and/or caregiver's ability to self-administer the medication ordered. 120 Units 0   docusate sodium (COLACE) 100 MG capsule Take 100 mg by mouth 2 (two) times daily as needed for mild constipation.     fexofenadine (ALLEGRA) 180 MG tablet Take 180 mg by mouth in the morning.     fluticasone (FLONASE) 50 MCG/ACT nasal spray Place 2 sprays into both nostrils at bedtime.     gabapentin (NEURONTIN) 300 MG capsule Take 1 capsule (300 mg total) by mouth 2 (two) times daily. 60 capsule 3   HYDROcodone-acetaminophen (NORCO) 10-325 MG tablet Take 1 tablet by mouth 3 (three) times daily as needed.     Lidocaine HCl-Benzyl Alcohol (SALONPAS LIDOCAINE PLUS EX) Place 1 patch onto the skin every 8 (eight) hours as needed (pain.).     methocarbamol (ROBAXIN) 500 MG tablet Take 1 tablet (500 mg total) by mouth every 6 (six) hours as needed for muscle spasms. 40 tablet 1   metoprolol tartrate (LOPRESSOR) 25 MG tablet Take 1 tablet (25 mg total) by mouth 2 (two) times daily. 60 tablet 3   Multiple Vitamins-Minerals (BARIATRIC MULTIVITAMINS/IRON PO) Take 1 tablet by mouth in the morning.     ondansetron (ZOFRAN-ODT) 8 MG disintegrating tablet DISSOLVE 1 TABLET IN MOUTH EVERY 8 HOURS AS NEEDED FOR NAUSEA OR  VOMITING (Patient taking differently: Take 8 mg by mouth every 6 (six) hours as needed for vomiting or nausea.) 30 tablet 1   oxyCODONE (OXY IR/ROXICODONE) 5 MG immediate release tablet Take 1-2 tablets (5-10 mg total) by mouth every 6 (six) hours as needed for moderate pain (pain score 4-6). (Patient not taking: Reported on 01/19/2021) 30 tablet 0   pantoprazole (PROTONIX) 40 MG tablet TAKE 1 TABLET BY MOUTH EVERY DAY FOR REFLUX 90 tablet 0   No facility-administered medications prior to visit.     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    Augmentin [Amoxicillin-Pot Clavulanate] Diarrhea and Nausea Only    Tolerated cefazolin and ceftriaxone 12/2020   Neomycin-Bacitracin Zn-Polymyx Dermatitis    NEOSPORIN    Social History   Tobacco Use   Smoking status: Former    Packs/day: 1.00    Years: 25.00    Pack years: 25.00    Types: Cigarettes    Start date: 08/02/1972    Quit date: 05/27/2006    Years since quitting: 14.6   Smokeless tobacco: Never  Vaping Use   Vaping Use: Never used  Substance Use Topics   Alcohol use: Yes    Alcohol/week: 0.0 standard drinks    Comment: occas.   Drug use: No    Family History  Problem Relation Age of Onset   Hypertension Father    Lung cancer Father 13       lung cancer   Lung disease Other        family history    Allergic rhinitis Daughter    Food Allergy Grandchild    Anesthesia problems Neg Hx    Hypotension Neg Hx    Malignant hyperthermia Neg Hx    Pseudochol deficiency Neg Hx    Colon cancer Neg Hx    Angioedema Neg Hx    Asthma Neg Hx    Eczema Neg Hx    Immunodeficiency Neg Hx    Urticaria Neg Hx     Objective:  There were no vitals filed for this visit. There is no  height or weight on file to calculate BMI.  Physical Exam Vitals reviewed.  Constitutional:      Appearance: Normal appearance. She is not ill-appearing.  HENT:     Mouth/Throat:     Mouth: Mucous membranes are moist.     Pharynx: Oropharynx is clear.  Eyes:     General: No scleral icterus. Pulmonary:     Effort: Pulmonary effort is normal.  Neurological:     Mental Status: She is oriented to person, place, and time.  Psychiatric:        Mood and Affect: Mood normal.        Thought Content: Thought content normal.    Lab Results: Lab Results  Component Value Date   WBC 5.4 01/08/2021   HGB 8.9 (L) 01/08/2021   HCT 27.6 (L) 01/08/2021   MCV 95.5 01/08/2021   PLT 290 01/08/2021    Lab Results  Component Value Date   CREATININE 0.65 01/07/2021   BUN 9 01/07/2021   NA 134 (L) 01/07/2021   K 3.4 (L) 01/07/2021   CL 98 01/07/2021   CO2 27 01/07/2021    Lab Results  Component Value Date   ALT 14 01/07/2021   AST 19 01/07/2021   ALKPHOS 82 01/07/2021   BILITOT 0.2 (L) 01/07/2021     Assessment & Plan:   Problem List Items Addressed This Visit       Unprioritized   Medication monitoring encounter    All OPAT lab work reviewed since D/C. Inflammatory markers trending down. OPAT end date 02/16/2021. Continue current cefazolin 2 g IV Q8h.   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 Mobility is improving and incision reported to be healing well and free from infection. She will continue follow up with Dr Ninfa Linden closely and has an appt to see Dr. Juleen China again in 3 weeks. We talked about typical course of treatment for infected hip - will prepare for conversion  to oral therapy at next OV to continue 3 months.       RESOLVED: Fever    Work up in the hospital following surgery negative. Most likely this was related to vancomycin - she continue to have some fevers at home up until about 5 days ago. These likely were prolonged given she had local placement of vancomycin into the hip joint as well making wash out timeline uncertain for the drug.  I am glad she is doing well today.        Janene Madeira, MSN, NP-C [facility] Pager: [phone] Office: [phone]  01/24/21  10:12 AM

## 2021-01-24 ENCOUNTER — Encounter: Payer: Self-pay | Admitting: Infectious Diseases

## 2021-01-24 DIAGNOSIS — Z5181 Encounter for therapeutic drug level monitoring: Secondary | ICD-10-CM | POA: Insufficient documentation

## 2021-01-24 DIAGNOSIS — R509 Fever, unspecified: Secondary | ICD-10-CM | POA: Insufficient documentation

## 2021-01-24 NOTE — Assessment & Plan Note (Signed)
All OPAT lab work reviewed since D/C. Inflammatory markers trending down. OPAT end date 02/16/2021. Continue current cefazolin 2 g IV Q8h.   8/10: ESR 85, CRP 196 mg/L  8/22: WBC 8.9, SCr 0.53, ESR 58, CRP 119 mg/L  8/29:  WBC 6.2, SCr 0.49, ESR 64, CRP 43 mg/L

## 2021-01-24 NOTE — Assessment & Plan Note (Signed)
Work up in the hospital following surgery negative. Most likely this was related to vancomycin - she continue to have some fevers at home up until about 5 days ago. These likely were prolonged given she had local placement of vancomycin into the hip joint as well making wash out timeline uncertain for the drug.  I am glad she is doing well today.

## 2021-01-24 NOTE — Assessment & Plan Note (Signed)
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 Mobility is improving and incision reported to be healing well and free from infection. She will continue follow up with Dr Ninfa Linden closely and has an appt to see Dr. Juleen China again in 3 weeks. We talked about typical course of treatment for infected hip - will prepare for conversion to oral therapy at next OV to continue 3 months.

## 2021-01-26 DIAGNOSIS — Z87891 Personal history of nicotine dependence: Secondary | ICD-10-CM | POA: Diagnosis not present

## 2021-01-26 DIAGNOSIS — K760 Fatty (change of) liver, not elsewhere classified: Secondary | ICD-10-CM | POA: Diagnosis not present

## 2021-01-26 DIAGNOSIS — I10 Essential (primary) hypertension: Secondary | ICD-10-CM | POA: Diagnosis not present

## 2021-01-26 DIAGNOSIS — Z87442 Personal history of urinary calculi: Secondary | ICD-10-CM | POA: Diagnosis not present

## 2021-01-26 DIAGNOSIS — B964 Proteus (mirabilis) (morganii) as the cause of diseases classified elsewhere: Secondary | ICD-10-CM | POA: Diagnosis not present

## 2021-01-26 DIAGNOSIS — Z79899 Other long term (current) drug therapy: Secondary | ICD-10-CM | POA: Diagnosis not present

## 2021-01-26 DIAGNOSIS — K5730 Diverticulosis of large intestine without perforation or abscess without bleeding: Secondary | ICD-10-CM | POA: Diagnosis not present

## 2021-01-26 DIAGNOSIS — G5603 Carpal tunnel syndrome, bilateral upper limbs: Secondary | ICD-10-CM | POA: Diagnosis not present

## 2021-01-26 DIAGNOSIS — J4540 Moderate persistent asthma, uncomplicated: Secondary | ICD-10-CM | POA: Diagnosis not present

## 2021-01-26 DIAGNOSIS — Z85820 Personal history of malignant melanoma of skin: Secondary | ICD-10-CM | POA: Diagnosis not present

## 2021-01-26 DIAGNOSIS — Z792 Long term (current) use of antibiotics: Secondary | ICD-10-CM | POA: Diagnosis not present

## 2021-01-26 DIAGNOSIS — Z9181 History of falling: Secondary | ICD-10-CM | POA: Diagnosis not present

## 2021-01-26 DIAGNOSIS — N393 Stress incontinence (female) (male): Secondary | ICD-10-CM | POA: Diagnosis not present

## 2021-01-26 DIAGNOSIS — M519 Unspecified thoracic, thoracolumbar and lumbosacral intervertebral disc disorder: Secondary | ICD-10-CM | POA: Diagnosis not present

## 2021-01-26 DIAGNOSIS — K219 Gastro-esophageal reflux disease without esophagitis: Secondary | ICD-10-CM | POA: Diagnosis not present

## 2021-01-26 DIAGNOSIS — M009 Pyogenic arthritis, unspecified: Secondary | ICD-10-CM | POA: Diagnosis not present

## 2021-01-26 DIAGNOSIS — G43909 Migraine, unspecified, not intractable, without status migrainosus: Secondary | ICD-10-CM | POA: Diagnosis not present

## 2021-01-26 DIAGNOSIS — K635 Polyp of colon: Secondary | ICD-10-CM | POA: Diagnosis not present

## 2021-01-26 DIAGNOSIS — T8452XA Infection and inflammatory reaction due to internal left hip prosthesis, initial encounter: Secondary | ICD-10-CM | POA: Diagnosis not present

## 2021-01-26 DIAGNOSIS — Z452 Encounter for adjustment and management of vascular access device: Secondary | ICD-10-CM | POA: Diagnosis not present

## 2021-01-26 DIAGNOSIS — M150 Primary generalized (osteo)arthritis: Secondary | ICD-10-CM | POA: Diagnosis not present

## 2021-01-26 DIAGNOSIS — Z96652 Presence of left artificial knee joint: Secondary | ICD-10-CM | POA: Diagnosis not present

## 2021-01-30 ENCOUNTER — Telehealth: Payer: Self-pay | Admitting: Orthopaedic Surgery

## 2021-01-30 DIAGNOSIS — K635 Polyp of colon: Secondary | ICD-10-CM | POA: Diagnosis not present

## 2021-01-30 DIAGNOSIS — K760 Fatty (change of) liver, not elsewhere classified: Secondary | ICD-10-CM | POA: Diagnosis not present

## 2021-01-30 DIAGNOSIS — Z85820 Personal history of malignant melanoma of skin: Secondary | ICD-10-CM | POA: Diagnosis not present

## 2021-01-30 DIAGNOSIS — M150 Primary generalized (osteo)arthritis: Secondary | ICD-10-CM | POA: Diagnosis not present

## 2021-01-30 DIAGNOSIS — K5730 Diverticulosis of large intestine without perforation or abscess without bleeding: Secondary | ICD-10-CM | POA: Diagnosis not present

## 2021-01-30 DIAGNOSIS — Z87891 Personal history of nicotine dependence: Secondary | ICD-10-CM | POA: Diagnosis not present

## 2021-01-30 DIAGNOSIS — I10 Essential (primary) hypertension: Secondary | ICD-10-CM | POA: Diagnosis not present

## 2021-01-30 DIAGNOSIS — J4540 Moderate persistent asthma, uncomplicated: Secondary | ICD-10-CM | POA: Diagnosis not present

## 2021-01-30 DIAGNOSIS — M519 Unspecified thoracic, thoracolumbar and lumbosacral intervertebral disc disorder: Secondary | ICD-10-CM | POA: Diagnosis not present

## 2021-01-30 DIAGNOSIS — Z792 Long term (current) use of antibiotics: Secondary | ICD-10-CM | POA: Diagnosis not present

## 2021-01-30 DIAGNOSIS — K219 Gastro-esophageal reflux disease without esophagitis: Secondary | ICD-10-CM | POA: Diagnosis not present

## 2021-01-30 DIAGNOSIS — Z79899 Other long term (current) drug therapy: Secondary | ICD-10-CM | POA: Diagnosis not present

## 2021-01-30 DIAGNOSIS — N393 Stress incontinence (female) (male): Secondary | ICD-10-CM | POA: Diagnosis not present

## 2021-01-30 DIAGNOSIS — G43909 Migraine, unspecified, not intractable, without status migrainosus: Secondary | ICD-10-CM | POA: Diagnosis not present

## 2021-01-30 DIAGNOSIS — Z96652 Presence of left artificial knee joint: Secondary | ICD-10-CM | POA: Diagnosis not present

## 2021-01-30 DIAGNOSIS — Z9181 History of falling: Secondary | ICD-10-CM | POA: Diagnosis not present

## 2021-01-30 DIAGNOSIS — Z452 Encounter for adjustment and management of vascular access device: Secondary | ICD-10-CM | POA: Diagnosis not present

## 2021-01-30 DIAGNOSIS — B964 Proteus (mirabilis) (morganii) as the cause of diseases classified elsewhere: Secondary | ICD-10-CM | POA: Diagnosis not present

## 2021-01-30 DIAGNOSIS — G5603 Carpal tunnel syndrome, bilateral upper limbs: Secondary | ICD-10-CM | POA: Diagnosis not present

## 2021-01-30 DIAGNOSIS — T8452XA Infection and inflammatory reaction due to internal left hip prosthesis, initial encounter: Secondary | ICD-10-CM | POA: Diagnosis not present

## 2021-01-30 DIAGNOSIS — Z87442 Personal history of urinary calculi: Secondary | ICD-10-CM | POA: Diagnosis not present

## 2021-01-30 NOTE — Telephone Encounter (Signed)
Amy (physical therapist) from [facility] called for extended home health pt for 2 more weeks. Please call Amy at [phone]. If unable to answer leave VM on secure line.

## 2021-01-31 ENCOUNTER — Ambulatory Visit: Payer: PPO | Admitting: Physician Assistant

## 2021-01-31 ENCOUNTER — Encounter: Payer: Self-pay | Admitting: Orthopaedic Surgery

## 2021-01-31 ENCOUNTER — Ambulatory Visit (INDEPENDENT_AMBULATORY_CARE_PROVIDER_SITE_OTHER): Payer: PPO | Admitting: Orthopaedic Surgery

## 2021-01-31 DIAGNOSIS — T8452XA Infection and inflammatory reaction due to internal left hip prosthesis, initial encounter: Secondary | ICD-10-CM

## 2021-01-31 DIAGNOSIS — Z96642 Presence of left artificial hip joint: Secondary | ICD-10-CM

## 2021-01-31 NOTE — Telephone Encounter (Signed)
Lvm informing 

## 2021-01-31 NOTE — Progress Notes (Signed)
The patient is just over a month status post irrigation and debridement with polyliner exchange and hip ball exchange of an infected left total hip arthroplasty.  She reports increased motion and strength and reports that she is getting better overall.  Her left hip incision is healed over nicely.  There is no drainage and no redness.  I have cautioned her that she still may have residual chronic infection that would require [place] to remove the entire implant and place an antibiotic spacer.  Hopefully that will not be the case and the infection was addressed with our surgery and with postoperative antibiotics.  She is followed closely by infectious disease.  I will see her back in a month with an AP and lateral of her left operative hip.  If there are any issues before then she will let us know.

## 2021-01-31 NOTE — Telephone Encounter (Signed)
Is this ok?

## 2021-02-02 DIAGNOSIS — M009 Pyogenic arthritis, unspecified: Secondary | ICD-10-CM | POA: Diagnosis not present

## 2021-02-05 ENCOUNTER — Encounter: Payer: Self-pay | Admitting: Internal Medicine

## 2021-02-05 DIAGNOSIS — B964 Proteus (mirabilis) (morganii) as the cause of diseases classified elsewhere: Secondary | ICD-10-CM | POA: Diagnosis not present

## 2021-02-05 DIAGNOSIS — T8452XA Infection and inflammatory reaction due to internal left hip prosthesis, initial encounter: Secondary | ICD-10-CM | POA: Diagnosis not present

## 2021-02-06 LAB — FUNGAL ORGANISM REFLEX

## 2021-02-06 LAB — FUNGUS CULTURE WITH STAIN

## 2021-02-06 LAB — FUNGUS CULTURE RESULT

## 2021-02-09 DIAGNOSIS — M009 Pyogenic arthritis, unspecified: Secondary | ICD-10-CM | POA: Diagnosis not present

## 2021-02-12 DIAGNOSIS — Z452 Encounter for adjustment and management of vascular access device: Secondary | ICD-10-CM | POA: Diagnosis not present

## 2021-02-12 DIAGNOSIS — G43909 Migraine, unspecified, not intractable, without status migrainosus: Secondary | ICD-10-CM | POA: Diagnosis not present

## 2021-02-12 DIAGNOSIS — Z79899 Other long term (current) drug therapy: Secondary | ICD-10-CM | POA: Diagnosis not present

## 2021-02-12 DIAGNOSIS — Z87442 Personal history of urinary calculi: Secondary | ICD-10-CM | POA: Diagnosis not present

## 2021-02-12 DIAGNOSIS — N393 Stress incontinence (female) (male): Secondary | ICD-10-CM | POA: Diagnosis not present

## 2021-02-12 DIAGNOSIS — Z96652 Presence of left artificial knee joint: Secondary | ICD-10-CM | POA: Diagnosis not present

## 2021-02-12 DIAGNOSIS — B964 Proteus (mirabilis) (morganii) as the cause of diseases classified elsewhere: Secondary | ICD-10-CM | POA: Diagnosis not present

## 2021-02-12 DIAGNOSIS — K760 Fatty (change of) liver, not elsewhere classified: Secondary | ICD-10-CM | POA: Diagnosis not present

## 2021-02-12 DIAGNOSIS — K635 Polyp of colon: Secondary | ICD-10-CM | POA: Diagnosis not present

## 2021-02-12 DIAGNOSIS — M150 Primary generalized (osteo)arthritis: Secondary | ICD-10-CM | POA: Diagnosis not present

## 2021-02-12 DIAGNOSIS — Z85820 Personal history of malignant melanoma of skin: Secondary | ICD-10-CM | POA: Diagnosis not present

## 2021-02-12 DIAGNOSIS — Z792 Long term (current) use of antibiotics: Secondary | ICD-10-CM | POA: Diagnosis not present

## 2021-02-12 DIAGNOSIS — K219 Gastro-esophageal reflux disease without esophagitis: Secondary | ICD-10-CM | POA: Diagnosis not present

## 2021-02-12 DIAGNOSIS — T8452XA Infection and inflammatory reaction due to internal left hip prosthesis, initial encounter: Secondary | ICD-10-CM | POA: Diagnosis not present

## 2021-02-12 DIAGNOSIS — K5730 Diverticulosis of large intestine without perforation or abscess without bleeding: Secondary | ICD-10-CM | POA: Diagnosis not present

## 2021-02-12 DIAGNOSIS — J4540 Moderate persistent asthma, uncomplicated: Secondary | ICD-10-CM | POA: Diagnosis not present

## 2021-02-12 DIAGNOSIS — Z9181 History of falling: Secondary | ICD-10-CM | POA: Diagnosis not present

## 2021-02-12 DIAGNOSIS — Z87891 Personal history of nicotine dependence: Secondary | ICD-10-CM | POA: Diagnosis not present

## 2021-02-12 DIAGNOSIS — G5603 Carpal tunnel syndrome, bilateral upper limbs: Secondary | ICD-10-CM | POA: Diagnosis not present

## 2021-02-12 DIAGNOSIS — M519 Unspecified thoracic, thoracolumbar and lumbosacral intervertebral disc disorder: Secondary | ICD-10-CM | POA: Diagnosis not present

## 2021-02-12 DIAGNOSIS — I10 Essential (primary) hypertension: Secondary | ICD-10-CM | POA: Diagnosis not present

## 2021-02-13 ENCOUNTER — Encounter: Payer: Self-pay | Admitting: Orthopaedic Surgery

## 2021-02-13 ENCOUNTER — Other Ambulatory Visit: Payer: Self-pay

## 2021-02-13 ENCOUNTER — Other Ambulatory Visit: Payer: Self-pay | Admitting: Orthopaedic Surgery

## 2021-02-13 MED ORDER — METHOCARBAMOL 500 MG PO TABS: 500.0000 mg | ORAL_TABLET | Freq: Three times a day (TID) | ORAL | 1 refills | Status: DC | PRN

## 2021-02-14 ENCOUNTER — Encounter: Payer: Self-pay | Admitting: Internal Medicine

## 2021-02-14 ENCOUNTER — Telehealth: Payer: Self-pay

## 2021-02-14 ENCOUNTER — Ambulatory Visit: Payer: PPO | Admitting: Internal Medicine

## 2021-02-14 ENCOUNTER — Other Ambulatory Visit: Payer: Self-pay

## 2021-02-14 VITALS — BP 119/73 | HR 79 | Temp 98.5°F | Wt 214.0 lb

## 2021-02-14 DIAGNOSIS — T8452XA Infection and inflammatory reaction due to internal left hip prosthesis, initial encounter: Secondary | ICD-10-CM

## 2021-02-14 DIAGNOSIS — T8452XD Infection and inflammatory reaction due to internal left hip prosthesis, subsequent encounter: Secondary | ICD-10-CM

## 2021-02-14 MED ORDER — CEFADROXIL 500 MG PO CAPS: 1000.0000 mg | ORAL_CAPSULE | Freq: Two times a day (BID) | ORAL | 0 refills | Status: DC

## 2021-02-14 NOTE — Progress Notes (Signed)
Sharon [name] for Infectious Disease  CHIEF COMPLAINT:    Follow up for left hip PJI  SUBJECTIVE:    Sharon [name] is a 65 y.o. female with PMHx as below who presents to the clinic for left hip PJI.   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  She has been on Ancef via PICC line since that time.  She saw Janene Madeira on 01/23/21 for televisit hospital follow up and her post-op fevers resolved and she was doing well managing her PICC.  Recent OPAT labs on 9/12 show ESR 71, CRP on 01/30/21 was 21.  Her baseline ESR is 85.  She will complete 6 weeks of Ancef on 02/16/21.  She saw Dr Rush Farmer on 9/7 at which point her incision had healed over nicely and she was doing well.  They did discuss the potential for a 2-stage exchange if infection not fully eradicated.  Please see A&P for the details of today's visit and status of the patient's medical problems.   Patient's Medications  New Prescriptions   No medications on file  Previous Medications   ALBUTEROL (PROVENTIL HFA;VENTOLIN HFA) 108 (90 BASE) MCG/ACT INHALER    Inhale 2 puffs into the lungs every 4 (four) hours as needed for wheezing or shortness of breath.   ALBUTEROL (PROVENTIL) (2.5 MG/3ML) 0.083% NEBULIZER SOLUTION    USE ONE VIAL IN NEBULIZER EVERY 4 HOURS AS NEEDED FOR WHEEZING   ASPIRIN EC 81 MG EC TABLET    Take 1 tablet (81 mg total) by mouth 2 (two) times daily. Swallow whole.   CALCIUM-VITAMIN D-VITAMIN K (VIACTIV CALCIUM PLUS D) 650-12.5-40 MG-MCG-MCG CHEW    Chew 1 tablet by mouth in the morning, at noon, and at bedtime.   CEFAZOLIN (ANCEF) IVPB    Inject 2 g into the vein every 8 (eight) hours. Indication: Hip wound infection First Dose: Yes Last Day of Therapy: 02/16/2021 Labs - Once weekly:  CBC/D and BMP, Labs - Every other week:  ESR and CRP Method of administration: IV Push Method of  administration may be changed at the discretion of home infusion pharmacist based upon assessment of the patient and/or caregiver's ability to self-administer the medication ordered.   DOCUSATE SODIUM (COLACE) 100 MG CAPSULE    Take 100 mg by mouth 2 (two) times daily as needed for mild constipation.   FEXOFENADINE (ALLEGRA) 180 MG TABLET    Take 180 mg by mouth in the morning.   FLUTICASONE (FLONASE) 50 MCG/ACT NASAL SPRAY    Place 2 sprays into both nostrils at bedtime.   GABAPENTIN (NEURONTIN) 300 MG CAPSULE    Take 1 capsule (300 mg total) by mouth 2 (two) times daily.   HYDROCODONE-ACETAMINOPHEN (NORCO) 10-325 MG TABLET    Take 1 tablet by mouth 3 (three) times daily as needed.   LIDOCAINE HCL-BENZYL ALCOHOL (SALONPAS LIDOCAINE PLUS EX)    Place 1 patch onto the skin every 8 (eight) hours as needed (pain.).   METHOCARBAMOL (ROBAXIN) 500 MG TABLET    Take 1 tablet (500 mg total) by mouth every 8 (eight) hours as needed for muscle spasms.   METOPROLOL TARTRATE (LOPRESSOR) 25 MG TABLET    Take 1 tablet (25 mg total) by mouth 2 (two) times daily.   MULTIPLE VITAMINS-MINERALS (BARIATRIC MULTIVITAMINS/IRON PO)    Take 1 tablet by mouth in the morning.  ONDANSETRON (ZOFRAN-ODT) 8 MG DISINTEGRATING TABLET    DISSOLVE 1 TABLET IN MOUTH EVERY 8 HOURS AS NEEDED FOR NAUSEA OR VOMITING   OXYCODONE (OXY IR/ROXICODONE) 5 MG IMMEDIATE RELEASE TABLET    Take 1-2 tablets (5-10 mg total) by mouth every 6 (six) hours as needed for moderate pain (pain score 4-6).   PANTOPRAZOLE (PROTONIX) 40 MG TABLET    TAKE 1 TABLET BY MOUTH EVERY DAY FOR REFLUX  Modified Medications   No medications on file  Discontinued Medications   No medications on file      Past Medical History:  Diagnosis Date   Anginal pain ([place])    in February 3785   Biliary colic    Cancer ([place])    melanoma: lt. inner thight   Carpal tunnel syndrome    bilateral   Chronic allergic rhinitis    Chronic urticaria    Complication of anesthesia     confusion for a couple of days after waking after having multiple knee surgery's, does well with spinal   Degenerative disk disease    Diverticulosis of colon    Dyspnea    Dysrhythmia    February 2022   Fatty liver    Fibrocystic breast disease 1996   GERD (gastroesophageal reflux disease)    History of colon polyps    HYPERPLASTIC 09-14-2015   History of kidney stones    passed on own   History of melanoma excision    left thigh ,inside 2009--- per pt also has had severeal excision of office for almost melanoma   Hypertension    Migraine    since age 18 not as frequent now   Moderate persistent asthma, uncomplicated    followed by dr gallagher (cone allergy and asthma)   OA (osteoarthritis)    all joints   SUI (stress urinary incontinence, female)     Social History   Tobacco Use   Smoking status: Former    Packs/day: 1.00    Years: 25.00    Pack years: 25.00    Types: Cigarettes    Start date: 08/02/1972    Quit date: 05/27/2006    Years since quitting: 14.7   Smokeless tobacco: Never  Vaping Use   Vaping Use: Never used  Substance Use Topics   Alcohol use: Yes    Alcohol/week: 0.0 standard drinks    Comment: occas.   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    Augmentin [Amoxicillin-Pot Clavulanate] Diarrhea  and Nausea Only    Tolerated cefazolin and ceftriaxone 12/2020   Neomycin-Bacitracin Zn-Polymyx Dermatitis    NEOSPORIN    Review of Systems  Constitutional:  Negative for fever.  Gastrointestinal: Negative.   Musculoskeletal:  Positive for joint pain.  Skin:  Negative for rash.    OBJECTIVE:    Vitals:   02/14/21 1402  BP: 119/73  Pulse: 79  Temp: 98.5 F (36.9 C)  TempSrc: Oral  SpO2: 96%  Weight: 214 lb (97.1 kg)   Body mass index is 41.79 kg/m.  Physical Exam Constitutional:      General: She is not in acute distress.    Appearance: Normal appearance.  HENT:     Head: Normocephalic and atraumatic.  Pulmonary:     Effort: Pulmonary effort is normal. No respiratory distress.  Musculoskeletal:     Comments: PICC site clean, dry, intact No tenderness.   Skin:    General: Skin is warm and dry.     Findings: No rash.  Neurological:     Mental Status: She is alert.     Labs and Microbiology: CBC Latest Ref Rng & Units 01/08/2021 01/07/2021 01/06/2021  WBC 4.0 - 10.5 K/uL 5.4 5.8 7.4  Hemoglobin 12.0 - 15.0 g/dL 8.9(L) 8.4(L) 8.5(L)  Hematocrit 36.0 - 46.0 % 27.6(L) 26.5(L) 26.3(L)  Platelets 150 - 400 K/uL 290 281 301   CMP Latest Ref Rng & Units 01/07/2021 01/06/2021 01/04/2021  Glucose 70 - 99 mg/dL 115(H) 107(H) 188(H)  BUN 8 - 23 mg/dL 9 7(L) 11  Creatinine 0.44 - 1.00 mg/dL 0.65 0.59 0.66  Sodium 135 - 145 mmol/L 134(L) 133(L) 138  Potassium 3.5 - 5.1 mmol/L 3.4(L) 3.5 3.0(L)  Chloride 98 - 111 mmol/L 98 102 105  CO2 22 - 32 mmol/L _0 Calcium 8.9 - 10.3 mg/dL 8.0(L) 7.8(L) 8.0(L)  Total Protein 6.5 - 8.1 g/dL 5.3(L) - -  Total Bilirubin 0.3 - 1.2 mg/dL 0.2(L) - -  Alkaline Phos 38 - 126 U/L 82 - -  AST 15 - 41 U/L 19 - -  ALT 0 - 44 U/L 14 - -       ASSESSMENT & PLAN:    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  She will complete 6 weeks of IV cefazolin on 02/16/21 at which point will remove her PICC line and transition to oral antibiotics for an additional 6 weeks to complete 12 weeks total therapy.  Will prescribe cefadroxil 1gm BID today.  At that time will decide upon whether to continue further or have her stop therapy and observe.  Discussed with patient that we are not sure if infection will have been eradicated at that time or is simply being suppressed and only test of cure will be to see how she potentially does off antibiotics.  If she does have residual chronic infection she may need 2-stage exchange.  Follow up in 6 weeks.       Raynelle Highland for Infectious Disease [facility] 02/14/2021, 2:17 PM  I spent 30 minutes dedicated to the care of this patient on the date of this encounter to include pre-visit review of records, face-to-face time with the patient discussing left hip PJI, and post-visit ordering of testing.

## 2021-02-14 NOTE — Assessment & Plan Note (Signed)
Status post I&D with polyethylene liner exchange and hip/ball exchange 01/02/21 with OR cultures growing Proteus mirabilis.  She will complete 6 weeks of IV cefazolin on 02/16/21 at which point will remove her PICC line and transition to oral antibiotics for an additional 6 weeks to complete 12 weeks total therapy.  Will prescribe cefadroxil 1gm BID today.  At that time will decide upon whether to continue further or have her stop therapy and observe.  Discussed with patient that we are not sure if infection will have been eradicated at that time or is simply being suppressed and only test of cure will be to see how she potentially does off antibiotics.  If she does have residual chronic infection she may need 2-stage exchange.  Follow up in 6 weeks.

## 2021-02-14 NOTE — Patient Instructions (Signed)
Thank you for coming to see me today. It was a pleasure seeing you.  To Do: Continue IV Ancef via PICC line through 9/23 then we will remove PICC After 9/23, start taking Cefadroxil 500mg  Twice Daily by mouth Prescription sent to your pharmacy today Follow up in 6 weeks  If you have any questions or concerns, please do not hesitate to call the office at [phone].  Take Care,   Jule Ser, DO

## 2021-02-14 NOTE — Telephone Encounter (Signed)
Per MD called [facility] with verbal orders to pull picc line. Relayed orders to pharmacy who will update nursing. Orders repeated/confirmed before ending call. Leatrice Jewels, RMA

## 2021-02-15 ENCOUNTER — Ambulatory Visit: Payer: PPO

## 2021-02-15 ENCOUNTER — Encounter: Payer: Self-pay | Admitting: Orthopedic Surgery

## 2021-02-15 ENCOUNTER — Ambulatory Visit (INDEPENDENT_AMBULATORY_CARE_PROVIDER_SITE_OTHER): Payer: PPO | Admitting: Orthopedic Surgery

## 2021-02-15 VITALS — BP 137/72 | HR 77 | Ht 60.0 in | Wt 214.2 lb

## 2021-02-15 DIAGNOSIS — Z96651 Presence of right artificial knee joint: Secondary | ICD-10-CM

## 2021-02-15 DIAGNOSIS — M1710 Unilateral primary osteoarthritis, unspecified knee: Secondary | ICD-10-CM

## 2021-02-15 DIAGNOSIS — M170 Bilateral primary osteoarthritis of knee: Secondary | ICD-10-CM

## 2021-02-15 DIAGNOSIS — Z96652 Presence of left artificial knee joint: Secondary | ICD-10-CM | POA: Diagnosis not present

## 2021-02-15 NOTE — Progress Notes (Signed)
Chief Complaint  Patient presents with   Knee Pain    Bilateral/ left one is giving me a fit/the right one is doing ok.       Encounter Diagnoses  Name Primary?   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 Dr. Ninfa Linden following  There was concern because when she had the acute infectious process in the hip her knee was swollen and she has a left total knee which she has had some issues with persistent pain around the soft tissues which may be related to flexion extension gap balancing as she had postoperative popping sensation had to have revision surgery  At this point the knee looks good incision is clean dry and intact there is no erythema or warmth to the joint she does have tenderness over the iliotibial band laterally and pes tendons medially no effusion is detected  X-rays were obtained serial images going back to the time of the surgeries were evaluated and there is no sign of loosening  Would recommend we see her when her p.o. antibiotics are finished in about 6 or 7 weeks and I will correspond with Dr. Ninfa Linden regarding evaluation of the left knee prosthesis

## 2021-02-23 DIAGNOSIS — Z6841 Body Mass Index (BMI) 40.0 and over, adult: Secondary | ICD-10-CM | POA: Diagnosis not present

## 2021-02-23 DIAGNOSIS — M25552 Pain in left hip: Secondary | ICD-10-CM | POA: Diagnosis not present

## 2021-02-23 DIAGNOSIS — G894 Chronic pain syndrome: Secondary | ICD-10-CM | POA: Diagnosis not present

## 2021-02-23 DIAGNOSIS — Z79899 Other long term (current) drug therapy: Secondary | ICD-10-CM | POA: Diagnosis not present

## 2021-02-28 ENCOUNTER — Ambulatory Visit (INDEPENDENT_AMBULATORY_CARE_PROVIDER_SITE_OTHER): Payer: PPO | Admitting: Orthopaedic Surgery

## 2021-02-28 ENCOUNTER — Other Ambulatory Visit: Payer: Self-pay

## 2021-02-28 ENCOUNTER — Ambulatory Visit: Payer: Self-pay

## 2021-02-28 DIAGNOSIS — T8452XA Infection and inflammatory reaction due to internal left hip prosthesis, initial encounter: Secondary | ICD-10-CM | POA: Diagnosis not present

## 2021-02-28 DIAGNOSIS — Z96642 Presence of left artificial hip joint: Secondary | ICD-10-CM

## 2021-02-28 NOTE — Progress Notes (Signed)
The patient is now 4-month status post an I&D of a deep infection of the left total hip.  She has been having worsening left hip pain.  Her PICC line has been removed and she is on oral antibiotics now.  She denies any fever or chills.  Examination of the left hip shows the incision is healed over nicely.  There is no redness or induration of the skin.  She is morbidly obese her thighs are larger.  She feels like there is more fullness on the left side.  An x-ray of the pelvis and left hip show no complicating features of the hardware itself.  I would like to order a CBC, sed rate and a CRP today so we can see if she is responding to the antibiotics.  I will call her with those results.  From my standpoint, I will see her back in a month to see how she is doing overall.  Worse case scenario would be considering an excision arthroplasty.  I did give her reassurance that so far the replacement itself looks okay.

## 2021-02-28 NOTE — Addendum Note (Signed)
Addended by: Jacklyn Shell on: 02/28/2021 03:38 PM   Modules accepted: Orders

## 2021-03-01 LAB — CBC WITH DIFFERENTIAL/PLATELET
Absolute Monocytes: 940 cells/uL (ref 200–950)
Basophils Absolute: 70 cells/uL (ref 0–200)
Basophils Relative: 0.8 %
Eosinophils Absolute: 261 cells/uL (ref 15–500)
Eosinophils Relative: 3 %
HCT: 37.2 % (ref 35.0–45.0)
Hemoglobin: 12.2 g/dL (ref 11.7–15.5)
Lymphs Abs: 2393 cells/uL (ref 850–3900)
MCH: 29.4 pg (ref 27.0–33.0)
MCHC: 32.8 g/dL (ref 32.0–36.0)
MCV: 89.6 fL (ref 80.0–100.0)
MPV: 10.3 fL (ref 7.5–12.5)
Monocytes Relative: 10.8 %
Neutro Abs: 5037 cells/uL (ref 1500–7800)
Neutrophils Relative %: 57.9 %
Platelets: 438 10*3/uL — ABNORMAL HIGH (ref 140–400)
RBC: 4.15 10*6/uL (ref 3.80–5.10)
RDW: 13.9 % (ref 11.0–15.0)
Total Lymphocyte: 27.5 %
WBC: 8.7 10*3/uL (ref 3.8–10.8)

## 2021-03-01 LAB — C-REACTIVE PROTEIN: CRP: 140.7 mg/L — ABNORMAL HIGH (ref <8.0)

## 2021-03-01 LAB — SEDIMENTATION RATE: Sed Rate: 103 mm/h — ABNORMAL HIGH (ref 0–30)

## 2021-03-02 ENCOUNTER — Other Ambulatory Visit: Payer: Self-pay

## 2021-03-02 DIAGNOSIS — Z96642 Presence of left artificial hip joint: Secondary | ICD-10-CM

## 2021-03-07 ENCOUNTER — Encounter: Payer: Self-pay | Admitting: Orthopaedic Surgery

## 2021-03-08 ENCOUNTER — Other Ambulatory Visit: Payer: Self-pay | Admitting: Orthopaedic Surgery

## 2021-03-08 MED ORDER — DIAZEPAM 5 MG PO TABS: 5.0000 mg | ORAL_TABLET | Freq: Once | ORAL | 0 refills | Status: AC

## 2021-03-22 ENCOUNTER — Other Ambulatory Visit: Payer: Self-pay

## 2021-03-22 ENCOUNTER — Ambulatory Visit
Admission: RE | Admit: 2021-03-22 | Discharge: 2021-03-22 | Disposition: A | Discharge status: Home / Self Care | Payer: PPO | Source: Ambulatory Visit | Attending: Orthopaedic Surgery | Admitting: Orthopaedic Surgery

## 2021-03-22 DIAGNOSIS — Z96642 Presence of left artificial hip joint: Secondary | ICD-10-CM

## 2021-03-22 DIAGNOSIS — M25552 Pain in left hip: Secondary | ICD-10-CM | POA: Diagnosis not present

## 2021-03-22 DIAGNOSIS — R102 Pelvic and perineal pain: Secondary | ICD-10-CM | POA: Diagnosis not present

## 2021-03-22 DIAGNOSIS — M79652 Pain in left thigh: Secondary | ICD-10-CM | POA: Diagnosis not present

## 2021-03-22 DIAGNOSIS — Z471 Aftercare following joint replacement surgery: Secondary | ICD-10-CM | POA: Diagnosis not present

## 2021-03-28 ENCOUNTER — Encounter: Payer: Self-pay | Admitting: Internal Medicine

## 2021-03-28 ENCOUNTER — Ambulatory Visit (INDEPENDENT_AMBULATORY_CARE_PROVIDER_SITE_OTHER): Payer: PPO | Admitting: Internal Medicine

## 2021-03-28 ENCOUNTER — Encounter: Payer: Self-pay | Admitting: Orthopaedic Surgery

## 2021-03-28 ENCOUNTER — Ambulatory Visit (INDEPENDENT_AMBULATORY_CARE_PROVIDER_SITE_OTHER): Payer: PPO | Admitting: Orthopaedic Surgery

## 2021-03-28 ENCOUNTER — Other Ambulatory Visit: Payer: Self-pay

## 2021-03-28 VITALS — BP 118/81 | HR 68 | Temp 99.1°F

## 2021-03-28 DIAGNOSIS — T8452XD Infection and inflammatory reaction due to internal left hip prosthesis, subsequent encounter: Secondary | ICD-10-CM

## 2021-03-28 DIAGNOSIS — T8452XA Infection and inflammatory reaction due to internal left hip prosthesis, initial encounter: Secondary | ICD-10-CM

## 2021-03-28 DIAGNOSIS — Z96642 Presence of left artificial hip joint: Secondary | ICD-10-CM

## 2021-03-28 MED ORDER — ONDANSETRON 4 MG PO TBDP: 4.0000 mg | ORAL_TABLET | Freq: Three times a day (TID) | ORAL | 0 refills | Status: DC | PRN

## 2021-03-28 MED ORDER — CEFADROXIL 500 MG PO CAPS: 1000.0000 mg | ORAL_CAPSULE | Freq: Two times a day (BID) | ORAL | 0 refills | Status: DC

## 2021-03-28 NOTE — Progress Notes (Signed)
The patient is very well-known to me.  She is a year out from a total hip arthroplasty through a direct anterior approach of her left hip.  She is someone who used to have a BMI of the mid to upper 40s.  When I saw her for hip replacement surgery her BMI was 44 and now it is gotten down to 38 since she had surgery.  She had debilitating end-stage arthritis of her left hip and she was at the end of her quality of life due to the severity of her hip pain.  She petitioned her insurance company to allow [place] to do the surgery and we performed a successful left total hip arthroplasty in the short run.  She did have some soft tissue breakdown postoperatively of her incision which can be seen through anterior hip surgery and she was successfully able to heal this through wound center.  She had done well for many months and then in August of this year came in with some drainage from her left hip incision which was new.  I took her to the operating room and performed an aggressive irrigation debridement of her left hip and placed antibiotic beads which were dissolvable.  She was seen by the infectious disease specialist and she grew out a sensitive Proteus species.  She had been doing well until recently she developed worsening left hip pain again.  Her infectious parameter labs were elevated including her CRP which was significantly.  She denies any fever and chills but does report significant left hip and muscle pain.  I sent her for an MRI of her left hip and the left hip still shows significant edema in the soft tissue around that hip as well as into the bone itself of the femur.  I shared those MRI findings with her today in the office and expressed my concern about the possibility of needing to have everything removed from her hip in order to clear the infection.  She understands this is something I have not done many times before and I am quite concerned about this.  She wants to avoid any other surgery.  Her left hip  incision still does not appear red or warm and it is healed over completely but she is having pain and MRI findings are quite worrisome.  I showed her hip model and explained in detail treatment options.  She actually is seeing the infectious disease specialist I believe tomorrow.  I would like to send her for an opinion to [facility] in [place] with their periprosthetic joint infection center given their vast experience in dealing with periprosthetic joint infections.  She agrees to having this referral.  We will see if we can get this set up soon.

## 2021-03-28 NOTE — Patient Instructions (Signed)
Thank you for coming to see me today. It was a pleasure seeing you.  To Do: Continue Cefadroxil 1000mg  twice per day Follow up with me in 4 weeks Labs today.  If you have any questions or concerns, please do not hesitate to call the office at [phone].  Take Care,   Sharon [name]

## 2021-03-28 NOTE — Assessment & Plan Note (Signed)
  Unfortunately, recent inflammatory markers have increased and MRI findings concerning for worsening infection despite antibiotic therapy that suggests she needs further source control with two-stage exchange.  We will continue antibiotics for now pending her evaluation at [facility] in [place].  Anticipate another course of IV antibiotics via PICC line at the time of her surgery.  Will recheck labs today and follow-up in 4 weeks.  Refills sent for cefadroxil 1000 mg twice daily to her pharmacy.

## 2021-03-28 NOTE — Progress Notes (Signed)
[facility]  CHIEF COMPLAINT:    Follow up for left hip PJI  SUBJECTIVE:    Sharon [name] is a 65 y.o. female with PMHx as below who presents to the clinic for left hip PJI.   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  Postoperatively from that initial surgery she had some soft tissue breakdown of her incision which was done through an anterior approach.  This was successfully able to heal with wound care.  She subsequently did well for a period of several months until August 2022 when she developed new drainage from her left hip incision.  She went to the operating room on 01/02/2021 for this infected left THA and underwent polyethylene liner exchange with hip/ball exchange along with irrigation and debridement.  Her operative cultures yielded a pansensitive Proteus mirabilis.  She had a PICC line placed and completed 6 weeks of IV antibiotics on 02/16/2021.  During the course of her IV antibiotics, inflammatory markers had improved with ESR 48 on 02/12/2021 compared to 85 as a baseline.  She concluded IV antibiotics and was subsequently transitioned to oral cefadroxil 1000 mg twice daily for continued coverage.  The plan was to provide her with at least 12 weeks of antibiotics from date of her initial surgery and then reassess at that point.  Unfortunately, she developed worsening left hip pain and was reevaluated by Dr. Ninfa Linden on 02/28/2021 at which point her inflammatory markers had increased considerably.  MRI was pursued which revealed significant edema in the soft tissue around the hip as well as into the bone of the femur.  There was also a sizable fluid collection measuring 2.4 x 3.5 x 9.3 cm at the incision site with a narrow neck extending into the anterior aspect of the joint.  She saw Dr. Ninfa Linden today who has referred her to [facility] in [place] with the periprosthetic  joint infection center given their expertise in this area.  Please see A&P for the details of today's visit and status of the patient's medical problems.   Patient's Medications  New Prescriptions   No medications on file  Previous Medications   ALBUTEROL (PROVENTIL HFA;VENTOLIN HFA) 108 (90 BASE) MCG/ACT INHALER    Inhale 2 puffs into the lungs every 4 (four) hours as needed for wheezing or shortness of breath.   ALBUTEROL (PROVENTIL) (2.5 MG/3ML) 0.083% NEBULIZER SOLUTION    USE ONE VIAL IN NEBULIZER EVERY 4 HOURS AS NEEDED FOR WHEEZING   ASPIRIN EC 81 MG EC TABLET    Take 1 tablet (81 mg total) by mouth 2 (two) times daily. Swallow whole.   CALCIUM-VITAMIN D-VITAMIN K (VIACTIV CALCIUM PLUS D) 650-12.5-40 MG-MCG-MCG CHEW    Chew 1 tablet by mouth in the morning, at noon, and at bedtime.   DOCUSATE SODIUM (COLACE) 100 MG CAPSULE    Take 100 mg by mouth 2 (two) times daily as needed for mild constipation.   FEXOFENADINE (ALLEGRA) 180 MG TABLET    Take 180 mg by mouth in the morning.   FLUTICASONE (FLONASE) 50 MCG/ACT NASAL SPRAY    Place 2 sprays into both nostrils at bedtime.   GABAPENTIN (NEURONTIN) 300 MG CAPSULE    Take 1 capsule (300 mg total) by mouth 2 (two) times daily.   HYDROCODONE-ACETAMINOPHEN (NORCO) 10-325 MG TABLET    Take 1 tablet by mouth 3 (three) times daily  as needed.   LIDOCAINE HCL-BENZYL ALCOHOL (SALONPAS LIDOCAINE PLUS EX)    Place 1 patch onto the skin every 8 (eight) hours as needed (pain.).   METHOCARBAMOL (ROBAXIN) 500 MG TABLET    Take 1 tablet (500 mg total) by mouth every 8 (eight) hours as needed for muscle spasms.   METOPROLOL TARTRATE (LOPRESSOR) 25 MG TABLET    Take 1 tablet (25 mg total) by mouth 2 (two) times daily.   MULTIPLE VITAMINS-MINERALS (BARIATRIC MULTIVITAMINS/IRON PO)    Take 1 tablet by mouth in the morning.   ONDANSETRON (ZOFRAN ODT) 4 MG DISINTEGRATING TABLET    Take 1 tablet (4 mg total) by mouth every 8 (eight) hours as needed for nausea or  vomiting.   OXYCODONE (OXY IR/ROXICODONE) 5 MG IMMEDIATE RELEASE TABLET    Take 5 mg by mouth 2 (two) times daily as needed.   PANTOPRAZOLE (PROTONIX) 40 MG TABLET    TAKE 1 TABLET BY MOUTH EVERY DAY FOR REFLUX  Modified Medications   Modified Medication Previous Medication   CEFADROXIL (DURICEF) 500 MG CAPSULE cefadroxil (DURICEF) 500 MG capsule      Take 2 capsules (1,000 mg total) by mouth 2 (two) times daily.    Take 2 capsules (1,000 mg total) by mouth 2 (two) times daily.  Discontinued Medications   No medications on file      Past Medical History:  Diagnosis Date   Anginal pain ([place])    in February 8563   Biliary colic    Cancer ([place])    melanoma: lt. inner thight   Carpal tunnel syndrome    bilateral   Chronic allergic rhinitis    Chronic urticaria    Complication of anesthesia    confusion for a couple of days after waking after having multiple knee surgery's, does well with spinal   Degenerative disk disease    Diverticulosis of colon    Dyspnea    Dysrhythmia    February 2022   Fatty liver    Fibrocystic breast disease 1996   GERD (gastroesophageal reflux disease)    History of colon polyps    HYPERPLASTIC 09-14-2015   History of kidney stones    passed on own   History of melanoma excision    left thigh ,inside 2009--- per pt also has had severeal excision of office for almost melanoma   Hypertension    Migraine    since age 65 not as frequent now   Moderate persistent asthma, uncomplicated    followed by dr gallagher (cone allergy and asthma)   OA (osteoarthritis)    all joints   SUI (stress urinary incontinence, female)     Social History   Tobacco Use   Smoking status: Former    Packs/day: 1.00    Years: 25.00    Pack years: 25.00    Types: Cigarettes    Start date: 08/02/1972    Quit date: 05/27/2006    Years since quitting: 14.8   Smokeless tobacco: Never  Vaping Use   Vaping Use: Never used  Substance Use Topics   Alcohol use: Yes     Alcohol/week: 0.0 standard drinks    Comment: occas.   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    Augmentin [Amoxicillin-Pot Clavulanate] Diarrhea and Nausea Only    Tolerated cefazolin and ceftriaxone 12/2020   Neomycin-Bacitracin Zn-Polymyx Dermatitis    NEOSPORIN    Review of Systems  Constitutional:  Negative for chills and fever.  Gastrointestinal: Negative.   Genitourinary: Negative.   Musculoskeletal:  Positive for joint pain.    OBJECTIVE:    Vitals:   03/28/21 1608  BP: 118/81  Pulse: 68  Temp: 99.1 F (37.3 C)  TempSrc: Oral  SpO2: 99%   There is no height or weight on file to calculate BMI.  Physical Exam Constitutional:      General: She is not in acute distress.    Appearance: Normal appearance.  HENT:     Head: Normocephalic and atraumatic.  Pulmonary:     Effort: Pulmonary effort is normal. No respiratory distress.  Abdominal:     General: There is no distension.     Palpations: Abdomen is soft.  Neurological:     General: No focal deficit present.     Mental Status: She is alert and oriented to person, place, and time.  Psychiatric:        Mood and Affect: Mood normal.        Behavior: Behavior normal.     Labs  and Microbiology: CBC Latest Ref Rng & Units 02/28/2021 01/08/2021 01/07/2021  WBC 3.8 - 10.8 Thousand/uL 8.7 5.4 5.8  Hemoglobin 11.7 - 15.5 g/dL 12.2 8.9(L) 8.4(L)  Hematocrit 35.0 - 45.0 % 37.2 27.6(L) 26.5(L)  Platelets 140 - 400 Thousand/uL 438(H) 290 281   CMP Latest Ref Rng & Units 01/07/2021 01/06/2021 01/04/2021  Glucose 70 - 99 mg/dL 115(H) 107(H) 188(H)  BUN 8 - 23 mg/dL 9 7(L) 11  Creatinine 0.44 - 1.00 mg/dL 0.65 0.59 0.66  Sodium 135 - 145 mmol/L 134(L) 133(L) 138  Potassium 3.5 - 5.1 mmol/L 3.4(L) 3.5 3.0(L)  Chloride 98 - 111 mmol/L 98 102 105  CO2 22 - 32 mmol/L _0 Calcium 8.9 - 10.3 mg/dL 8.0(L) 7.8(L) 8.0(L)  Total Protein 6.5 - 8.1 g/dL 5.3(L) - -  Total Bilirubin 0.3 - 1.2 mg/dL 0.2(L) - -  Alkaline Phos 38 - 126 U/L 82 - -  AST 15 - 41 U/L 19 - -  ALT 0 - 44 U/L 14 - -     No results found for this or any previous visit (from the past 240 hour(s)).  Imaging: MRI Left Hip 03/22/21    IMPRESSION: 1. Left total hip arthroplasty. Bone marrow edema in the proximal left femur around the femoral stem component with a cortical defect along the anterior aspect of the proximal femoral diaphysis with fluid extending from the medullary cavity into the adjacent soft tissues and surrounding soft  tissue edema most concerning for an infectious etiology. 2.4 x 3.5 x 9.3 cm fluid collection at the incision site with a narrow neck extending to the anterior aspect of the joint. 2. Severe edema of the left iliacus muscle concerning for myositis. Small amount of fluid in the iliopsoas bursa which may be infectious or inflammatory.   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  We will continue antibiotics for  now pending her evaluation at [facility] in [place].  Anticipate another course of IV antibiotics via PICC line at the time of her surgery.  Will recheck labs today and follow-up in 4 weeks.  Refills sent for cefadroxil 1000 mg twice daily to her pharmacy.   Orders Placed This Encounter  Procedures   CBC   Sedimentation rate   C-reactive protein   COMPLETE METABOLIC PANEL WITH GFR        [facility] 03/28/2021, 4:59 PM  I spent 40 minutes dedicated to the care of this patient on the date of this encounter to include pre-visit review of records, face-to-face time with the patient discussing left hip infection and surgical course, and post-visit ordering of testing.

## 2021-03-29 ENCOUNTER — Telehealth: Payer: Self-pay

## 2021-03-29 ENCOUNTER — Ambulatory Visit: Payer: PPO | Admitting: Cardiology

## 2021-03-29 DIAGNOSIS — G894 Chronic pain syndrome: Secondary | ICD-10-CM | POA: Diagnosis not present

## 2021-03-29 DIAGNOSIS — Z79899 Other long term (current) drug therapy: Secondary | ICD-10-CM | POA: Diagnosis not present

## 2021-03-29 DIAGNOSIS — Z0000 Encounter for general adult medical examination without abnormal findings: Secondary | ICD-10-CM | POA: Diagnosis not present

## 2021-03-29 DIAGNOSIS — Z6841 Body Mass Index (BMI) 40.0 and over, adult: Secondary | ICD-10-CM | POA: Diagnosis not present

## 2021-03-29 DIAGNOSIS — M25552 Pain in left hip: Secondary | ICD-10-CM | POA: Diagnosis not present

## 2021-03-29 LAB — COMPLETE METABOLIC PANEL WITH GFR
AG Ratio: 1 (calc) (ref 1.0–2.5)
ALT: 8 U/L (ref 6–29)
AST: 12 U/L (ref 10–35)
Albumin: 3.6 g/dL (ref 3.6–5.1)
Alkaline phosphatase (APISO): 89 U/L (ref 37–153)
BUN: 15 mg/dL (ref 7–25)
CO2: 29 mmol/L (ref 20–32)
Calcium: 9.2 mg/dL (ref 8.6–10.4)
Chloride: 101 mmol/L (ref 98–110)
Creat: 0.57 mg/dL (ref 0.50–1.05)
Globulin: 3.6 g/dL (calc) (ref 1.9–3.7)
Glucose, Bld: 88 mg/dL (ref 65–99)
Potassium: 4.2 mmol/L (ref 3.5–5.3)
Sodium: 139 mmol/L (ref 135–146)
Total Bilirubin: 0.2 mg/dL (ref 0.2–1.2)
Total Protein: 7.2 g/dL (ref 6.1–8.1)
eGFR: 100 mL/min/{1.73_m2} (ref >=60)

## 2021-03-29 LAB — SEDIMENTATION RATE: Sed Rate: 106 mm/h — ABNORMAL HIGH (ref 0–30)

## 2021-03-29 LAB — CBC
HCT: 34.1 % — ABNORMAL LOW (ref 35.0–45.0)
Hemoglobin: 11.3 g/dL — ABNORMAL LOW (ref 11.7–15.5)
MCH: 29 pg (ref 27.0–33.0)
MCHC: 33.1 g/dL (ref 32.0–36.0)
MCV: 87.7 fL (ref 80.0–100.0)
MPV: 10 fL (ref 7.5–12.5)
Platelets: 386 10*3/uL (ref 140–400)
RBC: 3.89 10*6/uL (ref 3.80–5.10)
RDW: 13.9 % (ref 11.0–15.0)
WBC: 9 10*3/uL (ref 3.8–10.8)

## 2021-03-29 LAB — C-REACTIVE PROTEIN: CRP: 77.2 mg/L — ABNORMAL HIGH (ref <8.0)

## 2021-03-29 NOTE — Telephone Encounter (Signed)
Attempted to contact patient, no answer, left HIPPA compliant voicemail to return call to office.  Sharon [name]

## 2021-03-29 NOTE — Telephone Encounter (Signed)
-----   Message from Mignon Pine, DO sent at 03/29/2021  1:15 PM EDT ----- Please let pt know that her inflammatory markers are elevated as anticipated but overall stable from last month.  Her CBC and CMP have no new issues/concerns.

## 2021-03-30 ENCOUNTER — Other Ambulatory Visit: Payer: Self-pay | Admitting: Cardiology

## 2021-03-30 ENCOUNTER — Telehealth: Payer: Self-pay

## 2021-03-30 ENCOUNTER — Other Ambulatory Visit: Payer: Self-pay | Admitting: Orthopaedic Surgery

## 2021-03-30 NOTE — Telephone Encounter (Signed)
These were sent on 03/29/21

## 2021-03-30 NOTE — Telephone Encounter (Signed)
Patient retuning call regarding results. I went over the results and she had no questions or concerns at this time.

## 2021-03-30 NOTE — Telephone Encounter (Signed)
[facility] would like OP notes, office notes, and lab results faxed to [phone], attn:Luz for patient's referral.  CB# [phone].  Please advise.  Thank you.

## 2021-04-02 ENCOUNTER — Encounter: Payer: Self-pay | Admitting: Orthopaedic Surgery

## 2021-04-02 ENCOUNTER — Other Ambulatory Visit: Payer: Self-pay | Admitting: Orthopaedic Surgery

## 2021-04-02 MED ORDER — BACLOFEN 10 MG PO TABS: 10.0000 mg | ORAL_TABLET | Freq: Three times a day (TID) | ORAL | 0 refills | Status: DC

## 2021-04-04 NOTE — Telephone Encounter (Signed)
Received call from [place] with [facility] she is waiting on OP notes, labs and ov notes from previous visits to be faxed. These were sent again to [phone]

## 2021-04-05 ENCOUNTER — Ambulatory Visit: Payer: PPO | Admitting: Orthopedic Surgery

## 2021-04-06 ENCOUNTER — Ambulatory Visit: Payer: PPO | Admitting: Family Medicine

## 2021-04-09 ENCOUNTER — Ambulatory Visit: Payer: PPO | Admitting: Family Medicine

## 2021-04-11 ENCOUNTER — Telehealth: Payer: Self-pay | Admitting: Family Medicine

## 2021-04-11 MED ORDER — PANTOPRAZOLE SODIUM 40 MG PO TBEC: DELAYED_RELEASE_TABLET | ORAL | 0 refills | Status: DC

## 2021-04-11 NOTE — Telephone Encounter (Signed)
error 

## 2021-04-11 NOTE — Telephone Encounter (Signed)
Med sent to pharmacy and daughter (Mary-DPR) contacted

## 2021-04-11 NOTE — Telephone Encounter (Signed)
Patient is requesting refill on pantoprazole 40 mg has appointment for medication follow up on 12/2. [facility]

## 2021-04-15 ENCOUNTER — Other Ambulatory Visit: Payer: Self-pay | Admitting: Family Medicine

## 2021-04-15 DIAGNOSIS — I10 Essential (primary) hypertension: Secondary | ICD-10-CM

## 2021-04-17 ENCOUNTER — Other Ambulatory Visit: Payer: Self-pay | Admitting: Orthopaedic Surgery

## 2021-04-23 ENCOUNTER — Ambulatory Visit: Payer: PPO | Admitting: Cardiology

## 2021-04-23 DIAGNOSIS — M25559 Pain in unspecified hip: Secondary | ICD-10-CM | POA: Diagnosis not present

## 2021-04-23 DIAGNOSIS — T8459XA Infection and inflammatory reaction due to other internal joint prosthesis, initial encounter: Secondary | ICD-10-CM | POA: Diagnosis not present

## 2021-04-23 DIAGNOSIS — Z96649 Presence of unspecified artificial hip joint: Secondary | ICD-10-CM | POA: Diagnosis not present

## 2021-04-25 ENCOUNTER — Ambulatory Visit: Payer: PPO | Admitting: Orthopaedic Surgery

## 2021-04-26 ENCOUNTER — Encounter: Payer: Self-pay | Admitting: Cardiology

## 2021-04-26 ENCOUNTER — Ambulatory Visit: Payer: PPO | Admitting: Internal Medicine

## 2021-04-26 DIAGNOSIS — Z6841 Body Mass Index (BMI) 40.0 and over, adult: Secondary | ICD-10-CM | POA: Diagnosis not present

## 2021-04-26 DIAGNOSIS — M25552 Pain in left hip: Secondary | ICD-10-CM | POA: Diagnosis not present

## 2021-04-26 DIAGNOSIS — G894 Chronic pain syndrome: Secondary | ICD-10-CM | POA: Diagnosis not present

## 2021-04-26 DIAGNOSIS — Z79899 Other long term (current) drug therapy: Secondary | ICD-10-CM | POA: Diagnosis not present

## 2021-04-27 ENCOUNTER — Ambulatory Visit (INDEPENDENT_AMBULATORY_CARE_PROVIDER_SITE_OTHER): Payer: PPO | Admitting: Family Medicine

## 2021-04-27 ENCOUNTER — Other Ambulatory Visit: Payer: Self-pay

## 2021-04-27 VITALS — BP 123/75 | HR 68 | Temp 97.1°F | Ht 60.0 in

## 2021-04-27 DIAGNOSIS — K219 Gastro-esophageal reflux disease without esophagitis: Secondary | ICD-10-CM | POA: Diagnosis not present

## 2021-04-27 DIAGNOSIS — D649 Anemia, unspecified: Secondary | ICD-10-CM

## 2021-04-27 DIAGNOSIS — D509 Iron deficiency anemia, unspecified: Secondary | ICD-10-CM | POA: Diagnosis not present

## 2021-04-27 DIAGNOSIS — T8452XD Infection and inflammatory reaction due to internal left hip prosthesis, subsequent encounter: Secondary | ICD-10-CM | POA: Diagnosis not present

## 2021-04-27 DIAGNOSIS — G894 Chronic pain syndrome: Secondary | ICD-10-CM | POA: Diagnosis not present

## 2021-04-27 MED ORDER — ONDANSETRON 8 MG PO TBDP: 8.0000 mg | ORAL_TABLET | Freq: Three times a day (TID) | ORAL | 6 refills | Status: DC | PRN

## 2021-04-27 MED ORDER — PANTOPRAZOLE SODIUM 40 MG PO TBEC: DELAYED_RELEASE_TABLET | ORAL | 3 refills | Status: DC

## 2021-04-27 NOTE — Patient Instructions (Signed)
Follow up annually.  Best of luck with the surgery.  Take care  Dr. Lacinda Axon

## 2021-04-28 DIAGNOSIS — D509 Iron deficiency anemia, unspecified: Secondary | ICD-10-CM | POA: Insufficient documentation

## 2021-04-28 LAB — CBC
Hematocrit: 34.1 % (ref 34.0–46.6)
Hemoglobin: 11 g/dL — ABNORMAL LOW (ref 11.1–15.9)
MCH: 27.2 pg (ref 26.6–33.0)
MCHC: 32.3 g/dL (ref 31.5–35.7)
MCV: 84 fL (ref 79–97)
Platelets: 422 10*3/uL (ref 150–450)
RBC: 4.05 x10E6/uL (ref 3.77–5.28)
RDW: 14.3 % (ref 11.7–15.4)
WBC: 9.4 10*3/uL (ref 3.4–10.8)

## 2021-04-28 LAB — IRON,TIBC AND FERRITIN PANEL
Ferritin: 242 ng/mL — ABNORMAL HIGH (ref 15–150)
Iron Saturation: 10 % — ABNORMAL LOW (ref 15–55)
Iron: 25 ug/dL — ABNORMAL LOW (ref 27–139)
Total Iron Binding Capacity: 239 ug/dL — ABNORMAL LOW (ref 250–450)
UIBC: 214 ug/dL (ref 118–369)

## 2021-04-28 LAB — FOLATE: Folate: 19.5 ng/mL (ref >3.0)

## 2021-04-28 LAB — VITAMIN B12: Vitamin B-12: 1557 pg/mL — ABNORMAL HIGH (ref 232–1245)

## 2021-04-28 NOTE — Assessment & Plan Note (Signed)
Upcoming surgery. On antibiotics and followed by ID.

## 2021-04-28 NOTE — Progress Notes (Signed)
Subjective:  Patient ID: Sharon [name], female    DOB: 1956/04/28  Age: 65 y.o. MRN: [id number]  CC: Chief Complaint  Patient presents with   Establish Care    Patient under pain management for Left hip 3rd surgery Wants vacomycin removed from allergy list    HPI:  65 year old female with current infection associated with left hip prosthesis, asthma, GERD, chronic pain, obesity presents for follow up and to establish care with me.  Infection associated with hip prosthesis  Follows with ID. Currently on antibiotics. She is seeing Dr. Tracie Harrier - [facility]. She is having upcoming surgery.  GERD Stable on Protonix. Needs refill today.  Anemia Hx of bariatric surgery. Anemia noted on recent labs - Hb 11.3. Needs labs to further evaluate anemia.  Chronic pain Associated with left hip issues/infection. Difficulty ambulating. Needs Rx for transfer chair. Has nausea intermittently with pain and chronic narcotics.  Needs refill on Zofran.   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   History of colonic polyps    Gastroesophageal reflux disease 08/08/2015   Obesity 07/14/2013   Status post right knee replacement 06/17/2011 06/17/2013   Status post left knee replacement 02/28/2007 06/17/2013   DDD (degenerative disc disease), lumbosacral 10/06/2012   OA (osteoarthritis) of knee 12/12/2010   Chronic pain 10/24/2010    Social Hx   Social History   Socioeconomic History   Marital status: Divorced    Spouse name: Not on  file   Number of children: Not on file   Years of education: Not on file   Highest education level: Associate degree: occupational, Hotel manager, or vocational program  Occupational History   Occupation: Therapist, sports at [employer] Use   Smoking status: Former    Packs/day: 1.00    Years: 25.00    Pack years: 25.00    Types: Cigarettes    Start date: 08/02/1972    Quit date: 05/27/2006    Years since quitting: 14.9   Smokeless tobacco: Never  Vaping Use   Vaping Use: Never used  Substance and Sexual Activity   Alcohol use: Yes    Alcohol/week: 0.0 standard drinks    Comment: occas.   Drug use: No   Sexual activity: Yes    Birth control/protection: Surgical  Other Topics Concern   Not on file  Social History Narrative   Not on file   Social Determinants of Health   Financial Resource Strain: Medium Risk   Difficulty of Paying Living Expenses: Somewhat hard  Food Insecurity: Food Insecurity Present   Worried About Running Out of Food in the Last Year: Sometimes true   Ran Out of Food in the Last Year: Never true  Transportation Needs: No Transportation Needs   Lack of Transportation (Medical): No   Lack of Transportation (Non-Medical): No  Physical Activity: Insufficiently Active   Days of Exercise per Week: 2 days   Minutes of Exercise per Session: 10 min  Stress: No Stress Concern Present   Feeling of Stress : Not at all  Social Connections: Moderately Integrated   Frequency of Communication  with Friends and Family: More than three times a week   Frequency of Social Gatherings with Friends and Family: Once a week   Attends Religious Services: More than 4 times per year   Active Member of [employer] or Organizations: Yes   Attends Music therapist: More than 4 times per year   Marital Status: Divorced    Review of Systems  Constitutional:  Positive for appetite change and fever.  Musculoskeletal:        Left hip pain.    Objective:  BP 123/75   Pulse 68   Temp  (!) 97.1 F (36.2 C)   Ht 5' (1.524 m)   BMI 41.84 kg/m   BP/Weight 04/27/2021 03/28/2021 7/62/2633  Systolic BP 354 562 563  Diastolic BP 75 81 72  Wt. (Lbs) - - 214.25  BMI 41.84 - 41.84  Some encounter information is confidential and restricted. Go to Review Flowsheets activity to see all data.    Physical Exam Constitutional:      General: She is not in acute distress.    Appearance: She is obese.  HENT:     Head: Normocephalic and atraumatic.  Eyes:     General:        Right eye: No discharge.        Left eye: No discharge.     Conjunctiva/sclera: Conjunctivae normal.  Cardiovascular:     Rate and Rhythm: Normal rate and regular rhythm.  Pulmonary:     Effort: Pulmonary effort is normal.     Breath sounds: Normal breath sounds. No wheezing or rales.  Neurological:     Mental Status: She is alert.  Psychiatric:        Mood and Affect: Mood normal.        Behavior: Behavior normal.    Lab Results  Component Value Date   WBC 9.4 04/27/2021   HGB 11.0 (L) 04/27/2021   HCT 34.1 04/27/2021   PLT 422 04/27/2021   GLUCOSE 88 03/28/2021   CHOL 162 03/05/2016   TRIG 126 03/05/2016   HDL 58 03/05/2016   LDLCALC 79 03/05/2016   ALT 8 03/28/2021   AST 12 03/28/2021   NA 139 03/28/2021   K 4.2 03/28/2021   CL 101 03/28/2021   CREATININE 0.57 03/28/2021   BUN 15 03/28/2021   CO2 29 03/28/2021   TSH 3.100 08/29/2020   INR 0.95 06/13/2011     Assessment & Plan:   Problem List Items Addressed This Visit       Digestive   Gastroesophageal reflux disease    Stable on Protonix. Refilled today.      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 On antibiotics and followed by ID.        Other   Chronic pain    Zofran refilled.  Patient follows  with Donovan Kail @ [facility] for pain management.      Relevant Medications   oxyCODONE (ROXICODONE) 15 MG immediate release tablet   Iron deficiency anemia    Labs revealed iron deficiency anemia.  Unlikely that patient will get sufficient absorption given prior bariatric history.  Will need IV Iron. Will place referral.       Relevant Orders   CBC (Completed)   Iron, TIBC and Ferritin Panel (Completed)  Folate (Completed)   Vitamin B12 (Completed)   Ambulatory referral to Hematology / Oncology    Meds ordered this encounter  Medications   ondansetron (ZOFRAN-ODT) 8 MG disintegrating tablet    Sig: Take 1 tablet (8 mg total) by mouth every 8 (eight) hours as needed for nausea or vomiting.    Dispense:  30 tablet    Refill:  6   pantoprazole (PROTONIX) 40 MG tablet    Sig: TAKE 1 TABLET BY MOUTH EVERY DAY FOR REFLUX    Dispense:  90 tablet    Refill:  3    Follow-up:  Annually  Thersa Salt DO [place]

## 2021-04-28 NOTE — Assessment & Plan Note (Signed)
Stable on Protonix.  Refilled today. 

## 2021-04-28 NOTE — Assessment & Plan Note (Signed)
Zofran refilled.  Patient follows with Donovan Kail @ [facility] for pain management.

## 2021-04-28 NOTE — Assessment & Plan Note (Signed)
Labs revealed iron deficiency anemia.  Unlikely that patient will get sufficient absorption given prior bariatric history.  Will need IV Iron. Will place referral.

## 2021-05-01 ENCOUNTER — Inpatient Hospital Stay (HOSPITAL_COMMUNITY): Payer: PPO | Attending: Hematology | Admitting: Hematology

## 2021-05-01 ENCOUNTER — Encounter (HOSPITAL_COMMUNITY): Payer: Self-pay | Admitting: Hematology

## 2021-05-01 ENCOUNTER — Other Ambulatory Visit: Payer: Self-pay

## 2021-05-01 VITALS — BP 137/71 | HR 71 | Temp 99.0°F | Resp 18 | Wt 184.8 lb

## 2021-05-01 DIAGNOSIS — D509 Iron deficiency anemia, unspecified: Secondary | ICD-10-CM | POA: Insufficient documentation

## 2021-05-01 LAB — RETICULOCYTES
Immature Retic Fract: 22.2 % — ABNORMAL HIGH (ref 2.3–15.9)
RBC.: 4.01 MIL/uL (ref 3.87–5.11)
Retic Count, Absolute: 70.6 10*3/uL (ref 19.0–186.0)
Retic Ct Pct: 1.8 % (ref 0.4–3.1)

## 2021-05-01 LAB — LACTATE DEHYDROGENASE: LDH: 122 U/L (ref 98–192)

## 2021-05-01 NOTE — Progress Notes (Signed)
[facility] [address] [place] [address]   CLINIC:  Medical Oncology/Hematology  Patient Care Team: Coral Spikes, DO as PCP - General (Family Medicine) Derek Jack, MD as Medical Oncologist (Hematology)  CHIEF COMPLAINTS/PURPOSE OF CONSULTATION:  Evaluation of IDA  HISTORY OF PRESENTING ILLNESS:  Sharon [name] 65 y.o. female is here because of evaluation of IDA, at the request of [facility].  Today she reports feeling fair, and she is accompanied by two of her daughters. She reports November 2021 she had a total left hip arthroplasty which became infected in August 2022 at which time she underwent irrigation and debridement on 08/09. She underwent gastric sleeve resection in March 03/33/2021 since which she has lost over 100 pounds. She has had no appetite since she was started on antibiotics in August 2022; she has been on antibiotics ever since. She denies history of blood transfusions and iron infusions. She reports severe fatigue. She reports dark stools since starting bariatric multivitamin which contains iron, but she denies hematuria and hematochezia. She denies ice pica.   She currently lives at home with her daughter. 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 Her daughter has anemia for which she has received iron infusions, but she has not required blood transfusions. Her mother had cervical cancer, her father had lung cancer, and her paternal aunt had breast cancer.   MEDICAL HISTORY:  Past Medical History:  Diagnosis Date   Anginal pain ([place])    in February 2725   Biliary colic    Cancer ([place])    melanoma: lt. inner thight   Carpal tunnel syndrome    bilateral   Chronic allergic rhinitis    Chronic urticaria    Complication of anesthesia    confusion for a couple of days after waking after having multiple knee surgery's, does well with spinal   Degenerative disk disease    Diverticulosis of colon    Dyspnea    Dysrhythmia     February 2022   Fatty liver    Fibrocystic breast disease 1996   GERD (gastroesophageal reflux disease)    History of colon polyps    HYPERPLASTIC 09-14-2015   History of kidney stones    passed on own   History of melanoma excision    left thigh ,inside 2009--- per pt also has had severeal excision of office for almost melanoma   Hypertension    Migraine    since age 47 not as frequent now   Moderate persistent asthma, uncomplicated    followed by dr gallagher (cone allergy and asthma)   OA (osteoarthritis)    all joints   SUI (stress urinary incontinence, female)     SURGICAL HISTORY: Past Surgical History:  Procedure Laterality Date   ANTERIOR HIP REVISION Left 01/02/2021   Procedure: IRRIGATION AND DEBRIDEMENT LEFT HIP WITH HIP BALL AND POLY ETHYLENE LINER EXCHANGE.;  Surgeon: Mcarthur Rossetti, MD;  Location: [facility];  Service: Orthopedics;  Laterality: Left;  needs RNFA   APPENDECTOMY  3664   APPLICATION OF WOUND VAC Left 01/02/2021   Procedure: APPLICATION OF WOUND VAC;  Surgeon: Mcarthur Rossetti, MD;  Location: [facility];  Service: Orthopedics;  Laterality: Left;   BREAST EXCISIONAL BIOPSY Right 1994;  10-01-2009   2011 per path mild ductal hyperplasia/ fibrocystic breast   BREAST SURGERY     CHOLECYSTECTOMY N/A 01/30/2017   Procedure: LAPAROSCOPIC CHOLECYSTECTOMY;  Surgeon: Kieth Brightly Arta Bruce, MD;  Location: [facility];  Service: General;  Laterality: N/A;   COLONOSCOPY N/A 09/14/2015   Procedure: COLONOSCOPY;  Surgeon: Daneil Dolin, MD;  [facility];  Service: Endoscopy;  Laterality: N/A;  930 - moved to 9:15 - office to notify   ESOPHAGOGASTRODUODENOSCOPY (EGD) WITH PROPOFOL N/A 11/29/2016   Procedure: ESOPHAGOGASTRODUODENOSCOPY (EGD) WITH PROPOFOL;  Surgeon: Doran Stabler, MD;  [facility];  Service: Gastroenterology;  Laterality: N/A;   JOINT REPLACEMENT     knee replacement and left hip replacement   KNEE  ARTHROSCOPY W/ MENISCECTOMY Bilateral left 12-19-2006;  right 01-26-2010   LAPAROSCOPIC GASTRIC SLEEVE RESECTION N/A 08/16/2019   Procedure: LAPAROSCOPIC GASTRIC SLEEVE RESECTION, Upper Endo, ERAS Pathway;  Surgeon: Kinsinger, Arta Bruce, MD;  Location: [facility];  Service: General;  Laterality: N/A;   [place] Left 04/14/2020   Procedure: LEFT TOTAL HIP ARTHROPLASTY ANTERIOR APPROACH;  Surgeon: Mcarthur Rossetti, MD;  [facility];  Service: Orthopedics;  Laterality: Left;   TOTAL KNEE ARTHROPLASTY Left 03/10/2007   TOTAL KNEE ARTHROPLASTY  06/17/2011   Procedure: TOTAL KNEE ARTHROPLASTY;  Surgeon: Arther Abbott, MD;  [facility]: [facility];  Service: Orthopedics;  Laterality: Right;  [place]   TOTAL KNEE REVISION Left 07/08/2010  (spinal anesthesia)   TUBAL LIGATION Bilateral YRS AGO   VAGINAL HYSTERECTOMY  1988    SOCIAL HISTORY: Social History   Socioeconomic History   Marital status: Divorced    Spouse name: Not on file   Number of children: Not on file   Years of education: Not on file   Highest education level: Associate degree: occupational, Hotel manager, or vocational program  Occupational History   Occupation: Therapist, 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   Drug use: No   Sexual activity: Yes    Birth control/protection: Surgical  Other Topics Concern   Not on file  Social History Narrative   Not on file   Social Determinants of Health   Financial Resource Strain: Medium Risk   Difficulty of Paying Living Expenses: Somewhat hard  Food Insecurity: Food Insecurity Present   Worried About Running Out  of Food in the Last Year: Sometimes true   Ran Out of Food in the Last Year: Never true  Transportation Needs: No Transportation Needs   Lack of Transportation (Medical): No   Lack of Transportation (Non-Medical): No  Physical Activity: Insufficiently Active   Days of Exercise per Week: 2 days   Minutes of Exercise per Session: 10 min  Stress: No Stress Concern Present   Feeling of Stress : Not at all  Social Connections: Moderately Integrated   Frequency of Communication with Friends and Family: More than three times a week   Frequency of Social Gatherings with Friends and Family: Once a week   Attends Religious Services: More than 4 times per year   Active Member of [employer] or Organizations: Yes   Attends Music therapist: More than 4 times per year   Marital Status: Divorced  Human resources officer Violence:  Not on file    FAMILY HISTORY: Family History  Problem Relation Age of Onset   Hypertension Father    Lung cancer Father 89       lung cancer   Lung disease Other        family history    Allergic rhinitis Daughter    Food Allergy Grandchild    Anesthesia problems Neg Hx    Hypotension Neg Hx    Malignant hyperthermia Neg Hx    Pseudochol deficiency Neg Hx    Colon cancer Neg Hx    Angioedema Neg Hx    Asthma Neg Hx    Eczema Neg Hx    Immunodeficiency Neg Hx    Urticaria Neg Hx     ALLERGIES:  is allergic to other, adhesive [tape], latex, nickel, vancomycin, augmentin [amoxicillin-pot clavulanate], and neomycin-bacitracin zn-polymyx.  MEDICATIONS:  Current Outpatient Medications  Medication Sig Dispense Refill   baclofen (LIORESAL) 10 MG tablet TAKE 1 TABLET(10 MG) BY MOUTH THREE TIMES DAILY 60 tablet 2   Calcium-Vitamin D-Vitamin K (VIACTIV CALCIUM PLUS D) 650-12.5-40 MG-MCG-MCG CHEW Chew 1 tablet by mouth in the morning, at noon, and at bedtime.     cefadroxil (DURICEF) 500 MG capsule Take 2 capsules (1,000 mg total) by mouth 2 (two) times daily. 168  capsule 0   diazepam (VALIUM) 5 MG tablet Take 5 mg by mouth once.     fexofenadine (ALLEGRA) 180 MG tablet Take 180 mg by mouth in the morning.     fluticasone (FLONASE) 50 MCG/ACT nasal spray Place 2 sprays into both nostrils at bedtime.     gabapentin (NEURONTIN) 300 MG capsule Take 1 capsule (300 mg total) by mouth 2 (two) times daily. 60 capsule 3   Lidocaine HCl-Benzyl Alcohol (SALONPAS LIDOCAINE PLUS EX) Place 1 patch onto the skin every 8 (eight) hours as needed (pain.).     metoprolol tartrate (LOPRESSOR) 25 MG tablet TAKE 1 TABLET(25 MG) BY MOUTH TWICE DAILY 180 tablet 1   Multiple Vitamins-Minerals (BARIATRIC MULTIVITAMINS/IRON PO) Take 1 tablet by mouth in the morning.     oxyCODONE (ROXICODONE) 15 MG immediate release tablet Take 15 mg by mouth 3 (three) times daily as needed.     pantoprazole (PROTONIX) 40 MG tablet TAKE 1 TABLET BY MOUTH EVERY DAY FOR REFLUX 90 tablet 3   albuterol (PROVENTIL HFA;VENTOLIN HFA) 108 (90 Base) MCG/ACT inhaler Inhale 2 puffs into the lungs every 4 (four) hours as needed for wheezing or shortness of breath. (Patient not taking: Reported on 05/01/2021) 1 Inhaler 2   albuterol (PROVENTIL) (2.5 MG/3ML) 0.083% nebulizer solution USE ONE VIAL IN NEBULIZER EVERY 4 HOURS AS NEEDED FOR WHEEZING (Patient not taking: Reported on 05/01/2021) 50 vial 2   docusate sodium (COLACE) 100 MG capsule Take 100 mg by mouth 2 (two) times daily as needed for mild constipation. (Patient not taking: Reported on 05/01/2021)     methocarbamol (ROBAXIN) 500 MG tablet TAKE 1 TABLET(500 MG) BY MOUTH EVERY 8 HOURS AS NEEDED FOR MUSCLE SPASMS (Patient not taking: Reported on 05/01/2021) 40 tablet 1   ondansetron (ZOFRAN-ODT) 8 MG disintegrating tablet Take 1 tablet (8 mg total) by mouth every 8 (eight) hours as needed for nausea or vomiting. (Patient not taking: Reported on 05/01/2021) 30 tablet 6   No current facility-administered medications for this visit.    REVIEW OF SYSTEMS:   Review  of Systems  Constitutional:  Positive for appetite change (0%) and fatigue (depleted).  Respiratory:  Positive for cough  and shortness of breath.   Gastrointestinal:  Positive for constipation and nausea. Negative for blood in stool.  Genitourinary:  Positive for bladder incontinence. Negative for hematuria.   Musculoskeletal:  Positive for arthralgias (8/10 L hip).  Neurological:  Positive for dizziness and headaches.  Psychiatric/Behavioral:  Positive for depression and sleep disturbance. The patient is nervous/anxious.   All other systems reviewed and are negative.   PHYSICAL EXAMINATION: ECOG PERFORMANCE STATUS: 2 - Symptomatic, <50% confined to bed  Vitals:   05/01/21 1522  BP: 137/71  Pulse: 71  Resp: 18  Temp: 99 F (37.2 C)  SpO2: 93%   Filed Weights   05/01/21 1522  Weight: 184 lb 12.8 oz (83.8 kg)   Physical Exam Vitals reviewed.  Constitutional:      Appearance: Normal appearance. She is obese.     Comments: In wheelchair  Cardiovascular:     Rate and Rhythm: Normal rate and regular rhythm.     Pulses: Normal pulses.     Heart sounds: Normal heart sounds.  Pulmonary:     Effort: Pulmonary effort is normal.     Breath sounds: Normal breath sounds.  Musculoskeletal:     Right lower leg: No edema.     Left lower leg: No edema.  Neurological:     General: No focal deficit present.     Mental Status: She is alert and oriented to person, place, and time.  Psychiatric:        Mood and Affect: Mood normal.        Behavior: Behavior normal.     LABORATORY DATA:  I have reviewed the data as listed Recent Results (from the past 2160 hour(s))  CBC with Differential     Status: Abnormal   Collection Time: 02/28/21  3:38 PM  Result Value Ref Range   WBC 8.7 3.8 - 10.8 Thousand/uL   RBC 4.15 3.80 - 5.10 Million/uL   Hemoglobin 12.2 11.7 - 15.5 g/dL   HCT 37.2 35.0 - 45.0 %   MCV 89.6 80.0 - 100.0 fL   MCH 29.4 27.0 - 33.0 pg   MCHC 32.8 32.0 - 36.0 g/dL    RDW 13.9 11.0 - 15.0 %   Platelets 438 (H) 140 - 400 Thousand/uL   MPV 10.3 7.5 - 12.5 fL   Neutro Abs 5,037 1,500 - 7,800 cells/uL   Lymphs Abs 2,393 850 - 3,900 cells/uL   Absolute Monocytes 940 200 - 950 cells/uL   Eosinophils Absolute 261 15 - 500 cells/uL   Basophils Absolute 70 0 - 200 cells/uL   Neutrophils Relative % 57.9 %   Total Lymphocyte 27.5 %   Monocytes Relative 10.8 %   Eosinophils Relative 3.0 %   Basophils Relative 0.8 %  Sed Rate (ESR)     Status: Abnormal   Collection Time: 02/28/21  3:38 PM  Result Value Ref Range   Sed Rate 103 (H) 0 - 30 mm/h  C-reactive protein     Status: Abnormal   Collection Time: 02/28/21  3:38 PM  Result Value Ref Range   CRP 140.7 (H) <8.0 mg/L  CBC     Status: Abnormal   Collection Time: 03/28/21  4:36 PM  Result Value Ref Range   WBC 9.0 3.8 - 10.8 Thousand/uL   RBC 3.89 3.80 - 5.10 Million/uL   Hemoglobin 11.3 (L) 11.7 - 15.5 g/dL   HCT 34.1 (L) 35.0 - 45.0 %   MCV 87.7 80.0 - 100.0 fL   MCH 29.0  27.0 - 33.0 pg   MCHC 33.1 32.0 - 36.0 g/dL   RDW 13.9 11.0 - 15.0 %   Platelets 386 140 - 400 Thousand/uL   MPV 10.0 7.5 - 12.5 fL  Sedimentation rate     Status: Abnormal   Collection Time: 03/28/21  4:36 PM  Result Value Ref Range   Sed Rate 106 (H) 0 - 30 mm/h  C-reactive protein     Status: Abnormal   Collection Time: 03/28/21  4:36 PM  Result Value Ref Range   CRP 77.2 (H) <8.0 mg/L  COMPLETE METABOLIC PANEL WITH GFR     Status: None   Collection Time: 03/28/21  4:36 PM  Result Value Ref Range   Glucose, Bld 88 65 - 99 mg/dL    Comment: .            Fasting reference interval .    BUN 15 7 - 25 mg/dL   Creat 0.57 0.50 - 1.05 mg/dL   eGFR 100 > OR = 60 mL/min/1.34m    Comment: The eGFR is based on the CKD-EPI 2021 equation. To calculate  the new eGFR from a previous Creatinine or Cystatin C result, go to https://www.kidney.org/professionals/ kdoqi/gfr%5Fcalculator    BUN/Creatinine Ratio NOT APPLICABLE 6 - 22  (calc)   Sodium 139 135 - 146 mmol/L   Potassium 4.2 3.5 - 5.3 mmol/L   Chloride 101 98 - 110 mmol/L   CO2 29 20 - 32 mmol/L   Calcium 9.2 8.6 - 10.4 mg/dL   Total Protein 7.2 6.1 - 8.1 g/dL   Albumin 3.6 3.6 - 5.1 g/dL   Globulin 3.6 1.9 - 3.7 g/dL (calc)   AG Ratio 1.0 1.0 - 2.5 (calc)   Total Bilirubin 0.2 0.2 - 1.2 mg/dL   Alkaline phosphatase (APISO) 89 37 - 153 U/L   AST 12 10 - 35 U/L   ALT 8 6 - 29 U/L  CBC     Status: Abnormal   Collection Time: 04/27/21  3:07 PM  Result Value Ref Range   WBC 9.4 3.4 - 10.8 x10E3/uL   RBC 4.05 3.77 - 5.28 x10E6/uL   Hemoglobin 11.0 (L) 11.1 - 15.9 g/dL   Hematocrit 34.1 34.0 - 46.6 %   MCV 84 79 - 97 fL   MCH 27.2 26.6 - 33.0 pg   MCHC 32.3 31.5 - 35.7 g/dL   RDW 14.3 11.7 - 15.4 %   Platelets 422 150 - 450 x10E3/uL  Iron, TIBC and Ferritin Panel     Status: Abnormal   Collection Time: 04/27/21  3:07 PM  Result Value Ref Range   Total Iron Binding Capacity 239 (L) 250 - 450 ug/dL   UIBC 214 118 - 369 ug/dL   Iron 25 (L) 27 - 139 ug/dL   Iron Saturation 10 (L) 15 - 55 %   Ferritin 242 (H) 15 - 150 ng/mL  Folate     Status: None   Collection Time: 04/27/21  3:07 PM  Result Value Ref Range   Folate 19.5 >3.0 ng/mL    Comment: A serum folate concentration of less than 3.1 ng/mL is considered to represent clinical deficiency.   Vitamin B12     Status: Abnormal   Collection Time: 04/27/21  3:07 PM  Result Value Ref Range   Vitamin B-12 1,557 (H) 232 - 1,245 pg/mL    RADIOGRAPHIC STUDIES: I have personally reviewed the radiological images as listed and agreed with the findings in the report. No results  found.  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 Lacinda Axon for anemia. - She had bariatric sleeve surgery done in March 2021.  Last 100 pounds since surgery. - She had left hip replaced in November 2021 which got infected.  She had to have debridement in August 2022.  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 - She denies any bleeding per rectum.  She has dark stools since she has been taking vitamins.  No prior history of blood transfusion. - Labs on 04/27/2021 showed hemoglobin 11.  B12 is 1557.  MCV is 84.  CRP 77.  Ferritin is 242 and percent saturation is 10. - Colonoscopy on 09/14/2015 with moderate diverticulosis in the colon.  5 mm polyp in the descending colon. - EGD showed normal esophagus, gastritis.   Social/family history: - She lives by herself at home.  Currently daughter is living with her and helping her. 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 - Mother had cervical cancer.  Father had lung cancer.  Paternal aunt had breast cancer.   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 - She has very high CRP secondary to infection/inflammation.  In the setting of inflammation, ferritin and B12 can be high as they are inflammatory markers.  However percent saturation is low at 10. - She will benefit from parenteral iron therapy.  We talked about Feraheme weekly x2.  We talked about side effects in detail. - We will also check for other causes of anemia including methylmalonic acid, copper, LDH, reticulocyte count.  We will also check SPEP.  We will check stool for occult blood. - RTC 2 months for follow-up with repeat CBC, ferritin and iron panel.   All questions were answered. The patient knows to call the clinic with any problems, questions or concerns.  Derek Jack, MD 05/01/21 3:49 PM  [place] [phone]   I, Thana Ates, am acting as a scribe for Dr. Derek Jack.  I, Derek Jack MD, have reviewed the above documentation for accuracy and completeness, and I agree with the above.

## 2021-05-01 NOTE — Patient Instructions (Addendum)
[facility] at [facility] Discharge Instructions  You were seen and examined today by Dr. Delton Coombes. Dr. Delton Coombes is a hematologist, meaning that he specializes in blood disorders. Dr. Delton Coombes discussed your past medical history, family history of blood disorders/cancers, and the events that led to you being here today.  You were referred to Dr. Delton Coombes due to anemia. Dr. Delton Coombes has recommended additional lab work today to rule out any causes of your anemia. It is possible that your blood counts are being impacted due to the inflammation present in your body.  Dr. Delton Coombes will arrange for iron infusions.  Follow-up as scheduled.   Thank you for choosing [place] at [facility] to provide your oncology and hematology care.  To afford each patient quality time with our provider, please arrive at least 15 minutes before your scheduled appointment time.   If you have a lab appointment with the [facility] please come in thru the Main Entrance and check in at the main information desk.  You need to re-schedule your appointment should you arrive 10 or more minutes late.  We strive to give you quality time with our providers, and arriving late affects you and other patients whose appointments are after yours.  Also, if you no show three or more times for appointments you may be dismissed from the clinic at the providers discretion.     Again, thank you for choosing [facility].  Our hope is that these requests will decrease the amount of time that you wait before being seen by our physicians.       _____________________________________________________________  Should you have questions after your visit to [facility], please contact our office at [phone] and follow the prompts.  Our office hours are 8:00 a.m. and 4:30 p.m. Monday - Friday.  Please note that voicemails left after 4:00 p.m. may not be  returned until the following business day.  We are closed weekends and major holidays.  You do have access to a nurse 24-7, just call the main number to the clinic [phone] and do not press any options, hold on the line and a nurse will answer the phone.    For prescription refill requests, have your pharmacy contact our office and allow 72 hours.    Due to Covid, you will need to wear a mask upon entering the hospital. If you do not have a mask, a mask will be given to you at the Main Entrance upon arrival. For doctor visits, patients may have 1 support person age 42 or older with them. For treatment visits, patients can not have anyone with them due to social distancing guidelines and our immunocompromised population.

## 2021-05-03 ENCOUNTER — Other Ambulatory Visit: Payer: Self-pay

## 2021-05-03 ENCOUNTER — Inpatient Hospital Stay (HOSPITAL_COMMUNITY): Payer: PPO

## 2021-05-03 VITALS — BP 127/53 | HR 57 | Temp 98.3°F | Resp 18

## 2021-05-03 DIAGNOSIS — D509 Iron deficiency anemia, unspecified: Secondary | ICD-10-CM

## 2021-05-03 LAB — PROTEIN ELECTROPHORESIS, SERUM
A/G Ratio: 0.7 (ref 0.7–1.7)
Albumin ELP: 2.8 g/dL — ABNORMAL LOW (ref 2.9–4.4)
Alpha-1-Globulin: 0.3 g/dL (ref 0.0–0.4)
Alpha-2-Globulin: 1.1 g/dL — ABNORMAL HIGH (ref 0.4–1.0)
Beta Globulin: 1.2 g/dL (ref 0.7–1.3)
Gamma Globulin: 1.7 g/dL (ref 0.4–1.8)
Globulin, Total: 4.3 g/dL — ABNORMAL HIGH (ref 2.2–3.9)
Total Protein ELP: 7.1 g/dL (ref 6.0–8.5)

## 2021-05-03 LAB — METHYLMALONIC ACID, SERUM: Methylmalonic Acid, Quantitative: 199 nmol/L (ref 0–378)

## 2021-05-03 MED ORDER — SODIUM CHLORIDE 0.9 % IV SOLN
Freq: Once | INTRAVENOUS | Status: AC
Start: 2021-05-03 — End: 2021-05-03

## 2021-05-03 MED ORDER — SODIUM CHLORIDE 0.9 % IV SOLN
510.0000 mg | Freq: Once | INTRAVENOUS | Status: AC
  Administered 2021-05-03: 510 mg via INTRAVENOUS
  Filled 2021-05-03: qty 510

## 2021-05-03 MED ORDER — LORATADINE 10 MG PO TABS
10.0000 mg | ORAL_TABLET | Freq: Once | ORAL | Status: AC
  Administered 2021-05-03: 10 mg via ORAL
  Filled 2021-05-03: qty 1

## 2021-05-03 MED ORDER — ACETAMINOPHEN 325 MG PO TABS
650.0000 mg | ORAL_TABLET | Freq: Once | ORAL | Status: AC
  Administered 2021-05-03: 650 mg via ORAL
  Filled 2021-05-03: qty 2

## 2021-05-03 NOTE — Patient Instructions (Signed)
[facility]  Discharge Instructions: Thank you for choosing [facility] to provide your oncology and hematology care.  If you have a lab appointment with the Cancer Center, please come in thru the Main Entrance and check in at the main information desk.  Wear comfortable clothing and clothing appropriate for easy access to any Portacath or PICC line.   We strive to give you quality time with your provider. You may need to reschedule your appointment if you arrive late (15 or more minutes).  Arriving late affects you and other patients whose appointments are after yours.  Also, if you miss three or more appointments without notifying the office, you may be dismissed from the clinic at the provider's discretion.      For prescription refill requests, have your pharmacy contact our office and allow 72 hours for refills to be completed.        To help prevent nausea and vomiting after your treatment, we encourage you to take your nausea medication as directed.  BELOW ARE SYMPTOMS THAT SHOULD BE REPORTED IMMEDIATELY: *FEVER GREATER THAN 100.4 F (38 C) OR HIGHER *CHILLS OR SWEATING *NAUSEA AND VOMITING THAT IS NOT CONTROLLED WITH YOUR NAUSEA MEDICATION *UNUSUAL SHORTNESS OF BREATH *UNUSUAL BRUISING OR BLEEDING *URINARY PROBLEMS (pain or burning when urinating, or frequent urination) *BOWEL PROBLEMS (unusual diarrhea, constipation, pain near the anus) TENDERNESS IN MOUTH AND THROAT WITH OR WITHOUT PRESENCE OF ULCERS (sore throat, sores in mouth, or a toothache) UNUSUAL RASH, SWELLING OR PAIN  UNUSUAL VAGINAL DISCHARGE OR ITCHING   Items with * indicate a potential emergency and should be followed up as soon as possible or go to the Emergency Department if any problems should occur.  Please show the CHEMOTHERAPY ALERT CARD or IMMUNOTHERAPY ALERT CARD at check-in to the Emergency Department and triage nurse.  Should you have questions after your visit or need to cancel  or reschedule your appointment, please contact [facility] [phone]  and follow the prompts.  Office hours are 8:00 a.m. to 4:30 p.m. Monday - Friday. Please note that voicemails left after 4:00 p.m. may not be returned until the following business day.  We are closed weekends and major holidays. You have access to a nurse at all times for urgent questions. Please call the main number to the clinic [phone] and follow the prompts.  For any non-urgent questions, you may also contact your provider using MyChart. We now offer e-Visits for anyone 18 and older to request care online for non-urgent symptoms. For details visit [email or web address].   Also download the MyChart app! Go to the app store, search "MyChart", open the app, select [facility], and log in with your MyChart username and password.  Due to Covid, a mask is required upon entering the hospital/clinic. If you do not have a mask, one will be given to you upon arrival. For doctor visits, patients may have 1 support person aged 18 or older with them. For treatment visits, patients cannot have anyone with them due to current Covid guidelines and our immunocompromised population.  

## 2021-05-03 NOTE — Progress Notes (Signed)
Treatment given per orders. Patient tolerated it well without problems. Vitals stable and discharged home from clinic via wheelchair Follow up as scheduled.  

## 2021-05-04 ENCOUNTER — Other Ambulatory Visit: Payer: Self-pay | Admitting: Internal Medicine

## 2021-05-04 DIAGNOSIS — T8452XD Infection and inflammatory reaction due to internal left hip prosthesis, subsequent encounter: Secondary | ICD-10-CM

## 2021-05-04 LAB — COPPER, SERUM: Copper: 228 ug/dL — ABNORMAL HIGH (ref 80–158)

## 2021-05-07 ENCOUNTER — Other Ambulatory Visit (HOSPITAL_COMMUNITY): Payer: Self-pay

## 2021-05-07 DIAGNOSIS — D509 Iron deficiency anemia, unspecified: Secondary | ICD-10-CM | POA: Diagnosis not present

## 2021-05-07 LAB — OCCULT BLOOD X 1 CARD TO LAB, STOOL
Fecal Occult Bld: NEGATIVE
Fecal Occult Bld: NEGATIVE
Fecal Occult Bld: NEGATIVE

## 2021-05-07 NOTE — Telephone Encounter (Signed)
Do you want to refill? Patient does not have follow up scheduled.

## 2021-05-07 NOTE — Telephone Encounter (Signed)
Message sent to front desk to schedule appointment per Dr. Juleen China.   Beryle Flock, RN

## 2021-05-07 NOTE — Telephone Encounter (Signed)
Patient is seeing Dr. Carnella [place]. She states they requested that she remain on antibiotics until her surgery, Ortho does not know when surgery will be at this point. Patient unable to come to office for appointment, would you like [place] to set up virtual appointment?   Beryle Flock, RN

## 2021-05-10 ENCOUNTER — Ambulatory Visit (HOSPITAL_COMMUNITY): Payer: PPO

## 2021-05-10 ENCOUNTER — Encounter: Payer: Self-pay | Admitting: Family Medicine

## 2021-05-11 ENCOUNTER — Other Ambulatory Visit: Payer: Self-pay | Admitting: Nurse Practitioner

## 2021-05-11 MED ORDER — PANTOPRAZOLE SODIUM 40 MG PO TBEC: 40.0000 mg | DELAYED_RELEASE_TABLET | Freq: Two times a day (BID) | ORAL | 0 refills | Status: DC

## 2021-05-11 NOTE — Progress Notes (Signed)
See my chart note. A one time Rx for Protonix BID dosing sent in then she is to resume her qd dosing.

## 2021-05-17 ENCOUNTER — Ambulatory Visit: Payer: PPO | Admitting: Cardiology

## 2021-05-24 DIAGNOSIS — Z79899 Other long term (current) drug therapy: Secondary | ICD-10-CM | POA: Diagnosis not present

## 2021-05-24 DIAGNOSIS — M25552 Pain in left hip: Secondary | ICD-10-CM | POA: Diagnosis not present

## 2021-05-24 DIAGNOSIS — Z6841 Body Mass Index (BMI) 40.0 and over, adult: Secondary | ICD-10-CM | POA: Diagnosis not present

## 2021-05-24 DIAGNOSIS — G894 Chronic pain syndrome: Secondary | ICD-10-CM | POA: Diagnosis not present

## 2021-05-29 ENCOUNTER — Telehealth: Payer: Self-pay | Admitting: Cardiology

## 2021-05-29 NOTE — Telephone Encounter (Signed)
I advised with scheduler, I would have to check with MD to advise and we would let them know.   Will route to MD.   Patient to be seen 01/05 at 4:20 PM.  Thank you!

## 2021-05-29 NOTE — Telephone Encounter (Signed)
Daughter called and mentioned that patient is bed bound and wanted to know if patient could be seen virtually.

## 2021-05-30 DIAGNOSIS — R0602 Shortness of breath: Secondary | ICD-10-CM | POA: Insufficient documentation

## 2021-05-30 NOTE — Progress Notes (Deleted)
{Choose 1 Note Type (Video or Telephone):[phone]}    Date:  05/30/2021   ID:  Sharon [name], DOB 19-Jul-1955, MRN [id number] The patient was identified using 2 identifiers.  {Patient Location:[phone]::"Home"} {Provider [phone]::"Home Office"}   PCP:  Coral Spikes, DO   [facility] Providers Cardiologist:  Minus Breeding, MD { Click to update primary MD,subspecialty MD or APP then REFRESH:1}    Evaluation Performed:  {Choose Visit [phone]::"Follow-Up Visit"}  Chief Complaint:  ***  History of Present Illness:    Sharon [name] is a 66 y.o. female who was referred by Sharon Colla, DO for evaluation of palpitations.  She was post Covid.  I did send her for an echo which was essentially normal.  ***   *** She has not had a past cardiac history.  She is a retired Marketing executive.  She never had a cardiac issue.  She had hip surgery and was slowly recovering from this.  She had been walking a mile earlier in the year.  She then developed COVID.  She was not hospitalized but she had fevers, cough, congestion, sore throat, decreased taste and poor appetite.  Following this she started having elevated blood pressures.  These were noted by her home health physical therapist.  She went up to 210/102 at 1 point in time.  She ultimately was started on amlodipine and her pressures have been in the 656C to 127 systolic.  Prior to this she had never had hypertension.  She also noticed increasing palpitations.  I saw her for this recently.     Since I saw her she had to have revision of the hip because of an infection.  She was actually in the hospital for about 8 days.  I reviewed these records.  She was actually very hypotensive when in the hospital and her blood pressure.  Her meds were not changed.    Since going home she has been getting IV antibiotics 3 times a day.  She says when this is completed she has been to be on oral antibiotics for a year!  She says  she is still getting short of breath when lying flat.  Her BNP and echo were normal when I saw her recently.  She feels comfortable lying with her head elevated somewhat.  She is still getting some chest tightness which she described before but this is less than previous and happens when she lies down.  She is doing physical therapy and not bringing on any symptoms with this.  She is very weak.  She is not having chest pressure, neck or arm discomfort.      The patient {does/does not:200015} have symptoms concerning for COVID-19 infection (fever, chills, cough, or new shortness of breath).    Past Medical History:  Diagnosis Date   Anginal pain ([place])    in February 5170   Biliary colic    Cancer ([place])    melanoma: lt. inner thight   Carpal tunnel syndrome    bilateral   Chronic allergic rhinitis    Chronic urticaria    Complication of anesthesia    confusion for a couple of days after waking after having multiple knee surgery's, does well with spinal   Degenerative disk disease    Diverticulosis of colon    Dyspnea    Dysrhythmia    February 2022   Fatty liver    Fibrocystic breast disease 1996   GERD (gastroesophageal reflux disease)    History of colon  polyps    HYPERPLASTIC 09-14-2015   History of kidney stones    passed on own   History of melanoma excision    left thigh ,inside 2009--- per pt also has had severeal excision of office for almost melanoma   Hypertension    Migraine    since age 38 not as frequent now   Moderate persistent asthma, uncomplicated    followed by dr gallagher (cone allergy and asthma)   OA (osteoarthritis)    all joints   SUI (stress urinary incontinence, female)    Past Surgical History:  Procedure Laterality Date   ANTERIOR HIP REVISION Left 01/02/2021   Procedure: IRRIGATION AND DEBRIDEMENT LEFT HIP WITH HIP BALL AND POLY ETHYLENE LINER EXCHANGE.;  Surgeon: Mcarthur Rossetti, MD;  Location: [facility];  Service: Orthopedics;  Laterality:  Left;  needs RNFA   APPENDECTOMY  2841   APPLICATION OF WOUND VAC Left 01/02/2021   Procedure: APPLICATION OF WOUND VAC;  Surgeon: Mcarthur Rossetti, MD;  Location: [facility];  Service: Orthopedics;  Laterality: Left;   BREAST EXCISIONAL BIOPSY Right 1994;  10-01-2009   2011 per path mild ductal hyperplasia/ fibrocystic breast   BREAST SURGERY     CHOLECYSTECTOMY N/A 01/30/2017   Procedure: LAPAROSCOPIC CHOLECYSTECTOMY;  Surgeon: Kieth Brightly Arta Bruce, MD;  Location: [facility];  Service: General;  Laterality: N/A;   COLONOSCOPY N/A 09/14/2015   Procedure: COLONOSCOPY;  Surgeon: Daneil Dolin, MD;  [facility];  Service: Endoscopy;  Laterality: N/A;  930 - moved to 9:15 - office to notify   ESOPHAGOGASTRODUODENOSCOPY (EGD) WITH PROPOFOL N/A 11/29/2016   Procedure: ESOPHAGOGASTRODUODENOSCOPY (EGD) WITH PROPOFOL;  Surgeon: Doran Stabler, MD;  [facility]: [facility];  Service: Gastroenterology;  Laterality: N/A;   JOINT REPLACEMENT     knee replacement and left hip replacement   KNEE ARTHROSCOPY W/ MENISCECTOMY Bilateral left 12-19-2006;  right 01-26-2010   LAPAROSCOPIC GASTRIC SLEEVE RESECTION N/A 08/16/2019   Procedure: LAPAROSCOPIC GASTRIC SLEEVE RESECTION, Upper Endo, ERAS Pathway;  Surgeon: Kinsinger, Arta Bruce, MD;  Location: [facility];  Service: General;  Laterality: N/A;   [place] Left 04/14/2020   Procedure: LEFT TOTAL HIP ARTHROPLASTY ANTERIOR APPROACH;  Surgeon: Mcarthur Rossetti, MD;  Location: [facility];  Service: Orthopedics;  Laterality: Left;   TOTAL KNEE ARTHROPLASTY Left 03/10/2007   TOTAL KNEE ARTHROPLASTY  06/17/2011   Procedure: TOTAL KNEE ARTHROPLASTY;  Surgeon: Arther Abbott, MD;  Location: [facility];  Service: Orthopedics;  Laterality: Right;  [place]   TOTAL KNEE REVISION Left 07/08/2010  (spinal anesthesia)   TUBAL LIGATION Bilateral YRS AGO   VAGINAL HYSTERECTOMY  1988     No outpatient  medications have been marked as taking for the 05/31/21 encounter (Appointment) with Minus Breeding, MD.     Allergies:   Other, Adhesive [tape], Latex, Nickel, Vancomycin, Augmentin [amoxicillin-pot clavulanate], and Neomycin-bacitracin zn-polymyx   Social History   Tobacco Use   Smoking status: Former    Packs/day: 1.00    Years: 25.00    Pack years: 25.00    Types: Cigarettes    Start date: 08/02/1972    Quit date: 05/27/2006    Years since quitting: 15.0   Smokeless tobacco: Never  Vaping Use   Vaping Use: Never used  Substance Use Topics   Alcohol use: Yes    Alcohol/week: 0.0 standard drinks    Comment: occas.   Drug use: No     Family  Hx: The patient's family history includes Allergic rhinitis in her daughter; Food Allergy in her grandchild; Hypertension in her father; Lung cancer (age of onset: 21) in her father; Lung disease in an other family member. There is no history of Anesthesia problems, Hypotension, Malignant hyperthermia, Pseudochol deficiency, Colon cancer, Angioedema, Asthma, Eczema, Immunodeficiency, or Urticaria.  ROS:   Please see the history of present illness.    *** All other systems reviewed and are negative.   Prior CV studies:   The following studies were reviewed today:  ***  Labs/Other Tests and Data Reviewed:    EKG:  {EKG/Telemetry Strips Reviewed:[phone]}  Recent Labs: 08/29/2020: TSH 3.100 10/13/2020: BNP 38.7 03/28/2021: ALT 8; BUN 15; Creat 0.57; Potassium 4.2; Sodium 139 04/27/2021: Hemoglobin 11.0; Platelets 422   Recent Lipid Panel Lab Results  Component Value Date/Time   CHOL 162 03/05/2016 10:41 AM   CHOL 182 10/27/2014 08:46 AM   TRIG 126 03/05/2016 10:41 AM   HDL 58 03/05/2016 10:41 AM   HDL 76 10/27/2014 08:46 AM   CHOLHDL 2.8 03/05/2016 10:41 AM   LDLCALC 79 03/05/2016 10:41 AM   LDLCALC 84 10/27/2014 08:46 AM    Wt Readings from Last 3 Encounters:  05/01/21 184 lb 12.8 oz (83.8 kg)  02/15/21 214 lb 4 oz (97.2 kg)   02/14/21 214 lb (97.1 kg)     Risk Assessment/Calculations:   {Does this patient have ATRIAL FIBRILLATION?:[phone]}      Objective:    Vital Signs:  There were no vitals taken for this visit.   {[facility] Virtual Exam (Optional):[phone]::"VITAL SIGNS:  reviewed"}  ASSESSMENT & PLAN:    Palpitations:   ***  She had SVT on a monitor and this is much improved with low-dose beta-blocker.  No change in therapy.    SOB   BNP was *** normal.  Echo was normal.  No change in therapy.  Her shortness of breath may be related to weakness and deconditioning.  I do not think at this point it is heart failure or an anginal equivalent.    HTN:   *** Her blood pressure is running low since her surgery and we will get rid of the amlodipine.  She will keep an eye on her blood pressure.     Chest pain:  *** This has nonanginal more than anginal qualities.  It seems to be improved.  She and I agree to conservative management and no further testing at this point given her recent events.        {Are you ordering a CV Procedure (e.g. stress test, cath, DCCV, TEE, etc)?   Press F2        :[id number]}    COVID-19 Education: The signs and symptoms of COVID-19 were discussed with the patient and how to seek care for testing (follow up with PCP or arrange E-visit).  ***The importance of social distancing was discussed today.  Time:   Today, I have spent *** minutes with the patient with telehealth technology discussing the above problems.     Medication Adjustments/Labs and Tests Ordered: Current medicines are reviewed at length with the patient today.  Concerns regarding medicines are outlined above.   Tests Ordered: No orders of the defined types were placed in this encounter.   Medication Changes: No orders of the defined types were placed in this encounter.   Follow Up:  {F/U Format:[id number]} {follow up:15908}  Signed, Minus Breeding, MD  05/30/2021 9:38 PM    [facility]

## 2021-05-31 ENCOUNTER — Ambulatory Visit: Payer: PPO | Admitting: Cardiology

## 2021-05-31 DIAGNOSIS — R0602 Shortness of breath: Secondary | ICD-10-CM

## 2021-05-31 DIAGNOSIS — R002 Palpitations: Secondary | ICD-10-CM

## 2021-06-02 ENCOUNTER — Encounter: Payer: Self-pay | Admitting: Family Medicine

## 2021-06-05 ENCOUNTER — Other Ambulatory Visit: Payer: Self-pay | Admitting: Family Medicine

## 2021-06-05 DIAGNOSIS — T8452XD Infection and inflammatory reaction due to internal left hip prosthesis, subsequent encounter: Secondary | ICD-10-CM

## 2021-06-05 MED ORDER — ALBUTEROL SULFATE HFA 108 (90 BASE) MCG/ACT IN AERS: 1.0000 | INHALATION_SPRAY | Freq: Four times a day (QID) | RESPIRATORY_TRACT | 2 refills | Status: DC | PRN

## 2021-06-12 ENCOUNTER — Encounter: Payer: Self-pay | Admitting: Internal Medicine

## 2021-06-12 ENCOUNTER — Telehealth (INDEPENDENT_AMBULATORY_CARE_PROVIDER_SITE_OTHER): Payer: PPO | Admitting: Internal Medicine

## 2021-06-12 ENCOUNTER — Other Ambulatory Visit: Payer: Self-pay

## 2021-06-12 ENCOUNTER — Ambulatory Visit (INDEPENDENT_AMBULATORY_CARE_PROVIDER_SITE_OTHER): Payer: PPO

## 2021-06-12 VITALS — Ht 60.0 in | Wt 209.0 lb

## 2021-06-12 DIAGNOSIS — Z599 Problem related to housing and economic circumstances, unspecified: Secondary | ICD-10-CM | POA: Diagnosis not present

## 2021-06-12 DIAGNOSIS — T8452XD Infection and inflammatory reaction due to internal left hip prosthesis, subsequent encounter: Secondary | ICD-10-CM | POA: Diagnosis not present

## 2021-06-12 DIAGNOSIS — Z0000 Encounter for general adult medical examination without abnormal findings: Secondary | ICD-10-CM | POA: Diagnosis not present

## 2021-06-12 NOTE — Patient Instructions (Signed)
Sharon [name] , Thank you for taking time to come for your Medicare Wellness Visit. I appreciate your ongoing commitment to your health goals. Please review the following plan we discussed and let me know if I can assist you in the future.   Screening recommendations/referrals: Colonoscopy: Done 09/14/2015 Repeat in 10 years  Mammogram: Done 05/22/2020. Repeat annually  Bone Density: Done 10/28/2014 Repeat every 2 years  Recommended yearly ophthalmology/optometry visit for glaucoma screening and checkup Recommended yearly dental visit for hygiene and checkup  Vaccinations: Influenza vaccine: Declined at this. Pneumococcal vaccine: Done 02/24/2010. Tdap vaccine: Done 09/17/2013 Repeat in 10 years  Shingles vaccine: Done 03/19/2017 and 02/02/2019.   Covid-19:Declined.  Advanced directives: Please bring a copy of your health care power of attorney and living will to the office to be added to your chart at your convenience.   Conditions/risks identified: Aim for  6-8 glasses of water per day and eat lots of fruits and vegetables.   Next appointment: Follow up in one year for your annual wellness visit 2024.   Preventive Care 45 Years and Older, Female Preventive care refers to lifestyle choices and visits with your health care provider that can promote health and wellness. What does preventive care include? A yearly physical exam. This is also called an annual well check. Dental exams once or twice a year. Routine eye exams. Ask your health care provider how often you should have your eyes checked. Personal lifestyle choices, including: Daily care of your teeth and gums. Regular physical activity. Eating a healthy diet. Avoiding tobacco and drug use. Limiting alcohol use. Practicing safe sex. Taking low-dose aspirin every day. Taking vitamin and mineral supplements as recommended by your health care provider. What happens during an annual well check? The services and screenings  done by your health care provider during your annual well check will depend on your age, overall health, lifestyle risk factors, and family history of disease. Counseling  Your health care provider may ask you questions about your: Alcohol use. Tobacco use. Drug use. Emotional well-being. Home and relationship well-being. Sexual activity. Eating habits. History of falls. Memory and ability to understand (cognition). Work and work Statistician. Reproductive health. Screening  You may have the following tests or measurements: Height, weight, and BMI. Blood pressure. Lipid and cholesterol levels. These may be checked every 5 years, or more frequently if you are over 63 years old. Skin check. Lung cancer screening. You may have this screening every year starting at age 74 if you have a 30-pack-year history of smoking and currently smoke or have quit within the past 15 years. Fecal occult blood test (FOBT) of the stool. You may have this test every year starting at age 36. Flexible sigmoidoscopy or colonoscopy. You may have a sigmoidoscopy every 5 years or a colonoscopy every 10 years starting at age 25. Hepatitis C blood test. Hepatitis B blood test. Sexually transmitted disease (STD) testing. Diabetes screening. This is done by checking your blood sugar (glucose) after you have not eaten for a while (fasting). You may have this done every 1-3 years. Bone density scan. This is done to screen for osteoporosis. You may have this done starting at age 53. Mammogram. This may be done every 1-2 years. Talk to your health care provider about how often you should have regular mammograms. Talk with your health care provider about your test results, treatment options, and if necessary, the need for more tests. Vaccines  Your health care provider may recommend certain vaccines,  such as: Influenza vaccine. This is recommended every year. Tetanus, diphtheria, and acellular pertussis (Tdap, Td)  vaccine. You may need a Td booster every 10 years. Zoster vaccine. You may need this after age 63. Pneumococcal 13-valent conjugate (PCV13) vaccine. One dose is recommended after age 77. Pneumococcal polysaccharide (PPSV23) vaccine. One dose is recommended after age 67. Talk to your health care provider about which screenings and vaccines you need and how often you need them. This information is not intended to replace advice given to you by your health care provider. Make sure you discuss any questions you have with your health care provider. Document Released: 06/09/2015 Document Revised: 01/31/2016 Document Reviewed: 03/14/2015 Elsevier Interactive Patient Education  2017 [place] Prevention in the Home Falls can cause injuries. They can happen to people of all ages. There are many things you can do to make your home safe and to help prevent falls. What can I do on the outside of my home? Regularly fix the edges of walkways and driveways and fix any cracks. Remove anything that might make you trip as you walk through a door, such as a raised step or threshold. Trim any bushes or trees on the path to your home. Use bright outdoor lighting. Clear any walking paths of anything that might make someone trip, such as rocks or tools. Regularly check to see if handrails are loose or broken. Make sure that both sides of any steps have handrails. Any raised decks and porches should have guardrails on the edges. Have any leaves, snow, or ice cleared regularly. Use sand or salt on walking paths during winter. Clean up any spills in your garage right away. This includes oil or grease spills. What can I do in the bathroom? Use night lights. Install grab bars by the toilet and in the tub and shower. Do not use towel bars as grab bars. Use non-skid mats or decals in the tub or shower. If you need to sit down in the shower, use a plastic, non-slip stool. Keep the floor dry. Clean up any  water that spills on the floor as soon as it happens. Remove soap buildup in the tub or shower regularly. Attach bath mats securely with double-sided non-slip rug tape. Do not have throw rugs and other things on the floor that can make you trip. What can I do in the bedroom? Use night lights. Make sure that you have a light by your bed that is easy to reach. Do not use any sheets or blankets that are too big for your bed. They should not hang down onto the floor. Have a firm chair that has side arms. You can use this for support while you get dressed. Do not have throw rugs and other things on the floor that can make you trip. What can I do in the kitchen? Clean up any spills right away. Avoid walking on wet floors. Keep items that you use a lot in easy-to-reach places. If you need to reach something above you, use a strong step stool that has a grab bar. Keep electrical cords out of the way. Do not use floor polish or wax that makes floors slippery. If you must use wax, use non-skid floor wax. Do not have throw rugs and other things on the floor that can make you trip. What can I do with my stairs? Do not leave any items on the stairs. Make sure that there are handrails on both sides of the stairs and  use them. Fix handrails that are broken or loose. Make sure that handrails are as long as the stairways. Check any carpeting to make sure that it is firmly attached to the stairs. Fix any carpet that is loose or worn. Avoid having throw rugs at the top or bottom of the stairs. If you do have throw rugs, attach them to the floor with carpet tape. Make sure that you have a light switch at the top of the stairs and the bottom of the stairs. If you do not have them, ask someone to add them for you. What else can I do to help prevent falls? Wear shoes that: Do not have high heels. Have rubber bottoms. Are comfortable and fit you well. Are closed at the toe. Do not wear sandals. If you use a  stepladder: Make sure that it is fully opened. Do not climb a closed stepladder. Make sure that both sides of the stepladder are locked into place. Ask someone to hold it for you, if possible. Clearly mark and make sure that you can see: Any grab bars or handrails. First and last steps. Where the edge of each step is. Use tools that help you move around (mobility aids) if they are needed. These include: Canes. Walkers. Scooters. Crutches. Turn on the lights when you go into a dark area. Replace any light bulbs as soon as they burn out. Set up your furniture so you have a clear path. Avoid moving your furniture around. If any of your floors are uneven, fix them. If there are any pets around you, be aware of where they are. Review your medicines with your doctor. Some medicines can make you feel dizzy. This can increase your chance of falling. Ask your doctor what other things that you can do to help prevent falls. This information is not intended to replace advice given to you by your health care provider. Make sure you discuss any questions you have with your health care provider. Document Released: 03/09/2009 Document Revised: 10/19/2015 Document Reviewed: 06/17/2014 Elsevier Interactive Patient Education  2017 [employer].

## 2021-06-12 NOTE — Assessment & Plan Note (Signed)
She is following with Dr Carnella Guadalajara in [place], [place] and scheduled for surgery on 06/27/21.  They believe the providers at [facility] will be managing her antibiotics going forward.  She has discontinued her cefadroxil 1gm BID for her complicated infected left hip PJI as of today at their direction leading up to her surgery. She will follow up with me as needed but I am happy to see her again if she needs ID follow up, but anticipate this will be done in [place].

## 2021-06-12 NOTE — Progress Notes (Signed)
[facility]  CHIEF COMPLAINT:    Follow up for left hip PJI  SUBJECTIVE:    Sharon [name] is a 66 y.o. female with PMHx as below who presents to the clinic for left hip PJI.   She was last seen on 03/28/21 by myself.  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  Postoperatively from that initial surgery she had some soft tissue breakdown of her incision which was done through an anterior approach.  This was successfully able to heal with wound care.  She subsequently did well for a period of several months until August 2022 when she developed new drainage from her left hip incision.  She went to the operating room on 01/02/2021 for this infected left THA and underwent polyethylene liner exchange with hip/ball exchange along with irrigation and debridement.  Her operative cultures yielded a pansensitive Proteus mirabilis.  She had a PICC line placed and completed 6 weeks of IV antibiotics on 02/16/2021.  During the course of her IV antibiotics, inflammatory markers had improved with ESR 48 on 02/12/2021 compared to 85 as a baseline.  She concluded IV antibiotics and was subsequently transitioned to oral cefadroxil 1000 mg twice daily for continued coverage.  The plan was to provide her with at least 12 weeks of antibiotics from date of her initial surgery and then reassess at that point.  Unfortunately, she developed worsening left hip pain and was reevaluated by Dr. Ninfa Linden on 02/28/2021 at which point her inflammatory markers had increased considerably.  MRI was pursued which revealed significant edema in the soft tissue around the hip as well as into the bone of the femur.  There was also a sizable fluid collection measuring 2.4 x 3.5 x 9.3 cm at the incision site with a narrow neck extending into the anterior aspect of the joint.  She saw Dr. Ninfa Linden 11/2 as well who has referred her to  [facility] in [place] with the periprosthetic joint infection center given their expertise in this area.  She reports following up with them and is hopeful for surgery soon in about 2 weeks. She is unfortunately now bed bound.  Her wound opened back up on Christmas Eve but has subsequently closed.  She has been scheduled for her hip surgery on 06/27/21.  She has discontinued her antibiotics at direction of her orthopedist prior to surgery.    Please see A&P for the details of today's visit and status of the patient's medical problems.   Patient's Medications  New Prescriptions   No medications on file  Previous Medications   ALBUTEROL (PROVENTIL) (2.5 MG/3ML) 0.083% NEBULIZER SOLUTION    USE ONE VIAL IN NEBULIZER EVERY 4 HOURS AS NEEDED FOR WHEEZING   ALBUTEROL (VENTOLIN HFA) 108 (90 BASE) MCG/ACT INHALER    Inhale 1-2 puffs into the lungs every 6 (six) hours as needed for wheezing or shortness of breath.   BACLOFEN (LIORESAL) 10 MG TABLET       CALCIUM-VITAMIN D-VITAMIN K (VIACTIV CALCIUM PLUS D) 650-12.5-40 MG-MCG-MCG CHEW    Chew 1 tablet by mouth in the morning, at noon, and at bedtime.   CEFADROXIL (DURICEF) 500 MG CAPSULE    TAKE 2 CAPSULES(1000 MG) BY MOUTH TWICE DAILY   DOCUSATE SODIUM (COLACE) 100 MG CAPSULE    Take 100 mg by mouth 2 (two) times daily as needed for mild constipation.  FEXOFENADINE (ALLEGRA) 180 MG TABLET    Take 180 mg by mouth in the morning.   FLUTICASONE (FLONASE) 50 MCG/ACT NASAL SPRAY    Place 2 sprays into both nostrils at bedtime.   GABAPENTIN (NEURONTIN) 600 MG TABLET       LIDOCAINE HCL-BENZYL ALCOHOL (SALONPAS LIDOCAINE PLUS EX)    Place 1 patch onto the skin every 8 (eight) hours as needed (pain.).   METOPROLOL TARTRATE (LOPRESSOR) 25 MG TABLET    TAKE 1 TABLET(25 MG) BY MOUTH TWICE DAILY   MULTIPLE VITAMINS-MINERALS (BARIATRIC MULTIVITAMINS/IRON PO)    Take 1 tablet by mouth in the morning.   ONDANSETRON (ZOFRAN-ODT) 8 MG DISINTEGRATING TABLET    Take 1  tablet (8 mg total) by mouth every 8 (eight) hours as needed for nausea or vomiting.   OXYCODONE (ROXICODONE) 15 MG IMMEDIATE RELEASE TABLET       OXYCODONE HCL 10 MG TABS    Take 10 mg by mouth 2 (two) times daily as needed.   PANTOPRAZOLE (PROTONIX) 40 MG TABLET    TAKE 1 TABLET BY MOUTH EVERY DAY FOR REFLUX   PANTOPRAZOLE (PROTONIX) 40 MG TABLET    Take 1 tablet (40 mg total) by mouth 2 (two) times daily.  Modified Medications   No medications on file  Discontinued Medications   No medications on file      Past Medical History:  Diagnosis Date   Anginal pain ([place])    in February 8242   Biliary colic    Cancer ([place])    melanoma: lt. inner thight   Carpal tunnel syndrome    bilateral   Chronic allergic rhinitis    Chronic urticaria    Complication of anesthesia    confusion for a couple of days after waking after having multiple knee surgery's, does well with spinal   Degenerative disk disease    Diverticulosis of colon    Dyspnea    Dysrhythmia    February 2022   Fatty liver    Fibrocystic breast disease 1996   GERD (gastroesophageal reflux disease)    History of colon polyps    HYPERPLASTIC 09-14-2015   History of kidney stones    passed on own   History of melanoma excision    left thigh ,inside 2009--- per pt also has had severeal excision of office for almost melanoma   Hypertension    Migraine    since age 27 not as frequent now   Moderate persistent asthma, uncomplicated    followed by dr gallagher (cone allergy and asthma)   OA (osteoarthritis)    all joints   SUI (stress urinary incontinence, female)     Social History   Tobacco Use   Smoking status: Former    Packs/day: 1.00    Years: 25.00    Pack years: 25.00    Types: Cigarettes    Start date: 08/02/1972    Quit date: 05/27/2006    Years since quitting: 15.0   Smokeless tobacco: Never  Vaping Use   Vaping Use: Never used  Substance Use Topics   Alcohol use: Yes    Alcohol/week: 0.0 standard  drinks    Comment: occas.   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    Augmentin [Amoxicillin-Pot Clavulanate] Diarrhea and Nausea Only    Tolerated cefazolin and ceftriaxone 12/2020   Neomycin-Bacitracin Zn-Polymyx Dermatitis    NEOSPORIN    Review of Systems  Constitutional: Negative.   Gastrointestinal: Negative.   Musculoskeletal:  Positive for joint pain.    OBJECTIVE:      Physical Exam Appears well, in NAD on video.   Labs and Microbiology: CBC Latest Ref Rng & Units 04/27/2021 03/28/2021 02/28/2021  WBC 3.4 - 10.8 x10E3/uL 9.4 9.0 8.7  Hemoglobin 11.1 - 15.9 g/dL 11.0(L) 11.3(L) 12.2  Hematocrit 34.0 - 46.6 % 34.1 34.1(L) 37.2  Platelets 150 - 450 x10E3/uL 422 386 438(H)   CMP Latest Ref Rng & Units 03/28/2021 01/07/2021 01/06/2021  Glucose 65 - 99 mg/dL 88 115(H) 107(H)  BUN 7 - 25 mg/dL 15 9 7(L)  Creatinine 0.50 - 1.05 mg/dL 0.57 0.65 0.59  Sodium 135 - 146 mmol/L 139 134(L) 133(L)  Potassium 3.5 - 5.3 mmol/L 4.2 3.4(L) 3.5  Chloride 98 - 110 mmol/L 101 98 102  CO2 20 - 32 mmol/L $RemoveB'29 27 25  'oMdeZcFp$ Calcium 8.6 - 10.4 mg/dL 9.2 8.0(L) 7.8(L)  Total Protein 6.1 - 8.1 g/dL 7.2 5.3(L) -  Total Bilirubin 0.2 - 1.2 mg/dL 0.2 0.2(L) -  Alkaline  Phos 38 - 126 U/L - 82 -  AST 10 - 35 U/L 12 19 -  ALT 6 - 29 U/L 8 14 -      ASSESSMENT & PLAN:    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  They believe the providers at [facility] will be managing her antibiotics going forward.  She has discontinued her cefadroxil 1gm BID for her complicated infected left hip PJI as of today at their direction leading up to her surgery. She will follow up with me as needed but I am happy to see her again if she needs ID follow up, but anticipate this will be done in [place].        [facility] 06/12/2021, 4:01 PM   Virtual Visit via Video Note   I connected with Sharon [name] on 06/12/21 at 4:01 PM by video and verified that I am speaking with the correct person using two identifiers.   I discussed the limitations, risks, security and privacy concerns of performing an evaluation and management service by telephone and the availability of in person appointments. I also discussed with the patient that there may be a patient responsible charge related to this service. The patient expressed understanding and agreed to proceed.  Patient location: home My location: [email or web address] Duration of call:  18 ° ° °

## 2021-06-12 NOTE — Progress Notes (Signed)
Subjective:   Sharon [name] is a 66 y.o. female who presents for Medicare Annual (Subsequent) preventive examination. Virtual Visit via Telephone Note  I connected with  [name] on 06/12/21 at  2:20 PM EST by telephone and verified that I am speaking with the correct person using two identifiers.  Location: Patient: Home Provider: [facility] Persons participating in the virtual visit: patient/Nurse Health Advisor   I discussed the limitations, risks, security and privacy concerns of performing an evaluation and management service by telephone and the availability of in person appointments. The patient expressed understanding and agreed to proceed.  Interactive audio and video telecommunications were attempted between this nurse and patient, however failed, due to patient having technical difficulties OR patient did not have access to video capability.  We continued and completed visit with audio only.  Some vital signs may be absent or patient reported.   [name], LPN  Review of Systems     Cardiac Risk Factors include: advanced age (>70men, >19 women);Other (see comment);obesity (BMI >30kg/m2);sedentary lifestyle, Risk factor comments: Hip pain/difficulty wiht movement. Pt is currently bed bound. Has hip surgery scheduled for 06/27/2021.     Objective:    Today's Vitals   06/12/21 1422 06/12/21 1424  Weight: 209 lb (94.8 kg)   Height: 5' (1.524 m)   PainSc:  7    Body mass index is 40.82 kg/m.  Advanced Directives 06/12/2021 05/01/2021 01/02/2021 04/14/2020 04/14/2020 04/11/2020 08/16/2019  Does Patient Have a Medical Advance Directive? Yes Yes Yes Yes Yes Yes Yes  Type of Paramedic of [place];Living will [place];Living will [place];Living will [place];Living will [place];Living will [place];Living will [place];Living will  Does patient want to make changes to medical advance directive? - No - Patient declined No - Patient declined No - Patient declined - - No - Patient declined  Copy of [place] in Chart? No - copy requested No - copy requested No - copy requested No - copy requested - - -  Pre-existing out of facility DNR order (yellow form or pink MOST form) - - - - - - -  Some encounter information is confidential and restricted. Go to Review Flowsheets activity to see all data.    Current Medications (verified) Outpatient Encounter Medications as of 06/12/2021  Medication Sig   albuterol (PROVENTIL) (2.5 MG/3ML) 0.083% nebulizer solution USE ONE VIAL IN NEBULIZER EVERY 4 HOURS AS NEEDED FOR WHEEZING   albuterol (VENTOLIN HFA) 108 (90 Base) MCG/ACT inhaler Inhale 1-2 puffs into the lungs every 6 (six) hours as needed for wheezing or shortness of breath.   baclofen (LIORESAL) 10 MG tablet    Calcium-Vitamin D-Vitamin K (VIACTIV CALCIUM PLUS D) 650-12.5-40 MG-MCG-MCG CHEW Chew 1 tablet by mouth in the morning, at noon, and at bedtime.   cefadroxil (DURICEF) 500 MG capsule TAKE 2 CAPSULES(1000 MG) BY MOUTH TWICE DAILY (Patient not taking: Reported on 06/12/2021)   docusate sodium (COLACE) 100 MG capsule Take 100 mg by mouth 2 (two) times daily as needed for mild constipation.   fexofenadine (ALLEGRA) 180 MG tablet Take 180 mg by mouth in the morning.   fluticasone (FLONASE) 50 MCG/ACT nasal spray Place 2 sprays into both nostrils at bedtime.   gabapentin (NEURONTIN) 600 MG tablet    Lidocaine HCl-Benzyl Alcohol (SALONPAS LIDOCAINE PLUS EX) Place 1 patch onto the skin every 8 (  eight) hours as needed (pain.).   metoprolol tartrate (LOPRESSOR) 25 MG tablet TAKE 1 TABLET(25 MG) BY MOUTH TWICE DAILY   Multiple Vitamins-Minerals (BARIATRIC MULTIVITAMINS/IRON PO) Take 1 tablet by mouth in the morning.   ondansetron (ZOFRAN-ODT) 8 MG disintegrating tablet Take 1 tablet (8 mg total)  by mouth every 8 (eight) hours as needed for nausea or vomiting.   oxyCODONE (ROXICODONE) 15 MG immediate release tablet    pantoprazole (PROTONIX) 40 MG tablet Take 1 tablet (40 mg total) by mouth 2 (two) times daily.   Oxycodone HCl 10 MG TABS Take 10 mg by mouth 2 (two) times daily as needed.   pantoprazole (PROTONIX) 40 MG tablet TAKE 1 TABLET BY MOUTH EVERY DAY FOR REFLUX (Patient not taking: Reported on 06/12/2021)   [DISCONTINUED] baclofen (LIORESAL) 10 MG tablet TAKE 1 TABLET(10 MG) BY MOUTH THREE TIMES DAILY   [DISCONTINUED] gabapentin (NEURONTIN) 300 MG capsule Take 1 capsule (300 mg total) by mouth 2 (two) times daily.   [DISCONTINUED] methocarbamol (ROBAXIN) 500 MG tablet TAKE 1 TABLET(500 MG) BY MOUTH EVERY 8 HOURS AS NEEDED FOR MUSCLE SPASMS   [DISCONTINUED] oxyCODONE (ROXICODONE) 15 MG immediate release tablet Take 15 mg by mouth 3 (three) times daily as needed.   No facility-administered encounter medications on file as of 06/12/2021.    Allergies (verified) Other, Adhesive [tape], Latex, Nickel, Vancomycin, Augmentin [amoxicillin-pot clavulanate], and Neomycin-bacitracin zn-polymyx   History: Past Medical History:  Diagnosis Date   Anginal pain ([place])    in February 9147   Biliary colic    Cancer ([place])    melanoma: lt. inner thight   Carpal tunnel syndrome    bilateral   Chronic allergic rhinitis    Chronic urticaria    Complication of anesthesia    confusion for a couple of days after waking after having multiple knee surgery's, does well with spinal   Degenerative disk disease    Diverticulosis of colon    Dyspnea    Dysrhythmia    February 2022   Fatty liver    Fibrocystic breast disease 1996   GERD (gastroesophageal reflux disease)    History of colon polyps    HYPERPLASTIC 09-14-2015   History of kidney stones    passed on own   History of melanoma excision    left thigh ,inside 2009--- per pt also has had severeal excision of office for almost melanoma    Hypertension    Migraine    since age 22 not as frequent now   Moderate persistent asthma, uncomplicated    followed by dr gallagher ([facility] allergy and asthma)   OA (osteoarthritis)    all joints   SUI (stress urinary incontinence, female)    Past Surgical History:  Procedure Laterality Date   ANTERIOR HIP REVISION Left 01/02/2021   Procedure: IRRIGATION AND DEBRIDEMENT LEFT HIP WITH HIP BALL AND POLY ETHYLENE LINER EXCHANGE.;  Surgeon: Mcarthur Rossetti, MD;  Location: [facility];  Service: Orthopedics;  Laterality: Left;  needs RNFA   APPENDECTOMY  8295   APPLICATION OF WOUND VAC Left 01/02/2021   Procedure: APPLICATION OF WOUND VAC;  Surgeon: Mcarthur Rossetti, MD;  Location: [facility];  Service: Orthopedics;  Laterality: Left;   BREAST EXCISIONAL BIOPSY Right 1994;  10-01-2009   2011 per path mild ductal hyperplasia/ fibrocystic breast   BREAST SURGERY     CHOLECYSTECTOMY N/A 01/30/2017   Procedure: LAPAROSCOPIC CHOLECYSTECTOMY;  Surgeon: Kieth Brightly Arta Bruce, MD;  Location: [facility];  Service: General;  Laterality: N/A;   COLONOSCOPY N/A 09/14/2015   Procedure: COLONOSCOPY;  Surgeon: Daneil Dolin, MD;  [facility];  Service: Endoscopy;  Laterality: N/A;  930 - moved to 9:15 - office to notify   ESOPHAGOGASTRODUODENOSCOPY (EGD) WITH PROPOFOL N/A 11/29/2016   Procedure: ESOPHAGOGASTRODUODENOSCOPY (EGD) WITH PROPOFOL;  Surgeon: Doran Stabler, MD;  [facility];  Service: Gastroenterology;  Laterality: N/A;   JOINT REPLACEMENT     knee replacement and left hip replacement   KNEE ARTHROSCOPY W/ MENISCECTOMY Bilateral left 12-19-2006;  right 01-26-2010   LAPAROSCOPIC GASTRIC SLEEVE RESECTION N/A 08/16/2019   Procedure: LAPAROSCOPIC GASTRIC SLEEVE RESECTION, Upper Endo, ERAS Pathway;  Surgeon: Kinsinger, Arta Bruce, MD;  [facility];  Service: General;  Laterality: N/A;   [place] Left 04/14/2020    Procedure: LEFT TOTAL HIP ARTHROPLASTY ANTERIOR APPROACH;  Surgeon: Mcarthur Rossetti, MD;  [facility];  Service: Orthopedics;  Laterality: Left;   TOTAL KNEE ARTHROPLASTY Left 03/10/2007   TOTAL KNEE ARTHROPLASTY  06/17/2011   Procedure: TOTAL KNEE ARTHROPLASTY;  Surgeon: Arther Abbott, MD;  [facility];  Service: Orthopedics;  Laterality: Right;  [place]   TOTAL KNEE REVISION Left 07/08/2010  (spinal anesthesia)   TUBAL LIGATION Bilateral YRS AGO   VAGINAL HYSTERECTOMY  1988   Family History  Problem Relation Age of Onset   Hypertension Father    Lung cancer Father 20       lung cancer   Lung disease Other        family history    Allergic rhinitis Daughter    Food Allergy Grandchild    Anesthesia problems Neg Hx    Hypotension Neg Hx    Malignant hyperthermia Neg Hx    Pseudochol deficiency Neg Hx    Colon cancer Neg Hx    Angioedema Neg Hx    Asthma Neg Hx    Eczema Neg Hx    Immunodeficiency Neg Hx    Urticaria Neg Hx    Social History   Socioeconomic History   Marital status: Divorced    Spouse name: Not on file   Number of children: Not on file   Years of education: Not on file   Highest education level: Associate degree: occupational, Hotel manager, or vocational program  Occupational History   Occupation: Therapist, sports at [employer]   Tobacco Use   Smoking status: Former    Packs/day: 1.00    Years: 25.00    Pack years: 25.00    Types: Cigarettes    Start date: 08/02/1972    Quit date: 05/27/2006    Years since quitting: 15.0   Smokeless tobacco: Never  Vaping Use   Vaping Use: Never used  Substance and Sexual Activity   Alcohol use: Yes    Alcohol/week: 0.0 standard drinks    Comment: occas.   Drug use: No   Sexual activity: Yes    Birth control/protection: Surgical  Other Topics Concern   Not on file  Social History Narrative   Not on file   Social Determinants of Health   Financial Resource Strain: High Risk   Difficulty of Paying Living Expenses:  Very hard  Food Insecurity: Food Insecurity Present   Worried About Charity fundraiser in the Last Year: Often true   Arboriculturist in the Last Year: Often true  Transportation Needs: Unmet Transportation Needs   Lack of Transportation (Medical): Yes   Lack of Transportation (Non-Medical):  No  Physical Activity: Inactive   Days of Exercise per Week: 0 days   Minutes of Exercise per Session: 0 min  Stress: Stress Concern Present   Feeling of Stress : Very much  Social Connections: Socially Isolated   Frequency of Communication with Friends and Family: More than three times a week   Frequency of Social Gatherings with Friends and Family: Never   Attends Religious Services: Never   Marine scientist or Organizations: No   Attends Music therapist: Never   Marital Status: Divorced    Tobacco Counseling Counseling given: Not Answered   Clinical Intake:  Pre-visit preparation completed: Yes  Pain : 0-10 Pain Score: 7  Pain Type: Chronic pain Pain Location: Hip Pain Descriptors / Indicators: Aching Pain Onset: More than a month ago Pain Frequency: Intermittent     BMI - recorded: 40.82 Nutritional Status: BMI > 30  Obese Nutritional Risks: None Diabetes: No  How often do you need to have someone help you when you read instructions, pamphlets, or other written materials from your doctor or pharmacy?: 1 - Never  Diabetic?No  Interpreter Needed?: No      Activities of Daily Living In your present state of health, do you have any difficulty performing the following activities: 06/12/2021 06/10/2021  Hearing? N N  Vision? N N  Difficulty concentrating or making decisions? - Y  Walking or climbing stairs? Y Y  Dressing or bathing? Y Y  Doing errands, shopping? Tempie Donning  Preparing Food and eating ? Y Y  Using the Toilet? Y Y  In the past six months, have you accidently leaked urine? Y Y  Do you have problems with loss of bowel control? N Y  Managing  your Medications? Y Y  Managing your Finances? Tempie Donning  Housekeeping or managing your Housekeeping? Tempie Donning  Some recent data might be hidden    Patient Care Team: Coral Spikes, DO as PCP - General (Family Medicine) Minus Breeding, MD as PCP - Cardiology (Cardiology) Derek Jack, MD as Medical Oncologist (Hematology)  Indicate any recent Medical Services you may have received from other than [facility] providers in the past year (date may be approximate).     Assessment:   This is a routine wellness examination for [place].  Hearing/Vision screen Hearing Screening - Comments:: Some hearing issues. Vision Screening - Comments:: Glasses. [facility].  Dietary issues and exercise activities discussed: Current Exercise Habits: The patient does not participate in regular exercise at present, Exercise limited by: orthopedic condition(s)   Goals Addressed   None    Depression Screen PHQ 2/9 Scores 06/12/2021 04/27/2021 09/01/2020 08/18/2020 03/24/2019 06/04/2017 12/02/2016  PHQ - 2 Score 2 0 0 0 0 0 0  PHQ- 9 Score 4 - - - - - -    Fall Risk Fall Risk  06/12/2021 06/10/2021 04/27/2021 09/01/2020 08/18/2020  Falls in the past year? 1 1 0 0 0  Number falls in past yr: 1 0 0 - -  Injury with Fall? 1 1 0 - -  Risk for fall due to : Impaired balance/gait;Impaired mobility;History of fall(s) - No Fall Risks - -  Risk for fall due to: Comment - - - - -  Follow up - - Falls evaluation completed Falls evaluation completed Falls evaluation completed    [place]:  Any stairs in or around the home? Yes  If so, are there any without handrails? No  Home  free of loose throw rugs in walkways, pet beds, electrical cords, etc? Yes  Adequate lighting in your home to reduce risk of falls? Yes   ASSISTIVE DEVICES UTILIZED TO PREVENT FALLS:  Life alert? No  Use of a cane, walker or w/c? Yes  Grab bars in the bathroom? Yes  Shower chair or bench in shower? Yes   Elevated toilet seat or a handicapped toilet? Yes   TIMED UP AND GO:  Was the test performed? No .  Phone visit.  Cognitive Function: Normal cognitive status assessed by direct observation by this Nurse Health Advisor. No abnormalities found.  Pt declined 6CIT due to being on several strong pain medications and feels her answers would be skewed. Pt was able to answer questions regarding health appropriately.         Immunizations Immunization History  Administered Date(s) Administered   Influenza,inj,Quad PF,6+ Mos 03/04/2016, 03/19/2017, 02/04/2018   Influenza-Unspecified 04/27/2011, 03/08/2014, 03/27/2015, 03/27/2015, 03/19/2017, 03/19/2017, 02/12/2019   Pneumococcal Polysaccharide-23 02/24/2010   Td 09/17/2013   Zoster Recombinat (Shingrix) 03/19/2017, 02/12/2019    TDAP status: Up to date  Flu Vaccine status: Due, Education has been provided regarding the importance of this vaccine. Advised may receive this vaccine at local pharmacy or Health Dept. Aware to provide a copy of the vaccination record if obtained from local pharmacy or Health Dept. Verbalized acceptance and understanding.  Pneumococcal vaccine status: Due, Education has been provided regarding the importance of this vaccine. Advised may receive this vaccine at local pharmacy or Health Dept. Aware to provide a copy of the vaccination record if obtained from local pharmacy or Health Dept. Verbalized acceptance and understanding.  Covid-19 vaccine status: Declined, Education has been provided regarding the importance of this vaccine but patient still declined. Advised may receive this vaccine at local pharmacy or Health Dept.or vaccine clinic. Aware to provide a copy of the vaccination record if obtained from local pharmacy or Health Dept. Verbalized acceptance and understanding.  Qualifies for Shingles Vaccine? Yes   Zostavax completed Yes   Shingrix Completed?: Yes  Screening Tests Health Maintenance  Topic Date  Due   Hepatitis C Screening  Never done   Pneumonia Vaccine 61+ Years old (2 - PCV) 02/25/2011   INFLUENZA VACCINE  06/26/2021 (Originally 12/25/2020)   MAMMOGRAM  05/22/2022   TETANUS/TDAP  09/18/2023   COLONOSCOPY (Pts 45-40yrs Insurance coverage will need to be confirmed)  09/13/2025   DEXA SCAN  Completed   Zoster Vaccines- Shingrix  Completed   HPV VACCINES  Aged Out   COVID-19 Vaccine  Discontinued    Health Maintenance  Health Maintenance Due  Topic Date Due   Hepatitis C Screening  Never done   Pneumonia Vaccine 32+ Years old (2 - PCV) 02/25/2011    Colorectal cancer screening: Type of screening: Colonoscopy. Completed 09/14/2015. Repeat every 10 years  Mammogram status: Completed 05/22/2020. Repeat every year  Bone Density status: Completed 10/28/2014. Results reflect: Bone density results: NORMAL. Repeat every 2 years.  Lung Cancer Screening: (Low Dose CT Chest recommended if Age 77-80 years, 30 pack-year currently smoking OR have quit w/in 15years.) does not qualify.    Additional Screening:  Hepatitis C Screening: does qualify; Completed Due  Vision Screening: Recommended annual ophthalmology exams for early detection of glaucoma and other disorders of the eye. Is the patient up to date with their annual eye exam?  Yes  Who is the provider or what is the name of the office in which the patient attends annual  eye exams? [facility]. If pt is not established with a provider, would they like to be referred to a provider to establish care? No .   Dental Screening: Recommended annual dental exams for proper oral hygiene  Community Resource Referral / Chronic Care Management: CRR required this visit?  Yes   CCM required this visit?  No      Plan:     I have personally reviewed and noted the following in the patients chart:   Medical and social history Use of alcohol, tobacco or illicit drugs  Current medications and supplements including opioid  prescriptions.  Functional ability and status Nutritional status Physical activity Advanced directives List of other physicians Hospitalizations, surgeries, and ER visits in previous 12 months Vitals Screenings to include cognitive, depression, and falls Referrals and appointments  In addition, I have reviewed and discussed with patient certain preventive protocols, quality metrics, and best practice recommendations. A written personalized care plan for preventive services as well as general preventive health recommendations were provided to patient.     [name], LPN   3/61/2244   Nurse Notes: Pt c/o issues with being able to afford housing, food and transportation due to medical bills. Offered CRR referral and pt is agreeable. Referral placed. Pt is scheduled for hip surgery 06/27/2021 for possible hip replacement. Pt would like to wait and schedule mammogram and bone density after she has recuperated from her surgery. Pt declined 6CIT due to being on several strong pain medications.

## 2021-06-20 ENCOUNTER — Telehealth: Payer: Self-pay

## 2021-06-20 NOTE — Telephone Encounter (Signed)
° °  Telephone encounter was:  Successful.  06/20/2021 Name: Sharon [name] MRN: [id number] DOB: 12/18/1955  Sharon [name] is a 66 y.o. year old female who is a primary care patient of Coral Spikes, DO . The community resource team was consulted for assistance with   Care guide performed the following interventions:  Daughter answered moms phone and I advised reason of call. Daughter advised at this time, "everything is up in the air" as mom has surgery on friday so things may change as to what she really needs.   I did inquire about what mom/family could use right now and daughter advised assistance with medical bill, property tax information, and food resources. I have sent information in e-mail. (Enclosed: Transportation Aging, Disability & Transit Services of [place], [employer] pantries, Meals on [employer], Painted plate cares, Property Tax Biomedical scientist, [facility] and Midwife for assistance with Medical bills as well as [employer] and in-home care/private duty care/sitters information).  Follow Up Plan:  No further follow up planned at this time. The patient has been provided with needed resources. Daughter will out reach if further assistance is needed.  [place] management  [place], [place]  Main Phone: [phone]   E-mail: [email or web address]  Website: [email or web address]

## 2021-06-23 ENCOUNTER — Encounter: Payer: Self-pay | Admitting: Orthopaedic Surgery

## 2021-07-09 ENCOUNTER — Other Ambulatory Visit (HOSPITAL_COMMUNITY): Payer: Self-pay | Admitting: *Deleted

## 2021-07-09 DIAGNOSIS — D509 Iron deficiency anemia, unspecified: Secondary | ICD-10-CM

## 2021-07-09 DIAGNOSIS — D696 Thrombocytopenia, unspecified: Secondary | ICD-10-CM

## 2021-07-10 ENCOUNTER — Inpatient Hospital Stay (HOSPITAL_COMMUNITY): Payer: PPO

## 2021-07-10 ENCOUNTER — Ambulatory Visit (HOSPITAL_COMMUNITY): Payer: PPO | Admitting: Hematology

## 2021-08-17 ENCOUNTER — Telehealth: Payer: Self-pay

## 2021-08-17 NOTE — Telephone Encounter (Signed)
Transition Care Management Follow-up Telephone Call ?Date of discharge and from where: [facility] 08/15/21. ?How have you been since you were released from the hospital? Spoke with pt's daughter and she states overall pt is doing well. Pt does have PICC line and wound vac. ?Any questions or concerns? No ? ?Items Reviewed: ?Did the pt receive and understand the discharge instructions provided? Yes  ?Medications obtained and verified? Yes  ?Other? No  ?Any new allergies since your discharge? No  ?Dietary orders reviewed? Yes ?Do you have support at home? Yes  ? ?Home Care and Equipment/Supplies: ?Were home health services ordered? yes ?If so, what is the name of the agency? [facility]  ?Has the agency set up a time to come to the patient's home? yes ?Were any new equipment or medical supplies ordered?  No ?What is the name of the medical supply agency? N/A ?Were you able to get the supplies/equipment? not applicable ?Do you have any questions related to the use of the equipment or supplies? No ? ?Functional Questionnaire: (I = Independent and D = Dependent) ?ADLs: D-daughter assists ? ?Bathing/Dressing- D-daughter assists ? ?Meal Prep- D ? ?Eating- I ? ?Maintaining continence- I with walker ? ?Transferring/Ambulation- I with walker ? ?Managing Meds- D ? ?Follow up appointments reviewed: ? ?PCP Hospital f/u appt confirmed? Yes  Scheduled to see Dr. Lacinda Axon on 08/22/21 @ 2:10 pm. ?[facility] f/u appt confirmed? Yes  Scheduled to see Dr. Carnella Guadalajara on 09/20/21. ?Are transportation arrangements needed? No  ?If their condition worsens, is the pt aware to call PCP or go to the Emergency Dept.? Yes ?Was the patient provided with contact information for the PCP's office or ED? Yes ?Was to pt encouraged to call back with questions or concerns? Yes ? ?

## 2021-08-17 NOTE — Telephone Encounter (Signed)
[name], LPN   ? ?Pt and daughter requested a telephone appointment for patient due to patient still having a PICC line and Wound Vac. Thank you.   ? ?

## 2021-08-22 ENCOUNTER — Ambulatory Visit (INDEPENDENT_AMBULATORY_CARE_PROVIDER_SITE_OTHER): Payer: PPO | Admitting: Family Medicine

## 2021-08-22 ENCOUNTER — Inpatient Hospital Stay: Payer: PPO | Admitting: Family Medicine

## 2021-08-22 ENCOUNTER — Telehealth: Payer: Self-pay | Admitting: Family Medicine

## 2021-08-22 ENCOUNTER — Encounter: Payer: Self-pay | Admitting: Family Medicine

## 2021-08-22 DIAGNOSIS — G894 Chronic pain syndrome: Secondary | ICD-10-CM | POA: Diagnosis not present

## 2021-08-22 NOTE — Progress Notes (Signed)
Virtual Visit via Telephone Note ? ?I connected with [name] on 08/22/21 at  2:10 PM EDT by telephone and verified that I am speaking with the correct person using two identifiers. ? ?Location: ?Patient: Home. ?Provider: Office. ?  ?I discussed the limitations, risks, security and privacy concerns of performing an evaluation and management service by telephone and the availability of in person appointments. I also discussed with the patient that there may be a patient responsible charge related to this service. The patient expressed understanding and agreed to proceed. ? ? ?History of Present Illness: ?66 year old female with multiple medical problems with ongoing issues following hip replacement.  She has had issues with infection after initial hip replacement in 2021.  She has recently underwent 3 different procedures regarding this since January.  Patient states that she is now on oral antibiotics and seems to be doing okay.  Her mobility has improved but is still not great. She is in need of a form filled out regarding life insurance/disability. Visit today is to help me fill out this form regarding dates/time/etc. ?  ?Observations/Objective: ?Speaking in full sentences.  ?Seems to be in good spirits. ? 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 ?Form was filled out as best as possible regarding dates and medical history. ?Patient states that she is recovering as best that she can at this moment time. ? ?  ?I discussed the assessment and treatment plan with the patient. The patient was provided an opportunity to ask questions and all were answered. The patient agreed with the plan and demonstrated an understanding of the instructions. ?  ?The patient was advised to call back or seek an in-person evaluation if the symptoms worsen or if the condition fails to improve as anticipated. ? ?I provided 10 minutes of non-face-to-face time during  this encounter. ? ?Coral Spikes, DO ? ?

## 2021-08-22 NOTE — Telephone Encounter (Signed)
Ms. fumiye, lubben are scheduled for a virtual visit with your provider today.   ? ?Just as we do with appointments in the office, we must obtain your consent to participate.  Your consent will be active for this visit and any virtual visit you may have with one of our providers in the next 365 days.   ? ?If you have a MyChart account, I can also send a copy of this consent to you electronically.  All virtual visits are billed to your insurance company just like a traditional visit in the office.  As this is a virtual visit, video technology does not allow for your provider to perform a traditional examination.  This may limit your provider's ability to fully assess your condition.  If your provider identifies any concerns that need to be evaluated in person or the need to arrange testing such as labs, EKG, etc, we will make arrangements to do so.   ? ?Although advances in technology are sophisticated, we cannot ensure that it will always work on either your end or our end.  If the connection with a video visit is poor, we may have to switch to a telephone visit.  With either a video or telephone visit, we are not always able to ensure that we have a secure connection.   I need to obtain your verbal consent now.   Are you willing to proceed with your visit today?  ? ?HARVEST DEIST has provided verbal consent on 08/22/2021 for a virtual visit (video or telephone). ? ? ?Vicente Males, LPN ?2/80/0349  1:79 PM ?  ?

## 2021-09-17 DIAGNOSIS — I10 Essential (primary) hypertension: Secondary | ICD-10-CM | POA: Insufficient documentation

## 2021-09-17 DIAGNOSIS — R072 Precordial pain: Secondary | ICD-10-CM | POA: Insufficient documentation

## 2021-09-17 NOTE — Progress Notes (Signed)
?  ?Cardiology Office Note ? ? ?Date:  09/19/2021  ? ?ID:  Sharon [name], DOB 12-19-1955, MRN [id number] ? ?PCP:  Coral Spikes, DO  ?Cardiologist:   Minus Breeding, MD ?Referring:  Coral Spikes, DO ? ?Chief Complaint  ?Patient presents with  ? Palpitations  ? ? ? ?  ?History of Present Illness: ?Sharon [name] is a 66 y.o. female who was referred by Coral Spikes, DO for evaluation of palpitations.  The patient has had a revision hip surgery in [place] since I saw her.  She had infection and had to have her hip replacement removed.  She had an anterior spacer and in hospital antibiotics for a week and then she had a new hip placed.  She subsequently had a wound dehiscence.  She is finally recovering from all of this.  She is getting PT at home.  She did not have any cardiac issues although she did have a low diastolic blood pressure in the hospital and reduce her metoprolol to 12 and half milligrams twice daily.  Since that time she has not had lower blood pressures.  Come back up.  She has had some increased palpitations at night.  She has a little bit of chest discomfort with those increased palpitations at night.  She is not having any new shortness of breath because she is not really doing very much. ? ? ?Past Medical History:  ?Diagnosis Date  ? Anginal pain ([place])   ? in February 2022  ? Biliary colic   ? Cancer [facility])   ? melanoma: lt. inner thight  ? Carpal tunnel syndrome   ? bilateral  ? Chronic allergic rhinitis   ? Chronic urticaria   ? Complication of anesthesia   ? confusion for a couple of days after waking after having multiple knee surgery's, does well with spinal  ? Degenerative disk disease   ? Diverticulosis of colon   ? Dyspnea   ? Dysrhythmia   ? February 2022  ? Fatty liver   ? Fibrocystic breast disease 1996  ? GERD (gastroesophageal reflux disease)   ? History of colon polyps   ? HYPERPLASTIC 09-14-2015  ? History of kidney stones   ? passed on own  ? History of melanoma excision   ?  left thigh ,inside 2009--- per pt also has had severeal excision of office for almost melanoma  ? Hypertension   ? Migraine   ? since age 22 not as frequent now  ? Moderate persistent asthma, uncomplicated   ? followed by dr gallagher (cone allergy and asthma)  ? OA (osteoarthritis)   ? all joints  ? SUI (stress urinary incontinence, female)   ? ? ?Past Surgical History:  ?Procedure Laterality Date  ? ANTERIOR HIP REVISION Left 01/02/2021  ? Procedure: IRRIGATION AND DEBRIDEMENT LEFT HIP WITH HIP BALL AND POLY ETHYLENE LINER EXCHANGE.;  Surgeon: Mcarthur Rossetti, MD;  Location: [place];  Service: Orthopedics;  Laterality: Left;  needs RNFA  ? APPENDECTOMY  1973  ? APPLICATION OF WOUND VAC Left 01/02/2021  ? Procedure: APPLICATION OF WOUND VAC;  Surgeon: Mcarthur Rossetti, MD;  Location: [place];  Service: Orthopedics;  Laterality: Left;  ? BREAST EXCISIONAL BIOPSY Right 1994;  10-01-2009  ? 2011 per path mild ductal hyperplasia/ fibrocystic breast  ? BREAST SURGERY    ? CHOLECYSTECTOMY N/A 01/30/2017  ? Procedure: LAPAROSCOPIC CHOLECYSTECTOMY;  Surgeon: Kinsinger, Arta Bruce, MD;  Location: [facility];  Service: General;  Laterality: N/A;  ? COLONOSCOPY N/A 09/14/2015  ? Procedure: COLONOSCOPY;  Surgeon: Daneil Dolin, MD;  [facility];  Service: Endoscopy;  Laterality: N/A;  930 - moved to 9:15 - office to notify  ? ESOPHAGOGASTRODUODENOSCOPY (EGD) WITH PROPOFOL N/A 11/29/2016  ? Procedure: ESOPHAGOGASTRODUODENOSCOPY (EGD) WITH PROPOFOL;  Surgeon: Doran Stabler, MD;  Location: [facility];  Service: Gastroenterology;  Laterality: N/A;  ? JOINT REPLACEMENT    ? knee replacement and left hip replacement  ? KNEE ARTHROSCOPY W/ MENISCECTOMY Bilateral left 12-19-2006;  right 01-26-2010  ? LAPAROSCOPIC GASTRIC SLEEVE RESECTION N/A 08/16/2019  ? Procedure: LAPAROSCOPIC GASTRIC SLEEVE RESECTION, Upper Endo, ERAS Pathway;  Surgeon: Kinsinger, Arta Bruce, MD;  Location: [facility];   Service: General;  Laterality: N/A;  ? TONSILLECTOMY  1959  ? TOTAL HIP ARTHROPLASTY Left 04/14/2020  ? Procedure: LEFT TOTAL HIP ARTHROPLASTY ANTERIOR APPROACH;  Surgeon: Mcarthur Rossetti, MD;  Location: [facility];  Service: Orthopedics;  Laterality: Left;  ? TOTAL KNEE ARTHROPLASTY Left 03/10/2007  ? TOTAL KNEE ARTHROPLASTY  06/17/2011  ? Procedure: TOTAL KNEE ARTHROPLASTY;  Surgeon: Arther Abbott, MD;  Location: [facility];  Service: Orthopedics;  Laterality: Right;  [place]  ? TOTAL KNEE REVISION Left 07/08/2010  (spinal anesthesia)  ? TUBAL LIGATION Bilateral YRS AGO  ? VAGINAL HYSTERECTOMY  1988  ? ? ? ?Current Outpatient Medications  ?Medication Sig Dispense Refill  ? albuterol (PROVENTIL) (2.5 MG/3ML) 0.083% nebulizer solution USE ONE VIAL IN NEBULIZER EVERY 4 HOURS AS NEEDED FOR WHEEZING 50 vial 2  ? albuterol (VENTOLIN HFA) 108 (90 Base) MCG/ACT inhaler Inhale 1-2 puffs into the lungs every 6 (six) hours as needed for wheezing or shortness of breath. 18 g 2  ? aspirin 81 MG chewable tablet 2 (two) times daily.    ? baclofen (LIORESAL) 10 MG tablet     ? Calcium-Vitamin D-Vitamin K (VIACTIV CALCIUM PLUS D) 650-12.5-40 MG-MCG-MCG CHEW Chew 1 tablet by mouth in the morning, at noon, and at bedtime.    ? cephALEXin (KEFLEX) 500 MG capsule Take 500 mg by mouth 4 (four) times daily.    ? docusate sodium (COLACE) 100 MG capsule Take 100 mg by mouth 2 (two) times daily as needed for mild constipation.    ? famotidine (PEPCID) 20 MG tablet Take 20 mg by mouth daily.    ? fexofenadine (ALLEGRA) 180 MG tablet Take 180 mg by mouth in the morning.    ? fluticasone (FLONASE) 50 MCG/ACT nasal spray Place 2 sprays into both nostrils at bedtime.    ? gabapentin (NEURONTIN) 600 MG tablet     ? Lidocaine HCl-Benzyl Alcohol (SALONPAS LIDOCAINE PLUS EX) Place 1 patch onto the skin every 8 (eight) hours as needed (pain.).    ? Multiple Vitamins-Minerals (BARIATRIC MULTIVITAMINS/IRON PO) Take 1 tablet by mouth in the morning.     ? ondansetron (ZOFRAN-ODT) 8 MG disintegrating tablet Take 1 tablet (8 mg total) by mouth every 8 (eight) hours as needed for nausea or vomiting. 30 tablet 6  ? OXYCODONE ER PO Take 10 mg by mouth.    ? pantoprazole (PROTONIX) 40 MG tablet TAKE 1 TABLET BY MOUTH EVERY DAY FOR REFLUX 90 tablet 3  ? metoprolol tartrate (LOPRESSOR) 25 MG tablet Take 1 tablet (25 mg total) by mouth 2 (two) times daily. 180 tablet 3  ? ?No current facility-administered medications for this visit.  ? ? ?Allergies:   Other, Adhesive [tape], Latex, Nickel, Vancomycin, Augmentin [amoxicillin-pot clavulanate], and Neomycin-bacitracin zn-polymyx  ? ? ?  ROS:  Please see the history of present illness.   Otherwise, review of systems are positive for none.   All other systems are reviewed and negative.  ? ? ?PHYSICAL EXAM: ?VS:  BP 138/77   Pulse 66   Ht 5' (1.524 m)   Wt 211 lb 12.8 oz (96.1 kg)   SpO2 100%   BMI 41.36 kg/m?  , BMI Body mass index is 41.36 kg/m?. ?GEN:  No distress ?NECK:  No jugular venous distention at 90 degrees, waveform within normal limits, carotid upstroke brisk and symmetric, no bruits, no thyromegaly ?LYMPHATICS:  No cervical adenopathy ?LUNGS:  Clear to auscultation bilaterally ?BACK:  No CVA tenderness ?CHEST:  Unremarkable ?HEART:  S1 and S2 within normal limits, no S3, no S4, no clicks, no rubs, no murmurs ?ABD:  Positive bowel sounds normal in frequency in pitch, no bruits, no rebound, no guarding, unable to assess midline mass or bruit with the patient seated. ?EXT:  2 plus pulses throughout, trace  edema, no cyanosis no clubbing ?SKIN:  No rashes no nodules ?NEURO:  Cranial nerves II through XII grossly intact, motor grossly intact throughout ?PSYCH:  Cognitively intact, oriented to person place and time ? ? ?EKG:  EKG is not ordered today. ?NA ? ? ?Recent Labs: ?10/13/2020: BNP 38.7 ?03/28/2021: ALT 8; BUN 15; Creat 0.57; Potassium 4.2; Sodium 139 ?04/27/2021: Hemoglobin 11.0; Platelets 422  ? ? ?Lipid Panel ?    ?Component Value Date/Time  ? CHOL 162 03/05/2016 1041  ? CHOL 182 10/27/2014 0846  ? TRIG 126 03/05/2016 1041  ? HDL 58 03/05/2016 1041  ? HDL 76 10/27/2014 0846  ? CHOLHDL 2.8 03/05/2016 1041  ? VLDL 25 10

## 2021-09-18 ENCOUNTER — Encounter: Payer: Self-pay | Admitting: Cardiology

## 2021-09-18 ENCOUNTER — Ambulatory Visit (INDEPENDENT_AMBULATORY_CARE_PROVIDER_SITE_OTHER): Payer: PPO | Admitting: Cardiology

## 2021-09-18 VITALS — BP 138/77 | HR 66 | Ht 60.0 in | Wt 211.8 lb

## 2021-09-18 DIAGNOSIS — R072 Precordial pain: Secondary | ICD-10-CM

## 2021-09-18 DIAGNOSIS — R0602 Shortness of breath: Secondary | ICD-10-CM

## 2021-09-18 DIAGNOSIS — R002 Palpitations: Secondary | ICD-10-CM

## 2021-09-18 DIAGNOSIS — I10 Essential (primary) hypertension: Secondary | ICD-10-CM

## 2021-09-18 MED ORDER — METOPROLOL TARTRATE 25 MG PO TABS: 25.0000 mg | ORAL_TABLET | Freq: Two times a day (BID) | ORAL | 3 refills | Status: DC

## 2021-09-18 NOTE — Patient Instructions (Signed)
Medication Instructions:  ?Increase Metoprolol to 25 mg twice a day ? ?*If you need a refill on your cardiac medications before your next appointment, please call your pharmacy* ? ? ?Lab Work: ?None ordered today ? ? ?Testing/Procedures: ?None ordered today ? ? ?Follow-Up: ?At [facility], you and your health needs are our priority.  As part of our continuing mission to provide you with exceptional heart care, we have created designated Provider Care Teams.  These Care Teams include your primary Cardiologist (physician) and Advanced Practice Providers (APPs -  Physician Assistants and Nurse Practitioners) who all work together to provide you with the care you need, when you need it. ? ?We recommend signing up for the patient portal called "MyChart".  Sign up information is provided on this After Visit Summary.  MyChart is used to connect with patients for Virtual Visits (Telemedicine).  Patients are able to view lab/test results, encounter notes, upcoming appointments, etc.  Non-urgent messages can be sent to your provider as well.   ?To learn more about what you can do with MyChart, go to [email or web address].   ? ?Your next appointment:   ?6 month(s) ? ?The format for your next appointment:   ?In Person ? ?Provider:   ?Minus Breeding, MD { ? ? ? ?Important Information About Sugar ? ? ? ? ? ? ?

## 2021-09-19 ENCOUNTER — Encounter: Payer: Self-pay | Admitting: Cardiology

## 2021-10-30 ENCOUNTER — Encounter: Payer: Self-pay | Admitting: Family Medicine

## 2021-10-31 ENCOUNTER — Other Ambulatory Visit: Payer: Self-pay | Admitting: Family Medicine

## 2021-10-31 MED ORDER — POTASSIUM CHLORIDE CRYS ER 10 MEQ PO TBCR: 20.0000 meq | EXTENDED_RELEASE_TABLET | Freq: Every day | ORAL | 1 refills | Status: DC

## 2021-10-31 MED ORDER — FUROSEMIDE 20 MG PO TABS: 20.0000 mg | ORAL_TABLET | Freq: Every day | ORAL | 1 refills | Status: DC

## 2021-11-26 ENCOUNTER — Other Ambulatory Visit: Payer: Self-pay | Admitting: *Deleted

## 2021-11-26 MED ORDER — POTASSIUM CHLORIDE CRYS ER 10 MEQ PO TBCR: 20.0000 meq | EXTENDED_RELEASE_TABLET | Freq: Every day | ORAL | 0 refills | Status: DC

## 2021-12-25 ENCOUNTER — Other Ambulatory Visit: Payer: Self-pay | Admitting: Family Medicine

## 2021-12-26 ENCOUNTER — Other Ambulatory Visit: Payer: Self-pay | Admitting: *Deleted

## 2021-12-26 MED ORDER — FUROSEMIDE 20 MG PO TABS: 20.0000 mg | ORAL_TABLET | Freq: Every day | ORAL | 0 refills | Status: DC

## 2021-12-26 MED ORDER — POTASSIUM CHLORIDE CRYS ER 10 MEQ PO TBCR: EXTENDED_RELEASE_TABLET | ORAL | 0 refills | Status: DC

## 2022-01-14 ENCOUNTER — Encounter (HOSPITAL_COMMUNITY): Payer: Self-pay | Admitting: Hematology

## 2022-01-16 ENCOUNTER — Encounter: Payer: Self-pay | Admitting: Orthopaedic Surgery

## 2022-01-18 ENCOUNTER — Ambulatory Visit (INDEPENDENT_AMBULATORY_CARE_PROVIDER_SITE_OTHER): Payer: PPO | Admitting: Family Medicine

## 2022-01-18 DIAGNOSIS — T8452XD Infection and inflammatory reaction due to internal left hip prosthesis, subsequent encounter: Secondary | ICD-10-CM | POA: Diagnosis not present

## 2022-01-18 DIAGNOSIS — L304 Erythema intertrigo: Secondary | ICD-10-CM | POA: Diagnosis not present

## 2022-01-18 DIAGNOSIS — I10 Essential (primary) hypertension: Secondary | ICD-10-CM

## 2022-01-18 MED ORDER — FLUCONAZOLE 150 MG PO TABS: 150.0000 mg | ORAL_TABLET | ORAL | 1 refills | Status: DC

## 2022-01-18 NOTE — Patient Instructions (Signed)
Okay to stop the lasix and potassium if you like.  Diflucan sent.  Follow up in 6 months.

## 2022-01-20 DIAGNOSIS — L304 Erythema intertrigo: Secondary | ICD-10-CM | POA: Insufficient documentation

## 2022-01-20 NOTE — Assessment & Plan Note (Signed)
Diflucan as prescribed.

## 2022-01-20 NOTE — Assessment & Plan Note (Signed)
Doing well at this time. Continues on Keflex.

## 2022-01-20 NOTE — Progress Notes (Signed)
Subjective:  Patient ID: Sharon [name], female    DOB: [birth year]  Age: 66 y.o. MRN: [id number]  CC: Chief Complaint  Patient presents with   fluid in left leg    Follow up on lasix- patient state she is still gaining weight  Leg is still bothering her    HPI:  66 year old female who has been battling infection associated with left hip prosthesis following replacement presents for follow up.  Patient is doing quite well at this time. She is now ambulating with use of a walker. Follows with ID. Currently on Keflex 500 mg 4 times daily. She is still having pain associated with the left hip/left leg. Follows with pain management.   Hypertension is well controlled. She is on Metoprolol and lasix. She would like to discuss whether she should continue the lasix. Still having some swelling of the left thigh. This is likely going to be chronic due to multiple surgeries.   Patient reports yeast rash in the inguinal region. Requesting diflucan. Has used topicals without resolution.    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, subsequent encounter 01/02/2021   Seasonal and perennial allergic rhinitis 03/06/2017   Moderate persistent asthma, uncomplicated 09/73/5329   Chronic nonseasonal allergic rhinitis due to fungal spores 12/02/2016   Chronic urticaria 12/02/2016   Anaphylactic shock due to adverse food reaction 12/02/2016   Gastroesophageal reflux disease 08/08/2015   Status post right knee replacement 06/17/2011 06/17/2013   Status post left knee replacement 02/28/2007 06/17/2013   DDD (degenerative disc disease), lumbosacral 10/06/2012   Chronic pain 10/24/2010    Social Hx   Social History   Socioeconomic History   Marital status: Divorced    Spouse name: Not on file   Number  of children: Not on file   Years of education: Not on file   Highest education level: Associate degree: occupational, Hotel manager, or vocational program  Occupational History   Occupation: Therapist, sports at [employer] Use   Smoking status: Former    Packs/day: 1.00    Years: 25.00    Total pack years: 25.00    Types: Cigarettes    Start date: 08/02/1972    Quit date: 05/27/2006    Years since quitting: 15.6   Smokeless tobacco: Never  Vaping Use   Vaping Use: Never used  Substance and Sexual Activity   Alcohol use: Yes    Alcohol/week: 0.0 standard drinks of alcohol    Comment: occas.   Drug use: No   Sexual activity: Yes    Birth control/protection: Surgical  Other Topics Concern   Not on file  Social History Narrative   Not on file   Social Determinants of Health   Financial Resource Strain: High Risk (06/12/2021)   Overall Financial Resource Strain (CARDIA)    Difficulty of Paying Living Expenses: Very hard  Food Insecurity: Food Insecurity Present (06/12/2021)   Hunger Vital Sign    Worried About Running Out of Food in the Last Year: Often true    Ran Out of Food in the Last Year: Often true  Transportation Needs: Unmet Transportation Needs (06/12/2021)   PRAPARE - Hydrologist (Medical): Yes    Lack of Transportation (Non-Medical): No  Physical Activity: Inactive (06/12/2021)   Exercise Vital Sign  Days of Exercise per Week: 0 days    Minutes of Exercise per Session: 0 min  Stress: Stress Concern Present (06/12/2021)   [place]    Feeling of Stress : Very much  Social Connections: Socially Isolated (06/12/2021)   Social Connection and Isolation Panel [NHANES]    Frequency of Communication with Friends and Family: More than three times a week    Frequency of Social Gatherings with Friends and Family: Never    Attends Religious Services: Never    Marine scientist or  Organizations: No    Attends Music therapist: Never    Marital Status: Divorced    Review of Systems Per HPI  Objective:  BP 138/79   Pulse 78   Ht 5' (1.524 m)   Wt 227 lb (103 kg)   BMI 44.33 kg/m      01/18/2022    1:16 PM 09/18/2021    4:31 PM 06/12/2021    2:22 PM  BP/Weight  Systolic BP 790 240   Diastolic BP 79 77   Wt. (Lbs) 227 211.8 209  BMI 44.33 kg/m2 41.36 kg/m2 40.82 kg/m2    Physical Exam Constitutional:      General: She is not in acute distress.    Appearance: Normal appearance. She is obese.  HENT:     Head: Normocephalic and atraumatic.  Eyes:     General:        Right eye: No discharge.        Left eye: No discharge.     Conjunctiva/sclera: Conjunctivae normal.  Cardiovascular:     Rate and Rhythm: Normal rate and regular rhythm.  Pulmonary:     Effort: Pulmonary effort is normal.     Breath sounds: Normal breath sounds. No wheezing or rales.  Neurological:     Mental Status: She is alert.  Psychiatric:        Mood and Affect: Mood normal.        Behavior: Behavior normal.     Lab Results  Component Value Date   WBC 9.4 04/27/2021   HGB 11.0 (L) 04/27/2021   HCT 34.1 04/27/2021   PLT 422 04/27/2021   GLUCOSE 88 03/28/2021   CHOL 162 03/05/2016   TRIG 126 03/05/2016   HDL 58 03/05/2016   LDLCALC 79 03/05/2016   ALT 8 03/28/2021   AST 12 03/28/2021   NA 139 03/28/2021   K 4.2 03/28/2021   CL 101 03/28/2021   CREATININE 0.57 03/28/2021   BUN 15 03/28/2021   CO2 29 03/28/2021   TSH 3.100 08/29/2020   INR 0.95 06/13/2011     Assessment & Plan:   Problem List Items Addressed This Visit       Cardiovascular and Mediastinum   Essential hypertension    Stable. Continue Metoprolol. Can stop lasix.         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 Continues on Keflex.      Relevant Medications   fluconazole  (DIFLUCAN) 150 MG tablet   Intertrigo    Diflucan as prescribed.        Other   Morbid obesity ([place])    Meds ordered this encounter  Medications   fluconazole (DIFLUCAN) 150 MG tablet    Sig: Take 1 tablet (150 mg total) by mouth once a week.    Dispense:  4  tablet    Refill:  1    Follow-up:  Return in about 6 months (around 07/21/2022).  [place]

## 2022-01-20 NOTE — Assessment & Plan Note (Signed)
Stable. Continue Metoprolol. Can stop lasix.

## 2022-01-31 ENCOUNTER — Encounter: Payer: Self-pay | Admitting: Family Medicine

## 2022-01-31 ENCOUNTER — Other Ambulatory Visit: Payer: Self-pay | Admitting: Family Medicine

## 2022-02-01 ENCOUNTER — Other Ambulatory Visit: Payer: Self-pay | Admitting: Family Medicine

## 2022-02-01 MED ORDER — FUROSEMIDE 20 MG PO TABS: ORAL_TABLET | ORAL | 0 refills | Status: DC

## 2022-02-01 MED ORDER — POTASSIUM CHLORIDE CRYS ER 10 MEQ PO TBCR: EXTENDED_RELEASE_TABLET | ORAL | 0 refills | Status: DC

## 2022-02-18 ENCOUNTER — Encounter: Payer: Self-pay | Admitting: Family Medicine

## 2022-03-06 ENCOUNTER — Encounter (HOSPITAL_COMMUNITY): Payer: Self-pay | Admitting: *Deleted

## 2022-03-16 NOTE — Progress Notes (Unsigned)
Cardiology Office Note   Date:  03/19/2022   ID:  Sharon [name], Sharon [name] 1955-11-16, MRN [id number]  PCP:  Coral Spikes, DO  Cardiologist:   Minus Breeding, MD Referring:  Coral Spikes, DO  Chief Complaint  Patient presents with   Palpitations       History of Present Illness: Sharon [name] is a 66 y.o. female who was referred by Coral Spikes, DO for evaluation of palpitations.   Since I last saw her she is no better.  She was taking 25 twice a day of the metoprolol after the last visit because of her palpitations but she could not tolerate this as it drove her blood pressure down so she is only taking half of the 25 twice a day.  She occasionally takes an extra half if she is having increased palpitations and she does have some bad days.  For the most part though its not particular problematic.  Her blood pressures been well controlled.   The patient denies any new symptoms such as chest discomfort, neck or arm discomfort. There has been no new shortness of breath, PND or orthopnea. There have been no reported palpitations, presyncope or syncope.  She gets around with a walker having had hip surgery and revision in [place].  She says she is little more mobile these days that she is slowly recovered from extensive hospitalization and rehab.   Past Medical History:  Diagnosis Date   Anginal pain ([place])    in February 0737   Biliary colic    Cancer ([place])    melanoma: lt. inner thight   Carpal tunnel syndrome    bilateral   Chronic allergic rhinitis    Chronic urticaria    Complication of anesthesia    confusion for a couple of days after waking after having multiple knee surgery's, does well with spinal   Degenerative disk disease    Diverticulosis of colon    Dyspnea    Dysrhythmia    February 2022   Fatty liver    Fibrocystic breast disease 1996   GERD (gastroesophageal reflux disease)    History of colon polyps    HYPERPLASTIC 09-14-2015   History of  kidney stones    passed on own   History of melanoma excision    left thigh ,inside 2009--- per pt also has had severeal excision of office for almost melanoma   Hypertension    Migraine    since age 65 not as frequent now   Moderate persistent asthma, uncomplicated    followed by dr gallagher (cone allergy and asthma)   OA (osteoarthritis)    all joints   SUI (stress urinary incontinence, female)     Past Surgical History:  Procedure Laterality Date   ANTERIOR HIP REVISION Left 01/02/2021   Procedure: IRRIGATION AND DEBRIDEMENT LEFT HIP WITH HIP BALL AND POLY ETHYLENE LINER EXCHANGE.;  Surgeon: Mcarthur Rossetti, MD;  Location: [facility];  Service: Orthopedics;  Laterality: Left;  needs RNFA   APPENDECTOMY  1062   APPLICATION OF WOUND VAC Left 01/02/2021   Procedure: APPLICATION OF WOUND VAC;  Surgeon: Mcarthur Rossetti, MD;  Location: [facility];  Service: Orthopedics;  Laterality: Left;   BREAST EXCISIONAL BIOPSY Right 1994;  10-01-2009   2011 per path mild ductal hyperplasia/ fibrocystic breast   BREAST SURGERY     CHOLECYSTECTOMY N/A 01/30/2017   Procedure: LAPAROSCOPIC CHOLECYSTECTOMY;  Surgeon: Kieth Brightly Arta Bruce, MD;  Location: [facility]  [place] [facility];  Service: General;  Laterality: N/A;   COLONOSCOPY N/A 09/14/2015   Procedure: COLONOSCOPY;  Surgeon: Daneil Dolin, MD;  [facility];  Service: Endoscopy;  Laterality: N/A;  930 - moved to 9:15 - office to notify   ESOPHAGOGASTRODUODENOSCOPY (EGD) WITH PROPOFOL N/A 11/29/2016   Procedure: ESOPHAGOGASTRODUODENOSCOPY (EGD) WITH PROPOFOL;  Surgeon: Doran Stabler, MD;  [facility]: [facility];  Service: Gastroenterology;  Laterality: N/A;   JOINT REPLACEMENT     knee replacement and left hip replacement   KNEE ARTHROSCOPY W/ MENISCECTOMY Bilateral left 12-19-2006;  right 01-26-2010   LAPAROSCOPIC GASTRIC SLEEVE RESECTION N/A 08/16/2019   Procedure: LAPAROSCOPIC GASTRIC SLEEVE RESECTION, Upper Endo, ERAS  Pathway;  Surgeon: Kinsinger, Arta Bruce, MD;  Location: [facility];  Service: General;  Laterality: N/A;   [place] Left 04/14/2020   Procedure: LEFT TOTAL HIP ARTHROPLASTY ANTERIOR APPROACH;  Surgeon: Mcarthur Rossetti, MD;  [facility];  Service: Orthopedics;  Laterality: Left;   TOTAL KNEE ARTHROPLASTY Left 03/10/2007   TOTAL KNEE ARTHROPLASTY  06/17/2011   Procedure: TOTAL KNEE ARTHROPLASTY;  Surgeon: Arther Abbott, MD;  Location: [facility];  Service: Orthopedics;  Laterality: Right;  [place]   TOTAL KNEE REVISION Left 07/08/2010  (spinal anesthesia)   TUBAL LIGATION Bilateral YRS AGO   VAGINAL HYSTERECTOMY  1988     Current Outpatient Medications  Medication Sig Dispense Refill   albuterol (PROVENTIL) (2.5 MG/3ML) 0.083% nebulizer solution USE ONE VIAL IN NEBULIZER EVERY 4 HOURS AS NEEDED FOR WHEEZING 50 vial 2   albuterol (VENTOLIN HFA) 108 (90 Base) MCG/ACT inhaler Inhale 1-2 puffs into the lungs every 6 (six) hours as needed for wheezing or shortness of breath. 18 g 2   aspirin 81 MG chewable tablet 2 (two) times daily.     baclofen (LIORESAL) 10 MG tablet      Calcium-Vitamin D-Vitamin K (VIACTIV CALCIUM PLUS D) 650-12.5-40 MG-MCG-MCG CHEW Chew 1 tablet by mouth in the morning, at noon, and at bedtime.     cephALEXin (KEFLEX) 500 MG capsule Take 500 mg by mouth 4 (four) times daily.     docusate sodium (COLACE) 100 MG capsule Take 100 mg by mouth 2 (two) times daily as needed for mild constipation.     famotidine (PEPCID) 20 MG tablet Take 20 mg by mouth daily.     fexofenadine (ALLEGRA) 180 MG tablet Take 180 mg by mouth in the morning.     fluconazole (DIFLUCAN) 150 MG tablet Take 1 tablet (150 mg total) by mouth once a week. 4 tablet 1   fluticasone (FLONASE) 50 MCG/ACT nasal spray Place 2 sprays into both nostrils at bedtime.     furosemide (LASIX) 20 MG tablet TAKE 1 TABLET(20 MG) BY MOUTH DAILY 90 tablet 0   Lidocaine HCl-Benzyl  Alcohol (SALONPAS LIDOCAINE PLUS EX) Place 1 patch onto the skin every 8 (eight) hours as needed (pain.).     metoprolol tartrate (LOPRESSOR) 25 MG tablet Take 1 tablet (25 mg total) by mouth 2 (two) times daily. 180 tablet 3   Multiple Vitamins-Minerals (BARIATRIC MULTIVITAMINS/IRON PO) Take 1 tablet by mouth in the morning.     ondansetron (ZOFRAN-ODT) 8 MG disintegrating tablet Take 1 tablet (8 mg total) by mouth every 8 (eight) hours as needed for nausea or vomiting. 30 tablet 6   pantoprazole (PROTONIX) 40 MG tablet TAKE 1 TABLET BY MOUTH EVERY DAY FOR REFLUX 90 tablet 3   potassium chloride (  KLOR-CON M) 10 MEQ tablet TAKE 2 TABLETS(20 MEQ) BY MOUTH DAILY 180 tablet 0   gabapentin (NEURONTIN) 300 MG capsule Take 300 mg by mouth 3 (three) times daily.     No current facility-administered medications for this visit.    Allergies:   Other, Adhesive [tape], Latex, Nickel, Vancomycin, Augmentin [amoxicillin-pot clavulanate], and Neomycin-bacitracin zn-polymyx    ROS:  Please see the history of present illness.   Otherwise, review of systems are positive for none.   All other systems are reviewed and negative.    PHYSICAL EXAM: VS:  BP 126/62   Pulse 69   Ht 5' (1.524 m)   Wt 223 lb 12.8 oz (101.5 kg)   SpO2 97%   BMI 43.71 kg/m  , BMI Body mass index is 43.71 kg/m. GENERAL:  Well appearing NECK:  No jugular venous distention, waveform within normal limits, carotid upstroke brisk and symmetric, no bruits, no thyromegaly LUNGS:  Clear to auscultation bilaterally CHEST:  Unremarkable HEART:  PMI not displaced or sustained,S1 and S2 within normal limits, no S3, no S4, no clicks, no rubs, no murmurs ABD:  Flat, positive bowel sounds normal in frequency in pitch, no bruits, no rebound, no guarding, no midline pulsatile mass, no hepatomegaly, no splenomegaly EXT:  2 plus pulses throughout, no edema, no cyanosis no clubbing   EKG:  EKG is  ordered today. Sinus rhythm, rate 69, axis within  normal limits, intervals within normal limits, no acute ST-T wave changes.   Recent Labs: 03/28/2021: ALT 8; BUN 15; Creat 0.57; Potassium 4.2; Sodium 139 04/27/2021: Hemoglobin 11.0; Platelets 422    Lipid Panel    Component Value Date/Time   CHOL 162 03/05/2016 1041   CHOL 182 10/27/2014 0846   TRIG 126 03/05/2016 1041   HDL 58 03/05/2016 1041   HDL 76 10/27/2014 0846   CHOLHDL 2.8 03/05/2016 1041   VLDL 25 03/05/2016 1041   LDLCALC 79 03/05/2016 1041   LDLCALC 84 10/27/2014 0846      Wt Readings from Last 3 Encounters:  03/19/22 223 lb 12.8 oz (101.5 kg)  01/18/22 227 lb (103 kg)  09/18/21 211 lb 12.8 oz (96.1 kg)      Other studies Reviewed: Additional studies/ records that were reviewed today include: None . Review of the above records demonstrates:  NA  ASSESSMENT AND PLAN:  Palpitations: These are managed as above and we talked about as needed dosing of her beta-blocker.  No change in therapy.  HTN: Her blood pressure is currently controlled.  No change in therapy.   Current medicines are reviewed at length with the patient today.  The patient does not have concerns regarding medicines.  The following changes have been made: None   Labs/ tests ordered today include:  None  Orders Placed This Encounter  Procedures   EKG 12-Lead      Disposition:   FU with me in 12 months   Signed, Minus Breeding, MD  03/19/2022 5:16 PM    [place]

## 2022-03-19 ENCOUNTER — Encounter: Payer: Self-pay | Admitting: Cardiology

## 2022-03-19 ENCOUNTER — Ambulatory Visit: Payer: PPO | Attending: Cardiology | Admitting: Cardiology

## 2022-03-19 VITALS — BP 126/62 | HR 69 | Ht 60.0 in | Wt 223.8 lb

## 2022-03-19 DIAGNOSIS — I10 Essential (primary) hypertension: Secondary | ICD-10-CM | POA: Diagnosis not present

## 2022-03-19 DIAGNOSIS — R002 Palpitations: Secondary | ICD-10-CM

## 2022-03-19 DIAGNOSIS — R0602 Shortness of breath: Secondary | ICD-10-CM

## 2022-03-19 DIAGNOSIS — R072 Precordial pain: Secondary | ICD-10-CM | POA: Diagnosis not present

## 2022-03-19 NOTE — Patient Instructions (Signed)
Medication Instructions:  No changes *If you need a refill on your cardiac medications before your next appointment, please call your pharmacy*   Lab Work: None ordered If you have labs (blood work) drawn today and your tests are completely normal, you will receive your results only by: MyChart Message (if you have MyChart) OR A paper copy in the mail If you have any lab test that is abnormal or we need to change your treatment, we will call you to review the results.   Testing/Procedures: None ordered   Follow-Up: At [facility], you and your health needs are our priority.  As part of our continuing mission to provide you with exceptional heart care, we have created designated Provider Care Teams.  These Care Teams include your primary Cardiologist (physician) and Advanced Practice Providers (APPs -  Physician Assistants and Nurse Practitioners) who all work together to provide you with the care you need, when you need it.  We recommend signing up for the patient portal called "MyChart".  Sign up information is provided on this After Visit Summary.  MyChart is used to connect with patients for Virtual Visits (Telemedicine).  Patients are able to view lab/test results, encounter notes, upcoming appointments, etc.  Non-urgent messages can be sent to your provider as well.   To learn more about what you can do with MyChart, go to [email or web address].    Your next appointment:   12 month(s)  The format for your next appointment:   In Person  Provider:   James Hochrein, MD      Important Information About Sugar       

## 2022-04-10 ENCOUNTER — Encounter: Payer: Self-pay | Admitting: Family Medicine

## 2022-05-10 ENCOUNTER — Other Ambulatory Visit (HOSPITAL_COMMUNITY): Payer: Self-pay | Admitting: Family Medicine

## 2022-05-10 DIAGNOSIS — Z1231 Encounter for screening mammogram for malignant neoplasm of breast: Secondary | ICD-10-CM

## 2022-05-13 ENCOUNTER — Ambulatory Visit (HOSPITAL_COMMUNITY)
Admission: RE | Admit: 2022-05-13 | Discharge: 2022-05-13 | Disposition: A | Discharge status: Home / Self Care | Payer: PPO | Source: Ambulatory Visit | Attending: Family Medicine | Admitting: Family Medicine

## 2022-05-13 DIAGNOSIS — Z1231 Encounter for screening mammogram for malignant neoplasm of breast: Secondary | ICD-10-CM | POA: Diagnosis present

## 2022-05-17 ENCOUNTER — Other Ambulatory Visit (HOSPITAL_COMMUNITY): Payer: Self-pay | Admitting: Orthopaedic Surgery

## 2022-05-17 DIAGNOSIS — M25552 Pain in left hip: Secondary | ICD-10-CM

## 2022-05-17 DIAGNOSIS — T8459XA Infection and inflammatory reaction due to other internal joint prosthesis, initial encounter: Secondary | ICD-10-CM

## 2022-05-22 ENCOUNTER — Other Ambulatory Visit: Payer: Self-pay | Admitting: Family Medicine

## 2022-05-23 ENCOUNTER — Ambulatory Visit (HOSPITAL_COMMUNITY)
Admission: RE | Admit: 2022-05-23 | Discharge: 2022-05-23 | Disposition: A | Discharge status: Home / Self Care | Payer: PPO | Source: Ambulatory Visit | Attending: Orthopaedic Surgery | Admitting: Orthopaedic Surgery

## 2022-05-23 DIAGNOSIS — T8459XA Infection and inflammatory reaction due to other internal joint prosthesis, initial encounter: Secondary | ICD-10-CM | POA: Insufficient documentation

## 2022-05-23 DIAGNOSIS — Z96649 Presence of unspecified artificial hip joint: Secondary | ICD-10-CM | POA: Diagnosis present

## 2022-05-23 DIAGNOSIS — M25552 Pain in left hip: Secondary | ICD-10-CM | POA: Insufficient documentation

## 2022-05-24 ENCOUNTER — Other Ambulatory Visit: Payer: Self-pay | Admitting: Family Medicine

## 2022-06-19 ENCOUNTER — Other Ambulatory Visit (HOSPITAL_COMMUNITY): Payer: Self-pay | Admitting: Orthopaedic Surgery

## 2022-06-19 DIAGNOSIS — Z96642 Presence of left artificial hip joint: Secondary | ICD-10-CM

## 2022-06-21 ENCOUNTER — Other Ambulatory Visit (HOSPITAL_COMMUNITY): Payer: Self-pay | Admitting: Orthopaedic Surgery

## 2022-06-21 ENCOUNTER — Ambulatory Visit (HOSPITAL_COMMUNITY)
Admission: RE | Admit: 2022-06-21 | Discharge: 2022-06-21 | Disposition: A | Discharge status: Home / Self Care | Payer: PPO | Source: Ambulatory Visit | Attending: Orthopaedic Surgery | Admitting: Orthopaedic Surgery

## 2022-06-21 DIAGNOSIS — Z96642 Presence of left artificial hip joint: Secondary | ICD-10-CM | POA: Diagnosis present

## 2022-06-28 ENCOUNTER — Other Ambulatory Visit: Payer: Self-pay | Admitting: Family Medicine

## 2022-06-28 ENCOUNTER — Ambulatory Visit (INDEPENDENT_AMBULATORY_CARE_PROVIDER_SITE_OTHER): Payer: PPO

## 2022-06-28 VITALS — Ht 60.0 in | Wt 227.0 lb

## 2022-06-28 DIAGNOSIS — Z0000 Encounter for general adult medical examination without abnormal findings: Secondary | ICD-10-CM

## 2022-06-28 NOTE — Patient Instructions (Addendum)
Ms. Sharon [name] , Thank you for taking time to come for your Medicare Wellness Visit. I appreciate your ongoing commitment to your health goals. Please review the following plan we discussed and let me know if I can assist you in the future.   These are the goals we discussed:  Goals      Improve Mobility        This is a list of the screening recommended for you and due dates:  Health Maintenance  Topic Date Due   Hepatitis C Screening: USPSTF Recommendation to screen - Ages 59-79 yo.  Never done   Pneumonia Vaccine (2 - PCV) 08/03/2019   Flu Shot  12/25/2021   Medicare Annual Wellness Visit  06/29/2023   DTaP/Tdap/Td vaccine (2 - Tdap) 09/18/2023   Mammogram  05/13/2024   Colon Cancer Screening  09/13/2025   DEXA scan (bone density measurement)  Completed   Zoster (Shingles) Vaccine  Completed   HPV Vaccine  Aged Out   COVID-19 Vaccine  Discontinued    Advanced directives: Forms are available if you choose in the future to pursue completion.  This is recommended in order to make sure that your health wishes are honored in the event that you are unable to verbalize them to the provider.    Conditions/risks identified: Aim for 30 minutes of exercise or brisk walking, 6-8 glasses of water, and 5 servings of fruits and vegetables each day.   Next appointment: Follow up in one year for your annual wellness visit    Preventive Care 65 Years and Older, Female Preventive care refers to lifestyle choices and visits with your health care provider that can promote health and wellness. What does preventive care include? A yearly physical exam. This is also called an annual well check. Dental exams once or twice a year. Routine eye exams. Ask your health care provider how often you should have your eyes checked. Personal lifestyle choices, including: Daily care of your teeth and gums. Regular physical activity. Eating a healthy diet. Avoiding tobacco and drug use. Limiting alcohol  use. Practicing safe sex. Taking low-dose aspirin every day. Taking vitamin and mineral supplements as recommended by your health care provider. What happens during an annual well check? The services and screenings done by your health care provider during your annual well check will depend on your age, overall health, lifestyle risk factors, and family history of disease. Counseling  Your health care provider may ask you questions about your: Alcohol use. Tobacco use. Drug use. Emotional well-being. Home and relationship well-being. Sexual activity. Eating habits. History of falls. Memory and ability to understand (cognition). Work and work Statistician. Reproductive health. Screening  You may have the following tests or measurements: Height, weight, and BMI. Blood pressure. Lipid and cholesterol levels. These may be checked every 5 years, or more frequently if you are over 6 years old. Skin check. Lung cancer screening. You may have this screening every year starting at age 51 if you have a 30-pack-year history of smoking and currently smoke or have quit within the past 15 years. Fecal occult blood test (FOBT) of the stool. You may have this test every year starting at age 54. Flexible sigmoidoscopy or colonoscopy. You may have a sigmoidoscopy every 5 years or a colonoscopy every 10 years starting at age 101. Hepatitis C blood test. Hepatitis B blood test. Sexually transmitted disease (STD) testing. Diabetes screening. This is done by checking your blood sugar (glucose) after you have not eaten for a  while (fasting). You may have this done every 1-3 years. Bone density scan. This is done to screen for osteoporosis. You may have this done starting at age 48. Mammogram. This may be done every 1-2 years. Talk to your health care provider about how often you should have regular mammograms. Talk with your health care provider about your test results, treatment options, and if necessary,  the need for more tests. Vaccines  Your health care provider may recommend certain vaccines, such as: Influenza vaccine. This is recommended every year. Tetanus, diphtheria, and acellular pertussis (Tdap, Td) vaccine. You may need a Td booster every 10 years. Zoster vaccine. You may need this after age 72. Pneumococcal 13-valent conjugate (PCV13) vaccine. One dose is recommended after age 75. Pneumococcal polysaccharide (PPSV23) vaccine. One dose is recommended after age 49. Talk to your health care provider about which screenings and vaccines you need and how often you need them. This information is not intended to replace advice given to you by your health care provider. Make sure you discuss any questions you have with your health care provider. Document Released: 06/09/2015 Document Revised: 01/31/2016 Document Reviewed: 03/14/2015 Elsevier Interactive Patient Education  2017 [place] Prevention in the Home Falls can cause injuries. They can happen to people of all ages. There are many things you can do to make your home safe and to help prevent falls. What can I do on the outside of my home? Regularly fix the edges of walkways and driveways and fix any cracks. Remove anything that might make you trip as you walk through a door, such as a raised step or threshold. Trim any bushes or trees on the path to your home. Use bright outdoor lighting. Clear any walking paths of anything that might make someone trip, such as rocks or tools. Regularly check to see if handrails are loose or broken. Make sure that both sides of any steps have handrails. Any raised decks and porches should have guardrails on the edges. Have any leaves, snow, or ice cleared regularly. Use sand or salt on walking paths during winter. Clean up any spills in your garage right away. This includes oil or grease spills. What can I do in the bathroom? Use night lights. Install grab bars by the toilet and in the  tub and shower. Do not use towel bars as grab bars. Use non-skid mats or decals in the tub or shower. If you need to sit down in the shower, use a plastic, non-slip stool. Keep the floor dry. Clean up any water that spills on the floor as soon as it happens. Remove soap buildup in the tub or shower regularly. Attach bath mats securely with double-sided non-slip rug tape. Do not have throw rugs and other things on the floor that can make you trip. What can I do in the bedroom? Use night lights. Make sure that you have a light by your bed that is easy to reach. Do not use any sheets or blankets that are too big for your bed. They should not hang down onto the floor. Have a firm chair that has side arms. You can use this for support while you get dressed. Do not have throw rugs and other things on the floor that can make you trip. What can I do in the kitchen? Clean up any spills right away. Avoid walking on wet floors. Keep items that you use a lot in easy-to-reach places. If you need to reach something above you,  use a strong step stool that has a grab bar. Keep electrical cords out of the way. Do not use floor polish or wax that makes floors slippery. If you must use wax, use non-skid floor wax. Do not have throw rugs and other things on the floor that can make you trip. What can I do with my stairs? Do not leave any items on the stairs. Make sure that there are handrails on both sides of the stairs and use them. Fix handrails that are broken or loose. Make sure that handrails are as long as the stairways. Check any carpeting to make sure that it is firmly attached to the stairs. Fix any carpet that is loose or worn. Avoid having throw rugs at the top or bottom of the stairs. If you do have throw rugs, attach them to the floor with carpet tape. Make sure that you have a light switch at the top of the stairs and the bottom of the stairs. If you do not have them, ask someone to add them for  you. What else can I do to help prevent falls? Wear shoes that: Do not have high heels. Have rubber bottoms. Are comfortable and fit you well. Are closed at the toe. Do not wear sandals. If you use a stepladder: Make sure that it is fully opened. Do not climb a closed stepladder. Make sure that both sides of the stepladder are locked into place. Ask someone to hold it for you, if possible. Clearly mark and make sure that you can see: Any grab bars or handrails. First and last steps. Where the edge of each step is. Use tools that help you move around (mobility aids) if they are needed. These include: Canes. Walkers. Scooters. Crutches. Turn on the lights when you go into a dark area. Replace any light bulbs as soon as they burn out. Set up your furniture so you have a clear path. Avoid moving your furniture around. If any of your floors are uneven, fix them. If there are any pets around you, be aware of where they are. Review your medicines with your doctor. Some medicines can make you feel dizzy. This can increase your chance of falling. Ask your doctor what other things that you can do to help prevent falls. This information is not intended to replace advice given to you by your health care provider. Make sure you discuss any questions you have with your health care provider. Document Released: 03/09/2009 Document Revised: 10/19/2015 Document Reviewed: 06/17/2014 Elsevier Interactive Patient Education  2017 [employer].

## 2022-06-28 NOTE — Progress Notes (Signed)
Subjective:   Sharon [name] is a 67 y.o. female who presents for Medicare Annual (Subsequent) preventive examination.  I connected with  [name] on 06/28/22 by a audio enabled telemedicine application and verified that I am speaking with the correct person using two identifiers.  Patient Location: Home  Provider Location: Office/Clinic  I discussed the limitations of evaluation and management by telemedicine. The patient expressed understanding and agreed to proceed.  Review of Systems     Cardiac Risk Factors include: advanced age (>6mn, >>12women);hypertension;obesity (BMI >30kg/m2);sedentary lifestyle     Objective:    Today's Vitals   06/28/22 1433  Weight: 227 lb (103 kg)  Height: 5' (1.524 m)   Body mass index is 44.33 kg/m.     06/28/2022    2:36 PM 06/12/2021    2:38 PM 05/01/2021    3:21 PM 01/02/2021    2:23 PM 04/14/2020   11:19 AM 04/14/2020    6:17 AM 04/11/2020   10:17 AM  Advanced Directives  Does Patient Have a Medical Advance Directive? No Yes Yes Yes Yes Yes Yes  Type of ASocial research officer, governmentLiving will HCorralesLiving will HIonaLiving will HDonaldsonLiving will HKakaLiving will HNew ChurchLiving will  Does patient want to make changes to medical advance directive?   No - Patient declined No - Patient declined No - Patient declined    Copy of [place] Chart?  No - copy requested No - copy requested No - copy requested No - copy requested    Would patient like information on creating a medical advance directive? No - Patient declined          Current Medications (verified) Outpatient Encounter Medications as of 06/28/2022  Medication Sig   albuterol (PROVENTIL) (2.5 MG/3ML) 0.083% nebulizer solution USE ONE VIAL IN NEBULIZER EVERY 4 HOURS AS NEEDED FOR WHEEZING   albuterol (VENTOLIN HFA)  108 (90 Base) MCG/ACT inhaler Inhale 1-2 puffs into the lungs every 6 (six) hours as needed for wheezing or shortness of breath.   aspirin 81 MG chewable tablet 2 (two) times daily.   baclofen (LIORESAL) 10 MG tablet    Calcium-Vitamin D-Vitamin K (VIACTIV CALCIUM PLUS D) 650-12.5-40 MG-MCG-MCG CHEW Chew 1 tablet by mouth in the morning, at noon, and at bedtime.   CELEBREX 100 MG capsule    cephALEXin (KEFLEX) 500 MG capsule Take 500 mg by mouth 4 (four) times daily.   docusate sodium (COLACE) 100 MG capsule Take 100 mg by mouth 2 (two) times daily as needed for mild constipation.   famotidine (PEPCID) 20 MG tablet Take 20 mg by mouth daily.   fexofenadine (ALLEGRA) 180 MG tablet Take 180 mg by mouth in the morning.   fluconazole (DIFLUCAN) 150 MG tablet Take 1 tablet (150 mg total) by mouth once a week.   fluticasone (FLONASE) 50 MCG/ACT nasal spray Place 2 sprays into both nostrils at bedtime.   furosemide (LASIX) 20 MG tablet TAKE 1 TABLET(20 MG) BY MOUTH DAILY   gabapentin (NEURONTIN) 300 MG capsule Take 300 mg by mouth 3 (three) times daily.   Lidocaine HCl-Benzyl Alcohol (SALONPAS LIDOCAINE PLUS EX) Place 1 patch onto the skin every 8 (eight) hours as needed (pain.).   metoprolol tartrate (LOPRESSOR) 25 MG tablet Take 1 tablet (25 mg total) by mouth 2 (two) times daily.   Multiple Vitamins-Minerals (BARIATRIC MULTIVITAMINS/IRON PO) Take 1  tablet by mouth in the morning.   ondansetron (ZOFRAN-ODT) 8 MG disintegrating tablet Take 1 tablet (8 mg total) by mouth every 8 (eight) hours as needed for nausea or vomiting.   pantoprazole (PROTONIX) 40 MG tablet TAKE 1 TABLET BY MOUTH EVERY DAY FOR REFLUX   potassium chloride (KLOR-CON M) 10 MEQ tablet TAKE 2 TABLETS(20 MEQ) BY MOUTH DAILY   No facility-administered encounter medications on file as of 06/28/2022.    Allergies (verified) Other, Adhesive [tape], Latex, Nickel, Vancomycin, Augmentin [amoxicillin-pot clavulanate], and  Neomycin-bacitracin zn-polymyx   History: Past Medical History:  Diagnosis Date   Anginal pain ([place])    in February 6378   Biliary colic    Cancer ([place])    melanoma: lt. inner thight   Carpal tunnel syndrome    bilateral   Chronic allergic rhinitis    Chronic urticaria    Complication of anesthesia    confusion for a couple of days after waking after having multiple knee surgery's, does well with spinal   Degenerative disk disease    Diverticulosis of colon    Dyspnea    Dysrhythmia    February 2022   Fatty liver    Fibrocystic breast disease 1996   GERD (gastroesophageal reflux disease)    History of colon polyps    HYPERPLASTIC 09-14-2015   History of kidney stones    passed on own   History of melanoma excision    left thigh ,inside 2009--- per pt also has had severeal excision of office for almost melanoma   Hypertension    Migraine    since age 58 not as frequent now   Moderate persistent asthma, uncomplicated    followed by dr gallagher ([place] allergy and asthma)   OA (osteoarthritis)    all joints   SUI (stress urinary incontinence, female)    Past Surgical History:  Procedure Laterality Date   ANTERIOR HIP REVISION Left 01/02/2021   Procedure: IRRIGATION AND DEBRIDEMENT LEFT HIP WITH HIP BALL AND POLY ETHYLENE LINER EXCHANGE.;  Surgeon: Mcarthur Rossetti, MD;  Location: [facility];  Service: Orthopedics;  Laterality: Left;  needs RNFA   APPENDECTOMY  5885   APPLICATION OF WOUND VAC Left 01/02/2021   Procedure: APPLICATION OF WOUND VAC;  Surgeon: Mcarthur Rossetti, MD;  Location: [facility];  Service: Orthopedics;  Laterality: Left;   BREAST EXCISIONAL BIOPSY Right 1994;  10-01-2009   2011 per path mild ductal hyperplasia/ fibrocystic breast   BREAST SURGERY     CHOLECYSTECTOMY N/A 01/30/2017   Procedure: LAPAROSCOPIC CHOLECYSTECTOMY;  Surgeon: Kieth Brightly Arta Bruce, MD;  Location: [facility];  Service: General;  Laterality: N/A;   COLONOSCOPY  N/A 09/14/2015   Procedure: COLONOSCOPY;  Surgeon: Daneil Dolin, MD;  [facility];  Service: Endoscopy;  Laterality: N/A;  930 - moved to 9:15 - office to notify   ESOPHAGOGASTRODUODENOSCOPY (EGD) WITH PROPOFOL N/A 11/29/2016   Procedure: ESOPHAGOGASTRODUODENOSCOPY (EGD) WITH PROPOFOL;  Surgeon: Doran Stabler, MD;  [facility];  Service: Gastroenterology;  Laterality: N/A;   JOINT REPLACEMENT     knee replacement and left hip replacement   KNEE ARTHROSCOPY W/ MENISCECTOMY Bilateral left 12-19-2006;  right 01-26-2010   LAPAROSCOPIC GASTRIC SLEEVE RESECTION N/A 08/16/2019   Procedure: LAPAROSCOPIC GASTRIC SLEEVE RESECTION, Upper Endo, ERAS Pathway;  Surgeon: Kinsinger, Arta Bruce, MD;  Location: [facility];  Service: General;  Laterality: N/A;   [place] Left 04/14/2020   Procedure:  LEFT TOTAL HIP ARTHROPLASTY ANTERIOR APPROACH;  Surgeon: Mcarthur Rossetti, MD;  [facility];  Service: Orthopedics;  Laterality: Left;   TOTAL KNEE ARTHROPLASTY Left 03/10/2007   TOTAL KNEE ARTHROPLASTY  06/17/2011   Procedure: TOTAL KNEE ARTHROPLASTY;  Surgeon: Arther Abbott, MD;  Location: [facility];  Service: Orthopedics;  Laterality: Right;  [place]   TOTAL KNEE REVISION Left 07/08/2010  (spinal anesthesia)   TUBAL LIGATION Bilateral YRS AGO   VAGINAL HYSTERECTOMY  1988   Family History  Problem Relation Age of Onset   Hypertension Father    Lung cancer Father 46       lung cancer   Lung disease Other        family history    Allergic rhinitis Daughter    Food Allergy Grandchild    Anesthesia problems Neg Hx    Hypotension Neg Hx    Malignant hyperthermia Neg Hx    Pseudochol deficiency Neg Hx    Colon cancer Neg Hx    Angioedema Neg Hx    Asthma Neg Hx    Eczema Neg Hx    Immunodeficiency Neg Hx    Urticaria Neg Hx    Social History   Socioeconomic History   Marital status: Divorced    Spouse name: Not on file   Number  of children: Not on file   Years of education: Not on file   Highest education level: Associate degree: occupational, Hotel manager, or vocational program  Occupational History   Occupation: Therapist, 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   Drug use: No   Sexual activity: Yes    Birth control/protection: Surgical  Other Topics Concern   Not on file  Social History Narrative   Not on file   Social Determinants of Health   Financial Resource Strain: Low Risk  (06/28/2022)   Overall Financial Resource Strain (CARDIA)    Difficulty of Paying Living Expenses: Not very hard  Food Insecurity: Food Insecurity Present (06/28/2022)   Hunger Vital Sign    Worried About [place] in the Last Year: Sometimes true    Ran Out of Food in the Last Year: Sometimes true  Transportation Needs: No Transportation Needs (06/28/2022)   PRAPARE - Hydrologist (Medical): No    Lack of Transportation (Non-Medical): No  Physical Activity: Inactive (06/28/2022)   Exercise Vital Sign    Days of Exercise per Week: 0 days    Minutes of Exercise per Session: 0 min  Stress: No Stress Concern Present (06/28/2022)   [place]    Feeling of Stress : Not at all  Social Connections: Moderately Integrated (06/28/2022)   Social Connection and Isolation Panel [NHANES]    Frequency of Communication with Friends and Family: More than three times a week    Frequency of Social Gatherings with Friends and Family: Not on file    Attends Religious Services: 1 to 4 times per year  Active Member of Clubs or Organizations: Not on file    Attends Club or Organization Meetings: 1 to 4 times per year    Marital Status: Divorced    Tobacco Counseling Counseling given: Not Answered   Clinical Intake:  Pre-visit preparation completed: Yes  Pain : No/denies pain  Diabetes: No  How often do you need to have someone help you when you read instructions, pamphlets, or other written materials from your doctor or pharmacy?: 2 - Rarely  Diabetic?No   Interpreter Needed?: No  Information entered by :: Denman George LPN   Activities of Daily Living    06/28/2022    2:37 PM 06/25/2022    2:31 PM  In your present state of health, do you have any difficulty performing the following activities:  Hearing? 0 0  Vision? 0 1  Difficulty concentrating or making decisions? 1 1  Comment due to side effects from medication   Walking or climbing stairs? 1 1  Dressing or bathing? 0 0  Doing errands, shopping? 1 1  Preparing Food and eating ? N N  Using the Toilet? N N  In the past six months, have you accidently leaked urine? Y Y  Do you have problems with loss of bowel control? N N  Managing your Medications? N N  Managing your Finances? N Y  Housekeeping or managing your Housekeeping? Tempie Donning    Patient Care Team: Coral Spikes, DO as PCP - General (Family Medicine) Minus Breeding, MD as PCP - Cardiology (Cardiology) Derek Jack, MD as Medical Oncologist (Hematology) Springer, Harvel Ricks, MD as Referring Physician (Orthopedic Surgery)  Indicate any recent Medical Services you may have received from other than [facility] providers in the past year (date may be approximate).     Assessment:   This is a routine wellness examination for [place].  Hearing/Vision screen Hearing Screening - Comments:: Denies hearing difficulties   Vision Screening - Comments:: Wears rx glasses - will schedule routine eye exam soon      Dietary issues and exercise activities  discussed: Current Exercise Habits: The patient does not participate in regular exercise at present   Goals Addressed             This Visit's Progress    Improve Mobility        Depression Screen    06/28/2022    2:39 PM 06/12/2021    2:34 PM 04/27/2021    2:20 PM 09/01/2020    8:52 AM 08/18/2020    9:31 AM 03/24/2019    2:08 PM 06/04/2017   10:00 AM  PHQ 2/9 Scores  PHQ - 2 Score 0 2 0 0 0 0 0  PHQ- 9 Score  4         Fall Risk    06/28/2022    2:35 PM 06/25/2022    2:31 PM 01/18/2022    1:22 PM 06/12/2021    2:39 PM 06/10/2021    2:18 PM  [place] in the past year? 0 0  1 1  Number falls in past yr: 0 0 0 1 0  Injury with Fall? 0 0  1 1  Risk for fall due to :   Impaired balance/gait;Impaired mobility Impaired balance/gait;Impaired mobility;History of fall(s)   Follow up Falls prevention discussed;Education provided;Falls evaluation completed  Falls evaluation completed      FALL RISK PREVENTION PERTAINING TO THE HOME:  Any stairs in or around the home? No  If so, are  there any without handrails? No  Home free of loose throw rugs in walkways, pet beds, electrical cords, etc? Yes  Adequate lighting in your home to reduce risk of falls? Yes   ASSISTIVE DEVICES UTILIZED TO PREVENT FALLS:  Life alert? No  Use of a cane, walker or w/c? Yes  Grab bars in the bathroom? Yes  Shower chair or bench in shower? Yes  Elevated toilet seat or a handicapped toilet? Yes   TIMED UP AND GO:  Was the test performed? No . Telephonic visit   Cognitive Function:        06/28/2022    2:37 PM  6CIT Screen  What Year? 0 points  What month? 0 points  What time? 0 points  Count back from 20 0 points  Months in reverse 0 points  Repeat phrase 0 points  Total Score 0 points    Immunizations Immunization History  Administered Date(s) Administered   Influenza,inj,Quad PF,6+ Mos 03/04/2016, 03/19/2017, 02/04/2018   Influenza-Unspecified 04/27/2011, 03/08/2014, 03/27/2015,  03/27/2015, 03/19/2017, 03/19/2017, 02/12/2019   Pneumococcal Polysaccharide-23 02/24/2010   Td 09/17/2013   Zoster Recombinat (Shingrix) 03/19/2017, 02/12/2019    TDAP status: Up to date  Flu Vaccine status: Due, Education has been provided regarding the importance of this vaccine. Advised may receive this vaccine at local pharmacy or Health Dept. Aware to provide a copy of the vaccination record if obtained from local pharmacy or Health Dept. Verbalized acceptance and understanding.  Pneumococcal vaccine status: Up to date  Covid-19 vaccine status: Information provided on how to obtain vaccines.   Qualifies for Shingles Vaccine? Yes   Zostavax completed No   Shingrix Completed?: Yes  Screening Tests Health Maintenance  Topic Date Due   Hepatitis C Screening  Never done   Pneumonia Vaccine 71+ Years old (2 - PCV) 08/03/2019   INFLUENZA VACCINE  12/25/2021   Medicare Annual Wellness (AWV)  06/29/2023   DTaP/Tdap/Td (2 - Tdap) 09/18/2023   MAMMOGRAM  05/13/2024   COLONOSCOPY (Pts 45-61yr Insurance coverage will need to be confirmed)  09/13/2025   DEXA SCAN  Completed   Zoster Vaccines- Shingrix  Completed   HPV VACCINES  Aged Out   COVID-19 Vaccine  Discontinued    Health Maintenance  Health Maintenance Due  Topic Date Due   Hepatitis C Screening  Never done   Pneumonia Vaccine 68 Years old (2 - PCV) 08/03/2019   INFLUENZA VACCINE  12/25/2021    Colorectal cancer screening: Type of screening: Colonoscopy. Completed 09/14/15. Repeat every 10 years  Mammogram status: Completed 05/13/22. Repeat every year  Bone Density status: Completed 10/28/14. Results reflect: Bone density results: NORMAL. Repeat every 5 years.  Lung Cancer Screening: (Low Dose CT Chest recommended if Age 392-80years, 30 pack-year currently smoking OR have quit w/in 15years.) does not qualify.   Lung Cancer Screening Referral: n/a   Additional Screening:  Hepatitis C Screening: does qualify;  Completed at next office visit   Vision Screening: Recommended annual ophthalmology exams for early detection of glaucoma and other disorders of the eye. Is the patient up to date with their annual eye exam?  No  Who is the provider or what is the name of the office in which the patient attends annual eye exams? None  If pt is not established with a provider, would they like to be referred to a provider to establish care? No .   Dental Screening: Recommended annual dental exams for proper oral hygiene  Community Resource Referral / Chronic  Care Management: CRR required this visit?  No   CCM required this visit?  No      Plan:     I have personally reviewed and noted the following in the patient's chart:   Medical and social history Use of alcohol, tobacco or illicit drugs  Current medications and supplements including opioid prescriptions. Patient is currently taking opioid prescriptions. Information provided to patient regarding non-opioid alternatives. Patient advised to discuss non-opioid treatment plan with their provider. Functional ability and status Nutritional status Physical activity Advanced directives List of other physicians Hospitalizations, surgeries, and ER visits in previous 12 months Vitals Screenings to include cognitive, depression, and falls Referrals and appointments  In addition, I have reviewed and discussed with patient certain preventive protocols, quality metrics, and best practice recommendations. A written personalized care plan for preventive services as well as general preventive health recommendations were provided to patient.     Vanetta Mulders, [place]   05/31/7827   Due to this being a virtual visit, the after visit summary with patients personalized plan was offered to patient via mail or my-chart.  Patient would like to access on my-chart  Nurse Notes: No concerns

## 2022-07-10 ENCOUNTER — Ambulatory Visit (HOSPITAL_COMMUNITY): Admission: RE | Admit: 2022-07-10 | Payer: PPO | Source: Ambulatory Visit

## 2022-07-12 ENCOUNTER — Other Ambulatory Visit: Payer: Self-pay | Admitting: Family Medicine

## 2022-07-22 ENCOUNTER — Encounter: Payer: Self-pay | Admitting: Family Medicine

## 2022-07-22 ENCOUNTER — Ambulatory Visit (INDEPENDENT_AMBULATORY_CARE_PROVIDER_SITE_OTHER): Payer: PPO | Admitting: Family Medicine

## 2022-07-22 VITALS — BP 121/72 | HR 77 | Temp 98.0°F | Ht 60.0 in | Wt 222.0 lb

## 2022-07-22 DIAGNOSIS — T8452XD Infection and inflammatory reaction due to internal left hip prosthesis, subsequent encounter: Secondary | ICD-10-CM | POA: Diagnosis not present

## 2022-07-22 NOTE — Progress Notes (Signed)
Subjective:  Patient ID: Sharon [name], female    DOB: January 13, 1956  Age: 67 y.o. MRN: [id number]  CC: Chief Complaint  Patient presents with   Follow-up    Had medicare wellness on 06/28/22    HPI:  67 year old female with a complicated past medical history presents for follow-up.  Patient needs form filled out and office notes attached regarding her need for a wheelchair.  Patient has had previous left hip replacement with subsequent chronic infection afterwards.  She has since then had multiple surgeries due to underlying infection.  She is followed by [facility] and is also followed by infectious disease.  Patient is in need of a manual wheelchair.  She has limited mobility due to her prior orthopedic surgeries.  Her mobility issues are not resolved by using a cane, crutches, or a walker.  These pose her a risk to fall.  She is able to safely operate a manual wheelchair and has good upper extremity strength.  She does not have to self propel with her feet.  She can use her arms.  She needs a skin protection cushion as she has had prior skin breakdown issues following her surgeries.  She has extensive scarring.  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, subsequent encounter 01/02/2021   Seasonal and perennial allergic rhinitis 03/06/2017   Moderate persistent asthma, uncomplicated [id number]   Chronic urticaria 12/02/2016   Anaphylactic shock due to adverse food reaction 12/02/2016   Gastroesophageal reflux disease 08/08/2015   Status post right knee replacement 06/17/2011 06/17/2013   Status post left knee replacement 02/28/2007 06/17/2013   DDD (degenerative disc disease), lumbosacral 10/06/2012   Chronic pain 10/24/2010    Social Hx   Social History    Socioeconomic History   Marital status: Divorced    Spouse name: Not on file   Number of children: Not on file   Years of education: Not on file   Highest education level: Associate degree: occupational, Hotel manager, or vocational program  Occupational History   Occupation: Therapist, 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   Drug use: No   Sexual activity: Yes    Birth control/protection: Surgical  Other Topics Concern   Not on file  Social History Narrative   Not on file   Social Determinants of Health   Financial Resource Strain: Low Risk  (06/28/2022)   Overall Financial Resource Strain (CARDIA)    Difficulty of Paying Living Expenses: Not very hard  Food Insecurity: Food Insecurity Present (06/28/2022)   Hunger Vital Sign    Worried About [place] in the Last Year: Sometimes true    Ran Out of Food in the Last Year: Sometimes true  Transportation Needs: No Transportation Needs (06/28/2022)   PRAPARE - Hydrologist (Medical):  No    Lack of Transportation (Non-Medical): No  Physical Activity: Inactive (06/28/2022)   Exercise Vital Sign    Days of Exercise per Week: 0 days    Minutes of Exercise per Session: 0 min  Stress: No Stress Concern Present (06/28/2022)   [place]    Feeling of Stress : Not at all  Social Connections: Moderately Integrated (06/28/2022)   Social Connection and Isolation Panel [NHANES]    Frequency of Communication with Friends and Family: More than three times a week    Frequency of Social Gatherings with  Friends and Family: Not on file    Attends Religious Services: 1 to 4 times per year    Active Member of [employer] or Organizations: Not on file    Attends Archivist Meetings: 1 to 4 times per year    Marital Status: Divorced    Review of Systems Per HPI  Objective:  BP 121/72   Pulse 77   Temp 98 F (36.7 C)   Ht 5' (1.524 m)   Wt 222 lb (100.7 kg)   SpO2 98%   BMI 43.36 kg/m      07/22/2022    1:06 PM 06/28/2022    2:33 PM 03/19/2022    3:31 PM  BP/Weight  Systolic BP 123XX123  123XX123  Diastolic BP 72  62  Wt. (Lbs) 222 227 223.8  BMI 43.36 kg/m2 44.33 kg/m2 43.71 kg/m2    Physical Exam Vitals and nursing note reviewed.  Constitutional:      General: She is not in acute distress.    Appearance: Normal appearance. She is obese.  HENT:     Head: Normocephalic and atraumatic.  Cardiovascular:     Rate and Rhythm: Normal rate and regular rhythm.  Pulmonary:     Effort: Pulmonary effort is normal.     Breath sounds: Normal breath sounds. No wheezing, rhonchi or rales.  Skin:    Comments: Surgical scars with surrounding erythema.  There is warmth as well.  Neurological:     Mental Status: She is alert.  Psychiatric:        Mood and Affect: Mood normal.        Behavior: Behavior normal.     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  Infectious disease will be changing her antibiotic therapy.  Patient is in need of a manual wheelchair.  I am filling out documentation to reflect the need and sending in my note regarding this.      Follow-up:  6 months  [place]

## 2022-07-22 NOTE — Assessment & Plan Note (Signed)
Patient's inflammatory markers have risen.  Infectious disease will be changing her antibiotic therapy.  Patient is in need of a manual wheelchair.  I am filling out documentation to reflect the need and sending in my note regarding this.

## 2022-07-25 ENCOUNTER — Encounter: Payer: Self-pay | Admitting: Radiology

## 2022-08-12 ENCOUNTER — Other Ambulatory Visit: Payer: Self-pay | Admitting: Family Medicine

## 2022-09-08 ENCOUNTER — Other Ambulatory Visit: Payer: Self-pay | Admitting: Family Medicine

## 2022-10-29 ENCOUNTER — Encounter: Payer: Self-pay | Admitting: Family Medicine

## 2022-10-29 ENCOUNTER — Other Ambulatory Visit: Payer: Self-pay | Admitting: Family Medicine

## 2022-10-29 ENCOUNTER — Encounter: Payer: Self-pay | Admitting: Cardiology

## 2022-10-29 MED ORDER — METOPROLOL TARTRATE 25 MG PO TABS: 25.0000 mg | ORAL_TABLET | Freq: Two times a day (BID) | ORAL | 2 refills | Status: DC

## 2022-10-29 MED ORDER — PANTOPRAZOLE SODIUM 40 MG PO TBEC: DELAYED_RELEASE_TABLET | ORAL | 0 refills | Status: DC

## 2022-11-18 ENCOUNTER — Other Ambulatory Visit: Payer: Self-pay | Admitting: Family Medicine

## 2022-12-25 ENCOUNTER — Other Ambulatory Visit: Payer: Self-pay | Admitting: Family Medicine

## 2022-12-26 ENCOUNTER — Other Ambulatory Visit: Payer: Self-pay | Admitting: Family Medicine

## 2022-12-26 ENCOUNTER — Encounter: Payer: Self-pay | Admitting: Family Medicine

## 2022-12-26 MED ORDER — FLUCONAZOLE 150 MG PO TABS: 150.0000 mg | ORAL_TABLET | ORAL | 0 refills | Status: DC

## 2022-12-27 ENCOUNTER — Other Ambulatory Visit: Payer: Self-pay | Admitting: Family Medicine

## 2023-01-01 NOTE — Progress Notes (Signed)
This encounter was created in error - please disregard.

## 2023-01-20 ENCOUNTER — Ambulatory Visit: Payer: PPO | Admitting: Family Medicine

## 2023-01-30 ENCOUNTER — Other Ambulatory Visit: Payer: Self-pay | Admitting: Family Medicine

## 2023-02-06 ENCOUNTER — Encounter: Payer: Self-pay | Admitting: Family Medicine

## 2023-02-07 ENCOUNTER — Other Ambulatory Visit: Payer: Self-pay

## 2023-02-07 MED ORDER — PANTOPRAZOLE SODIUM 40 MG PO TBEC: DELAYED_RELEASE_TABLET | ORAL | 1 refills | Status: DC

## 2023-03-12 ENCOUNTER — Encounter (HOSPITAL_COMMUNITY): Payer: Self-pay | Admitting: *Deleted

## 2023-04-08 ENCOUNTER — Other Ambulatory Visit: Payer: Self-pay | Admitting: Family Medicine

## 2023-04-30 ENCOUNTER — Other Ambulatory Visit: Payer: Self-pay | Admitting: Family Medicine

## 2023-04-30 MED ORDER — POTASSIUM CHLORIDE CRYS ER 10 MEQ PO TBCR: EXTENDED_RELEASE_TABLET | ORAL | 0 refills | Status: DC

## 2023-05-01 ENCOUNTER — Other Ambulatory Visit: Payer: Self-pay

## 2023-05-01 ENCOUNTER — Encounter: Payer: Self-pay | Admitting: Family Medicine

## 2023-05-01 MED ORDER — ALBUTEROL SULFATE HFA 108 (90 BASE) MCG/ACT IN AERS: 1.0000 | INHALATION_SPRAY | Freq: Four times a day (QID) | RESPIRATORY_TRACT | 2 refills | Status: AC | PRN

## 2023-05-16 ENCOUNTER — Ambulatory Visit: Payer: PPO | Admitting: Family Medicine

## 2023-05-16 ENCOUNTER — Ambulatory Visit (HOSPITAL_COMMUNITY)
Admission: RE | Admit: 2023-05-16 | Discharge: 2023-05-16 | Disposition: A | Discharge status: Home / Self Care | Payer: PPO | Source: Ambulatory Visit | Attending: Family Medicine | Admitting: Family Medicine

## 2023-05-16 VITALS — BP 117/70 | HR 82 | Temp 98.6°F | Ht 60.0 in | Wt 233.0 lb

## 2023-05-16 DIAGNOSIS — R051 Acute cough: Secondary | ICD-10-CM | POA: Insufficient documentation

## 2023-05-16 MED ORDER — PROMETHAZINE-DM 6.25-15 MG/5ML PO SYRP: 5.0000 mL | ORAL_SOLUTION | Freq: Four times a day (QID) | ORAL | 0 refills | Status: DC | PRN

## 2023-05-16 NOTE — Patient Instructions (Signed)
Medication as directed for cough.  Chest xray today.  Take care  Dr. Adriana Simas

## 2023-05-18 ENCOUNTER — Encounter: Payer: Self-pay | Admitting: Family Medicine

## 2023-05-18 NOTE — Progress Notes (Unsigned)
Subjective:  Patient ID: Sharon [name], female    DOB: Oct 25, 1955  Age: 67 y.o. MRN: [id number]  CC:   Chief Complaint  Patient presents with   Cough    Not productive -Chest congestion, sore throat and chest   sinus congestion     Headache no fever    HPI:  67 year old female presents for evaluation of the above.  Respiratory symptoms 2 day history of symptoms. Reports cough and chest congestion.  Cough is nonproductive. She has also had some laryngitis and headache as well as sore throat.  No fever. No relieving factors. No known exacerbating factors.  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, subsequent encounter 01/02/2021   Seasonal and perennial allergic rhinitis 03/06/2017   Moderate persistent asthma, uncomplicated 12/02/2016   Chronic urticaria 12/02/2016   Anaphylactic shock due to adverse food reaction 12/02/2016   Gastroesophageal reflux disease 08/08/2015   Status post right knee replacement 06/17/2011 06/17/2013   Status post left knee replacement 02/28/2007 06/17/2013   DDD (degenerative disc disease), lumbosacral 10/06/2012   Chronic pain 10/24/2010    Social Hx   Social History   Socioeconomic History   Marital status: Divorced    Spouse name: Not on file   Number of children: Not on file   Years of education: Not on file   Highest education level: Associate degree: occupational, Scientist, product/process development, or vocational program  Occupational History   Occupation: Charity fundraiser at [employer]   Tobacco Use   Smoking status: Former    Current packs/day: 0.00    Average packs/day: 1 pack/day for 33.8 years (33.8 ttl pk-yrs)    Types: Cigarettes    Start date: 08/02/1972    Quit date: 05/27/2006    Years since quitting: 16.9   Smokeless tobacco: Never  Vaping  Use   Vaping status: Never Used  Substance and Sexual Activity   Alcohol use: Yes    Alcohol/week: 0.0 standard drinks of alcohol    Comment: occas.   Drug use: No   Sexual activity: Yes    Birth control/protection: Surgical  Other Topics Concern   Not on file  Social History Narrative   Not on file   Social Drivers of Health   Financial Resource Strain: Low Risk  (06/28/2022)   Overall Financial Resource Strain (CARDIA)    Difficulty of Paying Living Expenses: Not very hard  Food Insecurity: Food Insecurity Present (06/28/2022)   Hunger Vital Sign    Worried About Running Out of Food in the Last Year: Sometimes true    Ran Out of Food in the Last Year: Sometimes true  Transportation Needs: No Transportation Needs (06/28/2022)   PRAPARE - Administrator, Civil Service (Medical): No    Lack of Transportation (Non-Medical): No  Physical Activity: Inactive (06/28/2022)   Exercise Vital Sign    Days of Exercise per Week: 0 days    Minutes of Exercise per Session: 0 min  Stress: No Stress Concern Present (06/28/2022)   [employer] of Occupational Health - Occupational Stress Questionnaire    Feeling of Stress : Not at all  Social Connections: Moderately Integrated (06/28/2022)   Social Connection and Isolation Panel [NHANES]    Frequency of Communication with Friends and Family: More than three times  a week    Frequency of Social Gatherings with Friends and Family: Not on file    Attends Religious Services: 1 to 4 times per year    Active Member of Clubs or Organizations: Not on file    Attends Banker Meetings: 1 to 4 times per year    Marital Status: Divorced    Review of Systems Per HPI  Objective:  BP 117/70   Pulse 82   Temp 98.6 F (37 C)   Ht 5' (1.524 m)   Wt 233 lb (105.7 kg)   SpO2 96%   BMI 45.50 kg/m      05/16/2023    4:26 PM 07/22/2022    1:06 PM 06/28/2022    2:33 PM  BP/Weight  Systolic BP 117 121 --  Diastolic BP 70 72 --   Wt. (Lbs) 233 222 227  BMI 45.5 kg/m2 43.36 kg/m2 44.33 kg/m2    Physical Exam Vitals and nursing note reviewed.  Constitutional:      General: She is not in acute distress.    Appearance: Normal appearance.  HENT:     Head: Normocephalic and atraumatic.     Mouth/Throat:     Pharynx: Oropharynx is clear.  Cardiovascular:     Rate and Rhythm: Normal rate and regular rhythm.  Pulmonary:     Effort: Pulmonary effort is normal.     Breath sounds: Normal breath sounds. No wheezing or rales.  Neurological:     Mental Status: She is alert.  Psychiatric:        Mood and Affect: Mood normal.        Behavior: Behavior normal.     Lab Results  Component Value Date   WBC 9.4 04/27/2021   HGB 11.0 (L) 04/27/2021   HCT 34.1 04/27/2021   PLT 422 04/27/2021   GLUCOSE 88 03/28/2021   CHOL 162 03/05/2016   TRIG 126 03/05/2016   HDL 58 03/05/2016   LDLCALC 79 03/05/2016   ALT 8 03/28/2021   AST 12 03/28/2021   NA 139 03/28/2021   K 4.2 03/28/2021   CL 101 03/28/2021   CREATININE 0.57 03/28/2021   BUN 15 03/28/2021   CO2 29 03/28/2021   TSH 3.100 08/29/2020   INR 0.95 06/13/2011     Assessment & Plan:   Problem List Items Addressed This Visit       Other   Acute cough - Primary   Given comorbidity, chest x-ray was obtained was independently reviewed by me.  Interpretation: Normal chest x-ray.  No evidence filtrate. Promethazine DM for cough.      Relevant Orders   DG Chest 2 View (Completed)    Meds ordered this encounter  Medications   promethazine-dextromethorphan (PROMETHAZINE-DM) 6.25-15 MG/5ML syrup    Sig: Take 5 mLs by mouth 4 (four) times daily as needed for cough.    Dispense:  118 mL    Refill:  0    Follow-up:  Return if symptoms worsen or fail to improve.  Everlene Other DO [facility]

## 2023-05-19 DIAGNOSIS — R051 Acute cough: Secondary | ICD-10-CM | POA: Insufficient documentation

## 2023-05-19 NOTE — Assessment & Plan Note (Signed)
Given comorbidity, chest x-ray was obtained was independently reviewed by me.  Interpretation: Normal chest x-ray.  No evidence filtrate. Promethazine DM for cough.

## 2023-07-04 ENCOUNTER — Ambulatory Visit: Payer: PPO

## 2023-07-04 DIAGNOSIS — Z6841 Body Mass Index (BMI) 40.0 and over, adult: Secondary | ICD-10-CM | POA: Diagnosis not present

## 2023-07-04 DIAGNOSIS — M25552 Pain in left hip: Secondary | ICD-10-CM | POA: Diagnosis not present

## 2023-07-04 DIAGNOSIS — Z79899 Other long term (current) drug therapy: Secondary | ICD-10-CM | POA: Diagnosis not present

## 2023-07-04 DIAGNOSIS — G894 Chronic pain syndrome: Secondary | ICD-10-CM | POA: Diagnosis not present

## 2023-07-10 DIAGNOSIS — Z79899 Other long term (current) drug therapy: Secondary | ICD-10-CM | POA: Diagnosis not present

## 2023-07-26 ENCOUNTER — Other Ambulatory Visit: Payer: Self-pay | Admitting: Cardiology

## 2023-07-28 ENCOUNTER — Other Ambulatory Visit: Payer: Self-pay | Admitting: Cardiology

## 2023-08-08 ENCOUNTER — Ambulatory Visit: Payer: PPO

## 2023-08-13 ENCOUNTER — Other Ambulatory Visit: Payer: Self-pay | Admitting: Family Medicine

## 2023-08-22 ENCOUNTER — Other Ambulatory Visit (HOSPITAL_COMMUNITY): Payer: Self-pay | Admitting: Family Medicine

## 2023-08-22 DIAGNOSIS — Z1231 Encounter for screening mammogram for malignant neoplasm of breast: Secondary | ICD-10-CM

## 2023-08-28 ENCOUNTER — Encounter: Payer: Self-pay | Admitting: Family Medicine

## 2023-08-28 ENCOUNTER — Other Ambulatory Visit: Payer: Self-pay | Admitting: Family Medicine

## 2023-08-28 MED ORDER — PANTOPRAZOLE SODIUM 40 MG PO TBEC: DELAYED_RELEASE_TABLET | ORAL | 1 refills | Status: DC

## 2023-09-01 ENCOUNTER — Other Ambulatory Visit: Payer: Self-pay | Admitting: Family Medicine

## 2023-09-01 MED ORDER — PANTOPRAZOLE SODIUM 40 MG PO TBEC: DELAYED_RELEASE_TABLET | ORAL | 1 refills | Status: DC

## 2023-09-03 ENCOUNTER — Ambulatory Visit (HOSPITAL_COMMUNITY)
Admission: RE | Admit: 2023-09-03 | Discharge: 2023-09-03 | Disposition: A | Discharge status: Home / Self Care | Source: Ambulatory Visit | Attending: Family Medicine | Admitting: Family Medicine

## 2023-09-03 DIAGNOSIS — Z1231 Encounter for screening mammogram for malignant neoplasm of breast: Secondary | ICD-10-CM | POA: Diagnosis not present

## 2023-09-17 DIAGNOSIS — A498 Other bacterial infections of unspecified site: Secondary | ICD-10-CM | POA: Diagnosis not present

## 2023-09-17 DIAGNOSIS — T8452XS Infection and inflammatory reaction due to internal left hip prosthesis, sequela: Secondary | ICD-10-CM | POA: Diagnosis not present

## 2023-09-24 DIAGNOSIS — A498 Other bacterial infections of unspecified site: Secondary | ICD-10-CM | POA: Diagnosis not present

## 2023-09-24 DIAGNOSIS — T8452XS Infection and inflammatory reaction due to internal left hip prosthesis, sequela: Secondary | ICD-10-CM | POA: Diagnosis not present

## 2023-10-20 NOTE — Progress Notes (Unsigned)
  Cardiology Office Note:   Date:  10/22/2023  ID:  Sharon [name], DOB 1955/11/05, MRN [id number] PCP: Cook, Jayce G, DO  [facility] Providers Cardiologist:  Eilleen Grates, MD {  History of Present Illness:   Sharon [name] is a 68 y.o. female who was referred by Cook, Jayce G, DO for evaluation of palpitations.   Since I last saw her she is finally starting to recover from the hip surgery and has had a chronic infection.  She is chronically on Bactrim  DS now and still talks with the infectious disease doctor in [place].  She is now walking with a cane. The patient denies any new symptoms such as chest discomfort, neck or arm discomfort. There has been no new shortness of breath, PND or orthopnea. There have been no reported palpitations, presyncope or syncope.    ROS: As stated in the HPI and negative for all other systems.  Studies Reviewed:    EKG:   EKG Interpretation Date/Time:  Wednesday Oct 22 2023 11:21:05 EDT Ventricular Rate:  75 PR Interval:  218 QRS Duration:  80 QT Interval:  374 QTC Calculation: 417 R Axis:   14  Text Interpretation: Sinus rhythm with 1st degree A-V block When compared with ECG of 03-Jan-2021 16:56, Premature ventricular complexes are no longer No significant change since last tracing Confirmed by Eilleen Grates ([id number]) on 10/22/2023 11:35:37 AM     Risk Assessment/Calculations:              Physical Exam:   VS:  BP 126/76 (BP Location: Left Arm, Patient Position: Sitting)   Pulse 75   Ht 5' (1.524 m)   Wt 237 lb 9.6 oz (107.8 kg)   SpO2 97%   BMI 46.40 kg/m    Wt Readings from Last 3 Encounters:  10/22/23 237 lb 9.6 oz (107.8 kg)  05/16/23 233 lb (105.7 kg)  07/22/22 222 lb (100.7 kg)     GEN: Well nourished, well developed in no acute distress NECK: No JVD; No carotid bruits CARDIAC: RRR, no murmurs, rubs, gallops RESPIRATORY:  Clear to auscultation without rales, wheezing or rhonchi  ABDOMEN: Soft, non-tender,  non-distended EXTREMITIES:  No edema; No deformity   ASSESSMENT AND PLAN:   Palpitations: These are not typically symptomatic.  She remains on the metoprolol  25 mg twice daily.  She is up-to-date with blood work although I do not have the most recent labs.  She says her chemistry is unremarkable.  No change in therapy.  HTN: Her blood pressure is at target.  No change in therapy.    Follow up in 18 months or as needed.   Signed, Eilleen Grates, MD

## 2023-10-22 ENCOUNTER — Encounter: Payer: Self-pay | Admitting: Cardiology

## 2023-10-22 ENCOUNTER — Ambulatory Visit: Attending: Cardiology | Admitting: Cardiology

## 2023-10-22 VITALS — BP 126/76 | HR 75 | Ht 60.0 in | Wt 237.6 lb

## 2023-10-22 DIAGNOSIS — I10 Essential (primary) hypertension: Secondary | ICD-10-CM

## 2023-10-22 DIAGNOSIS — R002 Palpitations: Secondary | ICD-10-CM

## 2023-10-22 NOTE — Patient Instructions (Signed)
 Medication Instructions:  Your physician recommends that you continue on your current medications as directed. Please refer to the Current Medication list given to you today.  *If you need a refill on your cardiac medications before your next appointment, please call your pharmacy*  Follow-Up: At [facility], you and your health needs are our priority.  As part of our continuing mission to provide you with exceptional heart care, our providers are all part of one team.  This team includes your primary Cardiologist (physician) and Advanced Practice Providers or APPs (Physician Assistants and Nurse Practitioners) who all work together to provide you with the care you need, when you need it.  Your next appointment:   18 month(s)  Provider:   Eilleen Grates, MD    We recommend signing up for the patient portal called "MyChart".  Sign up information is provided on this After Visit Summary.  MyChart is used to connect with patients for Virtual Visits (Telemedicine).  Patients are able to view lab/test results, encounter notes, upcoming appointments, etc.  Non-urgent messages can be sent to your provider as well.   To learn more about what you can do with MyChart, go to [email or web address].

## 2023-10-23 ENCOUNTER — Encounter: Payer: Self-pay | Admitting: Cardiology

## 2023-11-14 ENCOUNTER — Ambulatory Visit

## 2023-11-14 VITALS — Ht 60.0 in | Wt 237.0 lb

## 2023-11-14 DIAGNOSIS — Z0000 Encounter for general adult medical examination without abnormal findings: Secondary | ICD-10-CM

## 2023-11-14 NOTE — Progress Notes (Signed)
 Subjective:   Sharon [name] is a 68 y.o. who presents for a Medicare Wellness preventive visit.  As a reminder, Annual Wellness Visits don't include a physical exam, and some assessments may be limited, especially if this visit is performed virtually. We may recommend an in-person follow-up visit with your provider if needed.  Visit Complete: Virtual I connected with  Sharon [name] on 11/14/23 by a audio enabled telemedicine application and verified that I am speaking with the correct person using two identifiers.  Patient Location: Home  Provider Location: Home Office  I discussed the limitations of evaluation and management by telemedicine. The patient expressed understanding and agreed to proceed.  Vital Signs: Because this visit was a virtual/telehealth visit, some criteria may be missing or patient reported. Any vitals not documented were not able to be obtained and vitals that have been documented are patient reported.  VideoDeclined- This patient declined Librarian, academic. Therefore the visit was completed with audio only.  Persons Participating in Visit: Patient.  AWV Questionnaire: Yes: Patient Medicare AWV questionnaire was completed by the patient on 11/10/23; I have confirmed that all information answered by patient is correct and no changes since this date.  Cardiac Risk Factors include: advanced age (>97men, >3 women);hypertension;obesity (BMI >30kg/m2);sedentary lifestyle     Objective:    Today's Vitals   11/14/23 1008  Weight: 237 lb (107.5 kg)  Height: 5' (1.524 m)   Body mass index is 46.29 kg/m.     11/14/2023   10:11 AM 06/28/2022    2:36 PM 06/12/2021    2:38 PM 05/01/2021    3:21 PM 01/02/2021    2:23 PM 04/14/2020   11:19 AM 04/14/2020    6:17 AM  Advanced Directives  Does Patient Have a Medical Advance Directive? No No Yes Yes Yes Yes Yes  Type of Surveyor, minerals;Living will  Healthcare Power of [place];Living will Healthcare Power of [place];Living will Healthcare Power of [place];Living will Healthcare Power of [place];Living will  Does patient want to make changes to medical advance directive?    No - Patient declined No - Patient declined No - Patient declined   Copy of Healthcare Power of Attorney in Chart?   No - copy requested No - copy requested No - copy requested No - copy requested   Would patient like information on creating a medical advance directive? Yes (MAU/Ambulatory/Procedural Areas - Information given) No - Patient declined         Current Medications (verified) Outpatient Encounter Medications as of 11/14/2023  Medication Sig   albuterol  (PROVENTIL ) (2.5 MG/3ML) 0.083% nebulizer solution USE ONE VIAL IN NEBULIZER EVERY 4 HOURS AS NEEDED FOR WHEEZING   albuterol  (VENTOLIN  HFA) 108 (90 Base) MCG/ACT inhaler Inhale 1-2 puffs into the lungs every 6 (six) hours as needed for wheezing or shortness of breath.   Calcium -Vitamin D -Vitamin K (VIACTIV CALCIUM  PLUS D) 650-12.5-40 MG-MCG-MCG CHEW Chew 1 tablet by mouth in the morning, at noon, and at bedtime.   CELEBREX  100 MG capsule    fexofenadine (ALLEGRA) 180 MG tablet Take 180 mg by mouth in the morning.   fluticasone  (FLONASE ) 50 MCG/ACT nasal spray Place 2 sprays into both nostrils at bedtime.   furosemide  (LASIX ) 20 MG tablet TAKE 1 TABLET(20 MG) BY MOUTH DAILY   metoprolol  tartrate (LOPRESSOR ) 25 MG tablet TAKE 1 TABLET(25 MG) BY MOUTH TWICE DAILY (Patient taking differently: Take 25 mg by mouth.)   Multiple Vitamins-Minerals (  BARIATRIC MULTIVITAMINS/IRON PO) Take 1 tablet by mouth in the morning.   potassium chloride  (KLOR-CON  M) 10 MEQ tablet TAKE 2 TABLETS(20 MEQ) BY MOUTH DAILY   sulfamethoxazole -trimethoprim  (BACTRIM  DS) 800-160 MG tablet Take 1 tablet by mouth 2 (two) times daily.   aspirin  81 MG chewable tablet 2 (two) times daily.   pantoprazole  (PROTONIX ) 40 MG tablet TAKE 1 TABLET BY  MOUTH EVERY DAY FOR REFLUX   potassium chloride  (KLOR-CON ) 10 MEQ tablet TAKE 2 TABLETS BY MOUTH DAILY   No facility-administered encounter medications on file as of 11/14/2023.    Allergies (verified) Other, Adhesive [tape], Latex, Nickel, Vancomycin , Augmentin [amoxicillin-pot clavulanate], and Neomycin-bacitracin zn-polymyx   History: Past Medical History:  Diagnosis Date   Allergy     Anemia Nov 2022   Anginal pain (HCC)    in February 2022   Biliary colic    Cancer (HCC)    melanoma: lt. inner thight   Carpal tunnel syndrome    bilateral   Chronic allergic rhinitis    Chronic urticaria    Complication of anesthesia    confusion for a couple of days after waking after having multiple knee surgery's, does well with spinal   Degenerative disk disease    Diverticulosis of colon    Dyspnea    Dysrhythmia    February 2022   Fatty liver    Fibrocystic breast disease 1996   GERD (gastroesophageal reflux disease)    History of colon polyps    HYPERPLASTIC 09-14-2015   History of kidney stones    passed on own   History of melanoma excision    left thigh ,inside 2009--- per pt also has had severeal excision of office for almost melanoma   Hypertension    Migraine    since age 27 not as frequent now   Moderate persistent asthma, uncomplicated    followed by dr gallagher (cone allergy  and asthma)   OA (osteoarthritis)    all joints   SUI (stress urinary incontinence, female)    Past Surgical History:  Procedure Laterality Date   ANTERIOR HIP REVISION Left 01/02/2021   Procedure: IRRIGATION AND DEBRIDEMENT LEFT HIP WITH HIP BALL AND POLY ETHYLENE LINER EXCHANGE.;  Surgeon: Arnie Lao, MD;  Location: [facility];  Service: Orthopedics;  Laterality: Left;  needs RNFA   APPENDECTOMY  1973   APPLICATION OF WOUND VAC Left 01/02/2021   Procedure: APPLICATION OF WOUND VAC;  Surgeon: Arnie Lao, MD;  Location: [facility] OR;  Service: Orthopedics;  Laterality: Left;    BREAST EXCISIONAL BIOPSY Right 1994;  10-01-2009   2011 per path mild ductal hyperplasia/ fibrocystic breast   BREAST SURGERY     CHOLECYSTECTOMY N/A 01/30/2017   Procedure: LAPAROSCOPIC CHOLECYSTECTOMY;  Surgeon: Dorrie Gaudier Alphonso Aschoff, MD;  Location: [facility];  Service: General;  Laterality: N/A;   COLONOSCOPY N/A 09/14/2015   Procedure: COLONOSCOPY;  Surgeon: Suzette Espy, MD;  Location: [facility];  Service: Endoscopy;  Laterality: N/A;  930 - moved to 9:15 - office to notify   ESOPHAGOGASTRODUODENOSCOPY (EGD) WITH PROPOFOL  N/A 11/29/2016   Procedure: ESOPHAGOGASTRODUODENOSCOPY (EGD) WITH PROPOFOL ;  Surgeon: Albertina Hugger, MD;  Location: [facility];  Service: Gastroenterology;  Laterality: N/A;   JOINT REPLACEMENT     knee replacement and left hip replacement   KNEE ARTHROSCOPY W/ MENISCECTOMY Bilateral left 12-19-2006;  right 01-26-2010   LAPAROSCOPIC GASTRIC SLEEVE RESECTION N/A 08/16/2019   Procedure: LAPAROSCOPIC GASTRIC SLEEVE RESECTION, Upper Endo, ERAS Pathway;  Surgeon: Dorrie Gaudier, Alphonso Aschoff, MD;  Location: [facility];  Service: General;  Laterality: N/A;   TONSILLECTOMY  1959   TOTAL HIP ARTHROPLASTY Left 04/14/2020   Procedure: LEFT TOTAL HIP ARTHROPLASTY ANTERIOR APPROACH;  Surgeon: Arnie Lao, MD;  Location: [facility];  Service: Orthopedics;  Laterality: Left;   TOTAL KNEE ARTHROPLASTY Left 03/10/2007   TOTAL KNEE ARTHROPLASTY  06/17/2011   Procedure: TOTAL KNEE ARTHROPLASTY;  Surgeon: Elsa Halls, MD;  Location: [facility];  Service: Orthopedics;  Laterality: Right;  [place]   TOTAL KNEE REVISION Left 07/08/2010  (spinal anesthesia)   TUBAL LIGATION Bilateral YRS AGO   VAGINAL HYSTERECTOMY  1988   Family History  Problem Relation Age of Onset   Hypertension Father    Lung cancer Father 75       lung cancer   Arthritis Mother    Cancer Mother    Lung disease Other        family history    Allergic rhinitis Daughter    Food Allergy   Grandchild    Anesthesia problems Neg Hx    Hypotension Neg Hx    Malignant hyperthermia Neg Hx    Pseudochol deficiency Neg Hx    Colon cancer Neg Hx    Angioedema Neg Hx    Asthma Neg Hx    Eczema Neg Hx    Immunodeficiency Neg Hx    Urticaria Neg Hx    Social History   Socioeconomic History   Marital status: Divorced    Spouse name: Not on file   Number of children: Not on file   Years of education: Not on file   Highest education level: Associate degree: academic program  Occupational History   Occupation: Charity fundraiser at [employer]   Tobacco Use   Smoking status: Former    Current packs/day: 0.00    Average packs/day: 1 pack/day for 33.8 years (33.8 ttl pk-yrs)    Types: Cigarettes    Start date: 08/02/1972    Quit date: 05/27/2006    Years since quitting: 17.4   Smokeless tobacco: Never   Tobacco comments:    No longer smoking since 2008  Vaping Use   Vaping status: Never Used  Substance and Sexual Activity   Alcohol use: Not Currently    Comment: occas.   Drug use: No   Sexual activity: Not Currently    Birth control/protection: Surgical  Other Topics Concern   Not on file  Social History Narrative   Not on file   Social Drivers of Health   Financial Resource Strain: Medium Risk (11/10/2023)   Overall Financial Resource Strain (CARDIA)    Difficulty of Paying Living Expenses: Somewhat hard  Food Insecurity: No Food Insecurity (11/10/2023)   Hunger Vital Sign    Worried About Running Out of Food in the Last Year: Never true    Ran Out of Food in the Last Year: Never true  Transportation Needs: No Transportation Needs (11/10/2023)   PRAPARE - Administrator, Civil Service (Medical): No    Lack of Transportation (Non-Medical): No  Physical Activity: Inactive (11/10/2023)   Exercise Vital Sign    Days of Exercise per Week: 0 days    Minutes of Exercise per Session: 0 min  Stress: No Stress Concern Present (11/10/2023)   [employer] of Occupational Health -  Occupational Stress Questionnaire    Feeling of Stress: Not at all  Social Connections: Moderately Integrated (11/10/2023)   Social Connection and Isolation Panel  Frequency of Communication with Friends and Family: More than three times a week    Frequency of Social Gatherings with Friends and Family: Three times a week    Attends Religious Services: More than 4 times per year    Active Member of Clubs or Organizations: Yes    Attends Banker Meetings: More than 4 times per year    Marital Status: Divorced    Tobacco Counseling Counseling given: Not Answered Tobacco comments: No longer smoking since 2008    Clinical Intake:  Pre-visit preparation completed: Yes  Pain : No/denies pain     Diabetes: No  No results found for: HGBA1C   How often do you need to have someone help you when you read instructions, pamphlets, or other written materials from your doctor or pharmacy?: 1 - Never  Interpreter Needed?: No  Information entered by :: Seabron Cypress LPN   Activities of Daily Living     11/10/2023    7:31 PM  In your present state of health, do you have any difficulty performing the following activities:  Hearing? 1  Vision? 1  Difficulty concentrating or making decisions? 0  Walking or climbing stairs? 1  Dressing or bathing? 0  Doing errands, shopping? 0  Preparing Food and eating ? N  Using the Toilet? N  In the past six months, have you accidently leaked urine? Y  Do you have problems with loss of bowel control? N  Managing your Medications? N  Managing your Finances? N  Housekeeping or managing your Housekeeping? Y    Patient Care Team: Cook, Jayce G, DO as PCP - General (Family Medicine) Eilleen Grates, MD as PCP - Cardiology (Cardiology) Paulett Boros, MD as Medical Oncologist (Hematology) Springer, Elodia Hailstone, MD as Referring Physician (Orthopedic Surgery) Gerard Knight, MD (Infectious Diseases) [facility] Register, Amber, PA-C as Physician Assistant (Dermatology)  I have updated your Care Teams any recent Medical Services you may have received from other providers in the past year.     Assessment:   This is a routine wellness examination for [place].  Hearing/Vision screen Hearing Screening - Comments:: Denies hearing difficulties   Vision Screening - Comments:: Wears rx glasses - up to date with routine eye exams with [facility]. Alyse July    Goals Addressed             This Visit's Progress    Improve Mobility   On track      Depression Screen     11/14/2023   10:10 AM 05/16/2023    4:27 PM 06/28/2022    2:39 PM 06/12/2021    2:34 PM 04/27/2021    2:20 PM 09/01/2020    8:52 AM 08/18/2020    9:31 AM  PHQ 2/9 Scores  PHQ - 2 Score 0 0 0 2 0 0 0  PHQ- 9 Score  1  4       Fall Risk     11/10/2023    7:31 PM 06/28/2022    2:35 PM 06/25/2022    2:31 PM 01/18/2022    1:22 PM 06/12/2021    2:39 PM  Fall Risk   Falls in the past year? 0 0 0  1  Number falls in past yr: 0 0 0 0 1  Injury with Fall? 0 0 0  1  Risk for fall due to : Impaired mobility;Impaired balance/gait;Orthopedic patient   Impaired balance/gait;Impaired mobility Impaired balance/gait;Impaired mobility;History of fall(s)  Follow  up Falls prevention discussed;Education provided;Falls evaluation completed Falls prevention discussed;Education provided;Falls evaluation completed  Falls evaluation completed       Data saved with a previous flowsheet row definition    MEDICARE RISK AT HOME:  Medicare Risk at Home Any stairs in or around the home?: (Patient-Rptd) Yes If so, are there any without handrails?: (Patient-Rptd) No Home free of loose throw rugs in walkways, pet beds, electrical cords, etc?: (Patient-Rptd) Yes Adequate lighting in your home to reduce risk of falls?: (Patient-Rptd) Yes Life alert?: (Patient-Rptd) No Use of a cane, walker or w/c?: (Patient-Rptd) Yes Grab bars in the bathroom?:  (Patient-Rptd) Yes Shower chair or bench in shower?: (Patient-Rptd) Yes Elevated toilet seat or a handicapped toilet?: (Patient-Rptd) Yes  TIMED UP AND GO:  Was the test performed?  No  Cognitive Function: Declined/Normal: No cognitive concerns noted by patient or family. Patient alert, oriented, able to answer questions appropriately and recall recent events. No signs of memory loss or confusion.        11/14/2023   10:11 AM 06/28/2022    2:37 PM  6CIT Screen  What Year? 0 points 0 points  What month? 0 points 0 points  What time? 0 points 0 points  Count back from 20 0 points 0 points  Months in reverse 0 points 0 points  Repeat phrase 0 points 0 points  Total Score 0 points 0 points    Immunizations Immunization History  Administered Date(s) Administered   Influenza,inj,Quad PF,6+ Mos 03/04/2016, 03/19/2017, 02/04/2018   Influenza-Unspecified 04/27/2011, 03/08/2014, 03/27/2015, 03/27/2015, 03/19/2017, 03/19/2017, 02/12/2019   Pneumococcal Polysaccharide-23 02/24/2010   Td 09/17/2013   Tdap 02/24/2012   Zoster Recombinant(Shingrix) 03/19/2017, 02/12/2019    Screening Tests Health Maintenance  Topic Date Due   Hepatitis C Screening  Never done   Pneumococcal Vaccine: 50+ Years (2 of 2 - PCV) 02/25/2011   DTaP/Tdap/Td (3 - Td or Tdap) 09/18/2023   INFLUENZA VACCINE  12/26/2023   Medicare Annual Wellness (AWV)  11/13/2024   MAMMOGRAM  09/02/2025   Colonoscopy  09/13/2025   DEXA SCAN  Completed   Zoster Vaccines- Shingrix  Completed   HPV VACCINES  Aged Out   Meningococcal B Vaccine  Aged Out   COVID-19 Vaccine  Discontinued    Health Maintenance  Health Maintenance Due  Topic Date Due   Hepatitis C Screening  Never done   Pneumococcal Vaccine: 50+ Years (2 of 2 - PCV) 02/25/2011   DTaP/Tdap/Td (3 - Td or Tdap) 09/18/2023   Health Maintenance Items Addressed: Will consider dexa in the future   Additional Screening:  Vision Screening: Recommended annual  ophthalmology exams for early detection of glaucoma and other disorders of the eye. Would you like a referral to an eye doctor? No    Dental Screening: Recommended annual dental exams for proper oral hygiene  Community Resource Referral / Chronic Care Management: CRR required this visit?  No   CCM required this visit?  No   Plan:    I have personally reviewed and noted the following in the patient's chart:   Medical and social history Use of alcohol, tobacco or illicit drugs  Current medications and supplements including opioid prescriptions. Patient is not currently taking opioid prescriptions. Functional ability and status Nutritional status Physical activity Advanced directives List of other physicians Hospitalizations, surgeries, and ER visits in previous 12 months Vitals Screenings to include cognitive, depression, and falls Referrals and appointments  In addition, I have reviewed and discussed with patient certain  preventive protocols, quality metrics, and best practice recommendations. A written personalized care plan for preventive services as well as general preventive health recommendations were provided to patient.   Seabron Cypress [place], [place]   09/02/8117   After Visit Summary: (MyChart) Due to this being a telephonic visit, the after visit summary with patients personalized plan was offered to patient via MyChart   Notes: Nothing significant to report at this time.

## 2023-11-14 NOTE — Patient Instructions (Addendum)
 Sharon [name] , Thank you for taking time out of your busy schedule to complete your Annual Wellness Visit with me. I enjoyed our conversation and look forward to speaking with you again next year. I, as well as your care team,  appreciate your ongoing commitment to your health goals. Please review the following plan we discussed and let me know if I can assist you in the future. Your Game plan/ To Do List    Follow up Visits: Next Medicare AWV with our clinical staff: In 1 year    Have you seen your provider in the last 6 months (3 months if uncontrolled diabetes)? Yes Next Office Visit with your provider: 11/17/23 @ 10:40  Clinician Recommendations:  Aim for 30 minutes of exercise or brisk walking, 6-8 glasses of water , and 5 servings of fruits and vegetables each day.      This is a list of the screening recommended for you and due dates:  Health Maintenance  Topic Date Due   Hepatitis C Screening  Never done   Pneumococcal Vaccine for age over 22 (2 of 2 - PCV) 02/25/2011   DTaP/Tdap/Td vaccine (3 - Td or Tdap) 09/18/2023   Flu Shot  12/26/2023   Medicare Annual Wellness Visit  11/13/2024   Mammogram  09/02/2025   Colon Cancer Screening  09/13/2025   DEXA scan (bone density measurement)  Completed   Zoster (Shingles) Vaccine  Completed   HPV Vaccine  Aged Out   Meningitis B Vaccine  Aged Out   COVID-19 Vaccine  Discontinued    Advanced directives: (ACP Link)Information on Advanced Care Planning can be found at [place]  Secretary of [facility] Advance Health Care Directives Advance Health Care Directives. [email or web address]   Advance Care Planning is important because it:  [x]  Makes sure you receive the medical care that is consistent with your values, goals, and preferences  [x]  It provides guidance to your family and loved ones and reduces their decisional burden about whether or not they are making the right decisions based on your wishes.  Follow the link provided in your after  visit summary or read over the paperwork we have mailed to you to help you started getting your Advance Directives in place. If you need assistance in completing these, please reach out to us  so that we can help you!  See attachments for Preventive Care and Fall Prevention Tips.

## 2023-11-17 ENCOUNTER — Other Ambulatory Visit (HOSPITAL_COMMUNITY)
Admission: RE | Admit: 2023-11-17 | Discharge: 2023-11-17 | Disposition: A | Discharge status: Home / Self Care | Source: Ambulatory Visit | Attending: Family Medicine | Admitting: Family Medicine

## 2023-11-17 ENCOUNTER — Ambulatory Visit (INDEPENDENT_AMBULATORY_CARE_PROVIDER_SITE_OTHER): Admitting: Family Medicine

## 2023-11-17 ENCOUNTER — Encounter: Payer: Self-pay | Admitting: Family Medicine

## 2023-11-17 VITALS — BP 136/72 | HR 78 | Temp 98.6°F | Ht 60.0 in | Wt 238.0 lb

## 2023-11-17 DIAGNOSIS — M25452 Effusion, left hip: Secondary | ICD-10-CM | POA: Insufficient documentation

## 2023-11-17 DIAGNOSIS — I10 Essential (primary) hypertension: Secondary | ICD-10-CM | POA: Diagnosis not present

## 2023-11-17 DIAGNOSIS — N898 Other specified noninflammatory disorders of vagina: Secondary | ICD-10-CM | POA: Diagnosis not present

## 2023-11-17 DIAGNOSIS — D509 Iron deficiency anemia, unspecified: Secondary | ICD-10-CM

## 2023-11-17 DIAGNOSIS — Z13220 Encounter for screening for lipoid disorders: Secondary | ICD-10-CM

## 2023-11-17 DIAGNOSIS — R7309 Other abnormal glucose: Secondary | ICD-10-CM

## 2023-11-17 DIAGNOSIS — N952 Postmenopausal atrophic vaginitis: Secondary | ICD-10-CM | POA: Diagnosis not present

## 2023-11-17 MED ORDER — ESTRADIOL 0.1 MG/GM VA CREA: 1.0000 | TOPICAL_CREAM | Freq: Every day | VAGINAL | 12 refills | Status: AC

## 2023-11-17 NOTE — Assessment & Plan Note (Signed)
 Awaiting results of swab.

## 2023-11-17 NOTE — Assessment & Plan Note (Signed)
 Given complicated history, arranging for ultrasound to ensure no underlying fluid collection which could suggest infection.

## 2023-11-17 NOTE — Assessment & Plan Note (Signed)
 Stable. Continue Lasix.

## 2023-11-17 NOTE — Progress Notes (Signed)
 Subjective:  Patient ID: Sharon [name], female    DOB: 05/09/1956  Age: 68 y.o. MRN: [id number]  CC:   Chief Complaint  Patient presents with   Follow-up    6 month f/u hypertension, obesity    Bleeding/Bruising   vaginal dryness    Vaginal dryness and odor  Concern for daily antibiotic usage causing symptoms    HPI:  68 year old female presents for follow-up.  Patient states that overall she is doing exceptionally well.  She is off pain medication and is on Celebrex .  Patient is concerned that she has some swelling near her prior incision sites (complicated history regarding left hip).  She states that she is concerned that there may be infection developing.  She would like me to examine this today.  Patient also reports that she is having vaginal dryness.  She is also had some vaginal odor.  She is concerned that this may be related to chronic antibiotic use (due to Proteus left hip arthroplasty infection).   Hypertension is well-controlled.  Patient now off Protonix .  Lastly, patient reports easy bruising.  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, subsequent encounter 01/02/2021   Seasonal and perennial allergic rhinitis 03/06/2017   Moderate persistent asthma, uncomplicated 12/02/2016   Chronic urticaria 12/02/2016   Anaphylactic shock due to adverse food reaction 12/02/2016   Gastroesophageal reflux disease 08/08/2015   Status post right knee replacement 06/17/2011 06/17/2013   Status post left knee replacement 02/28/2007 06/17/2013   DDD (degenerative disc disease), lumbosacral 10/06/2012    Social Hx   Social History   Socioeconomic History   Marital status: Divorced     Spouse name: Not on file   Number of children: Not on file   Years of education: Not on file   Highest education level: Associate degree: academic program  Occupational History   Occupation: Charity fundraiser at [employer]   Tobacco Use   Smoking status: Former    Current packs/day: 0.00    Average packs/day: 1 pack/day for 33.8 years (33.8 ttl pk-yrs)    Types: Cigarettes    Start date: 08/02/1972    Quit date: 05/27/2006    Years since quitting: 17.4   Smokeless tobacco: Never   Tobacco comments:    No longer smoking since 2008  Vaping Use   Vaping status: Never Used  Substance and Sexual Activity   Alcohol use: Not Currently    Comment: occas.   Drug use: No   Sexual activity: Not Currently    Birth control/protection: Surgical  Other Topics Concern   Not on file  Social History Narrative   Not on file   Social Drivers of Health   Financial Resource Strain: Medium Risk (11/10/2023)   Overall Financial Resource Strain (CARDIA)    Difficulty of Paying Living Expenses: Somewhat hard  Food Insecurity: No Food Insecurity (11/10/2023)   Hunger Vital Sign    Worried About Running Out of Food in the Last Year: Never true    Ran Out of Food in the Last Year: Never true  Transportation Needs: No Transportation Needs (11/10/2023)   PRAPARE - Administrator, Civil Service (Medical): No    Lack of Transportation (Non-Medical):  No  Physical Activity: Inactive (11/10/2023)   Exercise Vital Sign    Days of Exercise per Week: 0 days    Minutes of Exercise per Session: 0 min  Stress: No Stress Concern Present (11/10/2023)   [employer] of Occupational Health - Occupational Stress Questionnaire    Feeling of Stress: Not at all  Social Connections: Moderately Integrated (11/10/2023)   Social Connection and Isolation Panel    Frequency of Communication with Friends and Family: More than three times a week    Frequency of Social Gatherings with Friends and Family: Three times a week     Attends Religious Services: More than 4 times per year    Active Member of Clubs or Organizations: Yes    Attends Banker Meetings: More than 4 times per year    Marital Status: Divorced    Review of Systems Per HPI  Objective:  BP 136/72   Pulse 78   Temp 98.6 F (37 C)   Ht 5' (1.524 m)   Wt 238 lb (108 kg)   SpO2 99%   BMI 46.48 kg/m      11/17/2023   10:40 AM 11/14/2023   10:08 AM 10/22/2023   11:15 AM  BP/Weight  Systolic BP 136 -- 126  Diastolic BP 72 -- 76  Wt. (Lbs) 238 237 237.6  BMI 46.48 kg/m2 46.29 kg/m2 46.4 kg/m2    Physical Exam Vitals and nursing note reviewed. Exam conducted with a chaperone present.  Constitutional:      General: She is not in acute distress.    Appearance: Normal appearance. She is obese.  HENT:     Head: Normocephalic and atraumatic.   Eyes:     General:        Right eye: No discharge.        Left eye: No discharge.     Conjunctiva/sclera: Conjunctivae normal.    Cardiovascular:     Rate and Rhythm: Normal rate and regular rhythm.  Pulmonary:     Effort: Pulmonary effort is normal.     Breath sounds: Normal breath sounds. No wheezing or rales.  Genitourinary:    Comments: Mild atrophy noted.  No vaginal discharge. Swab sent for testing.  Musculoskeletal:       Legs:     Comments: Small area of erythema noted to the lateral proximal thigh.  No significant warmth.  Slightly tender.   Neurological:     Mental Status: She is alert.     Lab Results  Component Value Date   WBC 9.4 04/27/2021   HGB 11.0 (L) 04/27/2021   HCT 34.1 04/27/2021   PLT 422 04/27/2021   GLUCOSE 88 03/28/2021   CHOL 162 03/05/2016   TRIG 126 03/05/2016   HDL 58 03/05/2016   LDLCALC 79 03/05/2016   ALT 8 03/28/2021   AST 12 03/28/2021   NA 139 03/28/2021   K 4.2 03/28/2021   CL 101 03/28/2021   CREATININE 0.57 03/28/2021   BUN 15 03/28/2021   CO2 29 03/28/2021   TSH 3.100 08/29/2020   INR 0.95 06/13/2011      Assessment & Plan:  Essential hypertension Assessment & Plan: Stable.  Continue Lasix .  Orders: -     CMP14+EGFR  Vaginal odor Assessment & Plan: Awaiting results of swab.  Orders: -     Cervicovaginal ancillary only  Iron deficiency anemia, unspecified iron deficiency anemia type -     CBC -     Iron, TIBC and  Ferritin Panel  Elevated glucose -     Hemoglobin A1c  Screening for lipid disorders -     Lipid panel  Swelling of joint of pelvic region or thigh, left Assessment & Plan: Given complicated history, arranging for ultrasound to ensure no underlying fluid collection which could suggest infection.  Orders: -     US  LT LOWER EXTREM LTD SOFT TISSUE NON VASCULAR  Atrophic vaginitis Assessment & Plan: Treating with Estrace.  Orders: -     Estradiol; Place 1 Applicatorful vaginally at bedtime. For 2 weeks. Then 3 times per week.  Dispense: 42.5 g; Refill: 12    Follow-up: 6 months  Sondra Blixt Bluford DO [facility]

## 2023-11-17 NOTE — Assessment & Plan Note (Signed)
 Treating with Estrace.

## 2023-11-17 NOTE — Patient Instructions (Signed)
 Stop aspirin .  Arranging ultrasound.  Medication as prescribed.  Follow up in 6 months.

## 2023-11-18 ENCOUNTER — Ambulatory Visit (HOSPITAL_COMMUNITY)
Admission: RE | Admit: 2023-11-18 | Discharge: 2023-11-18 | Disposition: A | Discharge status: Home / Self Care | Source: Ambulatory Visit | Attending: Family Medicine | Admitting: Family Medicine

## 2023-11-18 DIAGNOSIS — M25452 Effusion, left hip: Secondary | ICD-10-CM | POA: Diagnosis not present

## 2023-11-18 DIAGNOSIS — M7989 Other specified soft tissue disorders: Secondary | ICD-10-CM | POA: Diagnosis not present

## 2023-11-18 DIAGNOSIS — M79605 Pain in left leg: Secondary | ICD-10-CM | POA: Diagnosis not present

## 2023-11-18 LAB — CBC
Hematocrit: 43.7 % (ref 34.0–46.6)
Hemoglobin: 14.2 g/dL (ref 11.1–15.9)
MCH: 32.7 pg (ref 26.6–33.0)
MCHC: 32.5 g/dL (ref 31.5–35.7)
MCV: 101 fL — ABNORMAL HIGH (ref 79–97)
Platelets: 266 10*3/uL (ref 150–450)
RBC: 4.34 x10E6/uL (ref 3.77–5.28)
RDW: 12.7 % (ref 11.7–15.4)
WBC: 7.9 10*3/uL (ref 3.4–10.8)

## 2023-11-18 LAB — HEMOGLOBIN A1C
Est. average glucose Bld gHb Est-mCnc: 100 mg/dL
Hgb A1c MFr Bld: 5.1 % (ref 4.8–5.6)

## 2023-11-18 LAB — LIPID PANEL
Chol/HDL Ratio: 2.2 ratio (ref 0.0–4.4)
Cholesterol, Total: 192 mg/dL (ref 100–199)
HDL: 89 mg/dL (ref >39)
LDL Chol Calc (NIH): 90 mg/dL (ref 0–99)
Triglycerides: 74 mg/dL (ref 0–149)
VLDL Cholesterol Cal: 13 mg/dL (ref 5–40)

## 2023-11-18 LAB — IRON,TIBC AND FERRITIN PANEL
Ferritin: 108 ng/mL (ref 15–150)
Iron Saturation: 26 % (ref 15–55)
Iron: 83 ug/dL (ref 27–139)
Total Iron Binding Capacity: 324 ug/dL (ref 250–450)
UIBC: 241 ug/dL (ref 118–369)

## 2023-11-18 LAB — CMP14+EGFR
ALT: 13 IU/L (ref 0–32)
AST: 23 IU/L (ref 0–40)
Albumin: 4.4 g/dL (ref 3.9–4.9)
Alkaline Phosphatase: 93 IU/L (ref 44–121)
BUN/Creatinine Ratio: 25 (ref 12–28)
BUN: 24 mg/dL (ref 8–27)
Bilirubin Total: 0.3 mg/dL (ref 0.0–1.2)
CO2: 24 mmol/L (ref 20–29)
Calcium: 9.6 mg/dL (ref 8.7–10.3)
Chloride: 102 mmol/L (ref 96–106)
Creatinine, Ser: 0.97 mg/dL (ref 0.57–1.00)
Globulin, Total: 3 g/dL (ref 1.5–4.5)
Glucose: 91 mg/dL (ref 70–99)
Potassium: 4.6 mmol/L (ref 3.5–5.2)
Sodium: 140 mmol/L (ref 134–144)
Total Protein: 7.4 g/dL (ref 6.0–8.5)
eGFR: 63 mL/min/{1.73_m2} (ref >59)

## 2023-11-18 LAB — CERVICOVAGINAL ANCILLARY ONLY
Bacterial Vaginitis (gardnerella): NEGATIVE
Candida Glabrata: NEGATIVE
Candida Vaginitis: NEGATIVE
Comment: NEGATIVE
Comment: NEGATIVE
Comment: NEGATIVE

## 2023-11-19 ENCOUNTER — Ambulatory Visit: Payer: Self-pay | Admitting: Family Medicine

## 2023-11-19 DIAGNOSIS — Z85820 Personal history of malignant melanoma of skin: Secondary | ICD-10-CM | POA: Diagnosis not present

## 2023-11-19 DIAGNOSIS — D485 Neoplasm of uncertain behavior of skin: Secondary | ICD-10-CM | POA: Diagnosis not present

## 2023-11-19 DIAGNOSIS — Z08 Encounter for follow-up examination after completed treatment for malignant neoplasm: Secondary | ICD-10-CM | POA: Diagnosis not present

## 2023-11-19 DIAGNOSIS — D225 Melanocytic nevi of trunk: Secondary | ICD-10-CM | POA: Diagnosis not present

## 2023-11-19 DIAGNOSIS — Z1283 Encounter for screening for malignant neoplasm of skin: Secondary | ICD-10-CM | POA: Diagnosis not present

## 2023-11-21 ENCOUNTER — Encounter: Payer: Self-pay | Admitting: Family Medicine

## 2023-11-21 DIAGNOSIS — A498 Other bacterial infections of unspecified site: Secondary | ICD-10-CM | POA: Diagnosis not present

## 2023-12-01 NOTE — Telephone Encounter (Signed)
 Nurses I assume patient will be seen by infectious disease in the near future She can keep us  updated If she needs our assistance regarding talking with infectious disease or getting the appointment to let us  know Thanks-Dr. Glendia Fielding (Covering for Dr. Bluford this week he will be back next week)

## 2023-12-05 DIAGNOSIS — A498 Other bacterial infections of unspecified site: Secondary | ICD-10-CM | POA: Diagnosis not present

## 2023-12-17 ENCOUNTER — Encounter: Payer: Self-pay | Admitting: Family Medicine

## 2023-12-18 DIAGNOSIS — T8452XS Infection and inflammatory reaction due to internal left hip prosthesis, sequela: Secondary | ICD-10-CM | POA: Diagnosis not present

## 2023-12-18 DIAGNOSIS — A498 Other bacterial infections of unspecified site: Secondary | ICD-10-CM | POA: Diagnosis not present

## 2023-12-18 NOTE — Telephone Encounter (Signed)
 Called pharmacist at [facility] pharmacist and he states he will contact the insurance to inquire further on allowable dosages of the vaginal cream prescribed and will let us  know if further inquiries are needed.

## 2023-12-19 ENCOUNTER — Encounter: Payer: Self-pay | Admitting: Family Medicine

## 2023-12-29 DIAGNOSIS — T8452XA Infection and inflammatory reaction due to internal left hip prosthesis, initial encounter: Secondary | ICD-10-CM | POA: Diagnosis not present

## 2023-12-31 ENCOUNTER — Encounter: Payer: Self-pay | Admitting: Family Medicine

## 2023-12-31 ENCOUNTER — Other Ambulatory Visit: Payer: Self-pay | Admitting: Family Medicine

## 2024-01-01 ENCOUNTER — Other Ambulatory Visit: Payer: Self-pay | Admitting: Family Medicine

## 2024-01-01 MED ORDER — FUROSEMIDE 20 MG PO TABS: ORAL_TABLET | ORAL | 3 refills | Status: DC

## 2024-01-01 MED ORDER — FLUCONAZOLE 150 MG PO TABS: 150.0000 mg | ORAL_TABLET | ORAL | 0 refills | Status: DC

## 2024-01-02 ENCOUNTER — Other Ambulatory Visit: Payer: Self-pay | Admitting: Family Medicine

## 2024-01-02 MED ORDER — POTASSIUM CHLORIDE CRYS ER 10 MEQ PO TBCR: EXTENDED_RELEASE_TABLET | ORAL | 3 refills | Status: DC

## 2024-01-05 ENCOUNTER — Other Ambulatory Visit: Payer: Self-pay | Admitting: Family Medicine

## 2024-01-05 MED ORDER — FLUTICASONE PROPIONATE 50 MCG/ACT NA SUSP: 2.0000 | Freq: Every day | NASAL | 0 refills | Status: DC

## 2024-01-05 MED ORDER — FLUCONAZOLE 150 MG PO TABS: 150.0000 mg | ORAL_TABLET | ORAL | 0 refills | Status: DC

## 2024-01-05 MED ORDER — FUROSEMIDE 20 MG PO TABS: ORAL_TABLET | ORAL | 3 refills | Status: AC

## 2024-01-05 MED ORDER — POTASSIUM CHLORIDE CRYS ER 10 MEQ PO TBCR: EXTENDED_RELEASE_TABLET | ORAL | 3 refills | Status: AC

## 2024-01-05 NOTE — Telephone Encounter (Signed)
 Copied from CRM [id number]. Topic: Clinical - Medication Refill >> Jan 05, 2024  9:18 AM Everette C wrote: Medication: fluconazole  (DIFLUCAN ) 150 MG tablet [[id number]]  furosemide  (LASIX ) 20 MG tablet [[id number]]  potassium chloride  (KLOR-CON  M) 10 MEQ tablet [[id number]]  Has the patient contacted their pharmacy? Yes (Agent: If no, request that the patient contact the pharmacy for the refill. If patient does not wish to contact the pharmacy document the reason why and proceed with request.) (Agent: If yes, when and what did the pharmacy advise?)  This is the patient's preferred pharmacy:  [facility] [phone] - [place], [place] - [address] AT [facility] [address] [place] [address] Phone: [phone] Fax: [phone]  Is this the correct pharmacy for this prescription? Yes If no, delete pharmacy and type the correct one.   Has the prescription been filled recently? Yes  Is the patient out of the medication? No  Has the patient been seen for an appointment in the last year OR does the patient have an upcoming appointment? Yes  Can we respond through MyChart? No  Agent: Please be advised that Rx refills may take up to 3 business days. We ask that you follow-up with your pharmacy.

## 2024-01-27 DIAGNOSIS — T8452XA Infection and inflammatory reaction due to internal left hip prosthesis, initial encounter: Secondary | ICD-10-CM | POA: Diagnosis not present

## 2024-01-29 DIAGNOSIS — A498 Other bacterial infections of unspecified site: Secondary | ICD-10-CM | POA: Diagnosis not present

## 2024-01-29 DIAGNOSIS — T8452XS Infection and inflammatory reaction due to internal left hip prosthesis, sequela: Secondary | ICD-10-CM | POA: Diagnosis not present

## 2024-01-30 ENCOUNTER — Encounter (HOSPITAL_BASED_OUTPATIENT_CLINIC_OR_DEPARTMENT_OTHER): Payer: Self-pay

## 2024-01-30 DIAGNOSIS — T8452XA Infection and inflammatory reaction due to internal left hip prosthesis, initial encounter: Secondary | ICD-10-CM

## 2024-02-02 ENCOUNTER — Encounter (HOSPITAL_BASED_OUTPATIENT_CLINIC_OR_DEPARTMENT_OTHER): Payer: Self-pay

## 2024-02-02 DIAGNOSIS — T8452XA Infection and inflammatory reaction due to internal left hip prosthesis, initial encounter: Secondary | ICD-10-CM

## 2024-02-03 ENCOUNTER — Other Ambulatory Visit (HOSPITAL_BASED_OUTPATIENT_CLINIC_OR_DEPARTMENT_OTHER): Payer: Self-pay

## 2024-02-03 DIAGNOSIS — T8452XA Infection and inflammatory reaction due to internal left hip prosthesis, initial encounter: Secondary | ICD-10-CM

## 2024-02-04 ENCOUNTER — Ambulatory Visit (HOSPITAL_COMMUNITY)
Admission: RE | Admit: 2024-02-04 | Discharge: 2024-02-04 | Disposition: A | Discharge status: Home / Self Care | Source: Ambulatory Visit

## 2024-02-04 ENCOUNTER — Other Ambulatory Visit (HOSPITAL_BASED_OUTPATIENT_CLINIC_OR_DEPARTMENT_OTHER): Payer: Self-pay

## 2024-02-04 DIAGNOSIS — M47816 Spondylosis without myelopathy or radiculopathy, lumbar region: Secondary | ICD-10-CM | POA: Diagnosis not present

## 2024-02-04 DIAGNOSIS — T8452XA Infection and inflammatory reaction due to internal left hip prosthesis, initial encounter: Secondary | ICD-10-CM

## 2024-02-04 DIAGNOSIS — Z96642 Presence of left artificial hip joint: Secondary | ICD-10-CM | POA: Diagnosis not present

## 2024-02-09 ENCOUNTER — Ambulatory Visit (HOSPITAL_COMMUNITY)

## 2024-02-11 ENCOUNTER — Encounter: Payer: Self-pay | Admitting: Family Medicine

## 2024-03-12 ENCOUNTER — Encounter (HOSPITAL_COMMUNITY): Payer: Self-pay | Admitting: *Deleted

## 2024-03-22 DIAGNOSIS — A498 Other bacterial infections of unspecified site: Secondary | ICD-10-CM | POA: Diagnosis not present

## 2024-03-29 ENCOUNTER — Ambulatory Visit: Admitting: Family Medicine

## 2024-03-29 VITALS — BP 121/53 | HR 84 | Temp 98.6°F | Ht 60.0 in | Wt 238.1 lb

## 2024-03-29 DIAGNOSIS — J988 Other specified respiratory disorders: Secondary | ICD-10-CM | POA: Diagnosis not present

## 2024-03-29 MED ORDER — PROMETHAZINE-DM 6.25-15 MG/5ML PO SYRP: 5.0000 mL | ORAL_SOLUTION | Freq: Four times a day (QID) | ORAL | 0 refills | Status: DC | PRN

## 2024-03-29 MED ORDER — AMOXICILLIN-POT CLAVULANATE 875-125 MG PO TABS: 1.0000 | ORAL_TABLET | Freq: Two times a day (BID) | ORAL | 0 refills | Status: DC

## 2024-03-29 NOTE — Patient Instructions (Addendum)
Medication as prescribed. ° °Call with concerns. ° °Take care ° °Dr Nataliyah Packham °

## 2024-03-29 NOTE — Assessment & Plan Note (Signed)
 Acute febrile illness.  Given persistent symptoms and lack of improvement, placing on Augmentin.  Promethazine  DM for cough.

## 2024-03-29 NOTE — Progress Notes (Signed)
 Subjective:  Patient ID: Sharon [name], female    DOB: [birth year]  Age: 68 y.o. MRN: [id number]  CC:   Chief Complaint  Patient presents with   Nasal Congestion    Patient is here because she has been running a fever off and on, head congested, and cough since last Wednesday.    HPI:  68 year old female presents for ration of respiratory symptoms.  Symptoms since last Wednesday.  Reports cough, sore throat, nasal congestion.  Temperature last night of 100.5.  He is on chronic antibiotic therapy due to prior infection of left hip prosthesis.  Follows with infectious disease.  No relieving factors.  Feels poorly.  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, subsequent encounter 01/02/2021   Seasonal and perennial allergic rhinitis 03/06/2017   Moderate persistent asthma, uncomplicated 12/02/2016   Chronic urticaria 12/02/2016   Anaphylactic shock due to adverse food reaction 12/02/2016   Gastroesophageal reflux disease 08/08/2015   Status post right knee replacement 06/17/2011 06/17/2013   Status post left knee replacement 02/28/2007 06/17/2013   DDD (degenerative disc disease), lumbosacral 10/06/2012    Social Hx   Social History   Socioeconomic History   Marital status: Divorced    Spouse name: Not on file   Number of children: Not on file   Years of education: Not on file   Highest education level: Associate degree: academic program  Occupational History   Occupation: CHARITY FUNDRAISER at [employer]   Tobacco Use   Smoking status: Former    Current packs/day: 0.00    Average packs/day: 1 pack/day for 33.8 years (33.8 ttl pk-yrs)    Types: Cigarettes    Start date: 08/02/1972    Quit date: 05/27/2006    Years since quitting: 17.8   Smokeless tobacco:  Never   Tobacco comments:    No longer smoking since 2008  Vaping Use   Vaping status: Never Used  Substance and Sexual Activity   Alcohol use: Not Currently    Comment: occas.   Drug use: No   Sexual activity: Not Currently    Birth control/protection: Surgical  Other Topics Concern   Not on file  Social History Narrative   Not on file   Social Drivers of Health   Financial Resource Strain: Medium Risk (03/29/2024)   Overall Financial Resource Strain (CARDIA)    Difficulty of Paying Living Expenses: Somewhat hard  Food Insecurity: No Food Insecurity (03/29/2024)   Hunger Vital Sign    Worried About Running Out of Food in the Last Year: Never true    Ran Out of Food in the Last Year: Never true  Transportation Needs: No Transportation Needs (03/29/2024)   PRAPARE - Administrator, Civil Service (Medical): No    Lack of Transportation (Non-Medical): No  Physical Activity: Insufficiently Active (03/29/2024)   Exercise Vital Sign    Days of Exercise per Week: 2 days    Minutes of Exercise per Session: 10 min  Stress: No Stress Concern Present (03/29/2024)   [employer] of Occupational Health - Occupational Stress Questionnaire    Feeling of Stress: Not at all  Social Connections: Moderately Integrated (03/29/2024)   Social Connection and Isolation Panel    Frequency of Communication with Friends and Family: More than three times  a week    Frequency of Social Gatherings with Friends and Family: Twice a week    Attends Religious Services: More than 4 times per year    Active Member of [employer] or Organizations: Yes    Attends Engineer, Structural: More than 4 times per year    Marital Status: Divorced    Review of Systems Per HPI  Objective:  BP (!) 121/53 (BP Location: Left Arm, Patient Position: Sitting)   Pulse 84   Temp 98.6 F (37 C)   Ht 5' (1.524 m)   Wt 238 lb 2 oz (108 kg)   BMI 46.51 kg/m      03/29/2024    1:25 PM 11/17/2023   10:40  AM 11/14/2023   10:08 AM  BP/Weight  Systolic BP 121 136 --  Diastolic BP 53 72 --  Wt. (Lbs) 238.13 238 237  BMI 46.51 kg/m2 46.48 kg/m2 46.29 kg/m2    Physical Exam Vitals and nursing note reviewed.  Constitutional:      General: She is not in acute distress.    Appearance: Normal appearance.  HENT:     Head: Normocephalic and atraumatic.     Mouth/Throat:     Pharynx: Oropharynx is clear.  Cardiovascular:     Rate and Rhythm: Normal rate and regular rhythm.  Pulmonary:     Effort: Pulmonary effort is normal.     Breath sounds: Normal breath sounds. No wheezing or rales.  Neurological:     Mental Status: She is alert.     Lab Results  Component Value Date   WBC 7.9 11/17/2023   HGB 14.2 11/17/2023   HCT 43.7 11/17/2023   PLT 266 11/17/2023   GLUCOSE 91 11/17/2023   CHOL 192 11/17/2023   TRIG 74 11/17/2023   HDL 89 11/17/2023   LDLCALC 90 11/17/2023   ALT 13 11/17/2023   AST 23 11/17/2023   NA 140 11/17/2023   K 4.6 11/17/2023   CL 102 11/17/2023   CREATININE 0.97 11/17/2023   BUN 24 11/17/2023   CO2 24 11/17/2023   TSH 3.100 08/29/2020   INR 0.95 06/13/2011   HGBA1C 5.1 11/17/2023     Assessment & Plan:  Respiratory infection Assessment & Plan: Acute febrile illness.  Given persistent symptoms and lack of improvement, placing on Augmentin.  Promethazine  DM for cough.   Other orders -     Amoxicillin-Pot Clavulanate; Take 1 tablet by mouth 2 (two) times daily.  Dispense: 14 tablet; Refill: 0 -     Promethazine -DM; Take 5 mLs by mouth 4 (four) times daily as needed for cough.  Dispense: 118 mL; Refill: 0    Follow-up:  Return if symptoms worsen or fail to improve.  Jacqulyn Ahle DO [facility]

## 2024-03-31 ENCOUNTER — Encounter: Payer: Self-pay | Admitting: Family Medicine

## 2024-04-08 ENCOUNTER — Other Ambulatory Visit: Payer: Self-pay | Admitting: Family Medicine

## 2024-04-15 DIAGNOSIS — B379 Candidiasis, unspecified: Secondary | ICD-10-CM | POA: Diagnosis not present

## 2024-04-15 DIAGNOSIS — Z8619 Personal history of other infectious and parasitic diseases: Secondary | ICD-10-CM | POA: Diagnosis not present

## 2024-04-15 DIAGNOSIS — Z96642 Presence of left artificial hip joint: Secondary | ICD-10-CM | POA: Diagnosis not present

## 2024-04-15 DIAGNOSIS — Z792 Long term (current) use of antibiotics: Secondary | ICD-10-CM | POA: Diagnosis not present

## 2024-05-18 ENCOUNTER — Ambulatory Visit: Admitting: Family Medicine

## 2024-05-18 ENCOUNTER — Encounter: Payer: Self-pay | Admitting: Family Medicine

## 2024-05-18 ENCOUNTER — Ambulatory Visit: Attending: Family Medicine

## 2024-05-18 VITALS — BP 133/74 | HR 71 | Temp 98.1°F | Ht 60.0 in | Wt 239.6 lb

## 2024-05-18 DIAGNOSIS — T8452XS Infection and inflammatory reaction due to internal left hip prosthesis, sequela: Secondary | ICD-10-CM | POA: Diagnosis not present

## 2024-05-18 DIAGNOSIS — R002 Palpitations: Secondary | ICD-10-CM | POA: Diagnosis not present

## 2024-05-18 DIAGNOSIS — Z6841 Body Mass Index (BMI) 40.0 and over, adult: Secondary | ICD-10-CM

## 2024-05-18 DIAGNOSIS — I10 Essential (primary) hypertension: Secondary | ICD-10-CM

## 2024-05-18 DIAGNOSIS — T8452XD Infection and inflammatory reaction due to internal left hip prosthesis, subsequent encounter: Secondary | ICD-10-CM

## 2024-05-18 MED ORDER — FLUCONAZOLE 150 MG PO TABS: 150.0000 mg | ORAL_TABLET | Freq: Once | ORAL | 0 refills | Status: AC

## 2024-05-18 NOTE — Progress Notes (Unsigned)
 EP to read.

## 2024-05-18 NOTE — Patient Instructions (Signed)
 Medication sent in.  Monitor ordered.  Follow up in 6 months.

## 2024-05-23 DIAGNOSIS — R002 Palpitations: Secondary | ICD-10-CM | POA: Insufficient documentation

## 2024-05-23 NOTE — Progress Notes (Signed)
 "  Subjective:  Patient ID: Sharon [name], female    DOB: 06/18/1955  Age: 68 y.o. MRN: [id number]  CC:   Chief Complaint  Patient presents with   Medical Management of Chronic Issues    6 month f/u Migraines and asthma  Would like a refill of Diflucan      HPI: 68 year old female presents for follow up.  Requesting refill on Diflucan . Often has issues with yeast due to chronic antibiotic use (for infection associated hip prosthesis).  HTN stable.  Experiencing palpitations. Has had these before. No chest pain or increasing SOB.   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, subsequent encounter 01/02/2021   Seasonal and perennial allergic rhinitis 03/06/2017   Moderate persistent asthma, uncomplicated 12/02/2016   Chronic urticaria 12/02/2016   Anaphylactic shock due to adverse food reaction 12/02/2016   Gastroesophageal reflux disease 08/08/2015   Status post right knee replacement 06/17/2011 06/17/2013   Status post left knee replacement 02/28/2007 06/17/2013   DDD (degenerative disc disease), lumbosacral 10/06/2012    Social Hx   Social History   Socioeconomic History   Marital status: Divorced    Spouse name: Not on file   Number of children: Not on file   Years of education: Not on file   Highest education level: Associate degree: academic program  Occupational History   Occupation: CHARITY FUNDRAISER at [employer]   Tobacco Use   Smoking status: Former    Current packs/day: 0.00    Average packs/day: 1 pack/day for 33.8 years (33.8 ttl pk-yrs)    Types: Cigarettes    Start date: 08/02/1972    Quit date: 05/27/2006    Years since quitting: 18.0   Smokeless tobacco: Never   Tobacco comments:    No longer smoking since 2008  Vaping Use   Vaping status:  Never Used  Substance and Sexual Activity   Alcohol use: Not Currently    Comment: occas.   Drug use: No   Sexual activity: Not Currently    Birth control/protection: Surgical  Other Topics Concern   Not on file  Social History Narrative   Not on file   Social Drivers of Health   Tobacco Use: Medium Risk (05/18/2024)   Patient History    Smoking Tobacco Use: Former    Smokeless Tobacco Use: Never    Passive Exposure: Not on file  Financial Resource Strain: Medium Risk (03/29/2024)   Overall Financial Resource Strain (CARDIA)    Difficulty of Paying Living Expenses: Somewhat hard  Food Insecurity: No Food Insecurity (03/29/2024)   Epic    Worried About Programme Researcher, Broadcasting/film/video in the Last Year: Never true    Ran Out of Food in the Last Year: Never true  Transportation Needs: No Transportation Needs (03/29/2024)   Epic    Lack of Transportation (Medical): No    Lack of Transportation (Non-Medical): No  Physical Activity: Insufficiently Active (03/29/2024)   Exercise Vital Sign    Days of Exercise per Week: 2 days    Minutes of Exercise per Session: 10 min  Stress: No Stress Concern Present (03/29/2024)   [employer] of Occupational Health - Occupational Stress Questionnaire    Feeling of Stress: Not at all  Social Connections: Moderately Integrated (03/29/2024)   Social Connection and  Isolation Panel    Frequency of Communication with Friends and Family: More than three times a week    Frequency of Social Gatherings with Friends and Family: Twice a week    Attends Religious Services: More than 4 times per year    Active Member of Clubs or Organizations: Yes    Attends Banker Meetings: More than 4 times per year    Marital Status: Divorced  Depression (PHQ2-9): Low Risk (03/29/2024)   Depression (PHQ2-9)    PHQ-2 Score: 0  Alcohol Screen: Low Risk (03/29/2024)   Alcohol Screen    Last Alcohol Screening Score (AUDIT): 1  Housing: Low Risk (03/29/2024)   Epic     Unable to Pay for Housing in the Last Year: No    Number of Times Moved in the Last Year: 0    Homeless in the Last Year: No  Utilities: Not At Risk (11/14/2023)   Epic    Threatened with loss of utilities: No  Health Literacy: Adequate Health Literacy (11/14/2023)   B1300 Health Literacy    Frequency of need for help with medical instructions: Never    Review of Systems Per HPI  Objective:  BP 133/74   Pulse 71   Temp 98.1 F (36.7 C)   Ht 5' (1.524 m)   Wt 239 lb 9.6 oz (108.7 kg)   SpO2 95%   BMI 46.79 kg/m      05/18/2024   10:07 AM 03/29/2024    1:25 PM 11/17/2023   10:40 AM  BP/Weight  Systolic BP 133 121 136  Diastolic BP 74 53 72  Wt. (Lbs) 239.6 238.13 238  BMI 46.79 kg/m2 46.51 kg/m2 46.48 kg/m2    Physical Exam Vitals and nursing note reviewed.  Constitutional:      General: She is not in acute distress.    Appearance: Normal appearance. She is obese.  HENT:     Head: Normocephalic and atraumatic.  Eyes:     General:        Right eye: No discharge.        Left eye: No discharge.     Conjunctiva/sclera: Conjunctivae normal.  Cardiovascular:     Rate and Rhythm: Normal rate and regular rhythm.  Pulmonary:     Effort: Pulmonary effort is normal.     Breath sounds: Normal breath sounds. No wheezing, rhonchi or rales.  Neurological:     Mental Status: She is alert.  Psychiatric:        Mood and Affect: Mood normal.        Behavior: Behavior normal.     Lab Results  Component Value Date   WBC 7.9 11/17/2023   HGB 14.2 11/17/2023   HCT 43.7 11/17/2023   PLT 266 11/17/2023   GLUCOSE 91 11/17/2023   CHOL 192 11/17/2023   TRIG 74 11/17/2023   HDL 89 11/17/2023   LDLCALC 90 11/17/2023   ALT 13 11/17/2023   AST 23 11/17/2023   NA 140 11/17/2023   K 4.6 11/17/2023   CL 102 11/17/2023   CREATININE 0.97 11/17/2023   BUN 24 11/17/2023   CO2 24 11/17/2023   TSH 3.100 08/29/2020   INR 0.95 06/13/2011   HGBA1C 5.1 11/17/2023     Assessment &  Plan:  Essential hypertension Assessment & Plan: Stable. Continue Metoprolol  and Lasix .   Palpitations Assessment & Plan: Zio patch ordered for further evaluation.  Orders: -     LONG TERM MONITOR (3-14 DAYS)  Morbid obesity (  HCC) Assessment & Plan: Patient staying active. Will continue to monitor.   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 Follows with ID.   Other orders -     Fluconazole ; Take 1 tablet (150 mg total) by mouth once for 1 dose. Repeat dose in 72 hours.  Dispense: 20 tablet; Refill: 0    Follow-up:  6 months  Liesa Tsan Bluford DO [address] [facility] "

## 2024-05-23 NOTE — Assessment & Plan Note (Signed)
 Stable. Continue Metoprolol  and Lasix .

## 2024-05-23 NOTE — Assessment & Plan Note (Signed)
 Patient staying active. Will continue to monitor.

## 2024-05-23 NOTE — Assessment & Plan Note (Signed)
 Ziopatch ordered for further evaluation

## 2024-05-23 NOTE — Assessment & Plan Note (Signed)
Stable. Follows with ID. 

## 2024-05-24 ENCOUNTER — Encounter: Payer: Self-pay | Admitting: Family Medicine

## 2024-05-24 ENCOUNTER — Other Ambulatory Visit: Payer: Self-pay | Admitting: Family Medicine

## 2024-05-24 MED ORDER — CELEBREX 100 MG PO CAPS: 100.0000 mg | ORAL_CAPSULE | Freq: Two times a day (BID) | ORAL | 1 refills | Status: DC

## 2024-05-28 ENCOUNTER — Telehealth: Payer: Self-pay | Admitting: Family Medicine

## 2024-05-28 NOTE — Telephone Encounter (Signed)
 Refill on celebrex  100 mg capsules  [facility]

## 2024-05-29 ENCOUNTER — Other Ambulatory Visit: Payer: Self-pay | Admitting: Nurse Practitioner

## 2024-05-29 MED ORDER — CELEBREX 100 MG PO CAPS: 100.0000 mg | ORAL_CAPSULE | Freq: Two times a day (BID) | ORAL | 0 refills | Status: AC

## 2024-06-03 ENCOUNTER — Encounter: Payer: Self-pay | Admitting: Family Medicine

## 2024-06-11 ENCOUNTER — Other Ambulatory Visit: Payer: Self-pay | Admitting: Family Medicine

## 2024-06-11 ENCOUNTER — Encounter: Payer: Self-pay | Admitting: Family Medicine

## 2024-06-11 DIAGNOSIS — R002 Palpitations: Secondary | ICD-10-CM

## 2024-11-16 ENCOUNTER — Ambulatory Visit: Admitting: Family Medicine

## 2024-11-19 ENCOUNTER — Ambulatory Visit
